# Patient Record
Sex: Male | Born: 1935 | Race: Black or African American | Hispanic: No | Smoking: Former smoker
Health system: Southern US, Community
[De-identification: ages and names within clinical notes are randomized; demographics above are authoritative.]

## PROBLEM LIST (undated history)

## (undated) DIAGNOSIS — G4730 Sleep apnea, unspecified: Secondary | ICD-10-CM

## (undated) DIAGNOSIS — N400 Enlarged prostate without lower urinary tract symptoms: Secondary | ICD-10-CM

## (undated) DIAGNOSIS — R09A2 Foreign body sensation, throat: Secondary | ICD-10-CM

## (undated) DIAGNOSIS — K5909 Other constipation: Secondary | ICD-10-CM

## (undated) DIAGNOSIS — I10 Essential (primary) hypertension: Secondary | ICD-10-CM

## (undated) DIAGNOSIS — I480 Paroxysmal atrial fibrillation: Secondary | ICD-10-CM

## (undated) DIAGNOSIS — N62 Hypertrophy of breast: Secondary | ICD-10-CM

## (undated) DIAGNOSIS — R0989 Other specified symptoms and signs involving the circulatory and respiratory systems: Secondary | ICD-10-CM

## (undated) DIAGNOSIS — N189 Chronic kidney disease, unspecified: Secondary | ICD-10-CM

## (undated) DIAGNOSIS — E669 Obesity, unspecified: Secondary | ICD-10-CM

## (undated) DIAGNOSIS — E785 Hyperlipidemia, unspecified: Secondary | ICD-10-CM

## (undated) DIAGNOSIS — I484 Atypical atrial flutter: Secondary | ICD-10-CM

## (undated) DIAGNOSIS — D649 Anemia, unspecified: Secondary | ICD-10-CM

## (undated) DIAGNOSIS — I495 Sick sinus syndrome: Secondary | ICD-10-CM

## (undated) DIAGNOSIS — K219 Gastro-esophageal reflux disease without esophagitis: Secondary | ICD-10-CM

## (undated) DIAGNOSIS — K3184 Gastroparesis: Secondary | ICD-10-CM

## (undated) DIAGNOSIS — R198 Other specified symptoms and signs involving the digestive system and abdomen: Secondary | ICD-10-CM

## (undated) HISTORY — DX: Benign prostatic hyperplasia without lower urinary tract symptoms: N40.0

## (undated) HISTORY — DX: Hyperlipidemia, unspecified: E78.5

## (undated) HISTORY — DX: Obesity, unspecified: E66.9

## (undated) HISTORY — DX: Sleep apnea, unspecified: G47.30

## (undated) HISTORY — DX: Gastroparesis: K31.84

## (undated) HISTORY — DX: Hypertrophy of breast: N62

## (undated) HISTORY — DX: Sick sinus syndrome: I49.5

## (undated) HISTORY — DX: Anemia, unspecified: D64.9

## (undated) HISTORY — PX: TUMOR REMOVAL: SHX12

## (undated) HISTORY — DX: Essential (primary) hypertension: I10

## (undated) HISTORY — PX: ROTATOR CUFF REPAIR: SHX139

## (undated) HISTORY — DX: Other constipation: K59.09

## (undated) HISTORY — DX: Atypical atrial flutter: I48.4

## (undated) HISTORY — DX: Gastro-esophageal reflux disease without esophagitis: K21.9

## (undated) HISTORY — DX: Paroxysmal atrial fibrillation: I48.0

## (undated) HISTORY — PX: CATARACT EXTRACTION: SUR2

---

## 2001-07-24 HISTORY — PX: CARDIAC CATHETERIZATION: SHX172

## 2003-07-30 ENCOUNTER — Emergency Department (HOSPITAL_COMMUNITY): Admission: EM | Admit: 2003-07-30 | Discharge: 2003-07-30 | Payer: Self-pay | Admitting: Emergency Medicine

## 2003-08-15 ENCOUNTER — Emergency Department (HOSPITAL_COMMUNITY): Admission: EM | Admit: 2003-08-15 | Discharge: 2003-08-15 | Payer: Self-pay | Admitting: Emergency Medicine

## 2004-01-20 ENCOUNTER — Emergency Department (HOSPITAL_COMMUNITY): Admission: EM | Admit: 2004-01-20 | Discharge: 2004-01-21 | Payer: Self-pay | Admitting: Emergency Medicine

## 2004-11-17 ENCOUNTER — Ambulatory Visit: Payer: Self-pay | Admitting: Internal Medicine

## 2004-11-22 ENCOUNTER — Ambulatory Visit: Payer: Self-pay | Admitting: Family Medicine

## 2004-11-29 ENCOUNTER — Ambulatory Visit: Payer: Self-pay | Admitting: Internal Medicine

## 2004-12-13 ENCOUNTER — Ambulatory Visit: Payer: Self-pay | Admitting: Internal Medicine

## 2005-01-02 ENCOUNTER — Ambulatory Visit: Payer: Self-pay | Admitting: Internal Medicine

## 2005-01-10 ENCOUNTER — Ambulatory Visit: Payer: Self-pay | Admitting: Internal Medicine

## 2005-01-16 ENCOUNTER — Ambulatory Visit: Payer: Self-pay | Admitting: Internal Medicine

## 2005-01-18 ENCOUNTER — Ambulatory Visit: Payer: Self-pay | Admitting: Internal Medicine

## 2005-01-20 ENCOUNTER — Ambulatory Visit: Payer: Self-pay | Admitting: Family Medicine

## 2005-01-23 ENCOUNTER — Ambulatory Visit: Payer: Self-pay | Admitting: Internal Medicine

## 2005-01-26 ENCOUNTER — Ambulatory Visit: Payer: Self-pay | Admitting: Internal Medicine

## 2005-01-31 ENCOUNTER — Ambulatory Visit: Payer: Self-pay

## 2005-02-02 ENCOUNTER — Ambulatory Visit: Payer: Self-pay | Admitting: Internal Medicine

## 2005-02-06 ENCOUNTER — Ambulatory Visit: Payer: Self-pay

## 2005-02-06 ENCOUNTER — Ambulatory Visit: Payer: Self-pay | Admitting: Internal Medicine

## 2005-03-02 ENCOUNTER — Ambulatory Visit: Payer: Self-pay | Admitting: Internal Medicine

## 2005-03-06 ENCOUNTER — Ambulatory Visit: Payer: Self-pay | Admitting: Internal Medicine

## 2005-03-13 ENCOUNTER — Ambulatory Visit: Payer: Self-pay | Admitting: Internal Medicine

## 2005-03-17 ENCOUNTER — Ambulatory Visit: Payer: Self-pay | Admitting: Internal Medicine

## 2005-04-03 ENCOUNTER — Ambulatory Visit: Payer: Self-pay | Admitting: Internal Medicine

## 2005-04-05 ENCOUNTER — Ambulatory Visit: Payer: Self-pay | Admitting: Internal Medicine

## 2005-04-06 ENCOUNTER — Ambulatory Visit: Payer: Self-pay | Admitting: Internal Medicine

## 2005-04-10 ENCOUNTER — Ambulatory Visit: Payer: Self-pay | Admitting: Internal Medicine

## 2005-04-10 ENCOUNTER — Ambulatory Visit: Payer: Self-pay | Admitting: Cardiology

## 2005-04-11 ENCOUNTER — Ambulatory Visit: Payer: Self-pay | Admitting: Internal Medicine

## 2005-04-13 ENCOUNTER — Ambulatory Visit: Payer: Self-pay | Admitting: Internal Medicine

## 2005-04-18 ENCOUNTER — Ambulatory Visit: Payer: Self-pay | Admitting: Gastroenterology

## 2005-04-19 ENCOUNTER — Ambulatory Visit: Payer: Self-pay

## 2005-04-21 ENCOUNTER — Ambulatory Visit (HOSPITAL_COMMUNITY): Admission: RE | Admit: 2005-04-21 | Discharge: 2005-04-21 | Payer: Self-pay | Admitting: Gastroenterology

## 2005-04-27 ENCOUNTER — Ambulatory Visit: Payer: Self-pay | Admitting: Internal Medicine

## 2005-05-23 ENCOUNTER — Ambulatory Visit: Payer: Self-pay | Admitting: Gastroenterology

## 2005-05-29 ENCOUNTER — Ambulatory Visit: Payer: Self-pay | Admitting: Internal Medicine

## 2005-06-12 ENCOUNTER — Ambulatory Visit: Payer: Self-pay | Admitting: Internal Medicine

## 2005-06-22 ENCOUNTER — Ambulatory Visit: Payer: Self-pay | Admitting: Internal Medicine

## 2005-06-28 ENCOUNTER — Ambulatory Visit: Payer: Self-pay | Admitting: *Deleted

## 2005-07-05 ENCOUNTER — Ambulatory Visit: Payer: Self-pay | Admitting: Cardiology

## 2005-07-21 ENCOUNTER — Ambulatory Visit: Payer: Self-pay | Admitting: Internal Medicine

## 2005-07-26 ENCOUNTER — Emergency Department (HOSPITAL_COMMUNITY): Admission: EM | Admit: 2005-07-26 | Discharge: 2005-07-26 | Payer: Self-pay | Admitting: Emergency Medicine

## 2005-07-28 ENCOUNTER — Ambulatory Visit: Payer: Self-pay | Admitting: Internal Medicine

## 2005-08-02 ENCOUNTER — Ambulatory Visit: Payer: Self-pay | Admitting: *Deleted

## 2005-08-30 ENCOUNTER — Ambulatory Visit: Payer: Self-pay | Admitting: Cardiology

## 2005-09-27 ENCOUNTER — Ambulatory Visit: Payer: Self-pay | Admitting: Internal Medicine

## 2005-10-09 ENCOUNTER — Ambulatory Visit: Payer: Self-pay | Admitting: Gastroenterology

## 2005-10-11 ENCOUNTER — Ambulatory Visit: Payer: Self-pay | Admitting: Cardiology

## 2005-10-19 ENCOUNTER — Ambulatory Visit: Payer: Self-pay | Admitting: Internal Medicine

## 2005-11-01 ENCOUNTER — Ambulatory Visit: Payer: Self-pay | Admitting: *Deleted

## 2005-11-02 ENCOUNTER — Ambulatory Visit: Payer: Self-pay | Admitting: Internal Medicine

## 2005-11-07 ENCOUNTER — Ambulatory Visit: Payer: Self-pay | Admitting: Internal Medicine

## 2005-11-29 ENCOUNTER — Ambulatory Visit: Payer: Self-pay | Admitting: Cardiology

## 2005-12-04 ENCOUNTER — Ambulatory Visit: Payer: Self-pay

## 2005-12-13 ENCOUNTER — Ambulatory Visit: Payer: Self-pay | Admitting: Cardiology

## 2006-01-10 ENCOUNTER — Ambulatory Visit: Payer: Self-pay | Admitting: *Deleted

## 2006-01-16 ENCOUNTER — Ambulatory Visit: Payer: Self-pay | Admitting: Internal Medicine

## 2006-02-02 ENCOUNTER — Ambulatory Visit: Payer: Self-pay | Admitting: Cardiology

## 2006-02-05 ENCOUNTER — Encounter: Admission: RE | Admit: 2006-02-05 | Discharge: 2006-03-06 | Payer: Self-pay | Admitting: Internal Medicine

## 2006-02-06 ENCOUNTER — Ambulatory Visit: Payer: Self-pay | Admitting: Internal Medicine

## 2006-02-08 ENCOUNTER — Ambulatory Visit: Payer: Self-pay | Admitting: Internal Medicine

## 2006-02-14 ENCOUNTER — Ambulatory Visit: Payer: Self-pay | Admitting: Internal Medicine

## 2006-02-16 ENCOUNTER — Ambulatory Visit: Payer: Self-pay | Admitting: Cardiology

## 2006-03-02 ENCOUNTER — Ambulatory Visit: Payer: Self-pay | Admitting: Cardiology

## 2006-03-07 ENCOUNTER — Encounter: Admission: RE | Admit: 2006-03-07 | Discharge: 2006-04-03 | Payer: Self-pay | Admitting: Internal Medicine

## 2006-03-13 ENCOUNTER — Ambulatory Visit: Payer: Self-pay | Admitting: Cardiology

## 2006-03-27 ENCOUNTER — Ambulatory Visit: Payer: Self-pay | Admitting: Cardiovascular Disease

## 2006-04-04 ENCOUNTER — Encounter: Admission: RE | Admit: 2006-04-04 | Discharge: 2006-05-22 | Payer: Self-pay | Admitting: Internal Medicine

## 2006-04-10 ENCOUNTER — Ambulatory Visit: Payer: Self-pay | Admitting: Cardiology

## 2006-04-17 ENCOUNTER — Ambulatory Visit: Payer: Self-pay | Admitting: Internal Medicine

## 2006-04-26 ENCOUNTER — Ambulatory Visit: Payer: Self-pay | Admitting: Internal Medicine

## 2006-05-01 ENCOUNTER — Ambulatory Visit: Payer: Self-pay | Admitting: Internal Medicine

## 2006-05-14 ENCOUNTER — Ambulatory Visit: Payer: Self-pay | Admitting: Internal Medicine

## 2006-05-22 ENCOUNTER — Ambulatory Visit: Payer: Self-pay | Admitting: Cardiology

## 2006-05-31 ENCOUNTER — Encounter: Payer: Self-pay | Admitting: Cardiology

## 2006-05-31 ENCOUNTER — Ambulatory Visit: Payer: Self-pay

## 2006-06-05 ENCOUNTER — Ambulatory Visit: Payer: Self-pay | Admitting: *Deleted

## 2006-07-03 ENCOUNTER — Ambulatory Visit: Payer: Self-pay | Admitting: Cardiovascular Disease

## 2006-08-03 ENCOUNTER — Ambulatory Visit: Payer: Self-pay | Admitting: Internal Medicine

## 2006-08-06 ENCOUNTER — Ambulatory Visit: Payer: Self-pay | Admitting: Cardiology

## 2006-08-12 ENCOUNTER — Inpatient Hospital Stay (HOSPITAL_COMMUNITY): Admission: EM | Admit: 2006-08-12 | Discharge: 2006-08-18 | Payer: Self-pay | Admitting: Emergency Medicine

## 2006-08-13 ENCOUNTER — Ambulatory Visit: Payer: Self-pay | Admitting: Internal Medicine

## 2006-08-16 DIAGNOSIS — K219 Gastro-esophageal reflux disease without esophagitis: Secondary | ICD-10-CM

## 2006-08-16 DIAGNOSIS — K3184 Gastroparesis: Secondary | ICD-10-CM

## 2006-08-16 DIAGNOSIS — J45909 Unspecified asthma, uncomplicated: Secondary | ICD-10-CM | POA: Insufficient documentation

## 2006-08-16 DIAGNOSIS — R945 Abnormal results of liver function studies: Secondary | ICD-10-CM | POA: Insufficient documentation

## 2006-08-20 ENCOUNTER — Ambulatory Visit: Payer: Self-pay | Admitting: Cardiology

## 2006-08-22 ENCOUNTER — Encounter: Admission: RE | Admit: 2006-08-22 | Discharge: 2006-08-22 | Payer: Self-pay | Admitting: Internal Medicine

## 2006-08-22 ENCOUNTER — Ambulatory Visit: Payer: Self-pay | Admitting: Internal Medicine

## 2006-08-29 ENCOUNTER — Ambulatory Visit: Payer: Self-pay | Admitting: Internal Medicine

## 2006-08-31 ENCOUNTER — Ambulatory Visit: Payer: Self-pay | Admitting: Internal Medicine

## 2006-09-12 ENCOUNTER — Ambulatory Visit: Payer: Self-pay | Admitting: Internal Medicine

## 2006-10-03 ENCOUNTER — Ambulatory Visit: Payer: Self-pay | Admitting: Internal Medicine

## 2006-10-22 ENCOUNTER — Ambulatory Visit: Payer: Self-pay | Admitting: Internal Medicine

## 2006-11-07 ENCOUNTER — Inpatient Hospital Stay (HOSPITAL_COMMUNITY): Admission: AD | Admit: 2006-11-07 | Discharge: 2006-11-20 | Payer: Self-pay | Admitting: Internal Medicine

## 2006-11-07 ENCOUNTER — Ambulatory Visit: Payer: Self-pay | Admitting: Internal Medicine

## 2006-11-08 ENCOUNTER — Ambulatory Visit: Payer: Self-pay | Admitting: Internal Medicine

## 2006-11-09 ENCOUNTER — Ambulatory Visit: Payer: Self-pay | Admitting: Cardiology

## 2006-11-09 ENCOUNTER — Encounter: Payer: Self-pay | Admitting: Cardiology

## 2006-11-16 ENCOUNTER — Encounter: Payer: Self-pay | Admitting: Vascular Surgery

## 2006-11-16 ENCOUNTER — Ambulatory Visit: Payer: Self-pay | Admitting: Vascular Surgery

## 2006-11-20 ENCOUNTER — Ambulatory Visit: Payer: Self-pay | Admitting: Gastroenterology

## 2006-12-11 ENCOUNTER — Encounter: Admission: RE | Admit: 2006-12-11 | Discharge: 2006-12-11 | Payer: Self-pay | Admitting: Internal Medicine

## 2007-01-14 ENCOUNTER — Ambulatory Visit: Payer: Self-pay | Admitting: Internal Medicine

## 2007-01-14 ENCOUNTER — Inpatient Hospital Stay (HOSPITAL_COMMUNITY): Admission: EM | Admit: 2007-01-14 | Discharge: 2007-01-17 | Payer: Self-pay | Admitting: Emergency Medicine

## 2007-04-03 ENCOUNTER — Encounter: Admission: RE | Admit: 2007-04-03 | Discharge: 2007-04-03 | Payer: Self-pay | Admitting: Gastroenterology

## 2007-08-22 ENCOUNTER — Encounter: Payer: Self-pay | Admitting: Gastroenterology

## 2007-08-22 ENCOUNTER — Ambulatory Visit (HOSPITAL_COMMUNITY): Admission: RE | Admit: 2007-08-22 | Discharge: 2007-08-22 | Payer: Self-pay | Admitting: Gastroenterology

## 2007-09-05 ENCOUNTER — Ambulatory Visit (HOSPITAL_COMMUNITY): Admission: RE | Admit: 2007-09-05 | Discharge: 2007-09-05 | Payer: Self-pay | Admitting: Internal Medicine

## 2007-09-17 ENCOUNTER — Ambulatory Visit (HOSPITAL_COMMUNITY): Admission: RE | Admit: 2007-09-17 | Discharge: 2007-09-17 | Payer: Self-pay | Admitting: Gastroenterology

## 2007-12-11 ENCOUNTER — Encounter: Admission: RE | Admit: 2007-12-11 | Discharge: 2007-12-11 | Payer: Self-pay | Admitting: Gastroenterology

## 2008-02-28 ENCOUNTER — Encounter: Admission: RE | Admit: 2008-02-28 | Discharge: 2008-02-28 | Payer: Self-pay | Admitting: Gastroenterology

## 2008-03-19 ENCOUNTER — Encounter: Admission: RE | Admit: 2008-03-19 | Discharge: 2008-03-19 | Payer: Self-pay | Admitting: Gastroenterology

## 2008-06-05 ENCOUNTER — Ambulatory Visit (HOSPITAL_COMMUNITY): Admission: RE | Admit: 2008-06-05 | Discharge: 2008-06-05 | Payer: Self-pay | Admitting: Gastroenterology

## 2008-12-15 ENCOUNTER — Encounter: Admission: RE | Admit: 2008-12-15 | Discharge: 2008-12-15 | Payer: Self-pay | Admitting: Geriatric Medicine

## 2009-02-16 ENCOUNTER — Ambulatory Visit: Payer: Self-pay | Admitting: Vascular Surgery

## 2009-11-26 ENCOUNTER — Ambulatory Visit: Payer: Self-pay | Admitting: Internal Medicine

## 2009-11-26 DIAGNOSIS — D649 Anemia, unspecified: Secondary | ICD-10-CM

## 2009-11-26 DIAGNOSIS — I10 Essential (primary) hypertension: Secondary | ICD-10-CM

## 2009-11-26 DIAGNOSIS — I4891 Unspecified atrial fibrillation: Secondary | ICD-10-CM | POA: Insufficient documentation

## 2009-11-26 DIAGNOSIS — R32 Unspecified urinary incontinence: Secondary | ICD-10-CM | POA: Insufficient documentation

## 2009-11-26 DIAGNOSIS — E785 Hyperlipidemia, unspecified: Secondary | ICD-10-CM | POA: Insufficient documentation

## 2009-11-26 DIAGNOSIS — E119 Type 2 diabetes mellitus without complications: Secondary | ICD-10-CM | POA: Insufficient documentation

## 2009-11-26 DIAGNOSIS — J309 Allergic rhinitis, unspecified: Secondary | ICD-10-CM | POA: Insufficient documentation

## 2009-11-27 ENCOUNTER — Telehealth: Payer: Self-pay | Admitting: Family Medicine

## 2009-11-29 ENCOUNTER — Telehealth: Payer: Self-pay | Admitting: Internal Medicine

## 2009-11-29 LAB — CONVERTED CEMR LAB
CO2: 24 meq/L (ref 19–32)
Calcium: 9 mg/dL (ref 8.4–10.5)
Glucose, Bld: 179 mg/dL — ABNORMAL HIGH (ref 70–99)
Sodium: 138 meq/L (ref 135–145)

## 2009-12-01 ENCOUNTER — Encounter: Payer: Self-pay | Admitting: Internal Medicine

## 2009-12-03 ENCOUNTER — Encounter: Payer: Self-pay | Admitting: Internal Medicine

## 2009-12-08 ENCOUNTER — Encounter: Payer: Self-pay | Admitting: Internal Medicine

## 2009-12-10 ENCOUNTER — Telehealth: Payer: Self-pay | Admitting: Internal Medicine

## 2009-12-10 ENCOUNTER — Encounter: Payer: Self-pay | Admitting: Internal Medicine

## 2009-12-10 LAB — CONVERTED CEMR LAB
INR: 1.54
Prothrombin Time: 19.5 s

## 2009-12-27 ENCOUNTER — Telehealth (INDEPENDENT_AMBULATORY_CARE_PROVIDER_SITE_OTHER): Payer: Self-pay | Admitting: *Deleted

## 2009-12-27 ENCOUNTER — Encounter (INDEPENDENT_AMBULATORY_CARE_PROVIDER_SITE_OTHER): Payer: Self-pay | Admitting: *Deleted

## 2009-12-27 LAB — CONVERTED CEMR LAB: Prothrombin Time: 27.8 s

## 2009-12-28 ENCOUNTER — Telehealth: Payer: Self-pay | Admitting: Internal Medicine

## 2009-12-31 ENCOUNTER — Encounter: Payer: Self-pay | Admitting: Internal Medicine

## 2010-01-05 ENCOUNTER — Encounter: Payer: Self-pay | Admitting: Internal Medicine

## 2010-01-11 ENCOUNTER — Ambulatory Visit: Payer: Self-pay | Admitting: Internal Medicine

## 2010-01-13 LAB — CONVERTED CEMR LAB
CO2: 29 meq/L (ref 19–32)
Calcium: 9 mg/dL (ref 8.4–10.5)
GFR calc non Af Amer: 54.8 mL/min (ref >60)
HDL: 37.3 mg/dL — ABNORMAL LOW (ref >39.00)
Sodium: 142 meq/L (ref 135–145)
Triglycerides: 247 mg/dL — ABNORMAL HIGH (ref 0.0–149.0)

## 2010-01-26 ENCOUNTER — Encounter: Payer: Self-pay | Admitting: Internal Medicine

## 2010-02-01 ENCOUNTER — Encounter: Payer: Self-pay | Admitting: Internal Medicine

## 2010-02-02 ENCOUNTER — Encounter: Payer: Self-pay | Admitting: Internal Medicine

## 2010-02-17 ENCOUNTER — Encounter: Payer: Self-pay | Admitting: Family Medicine

## 2010-02-28 ENCOUNTER — Telehealth: Payer: Self-pay | Admitting: Internal Medicine

## 2010-03-01 ENCOUNTER — Ambulatory Visit: Payer: Self-pay | Admitting: Internal Medicine

## 2010-03-04 LAB — CONVERTED CEMR LAB
ALT: 23 units/L (ref 0–53)
Hgb A1c MFr Bld: 6.8 % — ABNORMAL HIGH (ref 4.6–6.5)

## 2010-03-17 ENCOUNTER — Encounter: Payer: Self-pay | Admitting: Internal Medicine

## 2010-03-21 ENCOUNTER — Telehealth: Payer: Self-pay | Admitting: Internal Medicine

## 2010-03-22 ENCOUNTER — Encounter: Payer: Self-pay | Admitting: Internal Medicine

## 2010-03-23 ENCOUNTER — Telehealth: Payer: Self-pay | Admitting: Internal Medicine

## 2010-04-01 ENCOUNTER — Encounter: Payer: Self-pay | Admitting: Internal Medicine

## 2010-04-01 LAB — CONVERTED CEMR LAB
INR: 2.04 — ABNORMAL HIGH (ref <1.50)
Prothrombin Time: 23.2 s — ABNORMAL HIGH (ref 11.6–15.2)

## 2010-04-08 ENCOUNTER — Encounter: Payer: Self-pay | Admitting: Internal Medicine

## 2010-04-12 ENCOUNTER — Encounter: Payer: Self-pay | Admitting: Internal Medicine

## 2010-04-26 ENCOUNTER — Telehealth: Payer: Self-pay | Admitting: Internal Medicine

## 2010-05-02 ENCOUNTER — Telehealth (INDEPENDENT_AMBULATORY_CARE_PROVIDER_SITE_OTHER): Payer: Self-pay | Admitting: *Deleted

## 2010-05-04 ENCOUNTER — Encounter: Payer: Self-pay | Admitting: Internal Medicine

## 2010-05-05 ENCOUNTER — Telehealth: Payer: Self-pay | Admitting: Family Medicine

## 2010-05-20 ENCOUNTER — Encounter: Payer: Self-pay | Admitting: Internal Medicine

## 2010-05-20 ENCOUNTER — Telehealth: Payer: Self-pay | Admitting: Internal Medicine

## 2010-05-30 ENCOUNTER — Encounter: Payer: Self-pay | Admitting: Internal Medicine

## 2010-05-31 ENCOUNTER — Ambulatory Visit: Payer: Self-pay | Admitting: Internal Medicine

## 2010-05-31 DIAGNOSIS — J209 Acute bronchitis, unspecified: Secondary | ICD-10-CM | POA: Insufficient documentation

## 2010-06-02 ENCOUNTER — Encounter: Payer: Self-pay | Admitting: Internal Medicine

## 2010-06-14 ENCOUNTER — Encounter: Payer: Self-pay | Admitting: Internal Medicine

## 2010-06-15 ENCOUNTER — Encounter: Payer: Self-pay | Admitting: Internal Medicine

## 2010-06-21 ENCOUNTER — Telehealth: Payer: Self-pay | Admitting: Internal Medicine

## 2010-06-21 ENCOUNTER — Ambulatory Visit: Payer: Self-pay | Admitting: Internal Medicine

## 2010-06-21 DIAGNOSIS — H04129 Dry eye syndrome of unspecified lacrimal gland: Secondary | ICD-10-CM | POA: Insufficient documentation

## 2010-07-14 ENCOUNTER — Encounter: Payer: Self-pay | Admitting: Internal Medicine

## 2010-07-19 ENCOUNTER — Telehealth: Payer: Self-pay | Admitting: Internal Medicine

## 2010-08-09 ENCOUNTER — Telehealth: Payer: Self-pay | Admitting: Internal Medicine

## 2010-08-11 ENCOUNTER — Encounter: Payer: Self-pay | Admitting: Internal Medicine

## 2010-08-14 ENCOUNTER — Encounter: Payer: Self-pay | Admitting: *Deleted

## 2010-08-14 ENCOUNTER — Encounter: Payer: Self-pay | Admitting: Gastroenterology

## 2010-08-16 ENCOUNTER — Encounter: Payer: Self-pay | Admitting: Internal Medicine

## 2010-08-16 ENCOUNTER — Ambulatory Visit
Admission: RE | Admit: 2010-08-16 | Discharge: 2010-08-16 | Payer: Self-pay | Source: Home / Self Care | Attending: Internal Medicine | Admitting: Internal Medicine

## 2010-08-16 LAB — CONVERTED CEMR LAB: INR: 1.9

## 2010-08-17 ENCOUNTER — Encounter: Payer: Self-pay | Admitting: Internal Medicine

## 2010-08-18 LAB — CONVERTED CEMR LAB: Prothrombin Time: 18.5 s — ABNORMAL HIGH (ref 11.6–15.2)

## 2010-08-23 NOTE — Medication Information (Signed)
Summary: Request to Replace Doxazosin with Tamsulosin/Pharmacy Consultant  Request to Replace Doxazosin with Tamsulosin/Pharmacy Consultants   Imported By: Lanelle Bal 01/11/2010 09:05:57  _____________________________________________________________________  External Attachment:    Type:   Image     Comment:   External Document

## 2010-08-23 NOTE — Progress Notes (Signed)
Summary: call a nurse  Phone Note Other Incoming   Summary of Call: Call-A-Nurse Triage Call Report Triage Record Num: [id number] Operator: Geanie Berlin Patient Name: Tyler [name] Call Date & Time: 11/27/2009 5:23:59PM Patient Phone: [phone] PCP: Florentina Jenny (Physician Home Patient Gender: Male PCP Fax : [phone] Patient DOB: Jul 13, 1934 Practice Name: [facility] Reason for Call: 77 yo. Elease Hashimoto, Med tech with [facility] calling re blood sugar [phone], 59 at 1700 & 421 at 1710, Ambulance arrived before triage initiated & currently with pt. Blood sugar 367 per paramedic 1725 and 301 now at 1740. BP 142/68. Last insulin was ac lunch. Began Keflex 11/27/09. Facility Med tech unable to take phone orders. Administrator advised willl send to hospital. Protocol(s) Used: Diabetes: Out of Control Recommended Outcome per Protocol: See Provider within 4 hours Override Outcome if Used in Protocol: Activate EMS 911 RN Reason for Override Outcome: Per Caller Request. Reason for Outcome: New or increasing symptoms as defined by provider or action plan AND not taking medications/following therapy, or change in medication or therapy in last 72 hours Care Advice:  ~ SYMPTOM / CONDITION MANAGEMENT 11/27/2009 5:49:36PM Page 1 of 1 [email or web address]  Follow-up for Phone Call        please see fyi call a nurse note; spoke with [facility] med tech this am. .Kandice Hams  Nov 29, 2009 9:30 AM  Follow-up by: Kandice Hams,  Nov 29, 2009 9:30 AM  Additional Follow-up for Phone Call Additional follow up Details #1::        noted has labile DM, no change for now   Additional Follow-up by: [facility] E. Destry Bezdek MD,  Nov 29, 2009 1:06 PM

## 2010-08-23 NOTE — Progress Notes (Signed)
Summary: INR 2.3, no change, 4 weeks  Phone Note Outgoing Call Call back at [phone]   Summary of Call: INR was checked 6 -3 -11 ----> 2.3 Call the patient's ALF: continue with the same Coumadin, recheck in 4 weeks   Follow-up for Phone Call        left message to call  office..........Marland KitchenFelecia Deloach CMA  December 27, 2009 3:11 PM   order faxed, judy called back to confirmed order received.Marland KitchenMarland KitchenMarland KitchenFelecia Deloach CMA  December 28, 2009 10:22 AM      Laboratory Results   Blood Tests     PT: 27.8 s   (Normal Range: 10.6-13.4)  INR: 2.34   (Normal Range: 0.88-1.12   Therap INR: 2.0-3.5)

## 2010-08-23 NOTE — Miscellaneous (Signed)
Summary: Nostril Tender/[facility] [place]  [facility]ril Tender/[facility] [place]   Imported By: Lanelle Bal 03/24/2010 09:55:31  _____________________________________________________________________  External Attachment:    Type:   Image     Comment:   External Document

## 2010-08-23 NOTE — Miscellaneous (Signed)
Summary: Med Orders/[facility]  Med Orders/[facility]   Imported By: Lanelle Bal 02/25/2010 08:57:48  _____________________________________________________________________  External Attachment:    Type:   Image     Comment:   External Document

## 2010-08-23 NOTE — Miscellaneous (Signed)
Summary: OT Care Plan/[facility]  OT Care Plan/[facility]    Imported By: Lanelle Bal 01/11/2010 09:03:52  _____________________________________________________________________  External Attachment:    Type:   Image     Comment:   External Document

## 2010-08-23 NOTE — Miscellaneous (Signed)
Summary: OT Discharge/[facility]  OT Discharge/[facility] [place]   Imported By: Lanelle Bal 02/09/2010 10:29:36  _____________________________________________________________________  External Attachment:    Type:   Image     Comment:   External Document

## 2010-08-23 NOTE — Medication Information (Signed)
Summary: Zyrtec Change/[facility]  Zyrtec Change/[facility]   Imported By: Lanelle Bal 01/07/2010 10:08:38  _____________________________________________________________________  External Attachment:    Type:   Image     Comment:   External Document

## 2010-08-23 NOTE — Progress Notes (Signed)
Summary: PT results ([name] pt--[name] out of office)   Phone Note Outgoing Call   Summary of Call: please find out exact Coumadin dose Tyler E. Paz MD  May 05, 2010 12:38 PM   Follow-up for Phone Call        Pt is taking 7mg  daily-- pts PT was 1.97 (lab report in computer)  Follow-up by: Army Fossa CMA,  May 05, 2010 1:17 PM  Additional Follow-up for Phone Call Additional follow up Details #1::        Pt did not miss any doses or change diet?  if not ---take extra 1/2 tab today----then resume and recheck 2 weeks. Additional Follow-up by: Loreen Freud DO,  May 05, 2010 2:26 PM    Additional Follow-up for Phone Call Additional follow up Details #2::    I spoke with [name] his [facility] nurse- she is aware will make sure pt gets 1/2 tab today. Has not missed any pills or changed diet. Will fax orders to [facility] company. Army Fossa CMA  May 05, 2010 2:34 PM   Additional Follow-up for Phone Call Additional follow up Details #3:: Details for Additional Follow-up Action Taken: Pt has 4mg  tabs and 3mg  tabs, he combines them to make 7mg . Which tab should he split?  Please advise. Army Fossa CMA  May 05, 2010 3:57 PM  Just take extra 3 mg tab today.   yrlowne  05/05/2010 4pm  aware, and order to sent to [facility]. Army Fossa CMA  May 05, 2010 4:01 PM   New/Updated Medications: * PT ORDERS Take extra 1/2 tab today of Coumadin then resume normal dosage. Recheck in 2 weeks. * PT ORDERS Take extra 3mg  tab today, resume normal dosing tomorrow-- recheck in 2 weeks. Prescriptions: PT ORDERS Take extra 3mg  tab today, resume normal dosing tomorrow-- recheck in 2 weeks.  #1 x 0   Entered by:   Army Fossa CMA   Authorized by:   Loreen Freud DO   Signed by:   Army Fossa CMA on 05/05/2010   Method used:   Print then Give to Patient   RxID:   [id number] PT ORDERS Take extra 1/2 tab today of Coumadin then resume normal dosage. Recheck in 2 weeks.  #1 x 0   Entered  by:   Army Fossa CMA   Authorized by:   Loreen Freud DO   Signed by:   Army Fossa CMA on 05/05/2010   Method used:   Print then Give to Patient   RxID:   [id number]

## 2010-08-23 NOTE — Assessment & Plan Note (Signed)
Summary: 2 MONTH FOLLOWUP//KN   Vital Signs:  Patient profile:   77 year old male Weight:      260.13 pounds Pulse rate:   129 / minute Pulse rhythm:   regular BP sitting:   138 / 76  (left arm) Cuff size:   large  Vitals Entered By: Army Fossa CMA (March 01, 2010 1:25 PM) CC: Pt here for f/u visit, had breakfast at 8 am. no lunch   History of Present Illness:  here with his daughter Feeling well No problems to report  Current Medications (verified): 1)  Lantus 100 Unit/ml Soln (Insulin Glargine) .... 75 Units 2)  Novolog 100 Unit/ml Soln (Insulin Aspart) .... Ss 110-150 - 5u 151-200 - 8u 201-250 12u 251-300 16u 301 -350 18u 351-400 22u >400 Call Md 3)  Metformin Hcl 750 Mg Xr24h-Tab (Metformin Hcl) .Marland Kitchen.. 1 By Mouth Qam 4)  Pepto-Bismol 262 Mg/45ml Susp (Bismuth Subsalicylate) .... As Directed, Prn 5)  Glutose 15 40 % Gel (Dextrose (Diabetic Use)) .... Give 1 Tube If Cbg <60 6)  Torsemide 100 Mg Tabs (Torsemide) .... 1/2 By Mouth Once Daily 7)  Carvedilol 6.25 Mg Tabs (Carvedilol) .... Two Times A Day 8)  Spironolactone 25 Mg Tabs (Spironolactone) .... Two Times A Day 9)  Zocor 20 Mg Tabs (Simvastatin) .Marland Kitchen.. 1 By Mouth At Bedtime. 10)  Sertraline Hcl 25 Mg Tabs (Sertraline Hcl) .Marland Kitchen.. 1 By Mouth Once Daily 11)  Donepezil Hcl 10 Mg Tabs (Donepezil Hcl) .Marland Kitchen.. 1 By Mouth At Bedtime 12)  Vitamin D (Ergocalciferol) 50000 Unit Caps (Ergocalciferol) .Marland Kitchen.. 1 By Mouth Every Friday 13)  Ferrous Sulfate 325 (65 Fe) Mg Tabs (Ferrous Sulfate) .Marland Kitchen.. 1 By Mouth Qam 14)  Pantoprazole Sodium 40 Mg Tbec (Pantoprazole Sodium) .... One Twice A Day On A  Empty Stomach 15)  Multi-Vitamin .... Once Daily 16)  Advair Diskus 100-50 Mcg/dose Aepb (Fluticasone-Salmeterol) .... Two Times A Day 17)  Doxazosin Mesylate 4 Mg Tabs (Doxazosin Mesylate) .Marland Kitchen.. 1 By Mouth At Bedtime 18)  Finasteride 5 Mg Tabs (Finasteride) .Marland Kitchen.. 1 By Mouth Qd 19)  Ventolin Hfa 108 (90 Base) Mcg/act Aers (Albuterol Sulfate) .... 2  Puffs Every 4 Hours As Needed For Sob or Wheezing 20)  Simethicone 80 Mg Chew (Simethicone) .... Qid 21)  Astepro 0.15 % Soln (Azelastine Hcl) .... Two Sprays On Each Side of The Nose Twice A Day 22)  Cetirizine Hcl 10 Mg Tabs (Cetirizine Hcl) .Marland Kitchen.. 1 By Mouth Qhs 23)  Patanol 0.1 % Soln (Olopatadine Hcl) .Marland Kitchen.. 1 Gtt As Needed For Itching 24)  Senna-Plus 8.6-50 Mg Tabs (Sennosides-Docusate Sodium) .... 2 By Mouth At Bedtime 25)  Coumadin 7mg  .... Once Daily 26)  Miralax  Powd (Polyethylene Glycol 3350) .Marland Kitchen.. 17gms By Mouth Once Daily As Needed Constipation 27)  Coricidin Hbp Cough/cold 4-30 Mg Tabs (Chlorpheniramine-Dm) .Marland Kitchen.. 1 By Mouth Every 12 Hours As Needed Cough & Congestion 28)  Acetaminophen 160 Mg/30ml Susp (Acetaminophen) .Marland Kitchen.. 10ml Every 4 Hours As Needed Pain  Allergies (verified): No Known Drug Allergies  Past History:  Past Medical History: Reviewed history from 01/11/2010 and no changes required. Diabetes mellitus, type II (1980) Hyperlipidemia (1980) Hypertension (1980) Asthma Allergic rhinitis GERD Benign prostatic hypertrophy s/p ?thermo therapy, has bladdero utlet obst. and instability ( incontinent) Anemia-NOS chronic constipation Chronic increased alkaline phosphate ------------------------------------------------------------------- Cardiac history is extensive. see also past surgical history sick sinus syndrome  Paroxysmal atrial fibrillation status post direct cardioversion normal cardiac catheterization in 2003 with normal  left ventricular  function (done after an abnormal Cardiolite)  history of severe  bradycardia on high dose AV-nodal blockers.   chronic bifascicular  block -------------------------------------------------------------------- Morbid obesity. sleep apnea with noncompliance with CPAP h/o  hypoxia in the past, likely multifactorial Gastroparesis diagnosed in 2006 through gastric scan. gynecomastia, normal mammograms 11/2008    Past Surgical  History: Reviewed history from 11/26/2009 and no changes required. Cataract extraction Rotator cuff repair (R) negative colonoscopy 07/2003 normal cardiac catheterization in 2003 with normal  left ventricular function (done after an abnormal Cardiolite) negative Cardiolite 05/2004 normal ABIs 11-2005 low risk Cardiolite 05/2006  Review of Systems        ambulatory blood sugars -in the morning 107, 96, 198 -before lunch 200, 151, 286 Ambulatory BPs around 120-60 Patient denies chest pain, shortness of breath No recent falls Appetite within normal daughter reports no evidence of anxiety or depression, he remains mentally sharp   Physical Exam  General:  alert, well-developed, and overweight-appearing.   Lungs:  normal respiratory effort, no intercostal retractions, and no accessory muscle use.  slightly decreased breath sounds Heart:  iregular rate and rhythm, no murmur Extremities:  no edema Psych:  Oriented X3, good eye contact, not anxious appearing, and not depressed appearing.     Impression & Recommendations:  Problem # 1:  * END-OF-LIFE discussed with the patient end-of-life issues He does not have a formal living will He states that if needed  (and if there is a chance of recovery) he would take CPR and other treatments as needed. He does not desire to be " keep alive artificially" Advised to get a formal statement about his wishes  Problem # 2:  HYPERTENSION (ICD-401.9) at goal  His updated medication list for this problem includes:    Torsemide 100 Mg Tabs (Torsemide) .Marland Kitchen... 1/2 by mouth once daily    Carvedilol 6.25 Mg Tabs (Carvedilol) .Marland Kitchen..Marland Kitchen Two times a day    Spironolactone 25 Mg Tabs (Spironolactone) .Marland Kitchen..Marland Kitchen Two times a day    Doxazosin Mesylate 4 Mg Tabs (Doxazosin mesylate) .Marland Kitchen... 1 by mouth at bedtime  BP today: 138/76 Prior BP: 120/76 (01/11/2010)  Labs Reviewed: K+: 4.4 (01/11/2010) Creat: : 1.6 (01/11/2010)   Chol: 195 (01/11/2010)   HDL: 37.30  (01/11/2010)   TG: 247.0 (01/11/2010)  Problem # 3:  DM (ICD-250.00) ambulatory CBGs  slightly high in the afternoon but the last A1c was normal. No change His updated medication list for this problem includes:    Lantus 100 Unit/ml Soln (Insulin glargine) .Marland KitchenMarland KitchenMarland KitchenMarland Kitchen 75 units    Novolog 100 Unit/ml Soln (Insulin aspart) ..... Ss 110-150 - 5u 151-200 - 8u 201-250 12u 251-300 16u 301 -350 18u 351-400 22u >400 call md    Metformin Hcl 750 Mg Xr24h-tab (Metformin hcl) .Marland Kitchen... 1 by mouth qam    Glutose 15 40 % Gel (Dextrose (diabetic use)) .Marland Kitchen... Give 1 tube if cbg <60  Labs Reviewed: Creat: 1.6 (01/11/2010)    Reviewed HgBA1c results: 6.8 (11/26/2009)  Orders: TLB-A1C / Hgb A1C (Glycohemoglobin) (83036-A1C) Specimen Handling (16109)  Problem # 4:  HYPERLIPIDEMIA (ICD-272.4) base on  last FLP, simvastatin was increased from 10 to 20 tolerates well His updated medication list for this problem includes:    Zocor 20 Mg Tabs (Simvastatin) .Marland Kitchen... 1 by mouth at bedtime.  Labs Reviewed: SGOT: 19 (11/26/2009)   SGPT: 17 (11/26/2009)   HDL:37.30 (01/11/2010)  Chol:195 (01/11/2010)  Trig:247.0 (01/11/2010)  Orders: Venipuncture (60454) TLB-ALT (SGPT) (84460-ALT) TLB-AST (SGOT) (84450-SGOT) Specimen Handling (09811)  Complete Medication  List: 1)  Lantus 100 Unit/ml Soln (Insulin glargine) .... 75 units 2)  Novolog 100 Unit/ml Soln (Insulin aspart) .... Ss 110-150 - 5u 151-200 - 8u 201-250 12u 251-300 16u 301 -350 18u 351-400 22u >400 call md 3)  Metformin Hcl 750 Mg Xr24h-tab (Metformin hcl) .Marland Kitchen.. 1 by mouth qam 4)  Pepto-bismol 262 Mg/1ml Susp (Bismuth subsalicylate) .... As directed, prn 5)  Glutose 15 40 % Gel (Dextrose (diabetic use)) .... Give 1 tube if cbg <60 6)  Torsemide 100 Mg Tabs (Torsemide) .... 1/2 by mouth once daily 7)  Carvedilol 6.25 Mg Tabs (Carvedilol) .... Two times a day 8)  Spironolactone 25 Mg Tabs (Spironolactone) .... Two times a day 9)  Zocor 20 Mg Tabs (Simvastatin) .Marland Kitchen..  1 by mouth at bedtime. 10)  Sertraline Hcl 25 Mg Tabs (Sertraline hcl) .Marland Kitchen.. 1 by mouth once daily 11)  Donepezil Hcl 10 Mg Tabs (Donepezil hcl) .Marland Kitchen.. 1 by mouth at bedtime 12)  Vitamin D (ergocalciferol) 50000 Unit Caps (Ergocalciferol) .Marland Kitchen.. 1 by mouth every friday 13)  Ferrous Sulfate 325 (65 Fe) Mg Tabs (Ferrous sulfate) .Marland Kitchen.. 1 by mouth qam 14)  Pantoprazole Sodium 40 Mg Tbec (Pantoprazole sodium) .... One twice a day on a  empty stomach 15)  Multi-vitamin  .... Once daily 16)  Advair Diskus 100-50 Mcg/dose Aepb (Fluticasone-salmeterol) .... Two times a day 17)  Doxazosin Mesylate 4 Mg Tabs (Doxazosin mesylate) .Marland Kitchen.. 1 by mouth at bedtime 18)  Finasteride 5 Mg Tabs (Finasteride) .Marland Kitchen.. 1 by mouth qd 19)  Ventolin Hfa 108 (90 Base) Mcg/act Aers (Albuterol sulfate) .... 2 puffs every 4 hours as needed for sob or wheezing 20)  Simethicone 80 Mg Chew (Simethicone) .... Qid 21)  Astepro 0.15 % Soln (Azelastine hcl) .... Two sprays on each side of the nose twice a day 22)  Cetirizine Hcl 10 Mg Tabs (Cetirizine hcl) .Marland Kitchen.. 1 by mouth qhs 23)  Patanol 0.1 % Soln (Olopatadine hcl) .Marland Kitchen.. 1 gtt as needed for itching 24)  Senna-plus 8.6-50 Mg Tabs (Sennosides-docusate sodium) .... 2 by mouth at bedtime 25)  Coumadin 7mg   .... Once daily 26)  Miralax Powd (Polyethylene glycol 3350) .Marland Kitchen.. 17gms by mouth once daily as needed constipation 27)  Coricidin Hbp Cough/cold 4-30 Mg Tabs (Chlorpheniramine-dm) .Marland Kitchen.. 1 by mouth every 12 hours as needed cough & congestion 28)  Acetaminophen 160 Mg/37ml Susp (Acetaminophen) .Marland Kitchen.. 10ml every 4 hours as needed pain  Patient Instructions: 1)  continue with the same medicines as before 2)  Next Coumadin check approximately 9-8- 11 3)  come back in 3 months, fasting

## 2010-08-23 NOTE — Miscellaneous (Signed)
Summary: Tyler [name] Communication form/[facility] [place]  Tyler [name] Communication form/[facility] [place]   Imported By: Sherian Rein 03/31/2010 14:39:51  _____________________________________________________________________  External Attachment:    Type:   Image     Comment:   External Document

## 2010-08-23 NOTE — Miscellaneous (Signed)
Summary: OT Orders  OT Orders   Imported By: Lanelle Bal 12/06/2009 13:03:28  _____________________________________________________________________  External Attachment:    Type:   Image     Comment:   External Document

## 2010-08-23 NOTE — Miscellaneous (Signed)
Summary: Glucose Log & Medication Record/[facility] [place]  Glucose Log & Medication Record/[facility] [place]   Imported By: Lanelle Bal 12/03/2009 10:45:50  _____________________________________________________________________  External Attachment:    Type:   Image     Comment:   External Document

## 2010-08-23 NOTE — Progress Notes (Signed)
Summary: Concerns for todays appt  Phone Note Call from Patient   Caller: Daughter Summary of Call: Patient daughter left message on triage that she cannot accompany the patient to his appt today and has the following concerns: 1. They patient needs a better eye drop for his excessively dry eye (regular drops are not helping) 2. He has a chest cold that is lingering, ?Additional ABX needed 3. He has terrible acid reflux, currently on Protonix. Possibly need alternate prescription. 4. He will not be fasting for labs, they will have to be re-scheduled Number left on vm was [phone] , I do not know what the last digit is at this time. Initial call taken by: Lucious Groves CMA,  June 21, 2010 8:27 AM  Follow-up for Phone Call        see OV note  Follow-up by: [facility] E. Paz MD,  June 21, 2010 2:24 PM

## 2010-08-23 NOTE — Progress Notes (Signed)
Summary: BS elevated  Phone Note From Other Clinic Call back at [phone]   [facility] [place] Summary of Call: pt BS 478. need stat orde. pt currently taking LANTUS 100 UNIT/ML SOLN 75 unit and NOVOLOG 100 UNIT/ML SOLN SS 110-150 - 5u 151-200 - 8u 201-250 12u 251-300 16u 301 -350 18u 351-400 22u >400 call MD..................Marland KitchenFelecia Deloach CMA  December 28, 2009 3:40 PM   Follow-up for Phone Call        give 22u of novolog (if not done already) Negley E. Avamarie Crossley MD  December 28, 2009 4:24 PM   pt already  given 22u. .................Marland KitchenFelecia Deloach CMA  December 28, 2009 4:31 PM

## 2010-08-23 NOTE — Assessment & Plan Note (Signed)
Summary: fu coming out assisted living/kdc   Vital Signs:  Patient profile:   77 year old male Weight:      261.4 pounds O2 Sat:      95 % on Room air Pulse rate:   106 / minute BP sitting:   104 / 56  Vitals Entered By: Shary Decamp (Nov 26, 2009 2:49 PM)  O2 Flow:  Room air CC: to re-est Is Patient Diabetic? Yes Comments Patient has been in assisted living the past 3 years.  Patient is unhappy with the care of the MD.  Patient is still @ [place] but request [facility] for PCP  -- c/o of dry mouth  -- diff swallowing  -- allergy sxs Shary Decamp  Nov 26, 2009 2:51 PM    History of Present Illness: last seen more than 3 years ago here to get reestablished Patient is still @ [facility] but requests [place]  to be his PCP has not seen a speacialist  in long time, years  diabetes: sugars are low in the morning, sometimes in the 50s and as the day goes by they go as  high as 250 but they are quite variable  Hypertension: Well controlled per patient  urinary incontinence: Still a problem , using pullups  history of hypoxia, not currently on oxygen  GERD: On omeprazole 40 mg daily, symptoms is still a problem for the patient     Current Medications (verified): 1)  Novolog 100 Unit/ml Soln (Insulin Aspart) .... Ss 110-150 - 5u 151-200 - 8u 201-250 12u 251-300 16u 301 -350 18u 351-400 22u >400 Call Md 2)  Pepto-Bismol 262 Mg/53ml Susp (Bismuth Subsalicylate) .... As Directed, Prn 3)  Glutose 15 40 % Gel (Dextrose (Diabetic Use)) .... Give 1 Tube If Cbg <60 4)  Vitamin D (Ergocalciferol) 50000 Unit Caps (Ergocalciferol) .Marland Kitchen.. 1 By Mouth Every Friday 5)  Ferrous Sulfate 325 (65 Fe) Mg Tabs (Ferrous Sulfate) .Marland Kitchen.. 1 By Mouth Qam 6)  Fluticasone Propionate 50 Mcg/act Susp (Fluticasone Propionate) .... 2 Sprays Each Nostril Qam 7)  Metformin Hcl 750 Mg Xr24h-Tab (Metformin Hcl) .Marland Kitchen.. 1 By Mouth Qam 8)  Omeprazole 40 Mg Cpdr (Omeprazole) .Marland Kitchen.. 1 By Mouth Once Daily 9)  Sertraline Hcl 25 Mg  Tabs (Sertraline Hcl) .Marland Kitchen.. 1 By Mouth Once Daily 10)  Multi-Vitamin .... Once Daily 11)  Torsemide 100 Mg Tabs (Torsemide) .... 1/2 By Mouth Once Daily 12)  Advair Diskus 100-50 Mcg/dose Aepb (Fluticasone-Salmeterol) .... Two Times A Day 13)  Carvedilol 6.25 Mg Tabs (Carvedilol) .... Two Times A Day 14)  Spironolactone 25 Mg Tabs (Spironolactone) .... Two Times A Day 15)  Simethicone 80 Mg Chew (Simethicone) .... Qid 16)  Simvastatin 10 Mg Tabs (Simvastatin) .Marland Kitchen.. 1 By Mouth Qhs 17)  Cetirizine Hcl 10 Mg Tabs (Cetirizine Hcl) .Marland Kitchen.. 1 By Mouth Qhs 18)  Donepezil Hcl 10 Mg Tabs (Donepezil Hcl) .Marland Kitchen.. 1 By Mouth At Bedtime 19)  Doxazosin Mesylate 4 Mg Tabs (Doxazosin Mesylate) .Marland Kitchen.. 1 By Mouth At Bedtime 20)  Finasteride 5 Mg Tabs (Finasteride) .Marland Kitchen.. 1 By Mouth Qd 21)  Senna-Plus 8.6-50 Mg Tabs (Sennosides-Docusate Sodium) .... 2 By Mouth At Bedtime 22)  Coumadin 7mg  .... Once Daily 23)  Miralax  Powd (Polyethylene Glycol 3350) .Marland Kitchen.. 17gms By Mouth Once Daily As Needed Constipation 24)  Coricidin Hbp Cough/cold 4-30 Mg Tabs (Chlorpheniramine-Dm) .Marland Kitchen.. 1 By Mouth Every 12 Hours As Needed Cough & Congestion 25)  Patanol 0.1 % Soln (Olopatadine Hcl) .Marland Kitchen.. 1 Gtt As Needed  For Itching 26)  Acetaminophen 160 Mg/44ml Susp (Acetaminophen) .Marland Kitchen.. 10ml Every 4 Hours As Needed Pain 27)  Ventolin Hfa 108 (90 Base) Mcg/act Aers (Albuterol Sulfate) .... 2 Puffs Every 4 Hours As Needed For Sob or Wheezing 28)  Lantus 100 Unit/ml Soln (Insulin Glargine) .... 32 Units in The Am, 60 Units At Bedtime  Allergies (verified): No Known Drug Allergies  Past History:  Past Medical History: Diabetes mellitus, type II (1980) Hyperlipidemia (1980) Hypertension (1980) Asthma Allergic rhinitis GERD Benign prostatic hypertrophy s/p ?thermo therapy, has bladdero utlet obst. and instability ( incontinent) Anemia-NOS chronic constipation Chronic increased alkaline  phosphate ------------------------------------------------------------------- Cardiac history is extensive. see also past surgical history sick sinus syndrome  Paroxysmal atrial fibrillation status post direct cardioversion normal cardiac catheterization in 2003 with normal  left ventricular function (done after an abnormal Cardiolite)  history of severe  bradycardia on high dose AV-nodal blockers.   chronic bifascicular  block -------------------------------------------------------------------- Morbid obesity. sleep apnea with noncompliance with CPAP h/o  hypoxia in the past, likely multifactorial Gastroparesis diagnosed in 2006 through gastric scan. gynecomastia, normal mammograms 11/2008    Past Surgical History: Cataract extraction Rotator cuff repair (R) negative colonoscopy 07/2003 normal cardiac catheterization in 2003 with normal  left ventricular function (done after an abnormal Cardiolite) negative Cardiolite 05/2004 normal ABIs 11-2005 low risk Cardiolite 05/2006  Family History: colon ca--no prostate ca-- uncle   Social History: has lived in a facility x aprox 4 years Moved to [place] 08-2008 widower children -- 2 daughters tobacco-- remotely ETOH-- not in a while Does not have a POA  Does not have a living will [place], daughter, [phone]  Review of Systems ENT:  (+) sinus congestion . CV:  occasionally sharp upper CP, last seconds . Resp:  SOB stable, does breath a lot through the nose . GI:  Denies bloody stools, nausea, and vomiting; (+) heartburn .  Physical Exam  General:  alert, well-developed, and overweight-appearing.   Nose:  slightly congested Lungs:  normal respiratory effort, no intercostal retractions, and no accessory muscle use.  slightly decreased breath sounds Heart:  iregular rate and rhythm, no murmur Abdomen:  soft, no masses, no guarding, and no rigidity.  mild diffuse tenderness Extremities:  no edema Psych:  Oriented X3, not anxious  appearing, and not depressed appearing.     Impression & Recommendations:  Problem # 1:  HYPERTENSION (ICD-401.9) slight low, no change His updated medication list for this problem includes:    Torsemide 100 Mg Tabs (Torsemide) .Marland Kitchen... 1/2 by mouth once daily    Carvedilol 6.25 Mg Tabs (Carvedilol) .Marland Kitchen..Marland Kitchen Two times a day    Spironolactone 25 Mg Tabs (Spironolactone) .Marland Kitchen..Marland Kitchen Two times a day    Doxazosin Mesylate 4 Mg Tabs (Doxazosin mesylate) .Marland Kitchen... 1 by mouth at bedtime  BP today: 104/56  Orders: Venipuncture (81191)  Problem # 2:  HYPERLIPIDEMIA (ICD-272.4)  check LFTs His updated medication list for this problem includes:    Simvastatin 10 Mg Tabs (Simvastatin) .Marland Kitchen... 1 by mouth qhs  Problem # 3:  DM (ICD-250.00) complaining of morning hypoglycemia, current Lantus dosing is 32 in the morning and 60 at night Will consolidate lantus to 80u every night. sliding scale unchanged labs His updated medication list for this problem includes:    Lantus 100 Unit/ml Soln (Insulin glargine) .Marland KitchenMarland KitchenMarland KitchenMarland Kitchen 80 units at bedtime    Novolog 100 Unit/ml Soln (Insulin aspart) ..... Ss 110-150 - 5u 151-200 - 8u 201-250 12u 251-300 16u 301 -350 18u 351-400 22u >400  call md    Metformin Hcl 750 Mg Xr24h-tab (Metformin hcl) .Marland Kitchen... 1 by mouth qam    Glutose 15 40 % Gel (Dextrose (diabetic use)) .Marland Kitchen... Give 1 tube if cbg <60  Orders: Fingerstick (62130)  Problem # 4:  ATRIAL FIBRILLATION, PAROXYSMAL (ICD-427.31) on Coumadin, INR today okay, no change, recheck in two weeks His updated medication list for this problem includes:    Carvedilol 6.25 Mg Tabs (Carvedilol) .Marland Kitchen..Marland Kitchen Two times a day  Orders: Fingerstick (86578)  Problem # 5:  URINARY INCONTINENCE (ICD-788.30) he had a procedure for BPH remotely, incontinent since one of the last evaluations by urology was in 2007 and was dx with outlet obstruction and bladder instability  Problem # 6:  GERD (ICD-530.81) not well controlled despite omeprazole 40 mg  daily Will switch to pantoprazole 40 b.i.d. His updated medication list for this problem includes:    Pantoprazole Sodium 40 Mg Tbec (Pantoprazole sodium) ..... One twice a day on a  empty stomach  Problem # 7:  ALLERGIC RHINITIS (ICD-477.9) not well controlled with fluticonazole, change to astepro His updated medication list for this problem includes:    Astepro 0.15 % Soln (Azelastine hcl) .Marland Kitchen..Marland Kitchen Two sprays on each side of the nose twice a day    Cetirizine Hcl 10 Mg Tabs (Cetirizine hcl) .Marland Kitchen... 1 by mouth qhs  Problem # 8:  Also has a boil at nose x few days exam showed slightly  rednes at R nostril, mild enduration, tender no facialswelling rec Keflex , see Rx, call if no better or if worse   Complete Medication List: 1)  Lantus 100 Unit/ml Soln (Insulin glargine) .... 80 units at bedtime 2)  Novolog 100 Unit/ml Soln (Insulin aspart) .... Ss 110-150 - 5u 151-200 - 8u 201-250 12u 251-300 16u 301 -350 18u 351-400 22u >400 call md 3)  Metformin Hcl 750 Mg Xr24h-tab (Metformin hcl) .Marland Kitchen.. 1 by mouth qam 4)  Pepto-bismol 262 Mg/48ml Susp (Bismuth subsalicylate) .... As directed, prn 5)  Glutose 15 40 % Gel (Dextrose (diabetic use)) .... Give 1 tube if cbg <60 6)  Torsemide 100 Mg Tabs (Torsemide) .... 1/2 by mouth once daily 7)  Carvedilol 6.25 Mg Tabs (Carvedilol) .... Two times a day 8)  Spironolactone 25 Mg Tabs (Spironolactone) .... Two times a day 9)  Simvastatin 10 Mg Tabs (Simvastatin) .Marland Kitchen.. 1 by mouth qhs 10)  Sertraline Hcl 25 Mg Tabs (Sertraline hcl) .Marland Kitchen.. 1 by mouth once daily 11)  Donepezil Hcl 10 Mg Tabs (Donepezil hcl) .Marland Kitchen.. 1 by mouth at bedtime 12)  Vitamin D (ergocalciferol) 50000 Unit Caps (Ergocalciferol) .Marland Kitchen.. 1 by mouth every friday 13)  Ferrous Sulfate 325 (65 Fe) Mg Tabs (Ferrous sulfate) .Marland Kitchen.. 1 by mouth qam 14)  Pantoprazole Sodium 40 Mg Tbec (Pantoprazole sodium) .... One twice a day on a  empty stomach 15)  Multi-vitamin  .... Once daily 16)  Advair Diskus 100-50  Mcg/dose Aepb (Fluticasone-salmeterol) .... Two times a day 17)  Doxazosin Mesylate 4 Mg Tabs (Doxazosin mesylate) .Marland Kitchen.. 1 by mouth at bedtime 18)  Finasteride 5 Mg Tabs (Finasteride) .Marland Kitchen.. 1 by mouth qd 19)  Ventolin Hfa 108 (90 Base) Mcg/act Aers (Albuterol sulfate) .... 2 puffs every 4 hours as needed for sob or wheezing 20)  Simethicone 80 Mg Chew (Simethicone) .... Qid 21)  Astepro 0.15 % Soln (Azelastine hcl) .... Two sprays on each side of the nose twice a day 22)  Cetirizine Hcl 10 Mg Tabs (Cetirizine hcl) .Marland Kitchen.. 1 by mouth  qhs 23)  Patanol 0.1 % Soln (Olopatadine hcl) .Marland Kitchen.. 1 gtt as needed for itching 24)  Senna-plus 8.6-50 Mg Tabs (Sennosides-docusate sodium) .... 2 by mouth at bedtime 25)  Coumadin 7mg   .... Once daily 26)  Miralax Powd (Polyethylene glycol 3350) .Marland Kitchen.. 17gms by mouth once daily as needed constipation 27)  Coricidin Hbp Cough/cold 4-30 Mg Tabs (Chlorpheniramine-dm) .Marland Kitchen.. 1 by mouth every 12 hours as needed cough & congestion 28)  Acetaminophen 160 Mg/74ml Susp (Acetaminophen) .Marland Kitchen.. 10ml every 4 hours as needed pain 29)  Keflex 500 Mg Caps (Cephalexin) .Marland Kitchen.. 1 by mouth every 6 hours x 5 days  Other Orders: Capillary Blood Glucose/CBG (16109) Protime (60454UJ) Hgb (81191)  Patient Instructions: 1)  lantus  once a day only, 80 units at bedtime 2)  discontinue omeprazole, pantoprazole 40 mg twice a day 3)  discontinue fluticonazole, use Astepro 4)  continue with the same dose of Coumadin, ask them to check a INR in two weeks and fax it to me 5)  Please schedule a follow-up appointment in 1 month ( fasting) 6)  also keflex x 5 days  Prescriptions: KEFLEX 500 MG CAPS (CEPHALEXIN) 1 by mouth every 6 hours x 5 days  #20 x 0   Entered and Authorized by:   Nolon Rod. Buckley Bradly MD   Signed by:   Nolon Rod. Adisa Litt MD on 11/26/2009   Method used:   Print then Give to Patient   RxID:   [id number] ASTEPRO 0.15 % SOLN (AZELASTINE HCL) two sprays on each side of the nose twice a day  #3 x 3    Entered and Authorized by:   Elita Quick E. Kenzey Birkland MD   Signed by:   Nolon Rod. Dashawn Golda MD on 11/26/2009   Method used:   Print then Give to Patient   RxID:   [id number] PANTOPRAZOLE SODIUM 40 MG TBEC (PANTOPRAZOLE SODIUM) one twice a day on a  empty stomach  #180 x 3   Entered and Authorized by:   Nolon Rod. Luan Maberry MD   Signed by:   Nolon Rod. Adlai Nieblas MD on 11/26/2009   Method used:   Print then Give to Patient   RxID:   [id number]   Laboratory Results   Blood Tests     Glucose (random): 239 mg/dL   (Normal Range: 25-956)  INR: 2.1   (Normal Range: 0.88-1.12   Therap INR: 2.0-3.5)  CBC   HGB:  13.2 g/dL   (Normal Range: 38.7-56.4 in Males, 12.0-15.0 in Females) Comments: Coumadin was increased from 6mg  to 7mg  1 week ago Shary Decamp  Nov 26, 2009 2:50 PM      Appended Document: fu coming out assisted living/kdc time spent >> 45 minutes More than 50% counseling him in reference to each one of his chronic medical problems and acute boil . also a lot of time was spent reviewing his complex medical history and medication list

## 2010-08-23 NOTE — Progress Notes (Signed)
Summary: Appointment Concerns  ---- Converted from flag ---- ---- 04/01/2010 2:14 PM, Jose E. Paz MD wrote: due for INR ------------------------------  please schedule. Phone Note From Other Clinic   Caller: [name] Summary of Call: Please have Dr.Paz provide a standing order for patient to have PT checked the order should include check PT/INR as needed x 6 months with Diagnosis. Fax Order to [phone], Phone [phone] [name] Cell)   Shonna Chock CMA  May 04, 2010 1:38 PM     Additional Follow-up for Phone Call Additional follow up Details #2::    [facility] TO CALL AND SCHED INR APPOINTMENT.Marland KitchenMarland KitchenJerolyn Shin  May 04, 2010 12:08 PM  Additional Follow-up for Phone Call Additional follow up Details #3:: Details for Additional Follow-up Action Taken: [facility] called and indicated: Please have Dr.Paz provide a standing order for patient to have PT checked the order should include check PT/INR as needed x 6 months with Diagnosis. Fax Order to [phone], Phone [phone] [name] Cell)   Shonna Chock CMA  May 04, 2010 1:38 PM   New/Updated Medications: * PT/INR CHECK please check PT/INR as needed x 6 months. DX: V58.61/ 427.31 Prescriptions: PT/INR CHECK please check PT/INR as needed x 6 months. DX: V58.61/ 427.31  #12mo x 0   Entered by:   Shonna Chock CMA   Authorized by:   Nolon Rod. Paz MD   Signed by:   Shonna Chock CMA on 05/04/2010   Method used:   Print then Give to Patient   RxID:   [id number]

## 2010-08-23 NOTE — Progress Notes (Signed)
Summary: Todays concerns/referral  Phone Note Call from Patient Call back at [phone]   Caller: Daughter--Alma Summary of Call: Patient daughter notified of her fathers instructions and has the following concerns: 1.) She states that the patient is already using artificial tears and it has not worked, he needs something else 2.) She also notes that he was already taking Flonase and it did not work which was the reason for [employer], etc 3.) She states that Protonix is not helping her fathers reflux 4.) She states that Z pack will not help her father, it is like "Drinking water"  Please advise. Initial call taken by: Lucious Groves CMA,  June 21, 2010 3:57 PM  Follow-up for Phone Call        --by stopping the medication for runny nose his eyes will get better. He will have to accept some degree of running nose. The patient was in agreement, I recommend to follow up my plan. --arrange aaGI referral --I still think Z-Pak is a good idea --if no better, patient  could come back in 2 weeks with his daughter Follow-up by: Mava Suares E. Loxley Cibrian MD,  June 21, 2010 4:47 PM  Additional Follow-up for Phone Call Additional follow up Details #1::        Patient notified of the above and states that the patient will need a tues or thurs appt with GI. but she will call back if that referral if needed. Additional Follow-up by: Lucious Groves CMA,  June 21, 2010 4:57 PM

## 2010-08-23 NOTE — Miscellaneous (Signed)
Summary: PT INR Order/[facility]  PT INR Order/[facility]   Imported By: Lanelle Bal 03/24/2010 09:54:27  _____________________________________________________________________  External Attachment:    Type:   Image     Comment:   External Document

## 2010-08-23 NOTE — Assessment & Plan Note (Signed)
Summary: rto 3 months/cbs   Vital Signs:  Patient profile:   77 year old male Weight:      261.38 pounds Temp:     97.8 degrees F oral Pulse rate:   62 / minute Pulse rhythm:   regular BP sitting:   128 / 86  (left arm) Cuff size:   large  Vitals Entered By: Army Fossa CMA (June 21, 2010 1:51 PM) CC: follow up visit- not fasting  Comments Still having head congestion discuss acid reflux eyes huring feels dizzy Using Mucinex and robitussion   History of Present Illness: ROV the patient's daughter call her with the following concerns: --They patient needs a better eye drop for his excessively dry eye (regular drops are not helping) the patient indeed reports severely dry eyes. Takes  several medications that may account for his dry  eyes, many of these meds are intended to treat his chronic rhinitis but despite those meds  he still had some degree of runny  nose  --He has a chest cold that is lingering, ?Additional ABX needed patient admits to still coughing white sputum and having some chest congestion.  --He has terrible acid reflux, currently on Protonix. Possibly need alternate prescription. patient reports he takes Protonix twice a day 15 minutes before meals. Despite that, he reports "burning at the upper abdomen" and  actual pyrosis.        Current Medications (verified): 1)  Lantus 100 Unit/ml Soln (Insulin Glargine) .... 75 Units 2)  Novolog 100 Unit/ml Soln (Insulin Aspart) .... Ss 110-150 - 5u 151-200 - 8u 201-250 12u 251-300 16u 301 -350 18u 351-400 22u >400 Call Md 3)  Metformin Hcl 750 Mg Xr24h-Tab (Metformin Hcl) .Marland Kitchen.. 1 By Mouth Qam 4)  Pepto-Bismol 262 Mg/68ml Susp (Bismuth Subsalicylate) .... As Directed, Prn 5)  Glutose 15 40 % Gel (Dextrose (Diabetic Use)) .... Give 1 Tube If Cbg <60 6)  Torsemide 100 Mg Tabs (Torsemide) .... 1/2 By Mouth Once Daily 7)  Carvedilol 6.25 Mg Tabs (Carvedilol) .... Two Times A Day 8)  Spironolactone 25 Mg Tabs  (Spironolactone) .... Two Times A Day 9)  Zocor 20 Mg Tabs (Simvastatin) .Marland Kitchen.. 1 By Mouth At Bedtime. 10)  Sertraline Hcl 25 Mg Tabs (Sertraline Hcl) .Marland Kitchen.. 1 By Mouth Once Daily 11)  Donepezil Hcl 10 Mg Tabs (Donepezil Hcl) .Marland Kitchen.. 1 By Mouth At Bedtime 12)  Vitamin D (Ergocalciferol) 50000 Unit Caps (Ergocalciferol) .Marland Kitchen.. 1 By Mouth Every Friday 13)  Ferrous Sulfate 325 (65 Fe) Mg Tabs (Ferrous Sulfate) .Marland Kitchen.. 1 By Mouth Qam 14)  Pantoprazole Sodium 40 Mg Tbec (Pantoprazole Sodium) .... One Twice A Day On A  Empty Stomach 15)  Multi-Vitamin .... Once Daily 16)  Advair Diskus 100-50 Mcg/dose Aepb (Fluticasone-Salmeterol) .... Two Times A Day 17)  Doxazosin Mesylate 4 Mg Tabs (Doxazosin Mesylate) .Marland Kitchen.. 1 By Mouth At Bedtime 18)  Finasteride 5 Mg Tabs (Finasteride) .Marland Kitchen.. 1 By Mouth Qd 19)  Ventolin Hfa 108 (90 Base) Mcg/act Aers (Albuterol Sulfate) .... 2 Puffs Every 4 Hours As Needed For Sob or Wheezing 20)  Simethicone 80 Mg Chew (Simethicone) .... Qid 21)  Astepro 0.15 % Soln (Azelastine Hcl) .... Two Sprays On Each Side of The Nose Twice A Day 22)  Cetirizine Hcl 10 Mg Tabs (Cetirizine Hcl) .Marland Kitchen.. 1 By Mouth Qhs 23)  Patanol 0.1 % Soln (Olopatadine Hcl) .Marland Kitchen.. 1 Gtt As Needed For Itching 24)  Senna-Plus 8.6-50 Mg Tabs (Sennosides-Docusate Sodium) .... 2 By Mouth At Bedtime 25)  Coumadin 7mg  .... Once Daily 26)  Miralax  Powd (Polyethylene Glycol 3350) .Marland Kitchen.. 17gms By Mouth Once Daily As Needed Constipation 27)  Acetaminophen 160 Mg/60ml Susp (Acetaminophen) .Marland Kitchen.. 10ml Every 4 Hours As Needed Pain 28)  Pt/inr Check .... Please Check Pt/inr As Needed X 6 Months. Dx: V58.61/ 427.31 29)  Pt Orders .... Take Extra 3mg  Tab Today, Resume Normal Dosing Tomorrow-- Recheck in 2 Weeks. 30)  Systane  Allergies (verified): No Known Drug Allergies  Past History:  Past Medical History: Reviewed history from 01/11/2010 and no changes required. Diabetes mellitus, type II (1980) Hyperlipidemia (1980) Hypertension  (1980) Asthma Allergic rhinitis GERD Benign prostatic hypertrophy s/p ?thermo therapy, has bladdero utlet obst. and instability ( incontinent) Anemia-NOS chronic constipation Chronic increased alkaline phosphate ------------------------------------------------------------------- Cardiac history is extensive. see also past surgical history sick sinus syndrome  Paroxysmal atrial fibrillation status post direct cardioversion normal cardiac catheterization in 2003 with normal  left ventricular function (done after an abnormal Cardiolite)  history of severe  bradycardia on high dose AV-nodal blockers.   chronic bifascicular  block -------------------------------------------------------------------- Morbid obesity. sleep apnea with noncompliance with CPAP h/o  hypoxia in the past, likely multifactorial Gastroparesis diagnosed in 2006 through gastric scan. gynecomastia, normal mammograms 11/2008    Past Surgical History: Reviewed history from 11/26/2009 and no changes required. Cataract extraction Rotator cuff repair (R) negative colonoscopy 07/2003 normal cardiac catheterization in 2003 with normal  left ventricular function (done after an abnormal Cardiolite) negative Cardiolite 05/2004 normal ABIs 11-2005 low risk Cardiolite 05/2006  Social History: Reviewed history from 11/26/2009 and no changes required. has lived in a facility x aprox 4 years Moved to [place] 08-2008 widower children -- 2 daughters tobacco-- remotely ETOH-- not in a while Does not have a POA  Does not have a living will [place], daughter, [phone]  Review of Systems General:  Denies fever. CV:  Denies chest pain or discomfort and swelling of feet. Resp:  denies wheezing per se " just chest congestion". GI:  Denies diarrhea, nausea, and vomiting; no change in color of stool occ  dyphagia  .  Physical Exam  General:  alert and well-developed.  no apparent distress Head:   asymmetric, slightly tender in  both maxillary sinuses Ears:  external ocular movements intact Pupils are very small and hyperreactive to light but symmetric Nose:  slightly congested Lungs:  normal respiratory effort, no intercostal retractions, and no accessory muscle use.  slightly decreased breath sounds  . No increased work of breathing Heart:  iregular rate and rhythm, no murmur Extremities:  no edema   Impression & Recommendations:  Problem # 1:  ALLERGIC RHINITIS (ICD-477.9) the patient complained of severe dry eyes Many of the medications for allergic rhinitis cause  dry eyes, and even with all these medicines he continued to be symptomatic from rhinitis stand point  plan: Discontinue    Astepro ,    Cetirizine, patanol start Flonase patient told that RN may get worse but we need to avoid s/e  The following medications were removed from the medication list:    Astepro 0.15 % Soln (Azelastine hcl) .Marland Kitchen..Marland Kitchen Two sprays on each side of the nose twice a day    Cetirizine Hcl 10 Mg Tabs (Cetirizine hcl) .Marland Kitchen... 1 by mouth qhs His updated medication list for this problem includes:    Flonase 50 Mcg/act Susp (Fluticasone propionate) .Marland Kitchen... 2 sprays on each side of the nose daily  Problem # 2:  DRY EYE SYNDROME (ICD-375.15) see #1 Will  start artificial tears as needed if no better, would need a ophthalmology referral  Problem # 3:  HYPERTENSION (ICD-401.9) at goal  His updated medication list for this problem includes:    Torsemide 100 Mg Tabs (Torsemide) .Marland Kitchen... 1/2 by mouth once daily    Carvedilol 6.25 Mg Tabs (Carvedilol) .Marland Kitchen..Marland Kitchen Two times a day    Spironolactone 25 Mg Tabs (Spironolactone) .Marland Kitchen..Marland Kitchen Two times a day    Doxazosin Mesylate 4 Mg Tabs (Doxazosin mesylate) .Marland Kitchen... 1 by mouth at bedtime  BP today: 128/86 Prior BP: 142/90 (05/31/2010)  Labs Reviewed: K+: 4.4 (01/11/2010) Creat: : 1.6 (01/11/2010)   Chol: 195 (01/11/2010)   HDL: 37.30 (01/11/2010)   TG: 247.0 (01/11/2010)  Problem # 4:  DM  (ICD-250.00) labs  His updated medication list for this problem includes:    Lantus 100 Unit/ml Soln (Insulin glargine) .Marland KitchenMarland KitchenMarland KitchenMarland Kitchen 75 units    Novolog 100 Unit/ml Soln (Insulin aspart) ..... Ss 110-150 - 5u 151-200 - 8u 201-250 12u 251-300 16u 301 -350 18u 351-400 22u >400 call md    Metformin Hcl 750 Mg Xr24h-tab (Metformin hcl) .Marland Kitchen... 1 by mouth qam    Glutose 15 40 % Gel (Dextrose (diabetic use)) .Marland Kitchen... Give 1 tube if cbg <60  Labs Reviewed: Creat: 1.6 (01/11/2010)    Reviewed HgBA1c results: 6.8 (03/01/2010)  6.8 (11/26/2009)  Problem # 5:  GERD (ICD-530.81) still symptomatic despite taking PPIs We'll recommend to take pantoprazole 30 minutes before his meals He has a long history of GI problems, if he is not better, we'll need GI referral His updated medication list for this problem includes:    Pantoprazole Sodium 40 Mg Tbec (Pantoprazole sodium) ..... One twice a day on a  empty stomach  Problem # 6:  BRONCHITIS- ACUTE (ICD-466.0) continue with lingering symptoms  see instructions  His updated medication list for this problem includes:    Advair Diskus 100-50 Mcg/dose Aepb (Fluticasone-salmeterol) .Marland Kitchen..Marland Kitchen Two times a day    Ventolin Hfa 108 (90 Base) Mcg/act Aers (Albuterol sulfate) .Marland Kitchen... 2 puffs every 4 hours as needed for sob or wheezing    Zithromax Z-pak 250 Mg Tabs (Azithromycin) .Marland Kitchen... As directed for 5 days1  Problem # 7:  HYPERLIPIDEMIA (ICD-272.4) labs His updated medication list for this problem includes:    Zocor 20 Mg Tabs (Simvastatin) .Marland Kitchen... 1 by mouth at bedtime.  Labs Reviewed: SGOT: 25 (03/01/2010)   SGPT: 23 (03/01/2010)   HDL:37.30 (01/11/2010)  Chol:195 (01/11/2010)  Trig:247.0 (01/11/2010)  Problem # 8:   addendum we carefully discussed this plan with the patient We'll also call the daughter and discussed this plan in detail. We'll send her a copy of our instructions  Problem # 9:  time spent > 35 min  Complete Medication List: 1)  Lantus 100 Unit/ml Soln  (Insulin glargine) .... 75 units 2)  Novolog 100 Unit/ml Soln (Insulin aspart) .... Ss 110-150 - 5u 151-200 - 8u 201-250 12u 251-300 16u 301 -350 18u 351-400 22u >400 call md 3)  Metformin Hcl 750 Mg Xr24h-tab (Metformin hcl) .Marland Kitchen.. 1 by mouth qam 4)  Pepto-bismol 262 Mg/65ml Susp (Bismuth subsalicylate) .... As directed, prn 5)  Glutose 15 40 % Gel (Dextrose (diabetic use)) .... Give 1 tube if cbg <60 6)  Torsemide 100 Mg Tabs (Torsemide) .... 1/2 by mouth once daily 7)  Carvedilol 6.25 Mg Tabs (Carvedilol) .... Two times a day 8)  Spironolactone 25 Mg Tabs (Spironolactone) .... Two times a day 9)  Zocor 20 Mg Tabs (Simvastatin) .Marland KitchenMarland KitchenMarland Kitchen  1 by mouth at bedtime. 10)  Sertraline Hcl 25 Mg Tabs (Sertraline hcl) .Marland Kitchen.. 1 by mouth once daily 11)  Donepezil Hcl 10 Mg Tabs (Donepezil hcl) .Marland Kitchen.. 1 by mouth at bedtime 12)  Vitamin D (ergocalciferol) 50000 Unit Caps (Ergocalciferol) .Marland Kitchen.. 1 by mouth every friday 13)  Ferrous Sulfate 325 (65 Fe) Mg Tabs (Ferrous sulfate) .Marland Kitchen.. 1 by mouth qam 14)  Pantoprazole Sodium 40 Mg Tbec (Pantoprazole sodium) .... One twice a day on a  empty stomach 15)  Multi-vitamin  .... Once daily 16)  Advair Diskus 100-50 Mcg/dose Aepb (Fluticasone-salmeterol) .... Two times a day 17)  Doxazosin Mesylate 4 Mg Tabs (Doxazosin mesylate) .Marland Kitchen.. 1 by mouth at bedtime 18)  Finasteride 5 Mg Tabs (Finasteride) .Marland Kitchen.. 1 by mouth qd 19)  Ventolin Hfa 108 (90 Base) Mcg/act Aers (Albuterol sulfate) .... 2 puffs every 4 hours as needed for sob or wheezing 20)  Simethicone 80 Mg Chew (Simethicone) .... Qid 21)  Senna-plus 8.6-50 Mg Tabs (Sennosides-docusate sodium) .... 2 by mouth at bedtime 22)  Coumadin 7mg   .... Once daily 23)  Miralax Powd (Polyethylene glycol 3350) .Marland Kitchen.. 17gms by mouth once daily as needed constipation 24)  Acetaminophen 160 Mg/69ml Susp (Acetaminophen) .Marland Kitchen.. 10ml every 4 hours as needed pain 25)  Pt/inr Check  .... Please check pt/inr as needed x 6 months. dx: v58.61/ 427.31 26)  Pt  Orders  .... Take extra 3mg  tab today, resume normal dosing tomorrow-- recheck in 2 weeks. 27)  Systane  28)  Flonase 50 Mcg/act Susp (Fluticasone propionate) .... 2 sprays on each side of the nose daily 29)  Artificial Tears Soln (Artificial tear solution) .... Apply  to both eyes as frequent as every 2 hours p.r.n. dry eye 30)  Zithromax Z-pak 250 Mg Tabs (Azithromycin) .... As directed for 5 days1  Patient Instructions: 1)  STOP  Astepro , Cetirizine, patanol 2)  start Flonase nasal spray 3)  start artificial tears 4)  If your eyes are not better in 10 days let me know 5)  pantoprazole needs to be taking 30 MINUTES BEFORE YOUR MEALS 6)  If the acid reflux is not better ,  let us know. We'll send you  to the GI doctor again 7)  for bronchitis,  take a Z-Pak for 5 days and Mucinex DM one tablet twice a day for one week. Call if not better. 8)  Come back fasting for blood work: 9)  FLP, AST, ALT---- dx  high cholesterol 10)  Hemoglobin A1c ----- dx diabetes 11)  CBC----- dx  anemia 12)  Please schedule a follow-up appointment in 3 months .  Prescriptions: ZITHROMAX Z-PAK 250 MG TABS (AZITHROMYCIN) as directed for 5 days1  #1 x 0   Entered and Authorized by:   Nolon Rod. Paz MD   Signed by:   Nolon Rod. Paz MD on 06/21/2010   Method used:   Print then Give to Patient   RxID:   [id number] ARTIFICIAL TEARS  SOLN (ARTIFICIAL TEAR SOLUTION) apply  to both eyes as frequent as every 2 hours p.r.n. dry eye  #1 x 6   Entered and Authorized by:   Nolon Rod. Paz MD   Signed by:   Nolon Rod. Paz MD on 06/21/2010   Method used:   Print then Give to Patient   RxID:   [id number] FLONASE 50 MCG/ACT SUSP (FLUTICASONE PROPIONATE) 2 sprays on each side of the nose daily  #1 x 6   Entered and Authorized by:  Jose E. Paz MD   Signed by:   Nolon Rod. Paz MD on 06/21/2010   Method used:   Print then Give to Patient   RxID:   [id number]    Orders Added: 1)  Est. Patient Level IV [56213]

## 2010-08-23 NOTE — Miscellaneous (Signed)
Summary: OT Care Plan/[facility]  OT Care Plan/[facility]   Imported By: Lanelle Bal 12/09/2009 12:46:18  _____________________________________________________________________  External Attachment:    Type:   Image     Comment:   External Document

## 2010-08-23 NOTE — Progress Notes (Signed)
Summary: Refil  Phone Note Refill Request   Refills Requested: Medication #1:  SERTRALINE HCL 25 MG TABS 1 by mouth once daily From [facility]- would like it faxed to them [phone]. How many refills can pt have on this med? Army Fossa CMA  March 23, 2010 3:08 PM    Follow-up for Phone Call        #90, 3 refills Follow-up by: [facility] E. Paz MD,  March 23, 2010 3:59 PM    Prescriptions: SERTRALINE HCL 25 MG TABS (SERTRALINE HCL) 1 by mouth once daily  #90 x 3   Entered by:   Army Fossa CMA   Authorized by:   Nolon Rod. Paz MD   Signed by:   Army Fossa CMA on 03/23/2010   Method used:   Printed then faxed to ...         RxID:   [id number]

## 2010-08-23 NOTE — Miscellaneous (Signed)
Summary: Robitussin Order/[facility]  Robitussin Order/[facility]   Imported By: Lanelle Bal 06/09/2010 11:37:06  _____________________________________________________________________  External Attachment:    Type:   Image     Comment:   External Document

## 2010-08-23 NOTE — Miscellaneous (Signed)
Summary: BS Order/[facility]  BS Order/[facility]   Imported By: Lanelle Bal 04/15/2010 14:12:28  _____________________________________________________________________  External Attachment:    Type:   Image     Comment:   External Document

## 2010-08-23 NOTE — Medication Information (Signed)
Summary: Therapeutic Interchange Request/[facility]  Therapeutic Interchange Request/[facility]   Imported By: Lanelle Bal 06/22/2010 14:16:47  _____________________________________________________________________  External Attachment:    Type:   Image     Comment:   External Document

## 2010-08-23 NOTE — Miscellaneous (Signed)
Summary: INR 2.05, same Coumadin, recheck in 4 weeks   Medications Added * PT/INR Recheck in 4 weeks.       Clinical Lists Changes  Medications: Added new medication of * PT/INR Recheck in 4 weeks. - Signed Rx of PT/INR Recheck in 4 weeks.;  #1 x 0;  Signed;  Entered by: Army Fossa CMA;  Authorized by: Nolon Rod [place] Severs MD;  Method used: Print then Give to Patient Observations: Added new observation of INR: 2.05  (02/01/2010 13:32) Added new observation of INR: 2.05  (01/26/2010 13:32)  INR 2.05, same Coumadin, recheck in 4 weeks Tyler [name] E. Handy Mcloud MD  February 01, 2010 3:33 PM    [name] is aware, willl fax over order for them. Army Fossa CMA  February 01, 2010 3:36 PM   Prescriptions: PT/INR Recheck in 4 weeks.  #1 x 0   Entered by:   Army Fossa CMA   Authorized by:   Nolon Rod. Treyten Monestime MD   Signed by:   Army Fossa CMA on 02/01/2010   Method used:   Print then Give to Patient   RxID:   [id number]

## 2010-08-23 NOTE — Miscellaneous (Signed)
Summary: Care Plan & D/C Standing Orders/[facility] & D/C Standing Orders/[facility]    Imported By: Lanelle Bal 06/13/2010 12:58:13  _____________________________________________________________________  External Attachment:    Type:   Image     Comment:   External Document

## 2010-08-23 NOTE — Progress Notes (Signed)
Summary: PT/INR Concerns  Phone Note From Other Clinic   Caller: Anthoney Harada Summary of Call: Pam was calling to indicated they need a fax indicating if patient to change his coumadin or remain on same dose (no change). This should be faxed to [phone], if any questions call: [phone]   Shonna Chock CMA  May 20, 2010 11:18 AM   Follow-up for Phone Call        see  labs Follow-up by: [facility] E. Paz MD,  May 20, 2010 12:59 PM  Additional Follow-up for Phone Call Additional follow up Details #1::        faxed to pam. Additional Follow-up by: Army Fossa CMA,  May 20, 2010 1:16 PM

## 2010-08-23 NOTE — Progress Notes (Signed)
Summary: PT/INR--2.49  Phone Note From Other Clinic   Summary of Call: Labs from [facility]-  PT- 29.3 INR- 2.49  Follow-up for Phone Call        pt states that he takes 7mg  daily. Army Fossa CMA  February 28, 2010 2:40 PM   Additional Follow-up for Phone Call Additional follow up Details #1::        advise patient, no change. Recheck in 4 weeks Additional Follow-up by: Jose E. Paz MD,  February 28, 2010 2:44 PM    Additional Follow-up for Phone Call Additional follow up Details #2::    I spoke with pt he is aware. Army Fossa CMA  February 28, 2010 2:45 PM

## 2010-08-23 NOTE — Progress Notes (Signed)
Summary: INR  Phone Note Other Incoming Call back at [phone], fax [phone]   Caller: Bea Laura of [place], JUDY Details for Reason:  - INR results  - Blood sugar readings Summary of Call: FASTING: 57, 191, 122, 113, 91, 110, 90, 124, 78, 210, 120, 292, 77, 90, 149, 128, 105, 170 LUNCH: 234, 134, 222, 150, 155, 185, 176, 254, 245, 158, 253, 155, 243, 266, 202, 281, 201, 127 BEFORE DINNER: 207, 295, 297, 233, 287, 442, 147, 355, 216, 213, 119, 187, 151, 229, 212, 311, 156, 232 BEFORE BEDTIME: 245, 251, 179, 197, 268, 285, 429, 176, 277, 255, 217, 91, 180, 139, 229, 174, 349, 134, 181 Patient on ss of insulin .........[name]  Dec 10, 2009 1:08 PM    Follow-up for Phone Call        decrease Lantus from 80-75 units in the morning  Discussed with [name], order faxed Tyler [name]  Dec 10, 2009 2:59 PM      New/Updated Medications: LANTUS 100 UNIT/ML SOLN (INSULIN GLARGINE) 75 units  Laboratory Results   Blood Tests     PT: 19.5 s   (Normal Range: 10.6-13.4)  INR: 1.54   (Normal Range: 0.88-1.12   Therap INR: 2.0-3.5) Comments: CURRENT DOSE: Coumadin 7mg  daily Tyler [name]  Dec 10, 2009 12:44 PM no change, recheck in 10 days Jose E. Paz MD  Dec 10, 2009 1:54 PM

## 2010-08-23 NOTE — Progress Notes (Signed)
  Phone Note Other Incoming   Summary of Call: [facility] calling for help dosing pt's insulin. His BSs have been running in the 400+ range all day except for one sub 100 number once. These nos are all generated from the same machine. The NH had called earlier in the day with a high 400 BS and was told by the nurse following protocol that the pt needed to be seen at the ER. The pt refused and they are now calling back requesting further help in dosing. They have given him his bedtime dose of Novolog at 730PM when BS was 479 and it is now 421 at 9PM. Looking him up on the computer by name as his date of birth was given incorrectly per info in EMR, pt's history says nothing about diabetes, insulin or coumadin, all of which  he is taking per the info via Call a Nurse. Pt (by reading note in chart) has been unhappy with his doctor's care and saw Dr Drue Novel yesterday apparently for the first time. He  seems to have left out significant bits of his history. Due to the inability to act with confidence that I have all of the information needed, I again told the nurse that the pt needed to be seen at the ER to actually be evaluated rather than my merely reacting to a high BS  that probably has physiologic reasons for being high that I cannot evaluate over the phone and through two parties. After researching the chart further, the office note from yesterday has medication information that haas not been entered yet into the EMR. I therefore called the nurse back and gave a best estimate of giving the pt 25units of Novolog now (He had been given 80 of Lantus per chart, not 80 of Novolog as I was told) at 730PM and continue otherwise orders and medications as previously prescribed. The daughter told the nurse that there have been issues of accuracy of fingerstick nos. and that her father has felt fine throughout this entire episode. Thus, told to give 25 units now and recheck in 2 hrs. Initial call taken by: Shaune Leeks MD,  Nov 27, 2009 9:28 PM

## 2010-08-23 NOTE — Miscellaneous (Signed)
Summary: Physician Orders/[facility]  Physician Orders/[facility]   Imported By: Lanelle Bal 02/09/2010 10:28:34  _____________________________________________________________________  External Attachment:    Type:   Image     Comment:   External Document

## 2010-08-23 NOTE — Miscellaneous (Signed)
Summary: Coumadin Order/[facility]  Coumadin Order/[facility]   Imported By: Lanelle Bal 04/20/2010 08:33:40  _____________________________________________________________________  External Attachment:    Type:   Image     Comment:   External Document

## 2010-08-23 NOTE — Progress Notes (Signed)
Summary: fyi 2nd call a nurse  Phone Note From Other Clinic   Summary of Call: Triage Call Report Triage Record Num: [id number] Operator: Dayton Martes Patient Name: Hiroki Wint Call Date & Time: 11/27/2009 8:52:19PM Patient Phone: [phone] PCP: Nolon Rod. Paz Patient Gender: Male PCP Fax : Patient DOB: [birth year] Practice Name: [facility] Reason for Call: Larina Earthly with [facility] [place] sts that she called earlier and was adv to send pt to ER but he refused. Pt has a sliding scale but it sts for BS > 400 call MD. BS 433 @ 2000. Pt takes Novalog insulin for sliding scale and Lantus 80units @ hs. Pt had lantus @ 1930. BS was 447 @ 1630 and no insulin was given and BS dropped to 59; EMS was called. 1645 BS 301. 1715 BS 421. Pt started taking Keflex 11/26/09 for "infection in his nose." Denies 911 sx's. 2103-Notified Dr. Hetty Ely of the above. Dr. Hetty Ely pulled pt's chart and there are only 3 meds listed and insulin is not one of them and it is not on the chart that pt is diabetic. Dr. Hetty Ely sts that pt must go to ER for treatment as there is not enough information for him to treat him over the phone. RN Valora Piccolo of the above. Elease Hashimoto asked that RN speak with pt and advise him. RN spoke with pt and adv ER with rationale. RN called pt's daughter/Alma per pt's request. Daughter sts, "My father is fine. If the Med Tech would just leave him alone he would be fine and his bs would go down in a couple of hours. I am going to talk to them " Daughter sts that she is going to call her father and she will decide if he is going to the ER. 2133-Dr. Hetty Ely called back and sts that her reviewed pt's chart further and the Hgb A1C is 6.8 meaning that BS is avg 150. T.O. DC ER order. Novalog insulin 25units SQ x 1 now. Recheck BS in 2 hrs. RN called Elease Hashimoto of the above T.O. and faxed T.O. to [phone]. RN called daughter back and advised also. Protocol(s)  Used: Diabetes: Out of Control Recommended Outcome per Protocol: Call Provider Immediately Reason for Outcome: Blood sugar 250 mg/dl o  Follow-up for Phone Call        Spoke with Shaune Pascal med tech, pt refused to go to ED on saturday. --pt is fine now bs =90 before breakfast, 254 before lunch, 147 before dinner, and 176 bedtime last night. BS=164 this am, given 8 units of insulin. Med tech is faxing pt BS meds .Kandice Hams  Nov 29, 2009 9:27 AM

## 2010-08-23 NOTE — Assessment & Plan Note (Signed)
Summary: PAPER?//PH   Vital Signs:  Patient profile:   77 year old male Weight:      261.38 pounds O2 Sat:      96 % on Room air Temp:     98.0 degrees F oral Pulse rate:   88 / minute Pulse rhythm:   regular BP sitting:   142 / 90  (left arm) Cuff size:   large  Vitals Entered By: Army Fossa CMA (May 31, 2010 1:55 PM)  O2 Flow:  Room air CC: Pt here c/o chest and head congestion Comments x 2 weeks not taking any OTC meds   History of Present Illness: Acute visit cough x 2 weeks, clear sputum   Current Medications (verified): 1)  Lantus 100 Unit/ml Soln (Insulin Glargine) .... 75 Units 2)  Novolog 100 Unit/ml Soln (Insulin Aspart) .... Ss 110-150 - 5u 151-200 - 8u 201-250 12u 251-300 16u 301 -350 18u 351-400 22u >400 Call Md 3)  Metformin Hcl 750 Mg Xr24h-Tab (Metformin Hcl) .Marland Kitchen.. 1 By Mouth Qam 4)  Pepto-Bismol 262 Mg/22ml Susp (Bismuth Subsalicylate) .... As Directed, Prn 5)  Glutose 15 40 % Gel (Dextrose (Diabetic Use)) .... Give 1 Tube If Cbg <60 6)  Torsemide 100 Mg Tabs (Torsemide) .... 1/2 By Mouth Once Daily 7)  Carvedilol 6.25 Mg Tabs (Carvedilol) .... Two Times A Day 8)  Spironolactone 25 Mg Tabs (Spironolactone) .... Two Times A Day 9)  Zocor 20 Mg Tabs (Simvastatin) .Marland Kitchen.. 1 By Mouth At Bedtime. 10)  Sertraline Hcl 25 Mg Tabs (Sertraline Hcl) .Marland Kitchen.. 1 By Mouth Once Daily 11)  Donepezil Hcl 10 Mg Tabs (Donepezil Hcl) .Marland Kitchen.. 1 By Mouth At Bedtime 12)  Vitamin D (Ergocalciferol) 50000 Unit Caps (Ergocalciferol) .Marland Kitchen.. 1 By Mouth Every Friday 13)  Ferrous Sulfate 325 (65 Fe) Mg Tabs (Ferrous Sulfate) .Marland Kitchen.. 1 By Mouth Qam 14)  Pantoprazole Sodium 40 Mg Tbec (Pantoprazole Sodium) .... One Twice A Day On A  Empty Stomach 15)  Multi-Vitamin .... Once Daily 16)  Advair Diskus 100-50 Mcg/dose Aepb (Fluticasone-Salmeterol) .... Two Times A Day 17)  Doxazosin Mesylate 4 Mg Tabs (Doxazosin Mesylate) .Marland Kitchen.. 1 By Mouth At Bedtime 18)  Finasteride 5 Mg Tabs (Finasteride) .Marland Kitchen.. 1  By Mouth Qd 19)  Ventolin Hfa 108 (90 Base) Mcg/act Aers (Albuterol Sulfate) .... 2 Puffs Every 4 Hours As Needed For Sob or Wheezing 20)  Simethicone 80 Mg Chew (Simethicone) .... Qid 21)  Astepro 0.15 % Soln (Azelastine Hcl) .... Two Sprays On Each Side of The Nose Twice A Day 22)  Cetirizine Hcl 10 Mg Tabs (Cetirizine Hcl) .Marland Kitchen.. 1 By Mouth Qhs 23)  Patanol 0.1 % Soln (Olopatadine Hcl) .Marland Kitchen.. 1 Gtt As Needed For Itching 24)  Senna-Plus 8.6-50 Mg Tabs (Sennosides-Docusate Sodium) .... 2 By Mouth At Bedtime 25)  Coumadin 7mg  .... Once Daily 26)  Miralax  Powd (Polyethylene Glycol 3350) .Marland Kitchen.. 17gms By Mouth Once Daily As Needed Constipation 27)  Acetaminophen 160 Mg/42ml Susp (Acetaminophen) .Marland Kitchen.. 10ml Every 4 Hours As Needed Pain 28)  Pt/inr Check .... Please Check Pt/inr As Needed X 6 Months. Dx: V58.61/ 427.31 29)  Pt Orders .... Take Extra 3mg  Tab Today, Resume Normal Dosing Tomorrow-- Recheck in 2 Weeks. 30)  Systane  Allergies (verified): No Known Drug Allergies  Past History:  Past Medical History: Reviewed history from 01/11/2010 and no changes required. Diabetes mellitus, type II (1980) Hyperlipidemia (1980) Hypertension (1980) Asthma Allergic rhinitis GERD Benign prostatic hypertrophy s/p ?thermo therapy, has bladdero utlet  obst. and instability ( incontinent) Anemia-NOS chronic constipation Chronic increased alkaline phosphate ------------------------------------------------------------------- Cardiac history is extensive. see also past surgical history sick sinus syndrome  Paroxysmal atrial fibrillation status post direct cardioversion normal cardiac catheterization in 2003 with normal  left ventricular function (done after an abnormal Cardiolite)  history of severe  bradycardia on high dose AV-nodal blockers.   chronic bifascicular  block -------------------------------------------------------------------- Morbid obesity. sleep apnea with noncompliance with CPAP h/o   hypoxia in the past, likely multifactorial Gastroparesis diagnosed in 2006 through gastric scan. gynecomastia, normal mammograms 11/2008    Past Surgical History: Reviewed history from 11/26/2009 and no changes required. Cataract extraction Rotator cuff repair (R) negative colonoscopy 07/2003 normal cardiac catheterization in 2003 with normal  left ventricular function (done after an abnormal Cardiolite) negative Cardiolite 05/2004 normal ABIs 11-2005 low risk Cardiolite 05/2006  Social History: Reviewed history from 11/26/2009 and no changes required. has lived in a facility x aprox 4 years Moved to [place] 08-2008 widower children -- 2 daughters tobacco-- remotely ETOH-- not in a while Does not have a POA  Does not have a living will [place], daughter, [phone]  Review of Systems General:  Denies chills and fever. ENT:  (+) sinus pressure, nasal d/c (clear). Resp:  Denies chest pain with inspiration and shortness of breath; some wheezing . GI:  Denies diarrhea, nausea, and vomiting.  Physical Exam  General:  alert and well-developed.   Head:  tender B maxilary sinus area face symetric  Ears:  TM slightly  bulge, no red or d/c  Mouth:  no red or d/c  Lungs:  normal respiratory effort, no intercostal retractions, and no accessory muscle use.  slightly decreased breath sounds and end exp. wheezing. no increased WOB  Heart:  iregular rate and rhythm, no murmur   Impression & Recommendations:  Problem # 1:  BRONCHITIS- ACUTE (ICD-466.0) see instructions   His updated medication list for this problem includes:    Advair Diskus 100-50 Mcg/dose Aepb (Fluticasone-salmeterol) .Marland Kitchen..Marland Kitchen Two times a day    Ventolin Hfa 108 (90 Base) Mcg/act Aers (Albuterol sulfate) .Marland Kitchen... 2 puffs every 4 hours as needed for sob or wheezing    Cefuroxime Axetil 500 Mg Tabs (Cefuroxime axetil) .Marland Kitchen... 1 by mouth two times a day x 1 week  Orders: T-2 View CXR (71020TC)  Complete Medication List: 1)   Lantus 100 Unit/ml Soln (Insulin glargine) .... 75 units 2)  Novolog 100 Unit/ml Soln (Insulin aspart) .... Ss 110-150 - 5u 151-200 - 8u 201-250 12u 251-300 16u 301 -350 18u 351-400 22u >400 call md 3)  Metformin Hcl 750 Mg Xr24h-tab (Metformin hcl) .Marland Kitchen.. 1 by mouth qam 4)  Pepto-bismol 262 Mg/58ml Susp (Bismuth subsalicylate) .... As directed, prn 5)  Glutose 15 40 % Gel (Dextrose (diabetic use)) .... Give 1 tube if cbg <60 6)  Torsemide 100 Mg Tabs (Torsemide) .... 1/2 by mouth once daily 7)  Carvedilol 6.25 Mg Tabs (Carvedilol) .... Two times a day 8)  Spironolactone 25 Mg Tabs (Spironolactone) .... Two times a day 9)  Zocor 20 Mg Tabs (Simvastatin) .Marland Kitchen.. 1 by mouth at bedtime. 10)  Sertraline Hcl 25 Mg Tabs (Sertraline hcl) .Marland Kitchen.. 1 by mouth once daily 11)  Donepezil Hcl 10 Mg Tabs (Donepezil hcl) .Marland Kitchen.. 1 by mouth at bedtime 12)  Vitamin D (ergocalciferol) 50000 Unit Caps (Ergocalciferol) .Marland Kitchen.. 1 by mouth every friday 13)  Ferrous Sulfate 325 (65 Fe) Mg Tabs (Ferrous sulfate) .Marland Kitchen.. 1 by mouth qam 14)  Pantoprazole Sodium 40  Mg Tbec (Pantoprazole sodium) .... One twice a day on a  empty stomach 15)  Multi-vitamin  .... Once daily 16)  Advair Diskus 100-50 Mcg/dose Aepb (Fluticasone-salmeterol) .... Two times a day 17)  Doxazosin Mesylate 4 Mg Tabs (Doxazosin mesylate) .Marland Kitchen.. 1 by mouth at bedtime 18)  Finasteride 5 Mg Tabs (Finasteride) .Marland Kitchen.. 1 by mouth qd 19)  Ventolin Hfa 108 (90 Base) Mcg/act Aers (Albuterol sulfate) .... 2 puffs every 4 hours as needed for sob or wheezing 20)  Simethicone 80 Mg Chew (Simethicone) .... Qid 21)  Astepro 0.15 % Soln (Azelastine hcl) .... Two sprays on each side of the nose twice a day 22)  Cetirizine Hcl 10 Mg Tabs (Cetirizine hcl) .Marland Kitchen.. 1 by mouth qhs 23)  Patanol 0.1 % Soln (Olopatadine hcl) .Marland Kitchen.. 1 gtt as needed for itching 24)  Senna-plus 8.6-50 Mg Tabs (Sennosides-docusate sodium) .... 2 by mouth at bedtime 25)  Coumadin 7mg   .... Once daily 26)  Miralax Powd  (Polyethylene glycol 3350) .Marland Kitchen.. 17gms by mouth once daily as needed constipation 27)  Acetaminophen 160 Mg/13ml Susp (Acetaminophen) .Marland Kitchen.. 10ml every 4 hours as needed pain 28)  Pt/inr Check  .... Please check pt/inr as needed x 6 months. dx: v58.61/ 427.31 29)  Pt Orders  .... Take extra 3mg  tab today, resume normal dosing tomorrow-- recheck in 2 weeks. 30)  Systane  31)  Cefuroxime Axetil 500 Mg Tabs (Cefuroxime axetil) .Marland Kitchen.. 1 by mouth two times a day x 1 week  Patient Instructions: 1)  GET A CXR DONE PLEASE 2)  drink plenty of fluids 3)  mucinex DM 1 tablet twice a day x 1 week, then as needed for cough 4)  antibiotics --- cefuroxime x 1 week (I'm prescribing enough for 10 days in case you need more antibiotics) 5)  use albuterol as needed every 6 hours if wheezing or chest congestion 6)  call if no better in few days 7)  ER if symptoms severe, high fever, shortness of breath, chest pain Prescriptions: CEFUROXIME AXETIL 500 MG TABS (CEFUROXIME AXETIL) 1 by mouth two times a day x 1 week  #20 x 0   Entered and Authorized by:   Nolon Rod. Paz MD   Signed by:   Nolon Rod. Paz MD on 05/31/2010   Method used:   Print then Give to Patient   RxID:   [id number]    Orders Added: 1)  T-2 View CXR [71020TC] 2)  Est. Patient Level III [14782]  Appended Document: PAPER?//PH advise patient Chest x-ray showed no pneumonia. Plan is the same  Appended Document: PAPER?//PH  left message for pt to call back. Army Fossa CMA  June 02, 2010 8:28 AM  Patient is aware.Harold Barban  June 03, 2010 9:15 AM

## 2010-08-23 NOTE — Progress Notes (Signed)
Summary: elevated BS  Phone Note Other Incoming Call back at [phone]   Caller: [facility] Summary of Call: called patient blood sugar today- 424 he was given 22 units of novolog and wanted to know if any further instructions today  Initial call taken by: [name],  March 21, 2010 4:38 PM  Follow-up for Phone Call        I spoke with [facility] and informed her they did not need to cahnge anything tonight. They need to call in 24 hrs with [facility]'s. Army Fossa CMA  March 21, 2010 5:14 PM

## 2010-08-23 NOTE — Medication Information (Signed)
Summary: Therapeutic Interchange Request/[facility]  Therapeutic Interchange Request/[facility]   Imported By: Lanelle Bal 12/15/2009 08:11:49  _____________________________________________________________________  External Attachment:    Type:   Image     Comment:   External Document

## 2010-08-23 NOTE — Progress Notes (Signed)
Summary: Labs requested  Phone Note From Other Clinic   Caller: [name], RN Summary of Call: [name] works at patient facility and lm on triage requesting our most recent labs on the patient. She needs cmp, lipid, and A1c faxed to [phone]. She did not leave # for return call.  **Faxed labs per request. Initial call taken by: Lucious Groves CMA,  April 26, 2010 2:03 PM

## 2010-08-23 NOTE — Assessment & Plan Note (Signed)
Summary: fasting 1 month roa//lch   Vital Signs:  Patient profile:   77 year old male Weight:      261 pounds Temp:     98.0 degrees F oral Pulse rate:   86 / minute BP sitting:   120 / 76  (left arm)  Vitals Entered By: Jeremy Johann CMA (January 11, 2010 8:22 AM) CC: f/u Comments --fasting    History of Present Illness: routine visit, here with his daughter  he brought a number of records from his assisted living facility   Diabetes-- [facility] records reviewed, results are variable before breakfast range 67-150 Before lunch range 150-200 Before dinner 150-200 At bedtime 160s to mid 200s   Hypertension-- reports good ambulatory BPs   Allergic rhinitis--continue with moderate to severe nasal congestion  GERD--complaining of a discomfort from the throat to the epigastric area, burning at times, he does have heartburn which is occ. severe        Allergies (verified): No Known Drug Allergies  Past History:  Past Medical History: Diabetes mellitus, type II (1980) Hyperlipidemia (1980) Hypertension (1980) Asthma Allergic rhinitis GERD Benign prostatic hypertrophy s/p ?thermo therapy, has bladdero utlet obst. and instability ( incontinent) Anemia-NOS chronic constipation Chronic increased alkaline phosphate ------------------------------------------------------------------- Cardiac history is extensive. see also past surgical history sick sinus syndrome  Paroxysmal atrial fibrillation status post direct cardioversion normal cardiac catheterization in 2003 with normal  left ventricular function (done after an abnormal Cardiolite)  history of severe  bradycardia on high dose AV-nodal blockers.   chronic bifascicular  block -------------------------------------------------------------------- Morbid obesity. sleep apnea with noncompliance with CPAP h/o  hypoxia in the past, likely multifactorial Gastroparesis diagnosed in 2006 through gastric scan. gynecomastia,  normal mammograms 11/2008    Past Surgical History: Reviewed history from 11/26/2009 and no changes required. Cataract extraction Rotator cuff repair (R) negative colonoscopy 07/2003 normal cardiac catheterization in 2003 with normal  left ventricular function (done after an abnormal Cardiolite) negative Cardiolite 05/2004 normal ABIs 11-2005 low risk Cardiolite 05/2006  Social History: Reviewed history from 11/26/2009 and no changes required. has lived in a facility x aprox 4 years Moved to [place] 08-2008 widower children -- 2 daughters tobacco-- remotely ETOH-- not in a while Does not have a POA  Does not have a living will [place], daughter, [phone]  Review of Systems CV:  Denies swelling of feet; rarely has left upper chest discomfort for weeks. GI:  no nausea, vomiting No constipation No blood in the stools. Psych:  symptoms well controlled on current medications.  Physical Exam  General:  alert and well-developed.   Ears:  R ear normal and L ear normal.   Nose:  slightly congested Lungs:  normal respiratory effort, no intercostal retractions, and no accessory muscle use.  slightly decreased breath sounds Heart:  iregular rate and rhythm, no murmur Abdomen:  soft, non-tender, and no distention.   Extremities:  no edema Psych:  Oriented X3, not anxious appearing, and not depressed appearing.     Impression & Recommendations:  Problem # 1:  HYPERTENSION (ICD-401.9) seems to be well controlled, check BMP, creatinine was slightly elevated recently His updated medication list for this problem includes:    Torsemide 100 Mg Tabs (Torsemide) .Marland Kitchen... 1/2 by mouth once daily    Carvedilol 6.25 Mg Tabs (Carvedilol) .Marland Kitchen..Marland Kitchen Two times a day    Spironolactone 25 Mg Tabs (Spironolactone) .Marland Kitchen..Marland Kitchen Two times a day    Doxazosin Mesylate 4 Mg Tabs (Doxazosin mesylate) .Marland Kitchen... 1 by mouth at bedtime  Orders: Venipuncture (04540) TLB-BMP (Basic Metabolic Panel-BMET) (80048-METABOL) TLB-TSH  (Thyroid Stimulating Hormone) (84443-TSH)  Problem # 2:  HYPERLIPIDEMIA (ICD-272.4) due for labs Good compliance and tolerance with medications His updated medication list for this problem includes:    Simvastatin 10 Mg Tabs (Simvastatin) .Marland Kitchen... 1 by mouth qhs    Labs Reviewed: SGOT: 19 (11/26/2009)   SGPT: 17 (11/26/2009)  Orders: TLB-Lipid Panel (80061-LIPID)  Problem # 3:  DM (ICD-250.00) no change at this point His updated medication list for this problem includes:    Lantus 100 Unit/ml Soln (Insulin glargine) .Marland KitchenMarland KitchenMarland KitchenMarland Kitchen 75 units    Novolog 100 Unit/ml Soln (Insulin aspart) ..... Ss 110-150 - 5u 151-200 - 8u 201-250 12u 251-300 16u 301 -350 18u 351-400 22u >400 call md    Metformin Hcl 750 Mg Xr24h-tab (Metformin hcl) .Marland Kitchen... 1 by mouth qam    Glutose 15 40 % Gel (Dextrose (diabetic use)) .Marland Kitchen... Give 1 tube if cbg <60  Labs Reviewed: Creat: 1.60 (11/26/2009)    Reviewed HgBA1c results: 6.8 (11/26/2009)  Problem # 4:  GERD (ICD-530.81) continue with symptoms, on a max dose of PPIs Strategies to prevent reflux discussed Offered a GI referral, states at some point was offered a "stretching" but liked to hold off at present no odynophagia His updated medication list for this problem includes:    Pantoprazole Sodium 40 Mg Tbec (Pantoprazole sodium) ..... One twice a day on a  empty stomach    Problem # 5:  ATRIAL FIBRILLATION, PAROXYSMAL (ICD-427.31) on Coumadin, see instructions  His updated medication list for this problem includes:    Carvedilol 6.25 Mg Tabs (Carvedilol) .Marland Kitchen..Marland Kitchen Two times a day  Complete Medication List: 1)  Lantus 100 Unit/ml Soln (Insulin glargine) .... 75 units 2)  Novolog 100 Unit/ml Soln (Insulin aspart) .... Ss 110-150 - 5u 151-200 - 8u 201-250 12u 251-300 16u 301 -350 18u 351-400 22u >400 call md 3)  Metformin Hcl 750 Mg Xr24h-tab (Metformin hcl) .Marland Kitchen.. 1 by mouth qam 4)  Pepto-bismol 262 Mg/87ml Susp (Bismuth subsalicylate) .... As directed, prn 5)   Glutose 15 40 % Gel (Dextrose (diabetic use)) .... Give 1 tube if cbg <60 6)  Torsemide 100 Mg Tabs (Torsemide) .... 1/2 by mouth once daily 7)  Carvedilol 6.25 Mg Tabs (Carvedilol) .... Two times a day 8)  Spironolactone 25 Mg Tabs (Spironolactone) .... Two times a day 9)  Simvastatin 10 Mg Tabs (Simvastatin) .Marland Kitchen.. 1 by mouth qhs 10)  Sertraline Hcl 25 Mg Tabs (Sertraline hcl) .Marland Kitchen.. 1 by mouth once daily 11)  Donepezil Hcl 10 Mg Tabs (Donepezil hcl) .Marland Kitchen.. 1 by mouth at bedtime 12)  Vitamin D (ergocalciferol) 50000 Unit Caps (Ergocalciferol) .Marland Kitchen.. 1 by mouth every friday 13)  Ferrous Sulfate 325 (65 Fe) Mg Tabs (Ferrous sulfate) .Marland Kitchen.. 1 by mouth qam 14)  Pantoprazole Sodium 40 Mg Tbec (Pantoprazole sodium) .... One twice a day on a  empty stomach 15)  Multi-vitamin  .... Once daily 16)  Advair Diskus 100-50 Mcg/dose Aepb (Fluticasone-salmeterol) .... Two times a day 17)  Doxazosin Mesylate 4 Mg Tabs (Doxazosin mesylate) .Marland Kitchen.. 1 by mouth at bedtime 18)  Finasteride 5 Mg Tabs (Finasteride) .Marland Kitchen.. 1 by mouth qd 19)  Ventolin Hfa 108 (90 Base) Mcg/act Aers (Albuterol sulfate) .... 2 puffs every 4 hours as needed for sob or wheezing 20)  Simethicone 80 Mg Chew (Simethicone) .... Qid 21)  Astepro 0.15 % Soln (Azelastine hcl) .... Two sprays on each side of the nose twice a day 22)  Cetirizine Hcl 10 Mg Tabs (Cetirizine hcl) .Marland Kitchen.. 1 by mouth qhs 23)  Patanol 0.1 % Soln (Olopatadine hcl) .Marland Kitchen.. 1 gtt as needed for itching 24)  Senna-plus 8.6-50 Mg Tabs (Sennosides-docusate sodium) .... 2 by mouth at bedtime 25)  Coumadin 7mg   .... Once daily 26)  Miralax Powd (Polyethylene glycol 3350) .Marland Kitchen.. 17gms by mouth once daily as needed constipation 27)  Coricidin Hbp Cough/cold 4-30 Mg Tabs (Chlorpheniramine-dm) .Marland Kitchen.. 1 by mouth every 12 hours as needed cough & congestion 28)  Acetaminophen 160 Mg/85ml Susp (Acetaminophen) .Marland Kitchen.. 10ml every 4 hours as needed pain  Patient Instructions: 1)  your next INR/Coumadin check  should be 01-26-10. As the staff to fax me  the results 2)  continue same meds as it is 3)  please read the heartburn precautions 4)  Please schedule a follow-up appointment in 3 months .

## 2010-08-25 NOTE — Assessment & Plan Note (Signed)
Summary: INR CHECK//PH   Nurse Visit   Allergies: No Known Drug Allergies Laboratory Results   Blood Tests   Date/Time Received: Tyler [name] [facility]  August 16, 2010 11:22 AM  Date/Time Reported: Tyler [name] CMA  August 16, 2010 11:22 AM    INR: 1.9   (Normal Range: 0.88-1.12   Therap INR: 2.0-3.5) Comments: Patient has 3mg  and 4mg  tabs. He takes 7mg  by mouth once daily. Tyler [name] CMA  August 16, 2010 11:22 AM   Per MD, patient was advised to continue the same and re-check in 3 weeks. This was also documented on paperwork for facility.Tyler [name] CMA  August 16, 2010 11:27 AM     Orders Added: 1)  Est. Patient Level I [99211] 2)  Protime [16109UE]

## 2010-08-25 NOTE — Progress Notes (Signed)
Summary: INR 2.2---- next INR 08-14-10  ---- Converted from flag ---- ---- 06/18/2010 10:17 AM, Jose E. Paz MD wrote: inr due ------------------------------  I spoke w/ pt regarding his INR. Pt states that he went to a lab last thursday. Unsure of name he was dropped off and picked up. Pt is going to try to get results faxed to [place]. Army Fossa CMA  July 19, 2010 9:08 AM  I spoke w/ a nurse at [facility] and she is going to call [employer] to see what lab he was taken to but he did have it checked. Army Fossa CMA  July 19, 2010 9:10 AM  INR from 07-14-10 was 2.2 Advised caregivers: --I'm concern b/c I'm not getting reports in a timely manner --same dose --recheck 08-14-10 Jose E. Paz MD  July 19, 2010 11:12 AM   left message for nurse at [facility] to call back. Army Fossa CMA  July 19, 2010 11:29 AM  Patient daughter and nurse at [facility] notified. She is aware that results must get to [place] in a timely manner or we may have to let patient do PT checks [place]. At the time of call neither patient daughter nor nurse could tell me exactly where the patient was having it done. There is not a lab in the asisted living facility, so it is most likely being done at a [place] facility. Lucious Groves CMA  July 19, 2010 3:43 PM

## 2010-08-25 NOTE — Progress Notes (Signed)
Summary: PT order--[facility]  Phone Note From Other Clinic   Caller: FAITH WITH [facility]  [phone] Summary of Call: Faith with [facility] is calling to get Coumidin orders for this patient--please call her at [phone] Initial call taken by: Jerolyn Shin,  August 09, 2010 4:30 PM  Follow-up for Phone Call        Patient takes one 4mg  tab and one 3mg  tabs everyday. He is due for check again on 08/11/10. Verbal orders given.  Follow-up by: Lucious Groves CMA,  August 09, 2010 4:40 PM

## 2010-09-21 ENCOUNTER — Telehealth (INDEPENDENT_AMBULATORY_CARE_PROVIDER_SITE_OTHER): Payer: Self-pay | Admitting: *Deleted

## 2010-09-21 ENCOUNTER — Ambulatory Visit (INDEPENDENT_AMBULATORY_CARE_PROVIDER_SITE_OTHER): Payer: No Typology Code available for payment source

## 2010-09-21 ENCOUNTER — Encounter: Payer: Self-pay | Admitting: Internal Medicine

## 2010-09-21 DIAGNOSIS — Z5181 Encounter for therapeutic drug level monitoring: Secondary | ICD-10-CM

## 2010-09-21 DIAGNOSIS — I4891 Unspecified atrial fibrillation: Secondary | ICD-10-CM

## 2010-09-21 DIAGNOSIS — Z7901 Long term (current) use of anticoagulants: Secondary | ICD-10-CM

## 2010-09-23 ENCOUNTER — Emergency Department (HOSPITAL_COMMUNITY)
Admission: EM | Admit: 2010-09-23 | Discharge: 2010-09-24 | Disposition: A | Discharge status: Home / Self Care | Payer: Medicare Other | Attending: Emergency Medicine | Admitting: Emergency Medicine

## 2010-09-23 ENCOUNTER — Telehealth: Payer: Self-pay | Admitting: Internal Medicine

## 2010-09-23 DIAGNOSIS — K3184 Gastroparesis: Secondary | ICD-10-CM | POA: Insufficient documentation

## 2010-09-23 DIAGNOSIS — E119 Type 2 diabetes mellitus without complications: Secondary | ICD-10-CM | POA: Insufficient documentation

## 2010-09-23 DIAGNOSIS — E7800 Pure hypercholesterolemia, unspecified: Secondary | ICD-10-CM | POA: Insufficient documentation

## 2010-09-23 DIAGNOSIS — I10 Essential (primary) hypertension: Secondary | ICD-10-CM | POA: Insufficient documentation

## 2010-09-23 DIAGNOSIS — J45909 Unspecified asthma, uncomplicated: Secondary | ICD-10-CM | POA: Insufficient documentation

## 2010-09-23 DIAGNOSIS — I509 Heart failure, unspecified: Secondary | ICD-10-CM | POA: Insufficient documentation

## 2010-09-23 DIAGNOSIS — N400 Enlarged prostate without lower urinary tract symptoms: Secondary | ICD-10-CM | POA: Insufficient documentation

## 2010-09-23 DIAGNOSIS — I4891 Unspecified atrial fibrillation: Secondary | ICD-10-CM | POA: Insufficient documentation

## 2010-09-23 LAB — GLUCOSE, CAPILLARY
Glucose-Capillary: 413 mg/dL — ABNORMAL HIGH (ref 70–99)
Glucose-Capillary: 459 mg/dL — ABNORMAL HIGH (ref 70–99)
Glucose-Capillary: 254 mg/dL — ABNORMAL HIGH (ref 70–99)

## 2010-09-23 LAB — POCT I-STAT, CHEM 8
BUN: 26 mg/dL — ABNORMAL HIGH (ref 6–23)
Calcium, Ion: 1.07 mmol/L — ABNORMAL LOW (ref 1.12–1.32)
HCT: 34 % — ABNORMAL LOW (ref 39.0–52.0)
TCO2: 26 mmol/L (ref 0–100)

## 2010-09-26 ENCOUNTER — Telehealth (INDEPENDENT_AMBULATORY_CARE_PROVIDER_SITE_OTHER): Payer: Self-pay | Admitting: *Deleted

## 2010-09-29 NOTE — Miscellaneous (Signed)
Summary: Coumadin Orders/[facility]  Coumadin Orders/[facility] [place] [facility]   Imported By: Maryln Gottron 09/23/2010 12:37:15  _____________________________________________________________________  External Attachment:    Type:   Image     Comment:   External Document

## 2010-09-29 NOTE — Progress Notes (Signed)
Summary: PT appt (lmom 2/29)   Phone Note From Other Clinic   Caller: [facility]  Summary of Call: Message was left on VM that per family request they want to come here verses another clinic to have his PT done. Need to know if anyone has checked it since 08/16/10 and what the other clinic they are referrring to. Should be fine as long as another doctor is not keeping up w/ pts Coumadin. I left message for his nurse to call back.  Army Fossa CMA  September 21, 2010 1:21 PM   Follow-up for Phone Call        inr checked here today Jose E. Paz MD  September 21, 2010 2:45 PM

## 2010-09-29 NOTE — Miscellaneous (Signed)
Summary: Coumadin Orders/[facility]  Coumadin Orders/[facility]   Imported By: Maryln Gottron 09/23/2010 12:31:50  _____________________________________________________________________  External Attachment:    Type:   Image     Comment:   External Document

## 2010-09-30 ENCOUNTER — Encounter (INDEPENDENT_AMBULATORY_CARE_PROVIDER_SITE_OTHER): Payer: Self-pay | Admitting: *Deleted

## 2010-09-30 ENCOUNTER — Encounter: Payer: Self-pay | Admitting: Internal Medicine

## 2010-10-04 NOTE — Medication Information (Signed)
Summary: pt check///sph  Anticoagulant Therapy  Managed by: Willow Ora, MD Indication 1: Atrial Fibrillation paroxsymal INR POC 2.4  Vital Signs: Weight: 260 lbs.  Blood Pressure:  120 / 60   Dietary changes: no    Health status changes: no    Bleeding/hemorrhagic complications: no    Recent/future hospitalizations: no    Any changes in medication regimen? no    Recent/future dental: no  Any missed doses?: no       Is patient compliant with meds? yes      Comments: current dose- 7mg  daily  patient advised no change recheck in 3 weeks.....Marland KitchenMarland KitchenDoristine Devoid CMA  September 21, 2010 2:53 PM  [place] E. Ariella Voit MD  September 26, 2010 8:39 AM   Allergies: No Known Drug Allergies  Anticoagulation Management History:      Positive risk factors for bleeding include an age of 75 years or older and presence of serious comorbidities.  The bleeding index is 'intermediate risk'.  Positive CHADS2 values include History of HTN, Age > 59 years old, and History of Diabetes.  His last INR was 1.52.  INR POC: 2.4.    Anticoagulation Management Assessment/Plan:      Results were reviewed/authorized by Willow Ora, MD.

## 2010-10-04 NOTE — Progress Notes (Signed)
Summary: BS high-FYI  Phone Note From Other Clinic   Caller: [facility] Summary of Call: [facility] called and states that pts BS is 430. On average taking 8 untis of novolog a day. Per Dr.Hopper he needs 22 units before dinner tonight. Starting tomorrow he needs 04-28-09 units of novolog. Advised he needs an appt- they will tell pts daughter faxed over order. Army Fossa CMA  September 23, 2010 4:57 PM     New/Updated Medications: NOVOLOG 100 UNIT/ML SOLN (INSULIN ASPART) 10 u before breakfast- 6 before lunch- 10 before dinner Prescriptions: NOVOLOG 100 UNIT/ML SOLN (INSULIN ASPART) 10 u before breakfast- 6 before lunch- 10 before dinner  #0 x 0   Entered by:   Army Fossa CMA   Authorized by:   Nolon Rod. Paz MD   Signed by:   Army Fossa CMA on 09/23/2010   Method used:   Print then Give to Patient   RxID:   [id number]

## 2010-10-04 NOTE — Letter (Signed)
Summary: Generic Letter   at [place]  [address] [place], [place] [address]   Phone: [phone]  Fax: [phone]    09/30/2010  [facility] [address] [place], [place]  [address]  [place]  To Whom This May Concern:  Dr. Drue Novel has reviewed blood sugar readings and these are his recommendations.   AM CBGs varies from 67 to 206 before lunch  ~ 200 before dinner mostly 200-300  bedtime 300 to 140 plan: continue lantus  stop s/s , instead provide novolog as follows 6 u before breakfast, 10 u before lunch, 10 before dinner . at bedtime---If CBG 200 to 300 give  5 units, if sugar 300 to 400 give 8 u, if >400  give 12u and call MD. call w/ CBGs in 1 week Jose E. Paz MD  September 30, 2010 4:50 PM         Sincerely,   [name]  Appended Document: Generic Letter faxed to [facility]

## 2010-10-04 NOTE — Progress Notes (Signed)
Summary: call-a-nurse-lmom 3/9  Phone Note Outgoing Call   Details for Reason: [facility] Triage Call Report Triage Record Num: [id number] Operator: Karenann Cai Patient Name: Tyler Tyler [name] Call Date & Time: 09/24/2010 12:26:37PM Patient Phone: [phone] PCP: Tyler Tyler [name] Patient Gender: Male PCP Fax : Patient DOB: 11-09-1933 Practice Name: [facility] Reason for Call: [name] Nurse is calling from the facility. Blood sugar=231 at 11:00. New sliding scale order: Novolog 6 units before lunch was for yesterday on 3/22. Facility Nurse is following original sliding scale orders. RN advises Facility Nurse to call for new orders on Monday. Protocol(s) Used: Medication Question Calls, No Triage (Adults) Recommended Outcome per Protocol: Provide Information or Advice Only Reason for Outcome: Caller has medication question only and triager answers question Care Advice: Summary of Call: [facility] Triage Call Report Triage Record Num: [id number] Operator: Karenann Cai Patient Name: Tyler Tyler [name] Call Date & Time: 09/24/2010 8:32:23AM Patient Phone: [phone] PCP: Tyler Tyler [name] Patient Gender: Male PCP Fax : Patient DOB: 08-22-1933 Practice Name: [facility] Reason for Call: [name], Facility RN is calling for order clarification that was received on 09/23/2010: Novolog 22 units now due to elevated blood sugar then sliding scale 10 units before breakfast, 6 units before lunch and 10 units before dinner. Order was faxed to facility and left off the Novolog 22 units on 09/23/2010. Patient was sent to [facility] ER the evening of 09/23/2010. Med Tech at the facility was unable to administer the Novolog 22 units due to this order not being included on the fax. Med Tech attempted to contact office RN unsuccessfully and patient's blood sugar remained elevated. Family decision to have patient evaluated at the ER. Patient was sent back to the facility after  evaluation. No orders received from [facility] ER visit. RN advises to use last sliding scale order received from Dr. Drue Novel on 09/23/2010. RN advises to continue following original orders from PCP. Protocol(s) Used: Office Note Recommended Outcome per Protocol: Information Noted and Sent to Office  Follow-up for Phone Call        please check on patient: current CBGs? current Insulin dose? Tyler Tyler [name]  September 26, 2010 8:40 AM  Called assisted living facility and left msg w/ staff to have med tech return call .......Marland KitchenDoristine Devoid CMA  September 26, 2010 8:46 AM   called assisted living was able to get in touch with med tech and waiting for information to be faxed about BS and current dose of medication ....Marland KitchenMarland KitchenDoristine Devoid CMA  September 28, 2010 8:42 AM   called assisted living again was transfered to voicemail and left detailed msg next attemp to call if no call back today will speak w/ office manager...Marland KitchenMarland KitchenDoristine Devoid CMA  September 30, 2010 2:25 PM   called patient daughter [name] to see if she could obtain information left message on machine .Marland KitchenDoristine Devoid CMA  September 30, 2010 3:18 PM   spoke w/ patient daughter made her aware of attempts to reach [name] says that she will f/u up and have information to me by monday.....Marland KitchenMarland KitchenDoristine Devoid CMA  September 30, 2010 4:07 PM   Additional Follow-up for Phone Call Additional follow up Details #1::        AM CBGs varies from 67 to 206 before lunch  ~ 200 before dinner mostly 200-300  bedtime 300 to 140 plan: continue lantus  stop s/s , instead provide novolog as follows 6 u before  breakfast, 10 u before lunch, 10 before dinner . at bedtime---If CBG 200 to 300 give  5 units, if sugar 300 to 400 give 8 u, if >400  give 12u and call Tyler [name]. call w/ CBGs in 1 week Tyler Tyler [name]  September 30, 2010 4:50 PM   information faxed to [facility].Marland KitchenMarland KitchenMarland KitchenDoristine Devoid CMA  September 30, 2010 4:57 PM     New/Updated Medications: NOVOLOG 100 UNIT/ML SOLN (INSULIN ASPART) 6 u before  breakfast, 10 u before lunch, 10 before dinner . at bedtime---If CBG 200 to 300 give  5 units, if sugar 300 to 400 give 8 u, if >400  give 12u and call Tyler [name]

## 2010-10-07 ENCOUNTER — Telehealth (INDEPENDENT_AMBULATORY_CARE_PROVIDER_SITE_OTHER): Payer: Self-pay | Admitting: *Deleted

## 2010-10-07 ENCOUNTER — Other Ambulatory Visit: Payer: Self-pay | Admitting: Internal Medicine

## 2010-10-07 ENCOUNTER — Telehealth: Payer: Self-pay | Admitting: Internal Medicine

## 2010-10-07 ENCOUNTER — Encounter: Payer: Self-pay | Admitting: Internal Medicine

## 2010-10-07 ENCOUNTER — Emergency Department (HOSPITAL_COMMUNITY)
Admission: EM | Admit: 2010-10-07 | Discharge: 2010-10-07 | Disposition: A | Discharge status: Home / Self Care | Payer: Medicare Other | Attending: Emergency Medicine | Admitting: Emergency Medicine

## 2010-10-07 ENCOUNTER — Ambulatory Visit (INDEPENDENT_AMBULATORY_CARE_PROVIDER_SITE_OTHER)
Admission: RE | Admit: 2010-10-07 | Discharge: 2010-10-07 | Disposition: A | Discharge status: Home / Self Care | Payer: Medicare Other | Source: Ambulatory Visit | Attending: Internal Medicine | Admitting: Internal Medicine

## 2010-10-07 ENCOUNTER — Ambulatory Visit (INDEPENDENT_AMBULATORY_CARE_PROVIDER_SITE_OTHER): Payer: Medicare Other | Admitting: Internal Medicine

## 2010-10-07 ENCOUNTER — Emergency Department (HOSPITAL_COMMUNITY): Payer: Medicare Other

## 2010-10-07 DIAGNOSIS — E119 Type 2 diabetes mellitus without complications: Secondary | ICD-10-CM

## 2010-10-07 DIAGNOSIS — I509 Heart failure, unspecified: Secondary | ICD-10-CM | POA: Insufficient documentation

## 2010-10-07 DIAGNOSIS — I10 Essential (primary) hypertension: Secondary | ICD-10-CM

## 2010-10-07 DIAGNOSIS — J45909 Unspecified asthma, uncomplicated: Secondary | ICD-10-CM | POA: Insufficient documentation

## 2010-10-07 DIAGNOSIS — K219 Gastro-esophageal reflux disease without esophagitis: Secondary | ICD-10-CM | POA: Insufficient documentation

## 2010-10-07 DIAGNOSIS — E860 Dehydration: Secondary | ICD-10-CM | POA: Insufficient documentation

## 2010-10-07 DIAGNOSIS — Z5181 Encounter for therapeutic drug level monitoring: Secondary | ICD-10-CM

## 2010-10-07 DIAGNOSIS — N400 Enlarged prostate without lower urinary tract symptoms: Secondary | ICD-10-CM | POA: Insufficient documentation

## 2010-10-07 DIAGNOSIS — K3184 Gastroparesis: Secondary | ICD-10-CM | POA: Insufficient documentation

## 2010-10-07 DIAGNOSIS — I4891 Unspecified atrial fibrillation: Secondary | ICD-10-CM | POA: Insufficient documentation

## 2010-10-07 DIAGNOSIS — R5381 Other malaise: Secondary | ICD-10-CM | POA: Insufficient documentation

## 2010-10-07 DIAGNOSIS — Z7901 Long term (current) use of anticoagulants: Secondary | ICD-10-CM

## 2010-10-07 DIAGNOSIS — Z794 Long term (current) use of insulin: Secondary | ICD-10-CM | POA: Insufficient documentation

## 2010-10-07 DIAGNOSIS — E7800 Pure hypercholesterolemia, unspecified: Secondary | ICD-10-CM | POA: Insufficient documentation

## 2010-10-07 LAB — URINALYSIS, ROUTINE W REFLEX MICROSCOPIC
Bilirubin Urine: NEGATIVE
Glucose, UA: 500 mg/dL — AB
Ketones, ur: 15 mg/dL — AB
Specific Gravity, Urine: 1.018 (ref 1.005–1.030)
pH: 5 (ref 5.0–8.0)

## 2010-10-07 LAB — CBC WITH DIFFERENTIAL/PLATELET
Basophils Absolute: 0 10*3/uL (ref 0.0–0.1)
Eosinophils Absolute: 0.2 10*3/uL (ref 0.0–0.7)
HCT: 35.6 % — ABNORMAL LOW (ref 39.0–52.0)
Lymphs Abs: 0.9 10*3/uL (ref 0.7–4.0)
MCHC: 34.5 g/dL (ref 30.0–36.0)
Monocytes Absolute: 0.3 10*3/uL (ref 0.1–1.0)
Monocytes Relative: 4 % (ref 3.0–12.0)
Platelets: 179 10*3/uL (ref 150.0–400.0)
RDW: 12.6 % (ref 11.5–14.6)

## 2010-10-07 LAB — BASIC METABOLIC PANEL
BUN: 33 mg/dL — ABNORMAL HIGH (ref 6–23)
Chloride: 95 mEq/L — ABNORMAL LOW (ref 96–112)
Creatinine, Ser: 2 mg/dL — ABNORMAL HIGH (ref 0.4–1.5)

## 2010-10-07 LAB — GLUCOSE, CAPILLARY

## 2010-10-07 LAB — POCT I-STAT, CHEM 8
BUN: 55 mg/dL — ABNORMAL HIGH (ref 6–23)
Calcium, Ion: 1.11 mmol/L — ABNORMAL LOW (ref 1.12–1.32)
Chloride: 97 mEq/L (ref 96–112)
Glucose, Bld: 375 mg/dL — ABNORMAL HIGH (ref 70–99)

## 2010-10-07 LAB — HEMOGLOBIN A1C: Hgb A1c MFr Bld: 7.6 % — ABNORMAL HIGH (ref 4.6–6.5)

## 2010-10-07 LAB — CONVERTED CEMR LAB: Blood Glucose, Fingerstick: 509

## 2010-10-10 ENCOUNTER — Telehealth (INDEPENDENT_AMBULATORY_CARE_PROVIDER_SITE_OTHER): Payer: Self-pay | Admitting: *Deleted

## 2010-10-11 NOTE — Letter (Signed)
Summary: Blood Glucose Tracking   Blood Glucose Tracking   Imported By: Kassie Mends 10/07/2010 08:04:59  _____________________________________________________________________  External Attachment:    Type:   Image     Comment:   External Document

## 2010-10-11 NOTE — Assessment & Plan Note (Signed)
Summary: elevated blood sugars/cdj   Vital Signs:  Patient profile:   77 year old male Weight:      260 pounds Pulse rate:   71 / minute Pulse rhythm:   regular BP sitting:   130 / 86  (left arm) Cuff size:   large  Vitals Entered By: Army Fossa CMA (October 07, 2010 9:59 AM) CC: BS running high fasting  CBG Result 509   History of Present Illness:  here with his daughter, his blood sugars has been very high lately. Yesterday his blood sugar in the morning was 582   today 509   my latest recommendation on insulin on March 9 was as follows: continue lantus  stop s/s , instead provide novolog as follows 6 u before breakfast 10 u before lunch 10 before dinner  at bedtime---If CBG 200 to 300 give  5 units, if sugar 300 to 400 give 8 u, if >400  give 12u and call MD.   according to the "blood glucose traking"  notes,   NovoLog has not been  provided as prescribed  however MAR document good medication compliance   patient's daughter reports that he was doing much better with a previous sliding scale.  Review of systems No fevers has chronic runny nose and mild cough, but that is unchanged  appetite is decreased compared to a few weeks ago. He has more GERD No dysuria, gross hematuria or difficulty urinating  no skin infections that he can report   Current Medications (verified): 1)  Lantus 100 Unit/ml Soln (Insulin Glargine) .... 75 Units 2)  Novolog 100 Unit/ml Soln (Insulin Aspart) .... 6 U Before Breakfast, 10 U Before Lunch, 10 Before Dinner . At Bedtime---If Cbg 200 To 300 Give  5 Units, If Sugar 300 To 400 Give 8 U, If >400  Give 12u and Call Md 3)  Metformin Hcl 750 Mg Xr24h-Tab (Metformin Hcl) .Marland Kitchen.. 1 By Mouth Qam 4)  Pepto-Bismol 262 Mg/76ml Susp (Bismuth Subsalicylate) .... As Directed, Prn 5)  Glutose 15 40 % Gel (Dextrose (Diabetic Use)) .... Give 1 Tube If Cbg <60 6)  Torsemide 100 Mg Tabs (Torsemide) .... 1/2 By Mouth Once Daily 7)  Carvedilol 6.25 Mg Tabs  (Carvedilol) .... Two Times A Day 8)  Spironolactone 25 Mg Tabs (Spironolactone) .... Two Times A Day 9)  Zocor 20 Mg Tabs (Simvastatin) .Marland Kitchen.. 1 By Mouth At Bedtime. 10)  Sertraline Hcl 25 Mg Tabs (Sertraline Hcl) .Marland Kitchen.. 1 By Mouth Once Daily 11)  Donepezil Hcl 10 Mg Tabs (Donepezil Hcl) .Marland Kitchen.. 1 By Mouth At Bedtime 12)  Vitamin D (Ergocalciferol) 50000 Unit Caps (Ergocalciferol) .Marland Kitchen.. 1 By Mouth Every Friday 13)  Ferrous Sulfate 325 (65 Fe) Mg Tabs (Ferrous Sulfate) .Marland Kitchen.. 1 By Mouth Qam 14)  Pantoprazole Sodium 40 Mg Tbec (Pantoprazole Sodium) .... One Twice A Day On A  Empty Stomach 15)  Multi-Vitamin .... Once Daily 16)  Advair Diskus 100-50 Mcg/dose Aepb (Fluticasone-Salmeterol) .... Two Times A Day 17)  Doxazosin Mesylate 4 Mg Tabs (Doxazosin Mesylate) .Marland Kitchen.. 1 By Mouth At Bedtime 18)  Finasteride 5 Mg Tabs (Finasteride) .Marland Kitchen.. 1 By Mouth Qd 19)  Ventolin Hfa 108 (90 Base) Mcg/act Aers (Albuterol Sulfate) .... 2 Puffs Every 4 Hours As Needed For Sob or Wheezing 20)  Simethicone 80 Mg Chew (Simethicone) .... Qid 21)  Senna-Plus 8.6-50 Mg Tabs (Sennosides-Docusate Sodium) .... 2 By Mouth At Bedtime 22)  Coumadin 7mg  .... Once Daily 23)  Miralax  Powd (Polyethylene Glycol 3350) .Marland KitchenMarland KitchenMarland Kitchen  17gms By Mouth Once Daily As Needed Constipation 24)  Acetaminophen 160 Mg/8ml Susp (Acetaminophen) .Marland Kitchen.. 10ml Every 4 Hours As Needed Pain 25)  Pt/inr Check .... Please Check Pt/inr As Needed X 6 Months. Dx: V58.61/ 427.31 26)  Pt Orders .... Take Extra 3mg  Tab Today, Resume Normal Dosing Tomorrow-- Recheck in 2 Weeks. 27)  Systane 28)  Flonase 50 Mcg/act Susp (Fluticasone Propionate) .... 2 Sprays On Each Side of The Nose Daily 29)  Artificial Tears  Soln (Artificial Tear Solution) .... Apply  To Both Eyes As Frequent As Every 2 Hours P.r.n. Dry Eye 30)  Novolog 100 Unit/ml Soln (Insulin Aspart) .Marland Kitchen.. 10 U Before Breakfast- 6 Before Lunch- 10 Before Dinner  Allergies (verified): No Known Drug Allergies  Past  History:  Past Medical History: Reviewed history from 01/11/2010 and no changes required. Diabetes mellitus, type II (1980) Hyperlipidemia (1980) Hypertension (1980) Asthma Allergic rhinitis GERD Benign prostatic hypertrophy s/p ?thermo therapy, has bladdero utlet obst. and instability ( incontinent) Anemia-NOS chronic constipation Chronic increased alkaline phosphate ------------------------------------------------------------------- Cardiac history is extensive. see also past surgical history sick sinus syndrome  Paroxysmal atrial fibrillation status post direct cardioversion normal cardiac catheterization in 2003 with normal  left ventricular function (done after an abnormal Cardiolite)  history of severe  bradycardia on high dose AV-nodal blockers.   chronic bifascicular  block -------------------------------------------------------------------- Morbid obesity. sleep apnea with noncompliance with CPAP h/o  hypoxia in the past, likely multifactorial Gastroparesis diagnosed in 2006 through gastric scan. gynecomastia, normal mammograms 11/2008    Past Surgical History: Reviewed history from 11/26/2009 and no changes required. Cataract extraction Rotator cuff repair (R) negative colonoscopy 07/2003 normal cardiac catheterization in 2003 with normal  left ventricular function (done after an abnormal Cardiolite) negative Cardiolite 05/2004 normal ABIs 11-2005 low risk Cardiolite 05/2006  Family History: Reviewed history from 11/26/2009 and no changes required. colon ca--no prostate ca-- uncle   Social History: Reviewed history from 11/26/2009 and no changes required. has lived in a facility x aprox 4 years Moved to [place] 08-2008 widower children -- 2 daughters tobacco-- remotely ETOH-- not in a while Does not have a POA  Does not have a living will Grant, daughter, [phone]  Physical Exam  General:  alert and well-developed.  no apparent distress Lungs:  normal  respiratory effort, no intercostal retractions, and no accessory muscle use.  slightly decreased breath sounds  . No increased work of breathing Heart:  iregular rate and rhythm, no murmur Extremities:   no edema  lower extremities are inspected, no lesions.  feet without lesions or evidence of cellulitis   Impression & Recommendations:  Problem # 1:  DM (ICD-250.00)  poorly controlled diabetes  for the last 2 weeks.  he went to the ER 2 weeks ago, after that he is CBGs were okay x few days  For the last 3 days, CBGs are in the 500 range in the morning.  patient seems asymptomatic otherwise. Plan:  8 units of  regular insulin now Labs  increase Lantus to 85 Go back to her previous sliding scale-- d/w daughter, she doesn't like dad to go to the ER unless CBG >500 x 4 times  Chest x-ray, urinalysis and CBC to rule out infection  if no better during the weekend, need to go to the ER  if unable to control in the near future, will refer to endocrinology   His updated medication list for this problem includes:    Lantus 100 Unit/ml Soln (Insulin glargine) .Marland KitchenMarland KitchenMarland KitchenMarland Kitchen  85 units    Novolog 100 Unit/ml Soln (Insulin aspart) ..... See instructions    Metformin Hcl 750 Mg Xr24h-tab (Metformin hcl) .Marland Kitchen... 1 by mouth qam    Glutose 15 40 % Gel (Dextrose (diabetic use)) .Marland Kitchen... Give 1 tube if cbg <60    Novolog 100 Unit/ml Soln (Insulin aspart) .Marland KitchenMarland KitchenMarland KitchenMarland Kitchen 10 u before breakfast- 6 before lunch- 10 before dinner  Orders: Venipuncture ([address]) TLB-A1C / Hgb A1C (Glycohemoglobin) (83036-A1C) TLB-CBC Platelet - w/Differential (85025-CBCD) T-2 View CXR (71020TC) Specimen Handling (60454)  Problem # 2:  F2F > 40 min  do to extensive review of papers from his ALF,  a discussion about the plan with the patient's daughter  Complete Medication List: 1)  Lantus 100 Unit/ml Soln (Insulin glargine) .... 85 units 2)  Novolog 100 Unit/ml Soln (Insulin aspart) .... See instructions 3)  Metformin Hcl 750 Mg Xr24h-tab  (Metformin hcl) .Marland Kitchen.. 1 by mouth qam 4)  Pepto-bismol 262 Mg/3ml Susp (Bismuth subsalicylate) .... As directed, prn 5)  Glutose 15 40 % Gel (Dextrose (diabetic use)) .... Give 1 tube if cbg <60 6)  Torsemide 100 Mg Tabs (Torsemide) .... 1/2 by mouth once daily 7)  Carvedilol 6.25 Mg Tabs (Carvedilol) .... Two times a day 8)  Spironolactone 25 Mg Tabs (Spironolactone) .... Two times a day 9)  Zocor 20 Mg Tabs (Simvastatin) .Marland Kitchen.. 1 by mouth at bedtime. 10)  Sertraline Hcl 25 Mg Tabs (Sertraline hcl) .Marland Kitchen.. 1 by mouth once daily 11)  Donepezil Hcl 10 Mg Tabs (Donepezil hcl) .Marland Kitchen.. 1 by mouth at bedtime 12)  Vitamin D (ergocalciferol) 50000 Unit Caps (Ergocalciferol) .Marland Kitchen.. 1 by mouth every friday 13)  Ferrous Sulfate 325 (65 Fe) Mg Tabs (Ferrous sulfate) .Marland Kitchen.. 1 by mouth qam 14)  Pantoprazole Sodium 40 Mg Tbec (Pantoprazole sodium) .... One twice a day on a  empty stomach 15)  Multi-vitamin  .... Once daily 16)  Advair Diskus 100-50 Mcg/dose Aepb (Fluticasone-salmeterol) .... Two times a day 17)  Doxazosin Mesylate 4 Mg Tabs (Doxazosin mesylate) .Marland Kitchen.. 1 by mouth at bedtime 18)  Finasteride 5 Mg Tabs (Finasteride) .Marland Kitchen.. 1 by mouth qd 19)  Ventolin Hfa 108 (90 Base) Mcg/act Aers (Albuterol sulfate) .... 2 puffs every 4 hours as needed for sob or wheezing 20)  Simethicone 80 Mg Chew (Simethicone) .... Qid 21)  Senna-plus 8.6-50 Mg Tabs (Sennosides-docusate sodium) .... 2 by mouth at bedtime 22)  Coumadin 7mg   .... Once daily 23)  Miralax Powd (Polyethylene glycol 3350) .Marland Kitchen.. 17gms by mouth once daily as needed constipation 24)  Acetaminophen 160 Mg/89ml Susp (Acetaminophen) .Marland Kitchen.. 10ml every 4 hours as needed pain 25)  Pt/inr Check  .... Please check pt/inr as needed x 6 months. dx: v58.61/ 427.31 26)  Pt Orders  .... Take extra 3mg  tab today, resume normal dosing tomorrow-- recheck in 2 weeks. 27)  Systane  28)  Flonase 50 Mcg/act Susp (Fluticasone propionate) .... 2 sprays on each side of the nose daily 29)   Artificial Tears Soln (Artificial tear solution) .... Apply  to both eyes as frequent as every 2 hours p.r.n. dry eye 30)  Novolog 100 Unit/ml Soln (Insulin aspart) .Marland Kitchen.. 10 u before breakfast- 6 before lunch- 10 before dinner  Other Orders: Capillary Blood Glucose/CBG (09811) Protime (91478GN) TLB-BMP (Basic Metabolic Panel-BMET) (80048-METABOL)  Patient Instructions: 1)  Lantus 85 units daily 2)   sliding scale, 4 times a day  3)   110 -150 --------5 units 4)  151-200----------8 units  5)  201-250 -----------12  units  6)  251-300------------16  units 7)  301-350------------18 units 8)  351 -400 ---------------22 units  9)  More than 400 call M.D. 10)  if the CBGs are more than 500  4 times  in a row, call daughter and  go to  the ER 11)   please get a UA and urine culture  12)   call with blood sugar readings in 3 days (Monday )   Orders Added: 1)  Capillary Blood Glucose/CBG [82948] 2)  Protime [[address]UE] 3)  Venipuncture [45409] 4)  TLB-BMP (Basic Metabolic Panel-BMET) [80048-METABOL] 5)  TLB-A1C / Hgb A1C (Glycohemoglobin) [83036-A1C] 6)  TLB-CBC Platelet - w/Differential [85025-CBCD] 7)  T-2 View CXR [71020TC] 8)  Specimen Handling [99000] 9)  Est. Patient Level IV [81191]    Laboratory Results   Blood Tests     CBG Random:: 509mg /dL  INR: 2.6   (Normal Range: 0.88-1.12   Therap INR: 2.0-3.5) Comments: 7 mg daily

## 2010-10-11 NOTE — Progress Notes (Signed)
Summary: call-a-nurse  Phone Note Outgoing Call   Details for Reason: [facility] Triage Call Report Triage Record Num: [id number] Operator: Edythe Lynn Patient Name: Tyler [name] Call Date & Time: 10/06/2010 7:54:11AM Patient Phone: [phone] PCP: Nolon Rod. Paz Patient Gender: Male PCP Fax : Patient DOB: 15-Dec-1933 Practice Name: [facility] Reason for Call: RN Carolan Clines from [facility] says pt's blood sugar is 484. Asymptomatic. Advised 12 units humalog now per standing order.. RN to recheck blood sugar in 1 and 4hrs. To call back if needed. RN to advise office. Protocol(s) Used: Diabetes: Control Problems Recommended Outcome per Protocol: See Provider within 24 hours Reason for Outcome: All other situations Care Advice:  ~ 03/ Call-A-Nurse Triage Call Report Triage Record Num: [id number] Operator: Edythe Lynn Patient Name: [name] Call Date & Time: 10/07/2010 7:37:17AM Patient Phone: [phone] PCP: Nolon Rod. Paz Patient Gender: Male PCP Fax : Patient DOB: Mar 08, 1934 Practice Name: [facility] Reason for Call: Camila Med Tech from [facility] says pt has blood sugar of over 600. Asymptomatic. Pt and daughter declines ED . Med Tech has given 6 units novalog. Advised 6 more units and am diabetic oral agents/ long acting insulin. To call office at 0800 for further orders. Protocol(s) Used: Diabetes: Control Problems Recommended Outcome per Protocol: Call Provider Immediately Reason for Outcome: Blood sugar over 400 mg/dl when tested before breakfast Care Advice: Summary of Call: spoke w/ patient daughter and would like father seen appt scheduled for today ...Marland KitchenMarland KitchenDoristine Devoid CMA  October 07, 2010 8:46 AM

## 2010-10-19 ENCOUNTER — Telehealth: Payer: Self-pay | Admitting: Internal Medicine

## 2010-10-19 NOTE — Telephone Encounter (Signed)
The patient was seen on 3-16, diabetes treatment was changed, please check him again, asked his nurse how his blood sugars are [facility]

## 2010-10-19 NOTE — Telephone Encounter (Signed)
Spoke w/ Durward Mallard, she is going to fax over pts recent CBGs.

## 2010-10-19 NOTE — Telephone Encounter (Signed)
Left message for nurse to call back.  

## 2010-10-19 NOTE — Telephone Encounter (Signed)
Did not receive fax on CBGs, placed a call to [facility] to refax over results.

## 2010-10-20 MED ORDER — INSULIN GLARGINE 100 UNIT/ML ~~LOC~~ SOLN: 78.0000 [IU] | Freq: Every day | SUBCUTANEOUS | Status: DC

## 2010-10-20 NOTE — Telephone Encounter (Signed)
CBGs review it In the last few days he had low blood sugars in the morning. Blood sugar before lunch 242, 1 IgA, 192. Blood sugar before dinner 212, 164, 218 Blood sugar before bedtime 188, 165, 228 Plan: ---Decrease Lantus from 85-78  ---Current sliding  Scale 4 times a day  3)   110 -150 --------5 units 4)  151-200----------8 units  5)  201-250 -----------12 units  6)  251-300------------16  units 7)  301-350------------18 units 8)  351 -400 ---------------22 units  9)  More than 400 call M.D. 10)  if the CBGs are more than 500  4 times  in a row, call daughter and  go to  the ER  ---NEW sliding scale 3)   110 -150 --------8units 4)  151-200----------11units  5)  201-250 -----------15 units  6)  251-300------------19  units 7)  301-350------------22 units 8)  351 -400 ---------------25 units  9)  More than 400 call M.D. 10)  if the CBGs are more than 500  4 times  in a row, call daughter and  go to  the ER  Notify caregivers all for both They need to call us in 10 days with CBGs

## 2010-10-20 NOTE — Progress Notes (Signed)
Summary: cant understand orders   Phone Note Call from Patient   Caller: Patient Summary of Call: [facility] called to state she cannot understand the instructions written on the back of his orders----I called her number [phone] and got a recording---I left the message to fax the info to [place] that she cannot understand to Danielle's fax = [phone] Initial call taken by: Jerolyn Shin,  October 07, 2010 4:20 PM  Follow-up for Phone Call        I spoke w/ Dr.Coretta Leisey- per dr Drue Novel he hand wrote his Coumadin orders on the back- which was to stay the same. I left a message as well for them to call back. Army Fossa CMA  October 07, 2010 4:21 PM   Additional Follow-up for Phone Call Additional follow up Details #1::        Left message for Nursing home to call back. Army Fossa CMA  October 10, 2010 8:42 AM     Additional Follow-up for Phone Call Additional follow up Details #2::    Spoke w/ Durward Mallard they are aware of orders. They know he is supposed to do the sliding scale 4x a day. Is he still supposed to do the 10u before breakfast 6 u before lunch and 10 u before dinner? Please advise. They will fax over BS Readings. Army Fossa CMA  October 10, 2010 9:47 AM   no, just sliding scale. The daughter reported that he was doing better that way Leonore Frankson E. Giovannina Mun MD  October 10, 2010 12:48 PM   Additional Follow-up for Phone Call Additional follow up Details #3:: Details for Additional Follow-up Action Taken: Spoke w/ Durward Mallard and she is aware. Will fax the phone not to her. Army Fossa CMA  October 10, 2010 1:23 PM

## 2010-10-20 NOTE — Telephone Encounter (Signed)
[facility] aware we are decreasing the Lantus. Will fax over order.

## 2010-10-20 NOTE — Progress Notes (Signed)
  Phone Note Outgoing Call   Details for Reason: [facility] Triage Call Report Triage Record Num: [id number] Operator: Elita Boone Patient Name: Tyler [name] Call Date & Time: 10/07/2010 5:34:20PM Patient Phone: [phone] PCP: Nolon Rod. Paz Patient Gender: Male PCP Fax : Patient DOB: 08/22/1932 Practice Name: [facility] Reason for Call: Stasia Cavalier, RN/ [facility] pt was seen in MD office due to elevated BS ( 551 @ 1705) gave 22 units of Novalog, pt is asymptomatic. BS is 546. Facility received orders today from MD office if BS is greater than 500 4 times in a row that pt was to seen in ED. Stasia Cavalier, RN instructed to follow New MD orders. of 03/16. Protocol(s) Used: Diabetes: Control Problems Recommended Outcome per Protocol: See Provider within 72 Hours Reason for Outcome: Requesting information Care Advice:  ~ 03/  Follow-up for Phone Call        information in process of being clarified see previous phone note.....Marland KitchenMarland KitchenDoristine Devoid CMA  October 10, 2010 8:43 AM

## 2010-11-09 ENCOUNTER — Telehealth: Payer: Self-pay | Admitting: Internal Medicine

## 2010-11-09 ENCOUNTER — Other Ambulatory Visit: Payer: Self-pay | Admitting: Internal Medicine

## 2010-11-09 ENCOUNTER — Telehealth: Payer: Self-pay | Admitting: *Deleted

## 2010-11-09 LAB — PROTIME-INR
INR: 2.03 — ABNORMAL HIGH (ref <1.50)
Prothrombin Time: 23.1 seconds — ABNORMAL HIGH (ref 11.6–15.2)

## 2010-11-09 NOTE — Telephone Encounter (Signed)
error 

## 2010-11-09 NOTE — Telephone Encounter (Signed)
Please call the patient's daughter. Father is due for INR, we have been unable to get the results.   [facility]

## 2010-11-10 NOTE — Telephone Encounter (Signed)
Advise care givers @ [facility]:  Same Coumadin, recheck in 4 weeks. Also tell them they need to proactively check the INR, fax it, call the office and get my opinion. I don't think I can continue calling every time he is due. Pass this information to the daughter as well Willow Ora

## 2010-11-10 NOTE — Telephone Encounter (Signed)
I spoke w/ [facility] yesterday when they called and informed them that pt was due and order was faxed for pt to have done yesterday and I gave them fax number to fax results. Results on the ledge for you to review.

## 2010-11-11 ENCOUNTER — Encounter: Payer: Self-pay | Admitting: *Deleted

## 2010-11-11 NOTE — Telephone Encounter (Signed)
Done,

## 2010-11-22 ENCOUNTER — Telehealth: Payer: Self-pay | Admitting: Internal Medicine

## 2010-11-22 NOTE — Telephone Encounter (Signed)
Y[facility] at [facility] needs to get a signed and completed FL-2 for this patient---she says she has sent 6 of these forms to us--I verified that she had the right fax number---Per [name], If she doesn't get this form, the patient will have to be discharged from the facility

## 2010-11-22 NOTE — Telephone Encounter (Signed)
Spoke w/ [name] informed that formed was faxed on 11/08/10 and informed that I refaxed for 2 times to make sure they receive it.

## 2010-11-28 ENCOUNTER — Telehealth: Payer: Self-pay | Admitting: *Deleted

## 2010-11-28 NOTE — Telephone Encounter (Signed)
[facility] Triage Call Report Triage Record Num: [id number] Operator: Ethlyn Gallery Patient Name: Tyler [name] Call Date & Time: 11/26/2010 10:25:40PM Patient Phone: [phone] PCP: Nolon Rod. Paz Patient Gender: Male PCP Fax : Patient DOB: Jan 20, 1934 Practice Name: [facility] Reason for Call: Boneta Lucks, Med Tech, called and stated pt is reporting painacross his chest below his nipple line with SOB. Afebrile. She states he said it is gastric reflux. All Emergent Sxs Not R/O Per Chest Pains Protocol. Jenny/Med Tech advised pt should be sent to [facility]. She states 02 is not available to put on pt. S/T Migdalia Dk, RN Resource Nurse, she said pt should be sent if having chest pains. Advised Jenny/Med Tech to send pt. Protocol(s) Used: Chest Pain Recommended Outcome per Protocol: Activate EMS 911 Reason for Outcome: New onset or worsening shortness of breath or difficulty breathing

## 2010-11-29 NOTE — Telephone Encounter (Signed)
Spoke w/ [facility] and says pt was seen and release and doing better.

## 2010-12-06 NOTE — H&P (Signed)
NAMEABISHAI, Tyler [name]             ACCOUNT NO.:  [id number]   MEDICAL RECORD NO.:  [id number]          PATIENT TYPE:  INP   LOCATION:  0102                         FACILITY:  [facility]   PHYSICIAN:  Hollice Espy, M.D.DATE OF BIRTH:  09-05-1933   DATE OF ADMISSION:  01/13/2007  DATE OF DISCHARGE:                              HISTORY & PHYSICAL   PRIMARY CARE PHYSICIAN:  Willow Ora, M.D.   CHIEF COMPLAINT:  Abdominal distention.   HISTORY OF PRESENT ILLNESS:  The patient is a 77 year old African  American male with past medical history of recurrent acute on chronic  diastolic congestive heart failure, atrial fibrillation, and diabetes  mellitus type 2 who was in the hospital for heart failure complicated  with ileus from April 16 to April 29.  He was then discharged to  [facility] for skilled nursing rehabilitation.  He has  been there ever since and doing relatively well, but then today  Charlynn Court noted that the patient was having a worse distended abdomen,  complaining of abdominal pain, and appeared to be hypotensive.  He was  sent over from [facility].  In the emergency room, his  blood pressure was noted to stable without any incident and when he was  brought in to the emergency room, x-rays were done and he was noted to  have a significantly distended colonic ileus.  With these findings, it  was felt that he needed to obviously commence treatment. An NG tube was  placed.  Currently the patient is somnolent after receiving medication  for pain.  He complains of continued abdominal pain discomfort.  He,  otherwise, is not able to tell me much more on his review of systems  other than he says he is only hurting in his belly.   PAST MEDICAL HISTORY:  1. History of atrial fibrillation.  2. Acute on chronic diastolic congestive heart failure.  3. Diabetes mellitus.   MEDICATIONS:  The patient is on:  1. Lantus 65 units subcu in the morning.  2.  NovoLog sliding scale.  3. Coumadin 7.5 mg p.o. daily except for Tuesday and Thursday when he      takes 10 mg.  4. Advair 100/50 mg one puff b.i.d.  5. Reglan 10 mg p.o. four times a day.  6. UroXatral 10 mg p.o. daily.  7. Spironolactone 25 mg p.o. daily.  8. Lipitor 80 mg p.o. daily.  9. Lasix 40 mg p.o. daily.  10.Glucophage 500 mg p.o. b.i.d.  11.MiraLax 17 g daily.  12.Coreg 6.25 mg p.o. b.i.d.   ALLERGIES:  The patient has no known drug allergies.   SOCIAL HISTORY:  No tobacco, alcohol or drug use.  Currently he is  residing at [facility] skilled nursing.   FAMILY HISTORY:  Noncontributory.   PHYSICAL EXAMINATION:  VITAL SIGNS:  On admission, temperature 97.8,  heart rate 90, blood pressure 121/66, respirations 20, oxygen saturation  100% on 2 L.  GENERAL:  The patient is drowsy but able to awaken and answer some  questions.  HEENT:  Normocephalic, atraumatic.  Mucous membranes are dry.  He has no  carotid bruits.  HEART:  Very soft.  Regular rhythm.  S1 and S2.  LUNGS:  Decreased breath sounds secondary to body habitus.  ABDOMEN:  Firm.  Significantly distended.  Question tenderness.  No  bowel sounds.  EXTREMITIES:  No clubbing or cyanosis.  Trace pitting edema.   LABORATORY DATA:  BNP 48.1.  White count 10.1, hemoglobin 11.1,  hematocrit 33.  MCV 88.  Platelet count 207,000.  82% shift.  Sodium  138, potassium 4, chloride 106, bicarb 24, BUN 44, creatinine 2.86,  glucose 57.  LFTs unremarkable.  PT INR pending.   ASSESSMENT/PLAN:  1. Colonic ileus.  Will place a rectal tube on the patient, make him      NPO, IV Protonix and D5 normal saline.  In review of the      literature, it has been found that chewing sugarless chewing gum      one hour three times a day without swallowing when awake, decreases      ileus time for about 24 hours.  Will attempt to do so while the      patient is awake and alert.  Also will continue on IV Reglan.  2. Diabetes mellitus.   Currently NPO.  Put on sliding scale.  3. History of diastolic congestive heart failure.  Will hydrate      gently.  4. Acute renal failure secondary to dehydration.      Hollice Espy, M.D.  Electronically Signed     SKK/MEDQ  D:  01/14/2007  T:  01/14/2007  Job:  [id number]   cc:   Willow Ora, MD  [phone] [address] [place], [place] [address]

## 2010-12-06 NOTE — Discharge Summary (Signed)
NAMEMYRAN, ARCIA             ACCOUNT NO.:  [id number]   MEDICAL RECORD NO.:  [id number]          PATIENT TYPE:  INP   LOCATION:  1536                         FACILITY:  [facility]   PHYSICIAN:  Rosalyn Gess. Norins, MD  DATE OF BIRTH:  01-Oct-1933   DATE OF ADMISSION:  01/13/2007  DATE OF DISCHARGE:  01/17/2007                               DISCHARGE SUMMARY   ADMISSION DIAGNOSES:  1. Colonic ileus.  2. Diabetes.  3. History of diastolic congestive heart failure.  4. Acute renal insufficiency.   DISCHARGE DIAGNOSES:  1. Colonic ileus resolved secondary probably to gastroparesis.  2. Diabetes, stable.  3. Diastolic congestive heart failure, stable.  4. Renal insufficiency with return to baseline creatinine of 1.3.   CONSULTANTS:  None.   PROCEDURES:  1. Acute abdominal series at admission with marked distention of the      colon compatible with colonic ileus.  Negative for free air.  2. Follow up KUB which shows significant improvement in colonic ileus.   HISTORY OF PRESENT ILLNESS:  The patient is a 77 year old African-  American gentleman with a past medical history of recurrent on chronic  diastolic CHF, atrial fibrillation and diabetes mellitus.  He was  recently hospitalized for heart failure November 07, 2006 to November 20, 2006  and discharged to [facility] for skilled nursing rehabilitation.  On the  day of admission he was noted to have abdominal distention, abdominal  pain and discomfort and was mildly hypotensive. In the emergency  department his blood pressure was stable.  X-ray was performed revealing  significantly distended colonic ileus.  The patient had an NG tube  placed, a rectal tube placed and was admitted for care.  He was started  on Reglan.   MEDICATIONS AT ADMISSION:  1. Lantus 65 units subcu daily.  2. NovoLog sliding scale.  3. Coumadin 7.5 mg Monday, Wednesday and Friday, Saturday, Sunday, 10      mg Tuesday, Thursday.  4. Advair 100/50 one puff h.s.  5. Reglan 10 mg q.i.d.  6. Uroxatral 10 mg daily.  7. Spironolactone 25 mg daily.  8. Lipitor 80 mg daily.  9. Lasix 40 mg daily.  10.Glucophage 500 mg b.i.d.  11.MiraLAX 17 grams daily.  12.Coreg 6.25 mg b.i.d.   Please see the admission note for physical exam at admission.   HOSPITAL COURSE:  1. GI.  The patient with an ileus.  He was put to NG suction as noted.      His symptoms abated.  No change in medications except to make his      Reglan IV.  With the patient's ileus improving he was switched to      first a clear liquid diet and then a full liquid diet which he      tolerated well.  The patient had not had a bowel movement on the      day of discharge, but has been passing gas.  With the patient being      currently stable with no NG tube or rectal tube with no significant      abdominal pain or distention  and good bowel sounds he was thought      to be stable and ready for discharge.  2. Diabetes.  The patient's blood sugars were initially elevated. Did      restart his Lantus and he did have two episodes of hypoglycemia 57      and 59 most likely due to poor p.o. intake while being on full      Lantus dosing.  The plan is for the patient to continue on his      prior regimen.  3. Cardiovascular.  The patient has been stable.   DISCHARGE EXAMINATION:  Temperature is 98.3, blood pressure 171/91,  heart rate 96, respirations 20, O2 saturation is 95% on room air.  I's  and O's were matched at 6.5 liters in, 6.4 liters out.  Final CBG  recorded was 135.  GENERAL APPEARANCE:  This is an obese African-  American gentleman lying in bed in no acute distress.   HEENT:  Normocephalic, atraumatic and unremarkable.   CHEST:  The patient is moving air well.  No increased work of breathing  is noted.  No wheezes or rales are noted.   CARDIOVASCULAR:  2+ radial pulses.  Precordium is quiet.   ABDOMEN:  Massively obese.  Positive bowel sounds in all four quadrants.  No guarding,  no rebound, no tenderness was noted.  No further  examination conducted.   FINAL LABORATORY DATA:  CBC from admission with a hemoglobin of 11.1  grams, white count was normal, platelet count was normal.  Final  chemistries from January 15, 2007 with sodium 142, potassium 4.6, chloride  105, CO2 of 30, BUN of 21, creatinine 1.3, glucose was 210.  Final INR  was 2.1.  Final GI labs were done at admission with normal LFTs.  The  patient had a hemoglobin A1C performed which came back as 7.9%.  Beta  natriuretic peptide at admission was 48.1.   DISCHARGE MEDICATIONS:  The patient will resume all of his home  medications as outlined above.   DISPOSITION:  The patient will return to [facility] to continue his  rehabilitation.   CONDITION AT THE TIME OF DISCHARGE:  Stable and improved.      Rosalyn Gess Norins, MD  Electronically Signed     MEN/MEDQ  D:  01/17/2007  T:  01/17/2007  Job:  [id number]

## 2010-12-06 NOTE — Op Note (Signed)
Tyler [name], Tyler [name]             ACCOUNT NO.:  [id number]   MEDICAL RECORD NO.:  [id number]          PATIENT TYPE:  AMB   LOCATION:  ENDO                         FACILITY:  [facility]   PHYSICIAN:  Petra Kuba, M.D.    DATE OF BIRTH:  28-Nov-1933   DATE OF PROCEDURE:  09/17/2007  DATE OF DISCHARGE:                               OPERATIVE REPORT   PROCEDURE:  EGD.   INDICATIONS FOR PROCEDURE:  Dysphagia, feeding problems, nondiagnostic  barium swallow and modified barium swallow.  Consent was signed after  risks, benefits, methods, and options were thoroughly discussed with  both the patient and his daughter in the past.   MEDICATIONS GIVEN:  Fentanyl 50 mcg, Versed 5 mg.   DESCRIPTION OF PROCEDURE:  The video endoscope was inserted via direct  vision.  The posterior pharynx and upper esophageal sphincter were all  normal.  The scope passed through a normal esophagus.  Maybe he had a  tiny hiatal hernia.  No stricture or ring was seen.  The scope was  slowly withdrawn back to the upper esophageal sphincter again confirming  the normal appearance.  The scope was inserted into the stomach and a  moderate amount of residual food was still seen.  We carefully advanced  to the antrum where it was half filled with food and advanced through a  normal pylorus and into a normal duodenal bulb, right around the C-loop  to a normal second portion of the duodenum.  The scope was withdrawn  back to the stomach.  Some of the food was washed and seemed to break up  nicely.  Some of the water was suctioned.  We did not retroflex for fear  of making him vomit, but no other stomach abnormality was seen.  Air and  water were suctioned.  The scope was slowly withdrawn again.  A good  look at the esophagus and posterior pharynx were normal.  We elected not  to dilate him since nothing was seen and his Coumadin was not held.  I  will have to find out whether that was an oversight by our office or the  [facility] rounding group or the [facility] staff, regardless  we elected not to dilate him.   The patient tolerated the procedure well.  There was no obvious  immediate complications.   ENDOSCOPIC ASSESSMENT:  1. Normal posterior pharynx, upper esophageal sphincter.  2. Tiny hiatal hernia, normal esophagus otherwise without stricture,      ring, or obstruction.  3. Increased food in the stomach that could be broken up by washing.  4. Normal antrum, pylorus, bulb, and second portion of the duodenum.  5. Not dilated due to Coumadin not being held.  Did not retroflex in      the stomach due to increased food in the stomach.   PLAN:  We will try erythromycin.  I thought he had stopped his Reglan  secondary to tremors, but it was still on his list of medicines that  came to his procedure.  We will go ahead and stop that and try  Erythromycin since he  is not allergic to it, 333 30 minutes a.c.  We  will see him back p.r.n. or in six weeks to recheck symptoms and decide  any  other workup and plans, possibly an ENT consult since all of his  problems seem to be chewing and in his mouth and unable to swallow due  to everything staying in his mouth and not so much an esophageal issue.  I am happy to discuss with you.  Call me p.r.n.           ______________________________  Petra Kuba, M.D.     MEM/MEDQ  D:  09/17/2007  T:  09/17/2007  Job:  [id number]   cc:   [facility]  [facility]

## 2010-12-08 ENCOUNTER — Telehealth: Payer: Self-pay | Admitting: Internal Medicine

## 2010-12-08 NOTE — Telephone Encounter (Signed)
Please call care givers and the patient's daughter---->  INR okay, same Coumadin, recheck in 4 weeks

## 2010-12-08 NOTE — Telephone Encounter (Signed)
I spoke w/ caregivers they are aware, faxed orders to them.  Left detailed message for daughter.

## 2010-12-09 NOTE — Discharge Summary (Signed)
NAMEKAEDEN, MESTER             ACCOUNT NO.:  [id number]   MEDICAL RECORD NO.:  [id number]          PATIENT TYPE:  INP   LOCATION:  4742                         FACILITY:  [facility]   PHYSICIAN:  Valerie A. Felicity Coyer, MDDATE OF BIRTH:  04-Oct-1933   DATE OF ADMISSION:  08/12/2006  DATE OF DISCHARGE:  08/17/2006                               DISCHARGE SUMMARY   DISCHARGE DIAGNOSES:  1. Acute chronic obstructive pulmonary disease exacerbation.  2. Diabetes type 2 exacerbated by steroids.  3. Chronic diastolic congestive heart failure.   HISTORY OF PRESENT ILLNESS:  Mr. Caraher is a 77 year old African-  American male who was admitted with chief complaint of shortness of  breath.  He presented with a several-day history of increasing shortness  of breath and had been seen in Urgent Care and told that he had early  pneumonia.  He reportedly continued to wheeze despite routine nebulizer  treatment and outpatient therapy.  The patient was admitted for acute  exacerbation.   PAST MEDICAL HISTORY:  1. Diabetes type 2.  2. Hypertension.  3. Morbid obesity.  4. Sleep apnea, noncompliant with CPAP.  5. History of hyperlipidemia.  6. Sick sinus syndrome with paroxysmal atrial fibrillation.  7. History of COPD versus asthma, quit smoking in 1973.   COURSE OF HOSPITALIZATION:  1. Acute chronic obstructive pulmonary disease exacerbation.  The      patient was admitted and was placed on empiric antibiotics.  He was      also given nebulizer treatments and treated with IV Solu-Medrol.      He required several days treatment with IV Solu-Medrol prior to      transfer to p.o. prednisone.  His lungs are currently clear.  He      has received 5 days of IV antibiotics and will be placed on p.o.      Ceftin at time of discharge  2. Diabetes type 2 exacerbated by steroids.  The patient's Lantus was      started at a dose less than his home dosing initially on admission.      This was titrated up.   His sugars are currently running under 200.      He is instructed to resume his Lantus as prior to admission upon      discharge.  His hemoglobin A1c was noted to be 7.2, and he will      need continued outpatient monitoring.  3. Morbid obesity.  The patient was seen by nutrition during this      visit who discussed diabetes diet principles.   MEDICATIONS AT TIME OF DISCHARGE:  1. Glucophage 500 mg p.o. b.i.d.  2. Reglan 5 mg q.a.c. and h.s.  3. Lopressor 50 mg p.o. b.i.d.  4. MiraLax 17 g p.o. daily.  5. Protonix 40 mg p.o. daily.  6. Benicar 40 mg once daily.  7. Singulair 10 mg p.o. daily.  8. Avodart 0.5 mg p.o. daily.  9. UroXatral 10 mg p.o. daily.  10.Lasix 80 mg p.o. daily in the morning and 40 mg p.o. daily in the      evening.  11.Aldactone  25 mg once daily.  12.Lipitor 80 mg p.o. daily.  13.Xopenex MDI 2 puffs q.6 h p.r.n. wheezing.  14.Prednisone 40 mg p.o. on January 26 and January 27 and then 30 mg      p.o. January 28 and January 29, and then 20 mg p.o. on January 30      and January 31, then 10 mg beyond February 1 and February 2, then      stop.  15.Lantus insulin 65 units injection twice daily as before.  16.Ceftin 500 mg p.o. b.i.d. x5 additional days.  17.Advair 100/50 1 puff twice daily.  18.Coumadin 5 mg p.o. daily until seen in Coumadin Clinic.  19.Mucinex 600 mg p.o. b.i.d. p.r.n.   DISPOSITION:  The patient will be discharged to home.  We will request  home health PT and OT for assistance at home.   FOLLOWUP:  1. The patient is to follow up in the Coumadin Clinic on Monday,      January 28 at 8 a.m.  His Coumadin dose is lowered during this      admission per pharmacy protocol.  He will be sent home on 5 mg p.o.      daily with close outpatient follow-up.  His PT/INR on the day of      discharge is 2.4.  2. The patient is to follow up with Dr. Willow Ora on Wednesday, January      30, at 1 p.m.   The patient is instructed to call Dr. Drue Novel should he  develop blood sugars  over 300 or less than 80, shortness of breath, or fever over 101.      Sandford Craze, NP      Raenette Rover. Felicity Coyer, MD  Electronically Signed    MO/MEDQ  D:  08/17/2006  T:  08/17/2006  Job:  [id number]   cc:   Willow Ora, MD

## 2010-12-09 NOTE — Consult Note (Signed)
Tyler [name], Tyler [name]             ACCOUNT NO.:  [id number]   MEDICAL RECORD NO.:  [id number]          PATIENT TYPE:  INP   LOCATION:  4736                         FACILITY:  [facility]   PHYSICIAN:  Learta Codding, MD,FACC DATE OF BIRTH:  August 24, 1933   DATE OF CONSULTATION:  11/07/2006  DATE OF DISCHARGE:                                 CONSULTATION   STAT CARDIOLOGY CONSULTATION   REASON FOR CONSULTATION:  Evaluation of dyspnea and increased edema in a  77 year old male with known diastolic dysfunction.   HISTORY OF PRESENT ILLNESS:  The patient is an 77 year old, African-  American male with a history of paroxysmal atrial fibrillation followed  by Dr. Gala Romney.  The patient has normal LV systolic function, also had  a catheterization in 2003, which showed no significant coronary artery  disease.  The patient has now been admitted by Dr. Drue Novel after he  presented with increased dyspnea and increased abdominal girth.  The  patient also reported increased edema.  He also felt that he had  increased facial swelling.  His weight is up quite a bit, although this  is not clearly documented.  He denies any substernal chest pain upon  exertion.  On EKG in Dr. Leta Jungling office, the patient appears to be in an  ectopic atrial rhythm.  I could not discern clear P-waves.  Heart rate,  however, was controlled at 92 beats per minute.  The patient reportedly  is somewhat noncompliant, and he has not instituted any weight-losing  measures.  In addition, the patient is also not particularly physically  active.  Of note is that the patient denies, however, any orthopnea or  PND but does report increased dyspnea on exertion.   ALLERGIES:  No known drug allergies.   MEDICATIONS:  As documented from the recent note from Dr. Gala Romney:  1. Metformin 1000 mg p.o. b.i.d.  2. Avodart 0.5 daily.  3. Metoprolol 50 b.i.d.  4. Singulair 10 mg p.o. daily.  5. Lipitor 80 mg p.o. daily.  6. Warfarin as directed.  7.  Lasix 80 mg in the morning, 40 in the evening.  8. UroXatral.  9. Benicar 40 a day.  10.Lantus insulin.  11.MiraLax.  12.Reglan 5 mg t.i.d.  13.Protonix 40 mg p.o. daily.   SOCIAL HISTORY:  Reviewed.  Patient does not smoke or drink.   FAMILY HISTORY:  Noncontributory.   REVIEW OF SYSTEMS:  As per HPI.  No nausea or vomiting, no fever or  chills, facial swelling, positive for edema, no orthopnea or PND, no  palpitations or syncope.  The patient has difficulty walking.  He states  that he has weakness in the left leg.  The remainder of the review of  systems is noncontributory.   PHYSICAL EXAMINATION:  VITAL SIGNS:  Blood pressure 140/85, heart rate  is 95 beats per minute, temperature is afebrile, saturation is 97% on  room air.  GENERAL:  An obese, African-American male in no apparent distress.  HEENT:  Pupils:  Eyes are clear, conjunctivae are clear.  NECK:  Supple, normal carotid upstroke, no carotid bruits, JVD is  approximately 8  to 9 cm.  LUNGS:  A few crackles at the bases.  HEART:  Regular rate and rhythm with a normal S1 and S2.  ABDOMEN:  A distended abdomen, which is rather tense, but there is no  rebound or guarding.  EXTREMITIES:  No cyanosis or clubbing.  There is 3+ peripheral pitting  edema.   LABORATORY WORK:  Currently pending.   EKG is reviewed as above.   A chest x-ray is pending.   PROBLEM LIST:  1. Diastolic heart failure.  2. Hypertensive heart disease.  3. Volume overload.  4. Atypical chest pain with normal coronary arteries by      catheterization in 2003.  5. Sick sinus syndrome with a history of paroxysmal atrial      fibrillation.      a.     Status post cardioversion.      b.     Normal left ventricular function with no obvious valvular       disease.      c.     Catheterization, 2003, normal coronary arteries and normal       left ventricular function.      d.     History of bradycardia and high grade atrioventricular nodal        block.      e.     Bifascicular block, chronic.  6. Morbid obesity.  7. Obesity hyperventilation syndrome.  8. Obstructive sleep apnea and noncompliance with continuous positive      airway pressure.  9. Diabetes mellitus.  10.History of tobacco use with chronic obstructive pulmonary disease      and quit in 1973.   PLAN:  1. The patient appears to be volume overloaded.  He can be treated      with intravenous Lasix.  I have recommended 40 mg of Lasix IV      t.i.d.; BMET will need to be followed.  2. Repeat echocardiogram can be ordered as an outpatient.  The patient      does appear to have diastolic dysfunction and predominantly right      heart failure symptoms.  3. Further followup to rule out pulmonary hypertension appears      indicated.  4. The patient is not hypoxic on room air currently and does not      require oxygen at rest.  The patient needs to      adhere to compliance with his CPAP mask.  5. At this point, there are no further recommendations from our      perspective, and the patient can be diuresed, following this he can      be discharged, and followup can be provided by Dr. Gala Romney in the      office.      Learta Codding, MD,FACC  Electronically Signed     GED/MEDQ  D:  11/07/2006  T:  11/07/2006  Job:  [id number]

## 2010-12-09 NOTE — Discharge Summary (Signed)
NAMEBLANDON, OFFERDAHL             ACCOUNT NO.:  [id number]   MEDICAL RECORD NO.:  [id number]          PATIENT TYPE:  INP   LOCATION:  4736                         FACILITY:  [facility]   PHYSICIAN:  Valerie A. Felicity Coyer, MDDATE OF BIRTH:  02-Jan-1934   DATE OF ADMISSION:  11/07/2006  DATE OF DISCHARGE:  11/20/2006                               DISCHARGE SUMMARY   DISCHARGE DIAGNOSES:  1. Acute on chronic ileus.  2. Diastolic congestive heart failure.  3. Diabetes type 2.  4. Chronic atrial fibrillation.   HISTORY OF PRESENT ILLNESS:  Mr. Tyler [name] is a 77 year old male who was  admitted on November 07, 2006, with chief complaint of swelling.  He was  brought to the office after a 2-3 week history of increasing abdominal  size as well as increasing swelling of his hands and feet.  He was also  noted to have difficulty breathing and dyspnea on exertion which was  worse than baseline.  The patient was admitted for further evaluation  and treatment.   PAST MEDICAL HISTORY:  1. Diabetes.  2. Hypertension.  3. Dyslipidemia.  4. Asthma.  5. Chronic constipation.  6. GERD.  7. History of BPH status post laser procedure which rendered him      incontinent.  8. Chronic increased alkaline phosphatase.  9. History of sick sinus syndrome with paroxysmal atrial fibrillation      status post direct cardioversion.  10.Status post normal cardiac cath 2003 with normal LV function.  11.History of severe bradycardia on high dose AV nodal blockers.  12.Chronic fascicular block.  13.Morbid obesity.  14.Sleep apnea with noncompliance with CPAP.  15.Gastroparesis diagnosed 2006 through gastric scan.   COURSE OF HOSPITALIZATION PROBLEM:  1. Acute on chronic diastolic heart failure.  The patient was admitted      and was diuresed with IV Lasix.  A Cardiology consult was obtained      and the patient was seen by Dr. Nicholes Mango.  The patient is      currently euvolemic and stable on current oral dosing  of Lasix,      will continue same.  2. Chronic constipation with ileus.  The patient was seen in      consultation by Dr. Claudette Head of [facility].  He was noted on      KUB to have an ileus.  His Reglan was increased from 5 to 10 mg      p.o. q.i.d. and was also changed to IV during this admission.  He      required multiple enemas as well as the addition of magnesium      citrate and MiraLax during this admission.  He is currently moving      his bowels.  He is tolerating a regular diet without difficulty.  3. Diabetes type 2.  The patient reports having been on Lantus 65      units twice daily at home.  While hospitalized, his blood sugars      have remained stable and his current inpatient dosing is 90 units      subcu q.h.s.  He is  also maintained on sliding scale coverage.      This will be continued at time of discharge to the skilled nursing      facility.  4. Debilitation.  The patient was seen by Physical Therapy.  They      recommended home health vs. skilled placement.  Initially the      patient and family were refusing placement.  The daughter works at      [employer] and is currently requesting      transfer to [place] at the time of discharge.   The patient is scheduled for a followup visit with [facility] on May  12th at 1:45.  Can consider possible referral to [facility] to Dr.  Dorita Fray, the motility specialist, per GI recommendations.   PHYSICAL EXAM:  BP 119/66, heart rate 90, respiratory rate 20, temp  98.6, O2 sat 100% on room air.  GENERAL:  The patient is an awake, alert, elderly African-American  gentleman with morbid obesity who is noted to have somewhat flat affect  but is in no acute distress.  CARDIOVASCULAR:  S1, S2, regular rate and rhythm.  LUNGS:  Clear to auscultation bilaterally with no wheezes, rales or  rhonchi.  ABDOMEN:  Obese.  He is noted to have mild distention which is  nontender.  He has positive bowel  sounds.  EXTREMITIES:  Trace lower extremity edema.  NEURO:  Patient answers questions appropriately.  He is moving all  extremities.  He is able to ambulate with a rolling walker.   MEDICATIONS AT TIME OF DISCHARGE:  1. Protonix 40 mg p.o. b.i.d.  2. Lipitor 80 mg p.o. daily.  3. Spironolactone 25 mg p.o. daily.  4. Coumadin to be held November 20, 2006, for INR of 4.3.  Previous      Coumadin dosing was Coumadin 5 mg p.o. daily except for 10 mg on      Fridays.  5. Advair 100/50 one puff twice daily.  6. UroXatral 10 mg p.o. daily.  7. Diltiazem CD 180 mg p.o. daily.  8. Lisinopril 5 mg p.o. daily.  9. Reglan 10 mg p.o. q.a.c. and h.s.  10.Coreg 6.25 mg p.o. b.i.d.  11.Glucophage 500 mg p.o. b.i.d.  12.Zyrtec 10 mg p.o. daily as needed.  13.Lasix 80 mg p.o. q.a.m., 40 mg p.o. q.p.m.  14.Lantus insulin 9 units subcu daily.  15.MiraLax 17 g p.o. b.i.d.  16.NovoLog sliding scale coverage q.a.c. and h.s. per protocol.  17.Magnesium citrate one bottle every other day as needed.   PERTINENT LABORATORIES AT TIME OF DISCHARGE:  Hemoglobin 10.6,  hematocrit 31.4, INR 4.3, BUN 21, creatinine 1.39.   DISPOSITION:  Plan for the patient to transfer to skilled nursing  facility at family's request.   FOLLOWUP:  The patient is to follow up with Dr. Christella Hartigan on May 12th at  1:45 p.m.      Sandford Craze, NP      Raenette Rover. Felicity Coyer, MD  Electronically Signed    MO/MEDQ  D:  11/20/2006  T:  11/20/2006  Job:  [id number]   cc:   Willow Ora, MD  Rachael Fee, MD

## 2010-12-09 NOTE — H&P (Signed)
NAMEYOSGAR, DEMIRJIAN NO.:  [id number]   MEDICAL RECORD NO.:  [id number]          PATIENT TYPE:  INP   LOCATION:  4736                         FACILITY:  [facility]   PHYSICIAN:  Willow Ora, MD           DATE OF BIRTH:  22-Dec-1933   DATE OF ADMISSION:  11/07/2006  DATE OF DISCHARGE:  08/18/2006                              HISTORY & PHYSICAL   CHIEF COMPLAINT:  Swelling.   HISTORY OF PRESENT ILLNESS:  Mr. Tyler [name] is a 77 year old gentleman who  was brought to the office by one of the caregivers at the assisted  living facility where he resides.  For the last two or three weeks, they  have noticed an increase in the abdominal size as well as swelling on  his hands and feet.  His difficulty breathing and dyspnea on exertion is  worse than baseline.  The family also noticed some facial swelling.  He  also complained of decreased energy.  He has chronic back pain, and this  has been slightly worse lately.  At the office evaluation, we noticed  him to be volume overloaded and admitted to the hospital for further  care.   PAST MEDICAL HISTORY:  1. Diabetes.  2. Hypertension.  3. High cholesterol.  4. Asthma.  5. Chronic constipation.  6. GERD.  7. History of BPH status post laser procedure which rendered him      incontinent.  8. Chronic increased alkaline phosphate.  9. Cardiac history is extensive.  His primary cardiologist is Dr.      Gala Romney.  His cardiac history includes sick sinus syndrome with      history of paroxysmal atrial fibrillation status post direct      cardioversion, normal cardiac catheterization in 2003 with normal      left ventricular function.  He also has a history of severe      bradycardia on high dose AV-nodal blockers.  Chronic bifascicular      block.  10.Morbid obesity.  11.Sleep apnea with noncompliance with CPAP.  12.Gastroparesis diagnosed in 2006 through gastric scan.   FAMILY HISTORY:  Noncontributory.   SOCIAL HISTORY:  He  is a former smoker and does not drink.   REVIEW OF SYSTEMS:  Denies any fever.  Appetite is fair.  No orthopnea  or chest pain, no nausea, diarrhea or abdominal pain.  Bowel movements  are regular without any blood.  He states that he is taking all of his  medications as prescribed and denies the recent use of any NSAIDS.  No  new medications have been added to his regimen recently.   MEDICATIONS:  1. Metformin 500 one p.o. b.i.d.  2. Reglan 5 q.a.c. and h.s.  3. Lopressor 50 one p.o. b.i.d.  4. MiraLax 17 grams daily.  5. Protonix 40 one p.o. daily.  6. Benicar 40 one p.o. daily.  7. Singulair 10 one p.o. daily.  8. Avodart 0.5 one p.o. daily.  9. Uroxatral 10 one p.o. daily.  10.Lasix 80 mg in the morning and 40 mg in the afternoon by mouth.  11.Aldactone  25 one p.o. daily.  12.Lipitor 80 one p.o. daily.  13.Lantus 65 daily.  14.Advair 100/50 twice a day.  15.Coumadin; current dose is 5-mg pills, 1 pill a day except Friday      when he takes 2.  16.Mucinex.  17.Xopenex as needed.   ALLERGIES:  No known drug allergies.   PHYSICAL EXAMINATION:  On today's exam, we noticed that his weight has  been going up steadily.  Back 6 weeks ago, he weighed 284 pounds; today  is 293.  He is afebrile, pulse 96, respirations 22, blood pressure  140/82.  He looks slightly weaker and more short of breath than usual.  NECK:  No mass.  LUNGS:  Severely decreased breath sounds at bases.  Mild wheezing and  respiratory distress with ambulation.  CARDIOVASCULAR:  Seems to be regular rate and rhythm today.  He does  have JVD of around 8 cm when he laid down 45 degrees.  ABDOMEN:  The abdomen is markedly increased in size compared to previous  office visits.  He is nontender.  He has good bowel sounds.  It is hard  to say if he has shifting dullness.  He does have some pitting edema at  the abdominal wall.  Good bowel sounds.  NEUROLOGICAL EXAM:  He is basically at his baseline.   LABORATORY  AND X-RAYS:  Hemoglobin at the office was 10.7.  EKG compared  to the previous one showed no acute changes.   ASSESSMENT AND PLAN:  1. The patient is admitted to the hospital with volume overload by      history and physical.  This is evidenced by his increased weight,      increased difficulty breathing, decreased breath sounds that I      suspect it is from pleural effusions.  His JVD, however, is not      that impressive.  At this point, he will be admitted to telemetry.      Will cycle cardiac enzymes and call cardiology for assistance in      his management.  2. Diabetes.  Will continue with his Glucophage.  He usually take 65      units of Lantus at home but that this time will put him on 40 units      at night plus sliding scale.  3. Hypertension.  Blood pressure today is acceptable.  It has been      acceptable during the outpatient evaluations  4. High cholesterol.  Continue with Lipitor and check LFTs.  5. Continue with his Coumadin therapy.      Willow Ora, MD  Electronically Signed    JP/MEDQ  D:  11/07/2006  T:  11/08/2006  Job:  [id number]

## 2010-12-09 NOTE — Assessment & Plan Note (Signed)
[facility]                            CARDIOLOGY OFFICE NOTE   HILMAN, KISSLING                      MRN:          [id number]  DATE:08/03/2006                            DOB:          May 27, 1934    PRIMARY CARE PHYSICIAN:  Dr. Willow Ora.   PATIENT IDENTIFICATION:  Mr. Tyler [name] is a 77 year old male with a  history of paroxysmal atrial fibrillation and sick sinus syndrome, as  well as chest pain with normal coronaries by catheterization in 2003.  History also notable for morbid obesity, diabetes, and sleep apnea,  which he is not compliant with his CPAP.   Mr. Tyler [name] returns today for a routine followup.  About 3 weeks ago,  he had a few episodes of pinching chest pain which was transient, not  associated with exertion and any other symptoms.  He also has noted some  abdominal pain around his navel which comes and goes, describes this as  a burning pain.  He has not had any bright red blood per rectum, no  melena.  Has had some mild lower extremity edema, which resolves on his  Lasix.  No syncope or presyncope.   CURRENT MEDICATIONS:  1. Metformin 1000 b.i.d.  2. Avodart 0.5 a day.  3. Metoprolol 50 b.i.d.  4. Singulair 10 a day.  5. Lipitor 80 a day.  6. Warfarin.  7. Lasix 80 in the morning, 40 at night.  8. UroXatral.  9. Benicar 40 a day.  10.Lantus insulin.  11.MiraLax.  12.Reglan 5 t.i.d.  13.Protonix 40 a day.   PHYSICAL EXAMINATION:  He is a morbidly obese male, ambulates around the  clinic slowly, but no respiratory difficulty.  Blood pressure is 118/64,  heart rate is 87, weight is 287, which is up 20 pounds since last year.  HEENT:  Sclerae anicteric.  EOMI.  There are no xanthelasmas.  Mucous  membranes are moist.  NECK:  Supple.  Unable to assess his JVP accurately, but appears flat.  Carotids are 2+ bilaterally without any bruits.  There is no  lymphadenopathy or thyromegaly.  CARDIAC:  Regular rate and rhythm with  an S4, no murmur.  LUNGS:  Clear.  ABDOMEN:  Morbid central obesity.  Soft, nontender.  I am unable to  appreciate any hepatosplenomegaly, no bruits or masses appreciated.  EXTREMITIES:  Warm with no cyanosis, clubbing, or edema.  There are  chronic venostasis changes.  NEURO:  Alert and oriented x3.  Cranial nerves II-XII grossly intact.  Moves all 4 extremities without difficulty.  Affect is appropriate.   EKG shows normal sinus rhythm with a bifascicular block, including a  right bundle branch block and a left anterior fascicular block, rate of  67.   ASSESSMENT/PLAN:  1. Chest pain.  This is very atypical.  He has had a normal      catheterization and a recent test.  I do not think we need to      pursue it further at this point.  2. Hypertension, well-controlled.  3. Hyperlipidemia, followed by Dr. Drue Novel.  4. Morbid obesity.  He continues to  gain weight, and I have once again      reminded him of the absolute need to try and lose weight through      diet and being more active.  5. Atrial fibrillation with sick sinus syndrome and bifascicular      block.  He is currently doing well.  He is maintained on Coumadin.      I suspect at some point he may need a pacemaker, but certainly no      indications currently.  We will follow him on a yearly basis.  I      told him to call me if he needs anything sooner.     Bevelyn Buckles. Bensimhon, MD  Electronically Signed    DRB/MedQ  DD: 08/03/2006  DT: 08/04/2006  Job #: [id number]

## 2010-12-09 NOTE — Assessment & Plan Note (Signed)
[facility]                              CARDIOLOGY OFFICE NOTE   Tyler [name], Tyler [name]                      MRN:          [id number]  DATE:05/14/2006                            DOB:          02-02-1934    PRIMARY CARE PHYSICIAN:  Dr. Willow Ora.   PATIENT IDENTIFICATION:  Tyler [name] is a 77 year old man, who presents  today with his daughter for routine followup.   PROBLEM LIST:  1. Sick sinus syndrome with history of paroxysmal atrial fibrillation,      status post direct current cardioversion.      a.     Echocardiogram July 2006, normal left ventricular function.  No       obvious valvular disease.      b.     Cardiac catheterization 2003, normal left ventricular function.       No coronary artery disease.      c.     History of severe bradycardia on high-dose A-V nodal blockers.      d.     A 48-hour Holter July 2006 predominately sinus rhythm with very       brief runs of supraventricular tachycardia, minimal heart rate 49,       average 63.      e.     Bifascicular block.  2. Hypertension  3. Hyperlipidemia.  4. Morbid obesity.  5. Obesity hypoventilation syndrome.  6. Obstructive sleep apnea and non-compliant with continuous positive      airway pressure.  7. Diabetes.  8. History of tobacco use with chronic obstructive pulmonary disease and      quit in 1973.   MEDICATIONS:  1. Metformin 1000 a day.  2. Avodart 0.5.  3. Metoprolol 50 b.i.d.  4. Singulair 10 a day.  5. Lipitor 80 a day.  6. Warfarin as directed.  7. Visicol.  8. Lasix 80 a day.  9. Uroxatral 10 a day.  10.Benicar 20 a day.  11.Lantus as directed.  12.MiraLax.  13.Reglan.  14.Protonix 40 a day.   No known drug allergies.   INTERVAL HISTORY:  Tyler [name] comes today for routine followup here with  his daughter.  Tells me that since we last saw him about 6 months ago, he  has had 2 episodes of chest tingling.  This was nonexertional and happened  while  he was watching TV.  No associated symptoms.  He has been going to  physical therapy about twice a week using the bike and doing some exercises  without any chest pain.  Unfortunately, he has been watching 6 hours of TV a  day and not watching his diet and has gained almost 25 pounds since we last  saw him.  He has not had trouble with congestive heart failure.   PHYSICAL EXAMINATION:  GENERAL:  Obese male in no acute distress.  Somewhat  lethargic with a flattened affect.  Respirations are unlabored.  VITAL SIGNS:  Blood pressure 120/62.  Heart rate 68.  Weight 292.  HEENT:  Sclera anicteric.  EOMI.  There is no xanthelasma.  Mucous membranes  are moist.  NECK:  Supple.  No obvious JVD.  Carotids are 2+ bilaterally without bruits.  There is no lymphadenopathy or thyromegaly.  CARDIAC:  Regular rate and rhythm.  No murmurs, rubs or gallops.  LUNGS:  Clear.  ABDOMEN:  Obese.  Nontender.  I am unable to palpate for hepatosplenomegaly.  There are no bruits or masses.  EXTREMITIES:  Warm with no cyanosis or clubbing.  There is chronic venous  stasis changes, just trace edema.  NEURO:  Alert and oriented x3.  Cranial nerves II-XII grossly intact.  Moves  all 4 extremities without difficulty.  Affect is flattened.  There are no  rashes.  No arthropathies.   EKG normal sinus rhythm with rate of 68 with bifascicular block with right  bundle branch and left anterior fascicular block.  No acute ST-T wave  changes.   ASSESSMENT AND PLAN:  1. Chest pain.  I think this is quite atypical however, given his risk      factors, I do think it is reasonable to pursue a nuclear study.  Will      also check echocardiogram to make sure his pulmonary pressures are not      elevated given his sleep apnea and obesity.  2. Morbid obesity.  I spent a long time talking with him and his daughter      about the need to get more exercise and start a walking program as he      is quite deconditioned and I fear he  will become debilitated.  They      have agreed to look into cardiac rehab.  We also talked extensively      about diet.  3. Hypertension, well-controlled.  4. Hyperlipidemia.  This is followed by Dr. Drue Novel.  Given his diabetes,      would recommend titrating his statin to get his LDL below 70.  5. Obstructive sleep apnea.  I reminded him of the need to be compliant      with continuous positive airway pressure.   DISPOSITION:  Will see him back in the clinic in 3-4 months for routine  followup.  I also ordered BNP and thyroid panel today.     Bevelyn Buckles. Bensimhon, MD    DRB/MedQ  DD: 05/14/2006  DT: 05/15/2006  Job #: [id number]   cc:   Willow Ora, MD

## 2010-12-09 NOTE — Assessment & Plan Note (Signed)
[facility]                           GASTROENTEROLOGY OFFICE NOTE   MIGUELANGEL, KORN                      MRN:          [id number]  DATE:02/06/2006                            DOB:          02/26/1934    PRIMARY CARE PHYSICIAN:  Willow Ora, M.D.   GASTROINTESTINAL PROBLEM LIST:  Chronic constipation likely functional,  status post colonoscopy, January 2005, at [facility] that was normal, has  responded very well to MiraLax.   NEW GASTROINTESTINAL PROBLEM LIST:  1.  Gastroparesis, likely diabetic related documented by solid phase gastric      emptying scan, September 2006, with 43% of trace remaining at 120      minutes, normal is less than 30%.  2.  Nausea is mild while taking Reglan 5 mg t.i.d.  3.  No signs of severe disease such as constant nausea, vomiting, or weight      loss.   INTERVAL HISTORY:  I last saw Mr. Sprigg 3 months ago.  Since then he has  continued taking MiraLax 2-3 scoops a day and with this regimen his bowels  are moving very regularly.  He has 1-2, sometimes 3 soft easy to move bowel  movements daily.  His nausea is not a problem while taking Reglan 2-3 times  a day.  He recently saw his primary care physician for a routine checkup and  was found to have anemia with a hemoglobin of 10.9.  He believes that is why  he was sent to see me today.   He has had no signs of overt GI bleeding such as melena or bright red blood  per rectum, although his INR was 4.8 at the time of his low hemoglobin.  Creatinine was 1.5.   CURRENT MEDICINES:  Metformin, Avodart, metoprolol, Singulair, Lipitor,  Warfarin, Visicol, Lasix, UroXatral, Benicar, Lantus, MiraLax, Reglan,  Protonix, CPAP machine.   PHYSICAL EXAMINATION:  VITAL SIGNS:  Weight is 267 pounds, 8 pounds down  from his last visit.  Blood pressure 124/60, pulse 80.  CONSTITUTIONAL:  Morbidly obese.  LUNGS:  Clear to auscultation bilaterally.  HEART:  Regular rate and  rhythm.  ABDOMEN:  Soft, nontender, nondistended.  Normal bowel sounds.   ASSESSMENT:  A 77 year old man with:  1.  Morbid obesity.  2.  Diabetes.  3.  Gastroparesis.  4.  Chronic constipation.   PLAN:  1.  He will continue taking MiraLax on a daily basis as well as Reglan on a      2-3 times daily basis for the above symptoms.  2.  It is not clear why he has mild anemia.  It is likely multifactorial.      He has had a recent colonoscopy and that was normal.  I will arrange for      him to have iron studies checked today and if he is iron deficient, we      will proceed with upper GI endoscopy at his soonest convenience.  3.  I see no reason for any further blood tests or imaging studies prior to      then.  Rachael Fee, MD   DPJ/MedQ  DD:  02/06/2006  DT:  02/06/2006  Job #:  [id number]   cc:   Willow Ora, MD

## 2010-12-15 ENCOUNTER — Encounter: Payer: Self-pay | Admitting: Internal Medicine

## 2010-12-22 ENCOUNTER — Telehealth: Payer: Self-pay | Admitting: Internal Medicine

## 2010-12-22 NOTE — Telephone Encounter (Signed)
Received a fax stating that pt was having reflux and a serious sinus problem, per Dr.Paz he needs an OV or UC today if needed today. Tried to to call [name], was unable to speak with someone. Foy at the front desk asked me to fax over the recommendations and he would be sure that the Med Tech got them.

## 2010-12-22 NOTE — Telephone Encounter (Signed)
Received a fax, patient having GERD symptoms and a "serious sinus problem". Recommend office visit, or go to urgent care today if needed.

## 2010-12-23 ENCOUNTER — Ambulatory Visit (INDEPENDENT_AMBULATORY_CARE_PROVIDER_SITE_OTHER): Payer: Medicare Other | Admitting: Family Medicine

## 2010-12-23 DIAGNOSIS — J209 Acute bronchitis, unspecified: Secondary | ICD-10-CM

## 2010-12-23 DIAGNOSIS — Z7901 Long term (current) use of anticoagulants: Secondary | ICD-10-CM

## 2010-12-23 DIAGNOSIS — K219 Gastro-esophageal reflux disease without esophagitis: Secondary | ICD-10-CM

## 2010-12-23 MED ORDER — CLARITHROMYCIN ER 500 MG PO TB24: 1000.0000 mg | ORAL_TABLET | Freq: Every day | ORAL | Status: AC

## 2010-12-23 NOTE — Patient Instructions (Signed)
This appears to be a sinus infection Take the Biaxin as directed- take w/ food to avoid upset stomach Make sure you tell the Coumadin people that you are on antibiotics- this may change your level Use Mucinex to thin your congestion Drink plenty of fluids Robitussin or Delsym as needed for cough Call with any questions or concerns Hang in there!

## 2010-12-23 NOTE — Progress Notes (Signed)
  Subjective:    Patient ID: Tyler [name], male    DOB: 08/22/1932, 77 y.o.   MRN: [id number]  HPI URI- pt complaining of chest and nasal congestion, sore throat, bilateral ear fullness, runny nose, no fever.  + cough- productive of clear sputum.  Uncertain of sick contacts but lives in assisted care.  sxs started 2-3 weeks ago.  GERD- pt reports he has been complaining about this 'since i got here but no one is listening'.  Pt very frustrated and wants to know what can be done.  Per med list is on PPI but pt isn't sure what he's taking and no one accompanied him to visit today.  Reports intermittent difficulty w/ swallowing.   Review of Systems For ROS see HPI     Objective:   Physical Exam  Constitutional: He appears well-developed and well-nourished. No distress.  HENT:  Head: Normocephalic and atraumatic.  Right Ear: Tympanic membrane normal.  Left Ear: Tympanic membrane normal.  Nose: No mucosal edema or rhinorrhea. Right sinus exhibits no maxillary sinus tenderness and no frontal sinus tenderness. Left sinus exhibits no maxillary sinus tenderness and no frontal sinus tenderness.  Mouth/Throat: Mucous membranes are normal. No oropharyngeal exudate, posterior oropharyngeal edema or posterior oropharyngeal erythema.  Eyes: Conjunctivae and EOM are normal. Pupils are equal, round, and reactive to light.  Neck: Normal range of motion. Neck supple.  Cardiovascular: Normal rate, regular rhythm and normal heart sounds.   Pulmonary/Chest: Effort normal and breath sounds normal. No respiratory distress. He has no wheezes.       + hacking cough  Lymphadenopathy:    He has no cervical adenopathy.  Skin: Skin is warm and dry.          Assessment & Plan:

## 2010-12-30 ENCOUNTER — Telehealth: Payer: Self-pay | Admitting: Internal Medicine

## 2010-12-30 NOTE — Telephone Encounter (Signed)
Give 10 u now, then continue with SS and CBG checks as usual

## 2010-12-30 NOTE — Telephone Encounter (Signed)
Informed provided over phone and faxed to [facility].

## 2010-12-30 NOTE — Telephone Encounter (Signed)
Please advise 

## 2011-01-03 NOTE — Assessment & Plan Note (Signed)
Asked pt to clarify w/ assisted living staff whether he was truly taking his PPI.  If he isn't, he needs to start.  If he is, the next step would be GI referral.  Will defer that to PCP.

## 2011-01-03 NOTE — Assessment & Plan Note (Signed)
Given duration of cough and likely sick contacts in assisted living facility will start abx.  Reviewed supportive care and red flags that should prompt return. Pt expressed understanding and is in agreement w/ plan.

## 2011-01-05 ENCOUNTER — Telehealth: Payer: Self-pay | Admitting: Internal Medicine

## 2011-01-05 DIAGNOSIS — K219 Gastro-esophageal reflux disease without esophagitis: Secondary | ICD-10-CM

## 2011-01-05 NOTE — Telephone Encounter (Signed)
Tyler [name] from [employer] patient not feeling any better - patient was seen [id number] - still coughing, throat raspy - acid reflus not better wants a different med -  [facility]

## 2011-01-05 NOTE — Telephone Encounter (Signed)
Dr. Drue Novel pls advise was seen on 6/1 and comments stated may need GI referral.

## 2011-01-06 ENCOUNTER — Telehealth: Payer: Self-pay | Admitting: Internal Medicine

## 2011-01-06 NOTE — Telephone Encounter (Signed)
Arrange a GI referal

## 2011-01-06 NOTE — Telephone Encounter (Signed)
Called facility and faxed order to [facility].

## 2011-01-06 NOTE — Telephone Encounter (Signed)
Addended by: Leanne Lovely on: 01/06/2011 08:31 AM   Modules accepted: Orders

## 2011-01-06 NOTE — Telephone Encounter (Signed)
INR therapeutic, and no change, 4 weeks. Let the facility know

## 2011-01-10 ENCOUNTER — Ambulatory Visit (INDEPENDENT_AMBULATORY_CARE_PROVIDER_SITE_OTHER): Payer: Medicare Other | Admitting: Internal Medicine

## 2011-01-10 ENCOUNTER — Encounter: Payer: Self-pay | Admitting: Internal Medicine

## 2011-01-10 VITALS — BP 132/80 | HR 83 | Temp 97.9°F | Wt 256.8 lb

## 2011-01-10 DIAGNOSIS — K219 Gastro-esophageal reflux disease without esophagitis: Secondary | ICD-10-CM

## 2011-01-10 DIAGNOSIS — J309 Allergic rhinitis, unspecified: Secondary | ICD-10-CM

## 2011-01-10 MED ORDER — FEXOFENADINE HCL 60 MG PO TABS: 60.0000 mg | ORAL_TABLET | Freq: Every day | ORAL | Status: DC

## 2011-01-10 MED ORDER — MOMETASONE FUROATE 50 MCG/ACT NA SUSP: 2.0000 | Freq: Every day | NASAL | Status: DC

## 2011-01-10 MED ORDER — FAMOTIDINE 40 MG PO TABS: 40.0000 mg | ORAL_TABLET | Freq: Every day | ORAL | Status: DC

## 2011-01-10 NOTE — Progress Notes (Signed)
  Subjective:    Patient ID: Tyler [name], male    DOB: 18-Jan-1934, 77 y.o.   MRN: [id number]  HPI Here with her daughter Continue with stuffy head, runny nose, throat congestion and cough. He had antibiotics, mild help? Also continue with GERD symptoms, described as "some heartburn"  and acid in the stomach.  Past Medical History  Diagnosis Date  . Diabetes mellitus 1980  . Hyperlipidemia 1980  . Hypertension 1980  . Asthma   . Allergic rhinitis   . GERD (gastroesophageal reflux disease)   . BPH (benign prostatic hypertrophy)     s/p thermo therapy, has bladdero utlet obst. and instablitiy, incontient  . Anemia   . Chronic constipation   . Sick sinus syndrome   . Paroxysmal atrial fibrillation     status post direct cardioversion- normal cardiac cath 2003, with normal left ventricular function (done after an abnormal cardiolite)  . Bradycardia     severe, on high doese AV-nodal blockers  . Bifascicular block     chronic  . Obesity   . Sleep apnea     noncomplicance w/ CPAP  . Hypoxia     in the past, likely multifactorial  . Gastroparesis     dx in 2006 through gastric scan  . Gynecomastia     nomral mammograms 11/2008   Past Surgical History  Procedure Date  . Cataract extraction   . Rotator cuff repair     right  . Cardiac catheterization 2003    normal, left ventricular function (done after an abnormal cardiolite)     Review of Systems Denies fevers No shortness of breath No blood in the stools Occasional nausea with GERD symptoms Some runny nose with clear discharge.     Objective:   Physical Exam  Constitutional: He appears well-developed and well-nourished.  HENT:  Head: Normocephalic and atraumatic.       TMs  are slightly bulged but no red , no  discharge. Face is symmetric and slightly tender to palpation at  both maxillary sinuses. Nose congested. Throat without redness.  Cardiovascular: Normal rate, regular rhythm and normal heart sounds.     No murmur heard. Pulmonary/Chest:       Few rhonchi, no wheezing, no crackles, no respiratory distress at rest  Abdominal: Soft. He exhibits no distension. There is no tenderness. There is no rebound and no guarding.           Assessment & Plan:  Today I spent approximately 38 minutes with the patient and his daughter,  he has chronic GI and ENT symptoms, chart was reviewed. They like something to be done that make him  feel well. I told the daughter I'm not  sure if I can make him feel 100% better,unfortunately she thought i was sarcastic, that was not my intention at all.

## 2011-01-10 NOTE — Patient Instructions (Addendum)
Add pepcid and allegra Switch to nasonex ENT referal Keep the GI appointment

## 2011-01-10 NOTE — Assessment & Plan Note (Addendum)
Long history of sinus congestion, allergic rhinitis type symptoms, recently treated with antibiotics. Today the TMs are slightly bulge and he is a slightly tender in the maxillary sinuses, I'm not sure if he still has a infection or we are dealing mostly with allergies and chronic sinusitis. He was taken to previously the Zyrtec and astepro, d/t lack of effective and s/e (dry eyes) he was switched to Flonase; again symptoms are not well controlled. Plan: Switch to Nasonex Allergies referral CT of the sinuses to rule out infection Reluctantly agreed to prescribe Allegra, risk of sedation- dry eyes discussed with the patient and the daughter

## 2011-01-10 NOTE — Assessment & Plan Note (Addendum)
Long h/o GI issues, per records from ALF he is on protonix BID Plan: Add pepcid (does not interact w/ warfarin) Keep GI referral

## 2011-01-13 ENCOUNTER — Ambulatory Visit (INDEPENDENT_AMBULATORY_CARE_PROVIDER_SITE_OTHER)
Admission: RE | Admit: 2011-01-13 | Discharge: 2011-01-13 | Disposition: A | Discharge status: Home / Self Care | Payer: Medicare Other | Source: Ambulatory Visit | Attending: Internal Medicine | Admitting: Internal Medicine

## 2011-01-13 DIAGNOSIS — J309 Allergic rhinitis, unspecified: Secondary | ICD-10-CM

## 2011-01-16 ENCOUNTER — Telehealth: Payer: Self-pay | Admitting: *Deleted

## 2011-01-16 DIAGNOSIS — J329 Chronic sinusitis, unspecified: Secondary | ICD-10-CM

## 2011-01-16 MED ORDER — MOXIFLOXACIN HCL 400 MG PO TABS: 400.0000 mg | ORAL_TABLET | Freq: Every day | ORAL | Status: AC

## 2011-01-16 NOTE — Telephone Encounter (Signed)
Message copied by Leanne Lovely on Mon Jan 16, 2011  2:39 PM ------      Message from: Willow Ora E      Created: Mon Jan 16, 2011  1:04 PM       Advise patient's daughter:      CT showed infection in the sinus, start Avelox 400 mg daily for 14 days.      It will interfere with Coumadin, we need to check an INR in one week.      Please call the instructions for the antibiotic and INR to his assisted living facility.      Okay to refer  to allergist as planned

## 2011-01-16 NOTE — Telephone Encounter (Signed)
Message copied by Leanne Lovely on Mon Jan 16, 2011  2:40 PM ------      Message from: Willow Ora E      Created: Mon Jan 16, 2011  1:04 PM       Advise patient's daughter:      CT showed infection in the sinus, start Avelox 400 mg daily for 14 days.      It will interfere with Coumadin, we need to check an INR in one week.      Please call the instructions for the antibiotic and INR to his assisted living facility.      Okay to refer  to allergist as planned

## 2011-01-16 NOTE — Telephone Encounter (Signed)
I spoke w/ pts daughter she is aware. Spoke w/ Bea Laura will fax over orders.

## 2011-01-19 LAB — PROTIME-INR

## 2011-01-26 ENCOUNTER — Telehealth: Payer: Self-pay | Admitting: Internal Medicine

## 2011-01-26 NOTE — Telephone Encounter (Signed)
Recheck INR 02-18-11

## 2011-01-26 NOTE — Telephone Encounter (Signed)
New order faxed to [facility].

## 2011-02-02 ENCOUNTER — Telehealth: Payer: Self-pay | Admitting: *Deleted

## 2011-02-02 NOTE — Telephone Encounter (Signed)
[facility] Triage Call Report Triage Record Num: [id number] Operator: Albertine Grates Patient Name: Yorel Redder Call Date & Time: 02/01/2011 5:20:27PM Patient Phone: [phone] PCP: Nolon Rod. Paz Patient Gender: Male PCP Fax : Patient DOB: 04/25/1934 Practice Name: [facility] Reason for Call: Boneta Lucks, , calling regarding . PCP is Emm[place], [place]. Callback number is [phone]. The [facility]/Jenny, Med Tech calling and states has order for blood sugar and is if >400 call MD. Blood sugar is 493. Has order to give Novolog 22 units if blood sugar 351-400. but has not given. Also gets Lantus 78 units at bed time. Advised give med as ordered and recheck blood sugar in 1 hour and again in 4 hours. Protocol(s) Used: Diabetes: Control Problems Recommended Outcome per Protocol: See Provider within 4 hours Reason for Outcome: New or increasing symptoms or glucose out of control as defined by provider or action plan AND taking medications/following therapy as prescribed Care Advice:

## 2011-02-03 NOTE — Telephone Encounter (Signed)
Spoke w/ Crystal at [facility] she states yesterday am his BS was 81, before lunch 208, before dinner-245, before bedtime-222, this am before breakfast it was 104.

## 2011-02-03 NOTE — Telephone Encounter (Signed)
Thank you :)

## 2011-02-03 NOTE — Telephone Encounter (Signed)
Please check on pt, how are sugars this morning?

## 2011-02-15 ENCOUNTER — Encounter: Payer: Self-pay | Admitting: Podiatrist

## 2011-02-20 ENCOUNTER — Encounter: Payer: Self-pay | Admitting: Gastroenterology

## 2011-02-20 ENCOUNTER — Ambulatory Visit (INDEPENDENT_AMBULATORY_CARE_PROVIDER_SITE_OTHER): Payer: Medicare Other | Admitting: Gastroenterology

## 2011-02-20 DIAGNOSIS — K219 Gastro-esophageal reflux disease without esophagitis: Secondary | ICD-10-CM

## 2011-02-20 DIAGNOSIS — R1310 Dysphagia, unspecified: Secondary | ICD-10-CM

## 2011-02-20 NOTE — Progress Notes (Signed)
Review of pertinent gastrointestinal problems: 1. Chronic constipation likely functional, status post colonoscopy, January 2005, at [facility] that was normal, has responded very well to MiraLax.  2. Gastroparesis, likely diabetic related documented by solid phase gastric emptying scan, September 2006, with 43% of trace remaining at 120  minutes, normal is less than 30%. Nausea is mild while taking Reglan 5 mg t.i.d. No signs of severe disease such as constant nausea, vomiting, or weight  Loss.   HPI: This is a  very pleasant 77 year old man who is here with his daughter today. He has numerous significant medical problems.  He says he's had same gi  problems for 5-6 years.  He has gagging with even water, sticky feeling in throat.  Bad "case of GERD."  + pyrosis, acid taste in the mouth.  He has nausea but no vomiting.  HE eats 3 square meals a day and snack in evening.  Overall stable weight.    He is on protonix, takes it before breakast.  Takes h2 blocker once daily with meal.   He is on coumadin for afib.    Constipation is not as bad as previoulsy.  Takes senekot daily.    Review of systems: Pertinent positive and negative review of systems were noted in the above HPI section.  All other review of systems was otherwise negative.   Past Medical History  Diagnosis Date  . Diabetes mellitus 1980  . Hyperlipidemia 1980  . Hypertension 1980  . Asthma   . Allergic rhinitis   . GERD (gastroesophageal reflux disease)   . BPH (benign prostatic hypertrophy)     s/p thermo therapy, has bladdero utlet obst. and instablitiy, incontient  . Anemia   . Chronic constipation   . Sick sinus syndrome   . Paroxysmal atrial fibrillation     status post direct cardioversion- normal cardiac cath 2003, with normal left ventricular function (done after an abnormal cardiolite)  . Bradycardia     severe, on high doese AV-nodal blockers  . Bifascicular block     chronic  . Obesity   . Sleep apnea     noncomplicance w/ CPAP  . Hypoxia     in the past, likely multifactorial  . Gastroparesis     dx in 2006 through gastric scan  . Gynecomastia     nomral mammograms 11/2008    Past Surgical History  Procedure Date  . Cataract extraction     bilateral  . Rotator cuff repair     left  . Cardiac catheterization 2003    normal, left ventricular function (done after an abnormal cardiolite)  . Tumor removal     off arms     reports that he quit smoking about 39 years ago. His smoking use included Cigarettes. He has quit using smokeless tobacco. His smokeless tobacco use included Chew. He reports that he does not drink alcohol or use illicit drugs.  family history includes Heart disease in his paternal uncle and Prostate cancer in his maternal uncle.  There is no history of Colon cancer.    Current Medications, Allergies were all reviewed with the patient via [facility] electronic medical record system.    Physical Exam: BP 132/60  Pulse 68  Ht 5\' 10"  (1.778 m)  Wt 258 lb (117.028 kg)  BMI 37.02 kg/m2 Constitutional: Lethargic appearing, chronically ill, obese Psychiatric: alert and oriented x3 Eyes: extraocular movements intact Mouth: oral pharynx moist, no lesions Neck: supple no lymphadenopathy Cardiovascular: heart regular rate and rhythm  Lungs: clear to auscultation bilaterally Abdomen: soft, nontender, nondistended, no obvious ascites, no peritoneal signs, normal bowel sounds Extremities: no lower extremity edema bilaterally Skin: no lesions on visible extremities    Assessment and plan: 77 y.o. male with dyspepsia, GERD, dysphagia  He is on Coumadin which would have to be stopped prior to an EGD if I knew we are going to proceed with dilation. I want him to have a barium esophagram first that'll help no whether we will need to hold his Coumadin or not. We will schedule EGD following review of those results. For now he'll continue once daily H2 blocker, once  daily proton pump inhibitor.

## 2011-02-20 NOTE — Patient Instructions (Addendum)
Barium esophagram for GERD, dysphagia.  [name] 02/22/11 arrive at 845 am Nothing to eat or drink after midnight. EGD and possible dilation (depeding on barium esophagram), would need to have you hold coumadin for 5 days. A copy of this information will be made available to Dr. Drue Novel.

## 2011-02-21 LAB — PROTIME-INR

## 2011-02-22 ENCOUNTER — Ambulatory Visit (HOSPITAL_COMMUNITY)
Admission: RE | Admit: 2011-02-22 | Discharge: 2011-02-22 | Disposition: A | Discharge status: Home / Self Care | Payer: Medicare Other | Source: Ambulatory Visit | Attending: Gastroenterology | Admitting: Gastroenterology

## 2011-02-22 DIAGNOSIS — R109 Unspecified abdominal pain: Secondary | ICD-10-CM | POA: Insufficient documentation

## 2011-02-22 DIAGNOSIS — R110 Nausea: Secondary | ICD-10-CM | POA: Insufficient documentation

## 2011-02-22 DIAGNOSIS — K219 Gastro-esophageal reflux disease without esophagitis: Secondary | ICD-10-CM | POA: Insufficient documentation

## 2011-02-22 DIAGNOSIS — R1310 Dysphagia, unspecified: Secondary | ICD-10-CM | POA: Insufficient documentation

## 2011-02-23 ENCOUNTER — Telehealth: Payer: Self-pay | Admitting: *Deleted

## 2011-02-23 NOTE — Telephone Encounter (Signed)
PER DR. Zelphia Cairo OFFICE, PATIENT WAS A NOSHOW FOR HIS ALLERGY APPOINTMENT, ALTHOUGH I SPOKE WITH HIS NURSE & TRANSPORTATION PLANNER AT [facility] AND INFORMED THEM OF THE APPOINTMENT.  I HAVE CALLED [facility] AND WAS TRANSFERRED TO A MANAGER & HAD TO LEAVE A VM FOR HER TO CALL ME ABOUT THIS.

## 2011-02-23 NOTE — Telephone Encounter (Signed)
Received fax from [facility]- INR was 2.16, done on 02/21/11.  Advised-- Keep on same dose of coumadin recheck in 1 month- 03/24/11. (faxed over to [facility])

## 2011-02-24 NOTE — Telephone Encounter (Signed)
Thank you :)

## 2011-03-01 ENCOUNTER — Telehealth: Payer: Self-pay | Admitting: Internal Medicine

## 2011-03-01 NOTE — Telephone Encounter (Signed)
error 

## 2011-03-06 ENCOUNTER — Encounter: Payer: Self-pay | Admitting: Internal Medicine

## 2011-03-21 ENCOUNTER — Telehealth: Payer: Self-pay | Admitting: *Deleted

## 2011-03-21 NOTE — Telephone Encounter (Signed)
They are trying to move father to another facility by Thursday and will need paperwork completed by Wed.

## 2011-03-22 LAB — PROTIME-INR

## 2011-03-22 NOTE — Telephone Encounter (Signed)
Done

## 2011-03-22 NOTE — Telephone Encounter (Signed)
Please complete the FL-2 with the help of the daughter. Print and send a medication list. Next INR 03-24-11 Find his last PPD and document.

## 2011-03-26 ENCOUNTER — Telehealth: Payer: Self-pay | Admitting: Internal Medicine

## 2011-03-26 NOTE — Telephone Encounter (Signed)
INR 03-22-11 was 1.5 He has been on same coumadin dose x months : 7.5 mg qd Call to the new facility he is at Confirm his coumadin dose: 7.5mg  a day If that is what he is doing, then no change , recheck in 2 weeks

## 2011-03-29 ENCOUNTER — Other Ambulatory Visit: Payer: Medicare Other | Admitting: Gastroenterology

## 2011-03-29 NOTE — Telephone Encounter (Signed)
Left message on voicemail for patient to return call when available to discuss Dr.Paz's questions

## 2011-03-30 ENCOUNTER — Other Ambulatory Visit: Payer: Self-pay | Admitting: *Deleted

## 2011-03-30 MED ORDER — ACCU-CHEK SAFE-T PRO LANCETS MISC: Status: AC

## 2011-03-30 MED ORDER — GLUCOSE BLOOD VI STRP: ORAL_STRIP | Status: AC

## 2011-03-30 MED ORDER — "INSULIN SYRINGE 29G X 1/2"" 1 ML MISC": Status: DC

## 2011-04-04 NOTE — Telephone Encounter (Signed)
We need to call them again

## 2011-04-04 NOTE — Telephone Encounter (Signed)
According to Diane at [facility], pt is taking 7 mg qd (one 3mg  and one 4mg ). Based off this info, do you recommend a change in coumadin

## 2011-04-05 MED ORDER — WARFARIN SODIUM 7.5 MG PO TABS: 7.5000 mg | ORAL_TABLET | Freq: Every day | ORAL | Status: DC

## 2011-04-05 NOTE — Telephone Encounter (Signed)
Change to Coumadin 5 mg: 1.5 tablet daily, recheck INR in 2 weeks

## 2011-04-05 NOTE — Telephone Encounter (Signed)
Pam at [facility] is aware and Rx sent to pharmacy

## 2011-04-14 ENCOUNTER — Telehealth: Payer: Self-pay | Admitting: *Deleted

## 2011-04-14 NOTE — Telephone Encounter (Signed)
[facility] Triage Call Report Triage Record Num: [id number] Operator: Meribeth Mattes Patient Name: Tyler [name] Call Date & Time: 04/14/2011 7:14:32AM Patient Phone: [phone] PCP: Nolon Rod. Paz Patient Gender: Male PCP Fax : Patient DOB: 02/16/1934 Practice Name: [facility] Reason for Call: Danette/Med Tech, calling from [facility] [place], regarding blood sugar. PCP is Sh[place], [place]. Callback number is [phone]. Blood sugar 522 at 0650, VSS, alert and oriented, had cake at Senior Prom/party 04/13/11 at nursing home, normally has good blood sugars in the am, has SO at nursing home to give 22 units if blood sugar 350-400, if over 401, to call MD. May go ahead and give 22 units and recheck 2 hours after breakfast and if above 300, call office. TO Dr. Christella Noa.Reece,RN Protocol(s) Used: Diabetes: Control Problems Recommended Outcome per Protocol: Call Provider Immediately Reason for Outcome: Blood sugar over 400 mg/dl when tested before breakfast Physician Instructions / Orders: Care Advice: ~ 09

## 2011-04-14 NOTE — Telephone Encounter (Signed)
Spoke with someone at [employer]

## 2011-04-14 NOTE — Telephone Encounter (Signed)
Med tech call to report Pt 2 hour BS reading. Pt BS at 9:02 am was 384 then 1 hour after eating a fruit cup in light syrup BS 393. Med tech notes that Pt refused to eat anything due to BS being so elevated. Please advise

## 2011-04-14 NOTE — Telephone Encounter (Signed)
Thank you for the information, I agree. do not call for NovoLog 10 units Continue with Lantus as before

## 2011-04-14 NOTE — Telephone Encounter (Signed)
Give 10 u of novolog now. Increase Lantus from 78 to 85. Fax all CBG readings Monday. Watch for infections, that may cause his CBGs to go high

## 2011-04-14 NOTE — Telephone Encounter (Signed)
Pt BS @ 12 pm 275 and Pt was given 16 units, so do you want to still give Pt novolog now.  And Pt did not received lantus last night because BS 69 so would you like to still increase lantus from 78 to 85.  Please advise

## 2011-04-19 ENCOUNTER — Telehealth: Payer: Self-pay | Admitting: Internal Medicine

## 2011-04-19 NOTE — Telephone Encounter (Signed)
We receive INR results and a fax asking for several clarifications:  1. INR is 3.8, currently Coumadin is 5 mg 1.5 tablets daily (52.5) New: coumadin 5mg  1.5 tab daily except Monday -Wednesday-Friday take 1 tablet (45 mg /week) Next INR 2 weeks  2. taking both Flonase and Nasonex. Stop the Flonase 3. we need to clarify duplicate artificial tears orders:  we'll recommend one drop OU  every 2 hours as needed for dry eyes 4. he is also using Systane: To my knowledge that is an artificial tear, we can discontinue it and continue with generic artificial tears, see #3

## 2011-04-20 ENCOUNTER — Telehealth: Payer: Self-pay | Admitting: Gastroenterology

## 2011-04-20 NOTE — Telephone Encounter (Signed)
Information faxed to [facility].

## 2011-04-24 ENCOUNTER — Encounter: Payer: Self-pay | Admitting: Internal Medicine

## 2011-04-24 ENCOUNTER — Other Ambulatory Visit: Payer: Self-pay | Admitting: Internal Medicine

## 2011-04-24 MED ORDER — SIMETHICONE 80 MG PO CHEW: 80.0000 mg | CHEWABLE_TABLET | Freq: Four times a day (QID) | ORAL | Status: DC

## 2011-04-24 MED ORDER — INSULIN ASPART 100 UNIT/ML ~~LOC~~ SOLN: SUBCUTANEOUS | Status: DC

## 2011-04-24 NOTE — Telephone Encounter (Signed)
Rx[s] Done. 

## 2011-04-25 ENCOUNTER — Telehealth: Payer: Self-pay | Admitting: *Deleted

## 2011-04-25 NOTE — Telephone Encounter (Signed)
Caller states that patient was formerly on sliding scale with Insulin [Novolog], but EMR states SIG: 04-28-09. Caller requesting clarification as to which dosage is correct at this time.?

## 2011-04-26 ENCOUNTER — Other Ambulatory Visit: Payer: Self-pay | Admitting: Internal Medicine

## 2011-04-26 ENCOUNTER — Telehealth: Payer: Self-pay | Admitting: *Deleted

## 2011-04-26 MED ORDER — FLUTICASONE-SALMETEROL 100-50 MCG/DOSE IN AEPB: 1.0000 | INHALATION_SPRAY | Freq: Two times a day (BID) | RESPIRATORY_TRACT | Status: DC

## 2011-04-26 NOTE — Telephone Encounter (Signed)
Caller inquiring as to patient's Coumadin dosage.  Caller states that after 04/18/11 INR of 2.03, Dr. Cyndia Bent ordered a continuation of same dosage w/order to re-check INR in [2] weeks; pharmacy is wanting to verify this is correct before dispensing to patient/[facility]. Please advise.

## 2011-04-26 NOTE — Telephone Encounter (Signed)
I spoke with Tyler [name] and the pt was rescheduled for EGD and previsit for tomorrow 04/27/11.  Olegario Messier in previsit was notified of the add on

## 2011-04-26 NOTE — Telephone Encounter (Signed)
Rx Done . 

## 2011-04-27 ENCOUNTER — Other Ambulatory Visit: Payer: Self-pay | Admitting: Internal Medicine

## 2011-04-27 ENCOUNTER — Ambulatory Visit (AMBULATORY_SURGERY_CENTER): Payer: Medicare Other | Admitting: *Deleted

## 2011-04-27 VITALS — Ht 70.5 in | Wt 260.0 lb

## 2011-04-27 DIAGNOSIS — R1310 Dysphagia, unspecified: Secondary | ICD-10-CM

## 2011-04-27 NOTE — Telephone Encounter (Signed)
LMOM at Emergency Contact- daughter Layla Barter for clarification on father's medical care attending physician.

## 2011-04-27 NOTE — Telephone Encounter (Signed)
Please call the patient's daughter:  We need to clarify who is in charge is either Dr. Cyndia Bent or me. Only one doctor needs to be in charge, and if Dr. Cyndia Bent is able to round on and see  the patient , I don't mind if he takes over the case

## 2011-05-01 NOTE — Telephone Encounter (Signed)
LMOM for [2nd/other] duaghter, Roxy Horseman, of patient w/the same Urgent message to please contact our office so that we can move forward with patient's healthcare. Called [facility] and informed Lurena Joiner of the situation. She states they are going w/[facility] orders at this time. [sliding scale on Insulin].

## 2011-05-02 ENCOUNTER — Other Ambulatory Visit: Payer: Medicare Other | Admitting: Gastroenterology

## 2011-05-02 ENCOUNTER — Telehealth: Payer: Self-pay | Admitting: Gastroenterology

## 2011-05-02 NOTE — Telephone Encounter (Signed)
Alice @ [facility] needs new orders for pt.'s Endo procedure on 05-09-11 faxed to [phone]

## 2011-05-03 NOTE — Telephone Encounter (Signed)
It was his nursing facilities fault for not getting him to his procedure like they were asked to, would not charge him any fees.  Alexia Freestone, can you send orders to [facility], making sure to tell them he needs to be here on time (would tell them to bring him 1.5 hours prior to scheduled case just to be certain this time)

## 2011-05-04 NOTE — Telephone Encounter (Signed)
New Instructions faxed to [facility] at [facility]

## 2011-05-09 ENCOUNTER — Telehealth: Payer: Self-pay

## 2011-05-09 ENCOUNTER — Encounter: Payer: Self-pay | Admitting: Gastroenterology

## 2011-05-09 ENCOUNTER — Ambulatory Visit (AMBULATORY_SURGERY_CENTER): Payer: Medicare Other | Admitting: Gastroenterology

## 2011-05-09 DIAGNOSIS — K2970 Gastritis, unspecified, without bleeding: Secondary | ICD-10-CM

## 2011-05-09 DIAGNOSIS — K2940 Chronic atrophic gastritis without bleeding: Secondary | ICD-10-CM

## 2011-05-09 DIAGNOSIS — R1310 Dysphagia, unspecified: Secondary | ICD-10-CM

## 2011-05-09 LAB — GLUCOSE, CAPILLARY: Glucose-Capillary: 232 mg/dL — ABNORMAL HIGH (ref 70–99)

## 2011-05-09 MED ORDER — SODIUM CHLORIDE 0.9 % IV SOLN: 500.0000 mL | INTRAVENOUS | Status: DC

## 2011-05-09 NOTE — Patient Instructions (Signed)
See the picture page for your findings from your exam today.  Follow the green and blue discharge instruction sheets the rest of the day.  Resume your prior medications today. Please call if any questions or concerns.  

## 2011-05-09 NOTE — Telephone Encounter (Signed)
Spoke w/Rebecca at [facility], pharmacy for patient's meds to get contact information for assisted living facility where patient is located. Patient is currently at [facility] and his daughter has made the decision with Ginger [PCC] at facility to have Dr Cyndia Bent be placed in charge of her father's health care.

## 2011-05-09 NOTE — Telephone Encounter (Signed)
Message copied by Donata Duff on Tue May 09, 2011  2:42 PM ------      Message from: Rachael Fee      Created: Tue May 09, 2011  2:05 PM       He needs MBSS with speech therapy for dysphagia that does not appear to be related to his esophagus on B esophagram or EGD today.             Thanks

## 2011-05-09 NOTE — Progress Notes (Signed)
14:17 phone call to Assisted living facility and the receptionist gave me [phone] to call Raiford Noble for transportation to be picked up.  I called Raiford Noble and told him Mr. Delpilar would be ready for discharge at 14:40 pm.  He will call me back when he is at our front entrance. MAW  The pt's daughter asked me to speak with her sister on the phone about the pt's findings today.  I did so. Maw  Pt's daughter said she would give the discharge instructions to Raiford Noble, the driver for the assisted living facility.  The pt hd no complaints on discharge. maw

## 2011-05-10 ENCOUNTER — Telehealth: Payer: Self-pay

## 2011-05-10 LAB — BASIC METABOLIC PANEL
CO2: 30
Calcium: 8.8
GFR calc Af Amer: >60
GFR calc non Af Amer: 54 — ABNORMAL LOW
Glucose, Bld: 210 — ABNORMAL HIGH
Potassium: 4.6
Sodium: 142

## 2011-05-10 LAB — COMPREHENSIVE METABOLIC PANEL
ALT: 18
AST: 18
Calcium: 9
Creatinine, Ser: 2.6 — ABNORMAL HIGH
GFR calc Af Amer: 30 — ABNORMAL LOW
Sodium: 138
Total Protein: 6.8

## 2011-05-10 LAB — DIFFERENTIAL
Eosinophils Absolute: 0.3
Eosinophils Relative: 3
Lymphocytes Relative: 9 — ABNORMAL LOW
Lymphs Abs: 0.9
Monocytes Relative: 6

## 2011-05-10 LAB — PROTIME-INR: INR: 2.1 — ABNORMAL HIGH

## 2011-05-10 LAB — CBC
MCHC: 33.2
Platelets: 207
RDW: 12.9

## 2011-05-10 NOTE — Telephone Encounter (Signed)
Thank you, from this point on Dr. Cyndia Bent will be his new PCP

## 2011-05-10 NOTE — Telephone Encounter (Signed)
Never received any return calls from [facility].  Patient is now living at [facility].  I have rescheduled his Allergy consult with Dr. Stevphen Rochester of Allergy & Asthma, made Fulton Mole of [facility] aware, her phone is [phone].

## 2011-05-10 NOTE — Telephone Encounter (Signed)
Patient not at home 

## 2011-05-11 ENCOUNTER — Other Ambulatory Visit (HOSPITAL_COMMUNITY): Payer: Self-pay | Admitting: Gastroenterology

## 2011-05-11 NOTE — Telephone Encounter (Signed)
Pt and nurse at his retirement facility is aware,  Also I have spoken with his daughter and she is also aware of the appt

## 2011-05-12 ENCOUNTER — Telehealth: Payer: Self-pay | Admitting: Gastroenterology

## 2011-05-12 NOTE — Telephone Encounter (Signed)
appt changed to 05/18/11 [facility] aware

## 2011-05-17 ENCOUNTER — Other Ambulatory Visit (HOSPITAL_COMMUNITY): Payer: Medicare Other

## 2011-05-17 ENCOUNTER — Telehealth: Payer: Self-pay | Admitting: Gastroenterology

## 2011-05-17 ENCOUNTER — Ambulatory Visit (HOSPITAL_COMMUNITY): Payer: Medicare Other

## 2011-05-17 NOTE — Telephone Encounter (Signed)
All of the daughters questions were answered she wanted to know why he needed the additional MBSS I did advise her it was because no problems were found on the EGD to explain the reason for dysphagia.  She also wanted to get the path results they are not yet available I will call her when ready

## 2011-05-18 ENCOUNTER — Ambulatory Visit (HOSPITAL_COMMUNITY)
Admission: RE | Admit: 2011-05-18 | Discharge: 2011-05-18 | Disposition: A | Discharge status: Home / Self Care | Payer: Medicare Other | Source: Ambulatory Visit | Attending: Gastroenterology | Admitting: Gastroenterology

## 2011-05-18 DIAGNOSIS — R1310 Dysphagia, unspecified: Secondary | ICD-10-CM | POA: Insufficient documentation

## 2011-05-18 DIAGNOSIS — K219 Gastro-esophageal reflux disease without esophagitis: Secondary | ICD-10-CM | POA: Insufficient documentation

## 2011-05-24 ENCOUNTER — Encounter: Payer: Self-pay | Admitting: Gastroenterology

## 2011-09-18 ENCOUNTER — Ambulatory Visit (INDEPENDENT_AMBULATORY_CARE_PROVIDER_SITE_OTHER): Payer: Medicare Other | Admitting: Gastroenterology

## 2011-09-18 ENCOUNTER — Encounter: Payer: Self-pay | Admitting: Gastroenterology

## 2011-09-18 VITALS — BP 120/60 | HR 76 | Ht 70.5 in | Wt 264.8 lb

## 2011-09-18 DIAGNOSIS — R0989 Other specified symptoms and signs involving the circulatory and respiratory systems: Secondary | ICD-10-CM

## 2011-09-18 DIAGNOSIS — F458 Other somatoform disorders: Secondary | ICD-10-CM

## 2011-09-18 DIAGNOSIS — R110 Nausea: Secondary | ICD-10-CM

## 2011-09-18 MED ORDER — METOCLOPRAMIDE HCL 10 MG PO TABS: 5.0000 mg | ORAL_TABLET | Freq: Every day | ORAL | Status: DC

## 2011-09-18 NOTE — Progress Notes (Signed)
Review of pertinent gastrointestinal problems:  1. Chronic constipation likely functional, status post colonoscopy, January 2005, at [facility] that was normal, has responded very well to MiraLax.  2. Gastroparesis, likely diabetic related documented by solid phase gastric emptying scan, September 2006, with 43% of trace remaining at 120 minutes, normal is less than 30%. Nausea is mild while taking Reglan 5 mg t.i.d. No signs of severe disease such as constant nausea, vomiting, or weight Loss. 3. Dysphagia: 10/12: MBS report: "functional orpharyngreal swallow without aspiration of any consistency...recommended he continute regular diet with thin liquids".   August 2012 barium esophagram was normal. EGD October 2012 showed moderate esophagitis which was H. pylori negative on biopsy : He was recommended to chew his food well and eat slowly   HPI: This is a  pleasant 78 year old man whom I last saw 4 or 5 months ago.  "no changes in my stomach, my swallowing, but I am not constipated anymore."   It feels like he always has something stuck on throat or tonsils that cause him to gag or cough.  He feels this independent of swallowing, feels globus constantly in upper throat.   Overall stable weight.  No vomiting, but + nausea.    He wakes up with nausea in the morning that lasts for about 2 hours. He is not on the Reglan that I had previously noted him to be taken that actually helped his chronic nausea    Past Medical History  Diagnosis Date  . Diabetes mellitus 1980  . Hyperlipidemia 1980  . Hypertension 1980  . Asthma   . Allergic rhinitis   . GERD (gastroesophageal reflux disease)   . BPH (benign prostatic hypertrophy)     s/p thermo therapy, has bladdero utlet obst. and instablitiy, incontient  . Anemia   . Chronic constipation   . Sick sinus syndrome   . Paroxysmal atrial fibrillation     status post direct cardioversion- normal cardiac cath 2003, with normal left ventricular function (done  after an abnormal cardiolite)  . Bradycardia     severe, on high doese AV-nodal blockers  . Bifascicular block     chronic  . Obesity   . Sleep apnea     noncomplicance w/ CPAP  . Hypoxia     in the past, likely multifactorial  . Gastroparesis     dx in 2006 through gastric scan  . Gynecomastia     nomral mammograms 11/2008    Past Surgical History  Procedure Date  . Cataract extraction     bilateral  . Rotator cuff repair     left  . Cardiac catheterization 2003    normal, left ventricular function (done after an abnormal cardiolite)  . Tumor removal     off arms    Current Outpatient Prescriptions  Medication Sig Dispense Refill  . acetaminophen (TYLENOL) 160 MG/5ML elixir Take 10 mg/kg by mouth every 4 (four) hours as needed.        Marland Kitchen ADVAIR DISKUS 100-50 MCG/DOSE AEPB PROVIDE AND INHALE 1 PUFFBY MOUTH TWICEDAILY.RINSE MOUTH AND    SPIT AFTER EACH USE.  60 each  3  . albuterol (VENTOLIN HFA) 108 (90 BASE) MCG/ACT inhaler Inhale 2 puffs into the lungs every 4 (four) hours as needed.        . bismuth subsalicylate (PEPTO-BISMOL) 262 MG/15ML suspension Take 15 mLs by mouth as needed.        . carvedilol (COREG) 6.25 MG tablet Take 6.25 mg by mouth 2 (two)  times daily with a meal.        . cefdinir (OMNICEF) 300 MG capsule Take 300 mg by mouth 2 (two) times daily.      . cycloSPORINE (RESTASIS) 0.05 % ophthalmic emulsion 1 drop 2 (two) times daily.        Marland Kitchen dextrose (GLUTOSE) 40 % GEL Take 1 Tube by mouth once. If cbg <60       . donepezil (ARICEPT) 10 MG tablet Take 10 mg by mouth at bedtime.        Marland Kitchen doxazosin (CARDURA) 4 MG tablet Take 4 mg by mouth at bedtime.        . ergocalciferol (VITAMIN D2) 50000 UNITS capsule 1 by mouth every Friday.       . famotidine (PEPCID) 40 MG tablet Take 1 tablet (40 mg total) by mouth at bedtime.  90 tablet  1  . ferrous sulfate 325 (65 FE) MG tablet Take 325 mg by mouth daily with breakfast.        . fexofenadine (ALLEGRA) 60 MG tablet  Take 1 tablet (60 mg total) by mouth daily.  30 tablet  12  . finasteride (PROSCAR) 5 MG tablet Take 5 mg by mouth daily.        Marland Kitchen glucose blood test strip Use as instructed  100 each  2  . guaiFENesin (ROBITUSSIN) 100 MG/5ML liquid Take 200 mg by mouth 3 (three) times daily as needed.        . hydroxypropyl methylcellulose (ISOPTO TEARS) 2.5 % ophthalmic solution Apply to both eyes as frequent as every 2 hours prn for dry eye       . insulin glargine (LANTUS) 100 UNIT/ML injection Inject 78 Units into the skin at bedtime.  1 mL  0  . INSULIN SYRINGE 1CC/29G (SAFETY-LOK INSULIN SYR 1CC/29G) 29G X 1/2" 1 ML MISC USE AS DIRECTED.  100 each  2  . Lancets (ACCU-CHEK SAFE-T PRO) lancets Use as instructed  100 each  2  . metFORMIN (GLUCOPHAGE-XR) 750 MG 24 hr tablet Take 750 mg by mouth daily with breakfast.        . mometasone (NASONEX) 50 MCG/ACT nasal spray Place 2 sprays into the nose daily.  17 g  12  . multivitamin (THERAGRAN) per tablet Take 1 tablet by mouth daily.        Marland Kitchen MYLANTA GAS 80 MG chewable tablet CHEW 1 TABLET BY MOUTH 4 TIMES DAILY.  120 each  1  . NOVOLOG 100 UNIT/ML injection USE AS DIRECTED PER      SLIDING SCALE: 110-150=5UNITS, 151-200=8 UNITS,  201-250=12 UNITS,  10 mL  3  . pantoprazole (PROTONIX) 40 MG tablet Take 40 mg by mouth daily.        Bertram Gala Glycol-Propyl Glycol (SYSTANE OP) Apply to eye.        . polyethylene glycol powder (MIRALAX) powder Take 17 g by mouth daily as needed.        . sennosides-docusate sodium (SENOKOT-S) 8.6-50 MG tablet Take 2 tablets by mouth at bedtime.        . sertraline (ZOLOFT) 25 MG tablet Take 25 mg by mouth daily.        . simvastatin (ZOCOR) 20 MG tablet Take 20 mg by mouth at bedtime.        Marland Kitchen spironolactone (ALDACTONE) 25 MG tablet Take 25 mg by mouth 2 (two) times daily.        Marland Kitchen torsemide (DEMADEX) 100 MG tablet Take 50 mg  by mouth daily.        Marland Kitchen warfarin (COUMADIN) 7.5 MG tablet Take 1.5 tabs once a day        Allergies as  of 09/18/2011  . (No Known Allergies)    Family History  Problem Relation Age of Onset  . Colon cancer Neg Hx   . Prostate cancer Maternal Uncle   . Heart disease Paternal Uncle     x 3, paternal aunt    History   Social History  . Marital Status: Widowed    Spouse Name: N/A    Number of Children: 2  . Years of Education: N/A   Occupational History  . retired    Social History Main Topics  . Smoking status: Former Smoker    Types: Cigarettes    Quit date: 07/25/1971  . Smokeless tobacco: Former Neurosurgeon    Types: Chew  . Alcohol Use: No  . Drug Use: No  . Sexually Active: Not on file   Other Topics Concern  . Not on file   Social History Narrative   Has lived in facility x aprox 4 years[place] to [place] 2[place]2010Does not have a POADoes not have a living will      Physical Exam: BP 120/60  Pulse 76  Ht 5' 10.5" (1.791 m)  Wt 264 lb 12.8 oz (120.112 kg)  BMI 37.46 kg/m2 Constitutional: Obese, chronically ill-appearing, sits in a wheelchair Psychiatric: alert and oriented x3 Abdomen: soft, nontender, nondistended, no obvious ascites, no peritoneal signs, normal bowel sounds     Assessment and plan: 78 y.o. male with globus sensation, gastroparesis  He has had multiple esophageal studies and I cannot explain his globus sensation. We will refer him to ear nose and throat physician for further evaluation. In the meantime I've instructed him to eat slowly, chew his food well, take small bites. He has previously documented gastroparesis and Reglan actually helped his symptoms in the past. I will restart him on 5 mg each bedtime on a scheduled basis to help his predominantly morning gastroparesis symptoms.

## 2011-09-18 NOTE — Patient Instructions (Addendum)
You should chew your food very well, eat slowly and take small bites. We will set up referral to ENT doctor for your persistent globus.  [facility] ENT Dr Pollyann Kennedy appointment on 09/21/11 at 2 pm please arrive at 1:45 pm.  The phone number for their office is [phone]. New prescription called in, reglan 5mg  pill, one pill at bedtime every night to help your diabetic gastroparesis, AM nausea. A copy of this information will be made available to Dr. Cyndia Bent.

## 2012-05-29 ENCOUNTER — Other Ambulatory Visit: Payer: Self-pay

## 2012-05-29 ENCOUNTER — Encounter (HOSPITAL_COMMUNITY): Payer: Self-pay | Admitting: Emergency Medicine

## 2012-05-29 ENCOUNTER — Emergency Department (HOSPITAL_COMMUNITY): Payer: Medicare Other

## 2012-05-29 ENCOUNTER — Inpatient Hospital Stay (HOSPITAL_COMMUNITY)
Admission: EM | Admit: 2012-05-29 | Discharge: 2012-06-10 | DRG: 243 | Disposition: A | Discharge status: Skilled Nursing Facility | Payer: Medicare Other | Attending: Family Medicine | Admitting: Family Medicine

## 2012-05-29 DIAGNOSIS — Z9119 Patient's noncompliance with other medical treatment and regimen: Secondary | ICD-10-CM

## 2012-05-29 DIAGNOSIS — R945 Abnormal results of liver function studies: Secondary | ICD-10-CM

## 2012-05-29 DIAGNOSIS — Z91199 Patient's noncompliance with other medical treatment and regimen due to unspecified reason: Secondary | ICD-10-CM

## 2012-05-29 DIAGNOSIS — E669 Obesity, unspecified: Secondary | ICD-10-CM | POA: Diagnosis present

## 2012-05-29 DIAGNOSIS — E1149 Type 2 diabetes mellitus with other diabetic neurological complication: Secondary | ICD-10-CM | POA: Diagnosis present

## 2012-05-29 DIAGNOSIS — E875 Hyperkalemia: Secondary | ICD-10-CM | POA: Diagnosis present

## 2012-05-29 DIAGNOSIS — I4891 Unspecified atrial fibrillation: Secondary | ICD-10-CM | POA: Diagnosis present

## 2012-05-29 DIAGNOSIS — Z79899 Other long term (current) drug therapy: Secondary | ICD-10-CM

## 2012-05-29 DIAGNOSIS — N183 Chronic kidney disease, stage 3 unspecified: Secondary | ICD-10-CM | POA: Diagnosis present

## 2012-05-29 DIAGNOSIS — K560 Paralytic ileus: Secondary | ICD-10-CM | POA: Diagnosis not present

## 2012-05-29 DIAGNOSIS — K5989 Other specified functional intestinal disorders: Secondary | ICD-10-CM | POA: Diagnosis present

## 2012-05-29 DIAGNOSIS — K3184 Gastroparesis: Secondary | ICD-10-CM | POA: Diagnosis present

## 2012-05-29 DIAGNOSIS — E119 Type 2 diabetes mellitus without complications: Secondary | ICD-10-CM | POA: Diagnosis present

## 2012-05-29 DIAGNOSIS — B961 Klebsiella pneumoniae [K. pneumoniae] as the cause of diseases classified elsewhere: Secondary | ICD-10-CM | POA: Diagnosis present

## 2012-05-29 DIAGNOSIS — N179 Acute kidney failure, unspecified: Secondary | ICD-10-CM | POA: Diagnosis present

## 2012-05-29 DIAGNOSIS — E662 Morbid (severe) obesity with alveolar hypoventilation: Secondary | ICD-10-CM | POA: Diagnosis present

## 2012-05-29 DIAGNOSIS — J209 Acute bronchitis, unspecified: Secondary | ICD-10-CM

## 2012-05-29 DIAGNOSIS — N400 Enlarged prostate without lower urinary tract symptoms: Secondary | ICD-10-CM | POA: Diagnosis present

## 2012-05-29 DIAGNOSIS — E785 Hyperlipidemia, unspecified: Secondary | ICD-10-CM | POA: Diagnosis present

## 2012-05-29 DIAGNOSIS — N390 Urinary tract infection, site not specified: Secondary | ICD-10-CM | POA: Diagnosis present

## 2012-05-29 DIAGNOSIS — K5909 Other constipation: Secondary | ICD-10-CM | POA: Diagnosis present

## 2012-05-29 DIAGNOSIS — I452 Bifascicular block: Secondary | ICD-10-CM | POA: Insufficient documentation

## 2012-05-29 DIAGNOSIS — I5032 Chronic diastolic (congestive) heart failure: Secondary | ICD-10-CM | POA: Diagnosis present

## 2012-05-29 DIAGNOSIS — Z7901 Long term (current) use of anticoagulants: Secondary | ICD-10-CM

## 2012-05-29 DIAGNOSIS — I10 Essential (primary) hypertension: Secondary | ICD-10-CM | POA: Diagnosis present

## 2012-05-29 DIAGNOSIS — R32 Unspecified urinary incontinence: Secondary | ICD-10-CM

## 2012-05-29 DIAGNOSIS — I498 Other specified cardiac arrhythmias: Secondary | ICD-10-CM | POA: Diagnosis present

## 2012-05-29 DIAGNOSIS — Z87891 Personal history of nicotine dependence: Secondary | ICD-10-CM

## 2012-05-29 DIAGNOSIS — R001 Bradycardia, unspecified: Secondary | ICD-10-CM | POA: Diagnosis present

## 2012-05-29 DIAGNOSIS — H04129 Dry eye syndrome of unspecified lacrimal gland: Secondary | ICD-10-CM

## 2012-05-29 DIAGNOSIS — Z950 Presence of cardiac pacemaker: Secondary | ICD-10-CM

## 2012-05-29 DIAGNOSIS — R933 Abnormal findings on diagnostic imaging of other parts of digestive tract: Secondary | ICD-10-CM

## 2012-05-29 DIAGNOSIS — J309 Allergic rhinitis, unspecified: Secondary | ICD-10-CM

## 2012-05-29 DIAGNOSIS — J45909 Unspecified asthma, uncomplicated: Secondary | ICD-10-CM

## 2012-05-29 DIAGNOSIS — Z794 Long term (current) use of insulin: Secondary | ICD-10-CM

## 2012-05-29 DIAGNOSIS — K219 Gastro-esophageal reflux disease without esophagitis: Secondary | ICD-10-CM | POA: Diagnosis present

## 2012-05-29 DIAGNOSIS — G4733 Obstructive sleep apnea (adult) (pediatric): Secondary | ICD-10-CM | POA: Diagnosis present

## 2012-05-29 DIAGNOSIS — I495 Sick sinus syndrome: Principal | ICD-10-CM | POA: Diagnosis present

## 2012-05-29 DIAGNOSIS — D649 Anemia, unspecified: Secondary | ICD-10-CM | POA: Diagnosis present

## 2012-05-29 DIAGNOSIS — D72829 Elevated white blood cell count, unspecified: Secondary | ICD-10-CM | POA: Diagnosis not present

## 2012-05-29 DIAGNOSIS — K5981 Ogilvie syndrome: Secondary | ICD-10-CM | POA: Diagnosis present

## 2012-05-29 DIAGNOSIS — N289 Disorder of kidney and ureter, unspecified: Secondary | ICD-10-CM

## 2012-05-29 DIAGNOSIS — I129 Hypertensive chronic kidney disease with stage 1 through stage 4 chronic kidney disease, or unspecified chronic kidney disease: Secondary | ICD-10-CM | POA: Diagnosis present

## 2012-05-29 DIAGNOSIS — E860 Dehydration: Secondary | ICD-10-CM | POA: Diagnosis present

## 2012-05-29 HISTORY — DX: Chronic kidney disease, unspecified: N18.9

## 2012-05-29 HISTORY — DX: Other specified symptoms and signs involving the circulatory and respiratory systems: R09.89

## 2012-05-29 HISTORY — DX: Foreign body sensation, throat: R09.A2

## 2012-05-29 HISTORY — DX: Other specified symptoms and signs involving the digestive system and abdomen: R19.8

## 2012-05-29 LAB — POCT I-STAT, CHEM 8
BUN: 19 mg/dL (ref 6–23)
Calcium, Ion: 1.13 mmol/L (ref 1.13–1.30)
Chloride: 107 mEq/L (ref 96–112)
Creatinine, Ser: 1.6 mg/dL — ABNORMAL HIGH (ref 0.50–1.35)

## 2012-05-29 LAB — BASIC METABOLIC PANEL
BUN: 20 mg/dL (ref 6–23)
CO2: 22 mEq/L (ref 19–32)
Calcium: 8.6 mg/dL (ref 8.4–10.5)
Creatinine, Ser: 1.83 mg/dL — ABNORMAL HIGH (ref 0.50–1.35)
GFR calc non Af Amer: 34 mL/min — ABNORMAL LOW (ref >90)
Glucose, Bld: 125 mg/dL — ABNORMAL HIGH (ref 70–99)

## 2012-05-29 LAB — COMPREHENSIVE METABOLIC PANEL
ALT: 44 U/L (ref 0–53)
Alkaline Phosphatase: 90 U/L (ref 39–117)
BUN: 18 mg/dL (ref 6–23)
CO2: 22 mEq/L (ref 19–32)
Chloride: 103 mEq/L (ref 96–112)
GFR calc Af Amer: 45 mL/min — ABNORMAL LOW (ref >90)
GFR calc non Af Amer: 39 mL/min — ABNORMAL LOW (ref >90)
Glucose, Bld: 311 mg/dL — ABNORMAL HIGH (ref 70–99)
Potassium: 5.8 mEq/L — ABNORMAL HIGH (ref 3.5–5.1)
Sodium: 135 mEq/L (ref 135–145)
Total Bilirubin: 0.2 mg/dL — ABNORMAL LOW (ref 0.3–1.2)
Total Protein: 6.7 g/dL (ref 6.0–8.3)

## 2012-05-29 LAB — URINALYSIS, ROUTINE W REFLEX MICROSCOPIC
Glucose, UA: 100 mg/dL — AB
Ketones, ur: NEGATIVE mg/dL
Protein, ur: 100 mg/dL — AB
pH: 5 (ref 5.0–8.0)

## 2012-05-29 LAB — URINE MICROSCOPIC-ADD ON

## 2012-05-29 LAB — MRSA PCR SCREENING: MRSA by PCR: NEGATIVE

## 2012-05-29 LAB — TROPONIN I
Troponin I: <0.3 ng/mL (ref <0.30)
Troponin I: <0.3 ng/mL (ref <0.30)

## 2012-05-29 LAB — CBC WITH DIFFERENTIAL/PLATELET
Basophils Relative: 0 % (ref 0–1)
HCT: 34 % — ABNORMAL LOW (ref 39.0–52.0)
Hemoglobin: 11.2 g/dL — ABNORMAL LOW (ref 13.0–17.0)
Lymphocytes Relative: 13 % (ref 12–46)
Lymphs Abs: 1.1 10*3/uL (ref 0.7–4.0)
MCHC: 32.9 g/dL (ref 30.0–36.0)
Monocytes Absolute: 0.4 10*3/uL (ref 0.1–1.0)
Monocytes Relative: 5 % (ref 3–12)
Neutro Abs: 6.2 10*3/uL (ref 1.7–7.7)
Neutrophils Relative %: 78 % — ABNORMAL HIGH (ref 43–77)
RBC: 3.84 MIL/uL — ABNORMAL LOW (ref 4.22–5.81)

## 2012-05-29 LAB — GLUCOSE, CAPILLARY: Glucose-Capillary: 223 mg/dL — ABNORMAL HIGH (ref 70–99)

## 2012-05-29 LAB — CK TOTAL AND CKMB (NOT AT ARMC): Total CK: 65 U/L (ref 7–232)

## 2012-05-29 MED ORDER — POLYETHYLENE GLYCOL 3350 17 GM/SCOOP PO POWD
17.0000 g | Freq: Every day | ORAL | Status: DC | PRN
  Filled 2012-05-29: qty 255

## 2012-05-29 MED ORDER — BEPOTASTINE BESILATE 1.5 % OP SOLN: 1.0000 [drp] | Freq: Two times a day (BID) | OPHTHALMIC | Status: DC

## 2012-05-29 MED ORDER — MULTIVITAMINS PO TABS: 1.0000 | ORAL_TABLET | Freq: Every day | ORAL | Status: DC

## 2012-05-29 MED ORDER — ADULT MULTIVITAMIN W/MINERALS CH
1.0000 | ORAL_TABLET | Freq: Every day | ORAL | Status: DC
  Administered 2012-05-30 – 2012-06-03 (×5): 1 via ORAL
  Filled 2012-05-29 (×6): qty 1

## 2012-05-29 MED ORDER — PANTOPRAZOLE SODIUM 40 MG PO TBEC
40.0000 mg | DELAYED_RELEASE_TABLET | Freq: Two times a day (BID) | ORAL | Status: DC
  Administered 2012-05-29 – 2012-05-30 (×3): 40 mg via ORAL
  Filled 2012-05-29 (×4): qty 1

## 2012-05-29 MED ORDER — FERROUS SULFATE 325 (65 FE) MG PO TABS
325.0000 mg | ORAL_TABLET | Freq: Every day | ORAL | Status: DC
  Administered 2012-05-30 – 2012-06-03 (×5): 325 mg via ORAL
  Filled 2012-05-29 (×6): qty 1

## 2012-05-29 MED ORDER — BACITRACIN-NEOMYCIN-POLYMYXIN OINTMENT TUBE
1.0000 "application " | TOPICAL_OINTMENT | Freq: Every day | CUTANEOUS | Status: DC
  Administered 2012-05-30 – 2012-06-10 (×12): 1 via TOPICAL
  Filled 2012-05-29 (×2): qty 15

## 2012-05-29 MED ORDER — CROMOLYN SODIUM 4 % OP SOLN
1.0000 [drp] | Freq: Two times a day (BID) | OPHTHALMIC | Status: DC
  Administered 2012-05-29 – 2012-06-10 (×24): 1 [drp] via OPHTHALMIC
  Filled 2012-05-29 (×3): qty 10

## 2012-05-29 MED ORDER — SENNA-DOCUSATE SODIUM 8.6-50 MG PO TABS
2.0000 | ORAL_TABLET | Freq: Every evening | ORAL | Status: DC | PRN
  Filled 2012-05-29: qty 2

## 2012-05-29 MED ORDER — SERTRALINE HCL 25 MG PO TABS
25.0000 mg | ORAL_TABLET | Freq: Every day | ORAL | Status: DC
  Administered 2012-05-30 – 2012-06-10 (×12): 25 mg via ORAL
  Filled 2012-05-29 (×12): qty 1

## 2012-05-29 MED ORDER — ALBUTEROL SULFATE (5 MG/ML) 0.5% IN NEBU
2.5000 mg | INHALATION_SOLUTION | RESPIRATORY_TRACT | Status: DC | PRN
  Administered 2012-05-30: 2.5 mg via RESPIRATORY_TRACT
  Filled 2012-05-29: qty 0.5

## 2012-05-29 MED ORDER — DEXTROSE 5 % IV SOLN
1.0000 g | INTRAVENOUS | Status: DC
  Administered 2012-05-30 – 2012-06-03 (×5): 1 g via INTRAVENOUS
  Filled 2012-05-29 (×7): qty 10

## 2012-05-29 MED ORDER — SODIUM CHLORIDE 0.9 % IV SOLN
INTRAVENOUS | Status: DC
  Administered 2012-05-29: 23:00:00 via INTRAVENOUS

## 2012-05-29 MED ORDER — SIMETHICONE 80 MG PO CHEW
80.0000 mg | CHEWABLE_TABLET | Freq: Four times a day (QID) | ORAL | Status: DC | PRN
  Filled 2012-05-29: qty 1

## 2012-05-29 MED ORDER — INSULIN GLARGINE 100 UNIT/ML ~~LOC~~ SOLN
39.0000 [IU] | Freq: Every day | SUBCUTANEOUS | Status: DC
  Administered 2012-05-29 – 2012-06-01 (×4): 39 [IU] via SUBCUTANEOUS

## 2012-05-29 MED ORDER — ALUM & MAG HYDROXIDE-SIMETH 200-200-20 MG/5ML PO SUSP: 30.0000 mL | Freq: Four times a day (QID) | ORAL | Status: DC | PRN

## 2012-05-29 MED ORDER — SODIUM POLYSTYRENE SULFONATE 15 GM/60ML PO SUSP
30.0000 g | Freq: Once | ORAL | Status: AC
  Administered 2012-05-29: 30 g via ORAL
  Filled 2012-05-29: qty 120

## 2012-05-29 MED ORDER — DEXTROSE 5 % IV SOLN
1.0000 g | Freq: Once | INTRAVENOUS | Status: AC
  Administered 2012-05-29: 1 g via INTRAVENOUS
  Filled 2012-05-29: qty 10

## 2012-05-29 MED ORDER — ASPIRIN EC 81 MG PO TBEC
81.0000 mg | DELAYED_RELEASE_TABLET | Freq: Every day | ORAL | Status: DC
  Administered 2012-05-30 – 2012-06-09 (×11): 81 mg via ORAL
  Filled 2012-05-29 (×13): qty 1

## 2012-05-29 MED ORDER — SIMVASTATIN 20 MG PO TABS
20.0000 mg | ORAL_TABLET | Freq: Every day | ORAL | Status: DC
  Administered 2012-05-29 – 2012-06-03 (×6): 20 mg via ORAL
  Filled 2012-05-29 (×9): qty 1

## 2012-05-29 MED ORDER — INSULIN ASPART 100 UNIT/ML ~~LOC~~ SOLN
6.0000 [IU] | Freq: Once | SUBCUTANEOUS | Status: AC
  Administered 2012-05-29: 6 [IU] via INTRAVENOUS
  Filled 2012-05-29: qty 1

## 2012-05-29 MED ORDER — NITROGLYCERIN IN D5W 200-5 MCG/ML-% IV SOLN: 2.0000 ug/min | Freq: Once | INTRAVENOUS | Status: DC

## 2012-05-29 MED ORDER — TROLAMINE SALICYLATE 10 % EX CREA
1.0000 "application " | TOPICAL_CREAM | Freq: Three times a day (TID) | CUTANEOUS | Status: DC
  Administered 2012-05-30 – 2012-06-06 (×16): 1 via TOPICAL
  Filled 2012-05-29 (×7): qty 85

## 2012-05-29 MED ORDER — ONDANSETRON HCL 4 MG/2ML IJ SOLN
4.0000 mg | Freq: Four times a day (QID) | INTRAMUSCULAR | Status: DC | PRN
  Administered 2012-06-05: 4 mg via INTRAVENOUS
  Filled 2012-05-29 (×2): qty 2

## 2012-05-29 MED ORDER — SODIUM CHLORIDE 0.9 % IJ SOLN
3.0000 mL | Freq: Two times a day (BID) | INTRAMUSCULAR | Status: DC
  Administered 2012-05-30 – 2012-06-05 (×13): 3 mL via INTRAVENOUS

## 2012-05-29 MED ORDER — SODIUM CHLORIDE 0.9 % IV SOLN
INTRAVENOUS | Status: DC
  Administered 2012-05-29: 13:00:00 via INTRAVENOUS

## 2012-05-29 MED ORDER — ONDANSETRON HCL 4 MG PO TABS: 4.0000 mg | ORAL_TABLET | Freq: Four times a day (QID) | ORAL | Status: DC | PRN

## 2012-05-29 MED ORDER — INSULIN ASPART 100 UNIT/ML ~~LOC~~ SOLN
0.0000 [IU] | Freq: Three times a day (TID) | SUBCUTANEOUS | Status: DC
  Administered 2012-05-30 (×2): 2 [IU] via SUBCUTANEOUS
  Administered 2012-05-31: 7 [IU] via SUBCUTANEOUS
  Administered 2012-05-31: 3 [IU] via SUBCUTANEOUS
  Administered 2012-05-31: 2 [IU] via SUBCUTANEOUS
  Administered 2012-06-01: 3 [IU] via SUBCUTANEOUS
  Administered 2012-06-01 (×2): 5 [IU] via SUBCUTANEOUS

## 2012-05-29 MED ORDER — IPRATROPIUM BROMIDE 0.06 % NA SOLN
2.0000 | Freq: Three times a day (TID) | NASAL | Status: DC
  Administered 2012-05-29 – 2012-06-10 (×31): 2 via NASAL
  Filled 2012-05-29 (×3): qty 15

## 2012-05-29 MED ORDER — ATROPINE SULFATE 0.1 MG/ML IJ SOLN
0.5000 mg | INTRAMUSCULAR | Status: DC | PRN
  Filled 2012-05-29: qty 5

## 2012-05-29 MED ORDER — MOMETASONE FURO-FORMOTEROL FUM 100-5 MCG/ACT IN AERO
2.0000 | INHALATION_SPRAY | Freq: Two times a day (BID) | RESPIRATORY_TRACT | Status: DC
  Administered 2012-05-29 – 2012-06-03 (×11): 2 via RESPIRATORY_TRACT
  Filled 2012-05-29: qty 8.8

## 2012-05-29 MED ORDER — ACETAMINOPHEN 500 MG PO TABS: 1000.0000 mg | ORAL_TABLET | Freq: Four times a day (QID) | ORAL | Status: DC | PRN

## 2012-05-29 MED ORDER — SALINE SPRAY 0.65 % NA SOLN
1.0000 | Freq: Four times a day (QID) | NASAL | Status: DC
  Administered 2012-05-29 – 2012-06-10 (×43): 1 via NASAL
  Filled 2012-05-29 (×3): qty 44

## 2012-05-29 NOTE — H&P (Signed)
PATIENT DETAILS Name: Tyler [name] Age: 78 y.o. Sex: male Date of Birth: Jun 07, 1934 Admit Date: 05/29/2012 RUE:[id number] C, MD   CHIEF COMPLAINT:  Abdominal pain, chest pain, bloating and headache  HPI: Patient is a 78 year old black male resident of a skilled nursing facility, history of chronic diastolic dysfunction history of paroxysmal atrial fibrillation on chronic Coumadin therapy, history of sick sinus syndrome, hypertension, diabetes who was transferred from the skilled nursing facility for evaluation of the above-noted complaints. Upon evaluation by EMS in the field, he was found to be bradycardic. ECG revealed a junctional rhythm. Patient denies any ongoing dizziness, but he claims that he has been having intermittent dizziness, exertional dyspnea, headache, abdominal bloating intermittently for the past few months. This morning he claims that he had some left sided chest pain, that he cannot describe very well. There is no radiation of the pain. It is not clear whether this chest pain was associated with shortness of breath or nausea. He claims that he can only ambulate around 10-15 feet before he gets short of breath, over the past few days he does claim that he gets lightheaded when he walks. He denies any fever, he denies any cough. He denies any vomiting. Denies any diarrhea. In the ED he was found to have a junctional rhythm, cardiology has already evaluated the patient. He also was found to have hyperkalemia. Because of his significant medical comorbidities I was asked to admit this patient for further evaluation  ALLERGIES:  No Known Allergies  PAST MEDICAL HISTORY: Past Medical History  Diagnosis Date  . Diabetes mellitus 1980  . Hyperlipidemia 1980  . Hypertension 1980  . Asthma   . Allergic rhinitis   . GERD (gastroesophageal reflux disease)   . BPH (benign prostatic hypertrophy)     s/p thermo therapy, has bladdero utlet obst. and instablitiy, incontient  .  Anemia   . Chronic constipation   . Sick sinus syndrome   . Paroxysmal atrial fibrillation     status post direct cardioversion- normal cardiac cath 2003, with normal left ventricular function (done after an abnormal cardiolite)  . Bradycardia     severe, on high doese AV-nodal blockers  . Bifascicular block     chronic  . Obesity   . Sleep apnea     noncomplicance w/ CPAP  . Hypoxia     in the past, likely multifactorial  . Gastroparesis     dx in 2006 through gastric scan  . Gynecomastia     nomral mammograms 11/2008    PAST SURGICAL HISTORY: Past Surgical History  Procedure Date  . Cataract extraction     bilateral  . Rotator cuff repair     left  . Cardiac catheterization 2003    normal, left ventricular function (done after an abnormal cardiolite)  . Tumor removal     off arms    MEDICATIONS AT HOME: Prior to Admission medications   Medication Sig Start Date End Date Taking? Authorizing Provider  acetaminophen (TYLENOL) 500 MG tablet Take 1,000 mg by mouth every 6 (six) hours as needed. For severe headache   Yes Historical Provider, MD  ADVAIR DISKUS 100-50 MCG/DOSE AEPB PROVIDE AND INHALE 1 PUFFBY MOUTH TWICEDAILY.RINSE MOUTH AND    SPIT AFTER EACH USE. 04/27/11  Yes Wanda Plump, MD  albuterol (VENTOLIN HFA) 108 (90 BASE) MCG/ACT inhaler Inhale 2 puffs into the lungs every 4 (four) hours as needed. For shortness of breath   Yes Historical Provider, MD  Bepotastine  Besilate (BEPREVE) 1.5 % SOLN Place 1 drop into both eyes 2 (two) times daily.   Yes Historical Provider, MD  carvedilol (COREG) 6.25 MG tablet Take 6.25 mg by mouth 2 (two) times daily with a meal.     Yes Historical Provider, MD  cycloSPORINE (RESTASIS) 0.05 % ophthalmic emulsion Place 1 drop into both eyes 2 (two) times daily.    Yes Historical Provider, MD  dextrose (GLUTOSE) 40 % GEL Take 1 Tube by mouth once as needed. If cbg <60   Yes Historical Provider, MD  donepezil (ARICEPT) 10 MG tablet Take 10 mg  by mouth at bedtime.     Yes Historical Provider, MD  doxazosin (CARDURA) 4 MG tablet Take 4 mg by mouth at bedtime.     Yes Historical Provider, MD  ergocalciferol (VITAMIN D2) 50000 UNITS capsule 1 by mouth every Friday.    Yes Historical Provider, MD  famotidine (PEPCID) 40 MG tablet Take 40 mg by mouth at bedtime.   Yes Historical Provider, MD  ferrous sulfate 325 (65 FE) MG tablet Take 325 mg by mouth daily with breakfast.     Yes Historical Provider, MD  fexofenadine (ALLEGRA) 180 MG tablet Take 180 mg by mouth daily as needed. For seasonal allergies   Yes Historical Provider, MD  hydroxypropyl methylcellulose (ISOPTO TEARS) 2.5 % ophthalmic solution Apply to both eyes as frequent as every 2 hours prn for dry eye    Yes Historical Provider, MD  insulin aspart (NOVOLOG) 100 UNIT/ML injection Inject 5-24 Units into the skin 2 (two) times daily with breakfast and lunch. Sliding scale:  110-150= 5 units; 151-22= 8 units; 201-250= 12 units; 251-300= 16 units; 301-350= 18 units; 351-400= 22 units; if >567 administer 24 units   Yes Historical Provider, MD  insulin glargine (LANTUS) 100 UNIT/ML injection Inject 39 Units into the skin at bedtime. 05/30/12 06/05/12 Yes Historical Provider, MD  INSULIN SYRINGE 1CC/29G (SAFETY-LOK INSULIN SYR 1CC/29G) 29G X 1/2" 1 ML MISC USE AS DIRECTED. 03/30/11  Yes Wanda Plump, MD  ipratropium (ATROVENT) 0.06 % nasal spray Place 2 sprays into the nose 3 (three) times daily.   Yes Historical Provider, MD  metFORMIN (GLUCOPHAGE-XR) 750 MG 24 hr tablet Take 750 mg by mouth daily with breakfast.     Yes Historical Provider, MD  metoCLOPramide (REGLAN) 10 MG tablet Take 0.5 tablets (5 mg total) by mouth at bedtime. 09/18/11 09/17/12 Yes Rachael Fee, MD  multivitamin [facility]) per tablet Take 1 tablet by mouth daily.     Yes Historical Provider, MD  neomycin-bacitracin-polymyxin (NEOSPORIN) OINT Apply 1 application topically daily. To left big toe   Yes Historical Provider, MD   pantoprazole (PROTONIX) 40 MG tablet Take 40 mg by mouth 2 (two) times daily.    Yes Historical Provider, MD  polyethylene glycol powder (MIRALAX) powder Take 17 g by mouth daily as needed. For constipation   Yes Historical Provider, MD  sennosides-docusate sodium (SENOKOT-S) 8.6-50 MG tablet Take 2 tablets by mouth at bedtime as needed. For constipation   Yes Historical Provider, MD  sertraline (ZOLOFT) 25 MG tablet Take 25 mg by mouth daily.     Yes Historical Provider, MD  simethicone (MYLICON) 80 MG chewable tablet Chew 80 mg by mouth every 6 (six) hours as needed. For gas   Yes Historical Provider, MD  simvastatin (ZOCOR) 20 MG tablet Take 20 mg by mouth at bedtime.     Yes Historical Provider, MD  sodium chloride (OCEAN) 0.65 %  SOLN nasal spray Place 1 spray into the nose 4 (four) times daily.   Yes Historical Provider, MD  spironolactone (ALDACTONE) 25 MG tablet Take 25 mg by mouth 2 (two) times daily.     Yes Historical Provider, MD  torsemide (DEMADEX) 100 MG tablet Take 50 mg by mouth daily.     Yes Historical Provider, MD  trolamine salicylate (ASPERCREME) 10 % cream Apply 1 application topically 3 (three) times daily. To left shoulder   Yes Historical Provider, MD  warfarin (COUMADIN) 5 MG tablet Take 7.5-10 mg by mouth daily. Take 2 tablets (=10mg ) on Monday, Wednesday, and Friday.  Take 1.5 tablet (=7.5mg ) on Tuesday, Thursday, Saturday, and sunday   Yes Historical Provider, MD    FAMILY HISTORY: Family History  Problem Relation Age of Onset  . Colon cancer Neg Hx   . Prostate cancer Maternal Uncle   . Heart disease Paternal Uncle     x 3, paternal aunt    SOCIAL HISTORY:  reports that he quit smoking about 40 years ago. His smoking use included Cigarettes. He has quit using smokeless tobacco. His smokeless tobacco use included Chew. He reports that he does not drink alcohol or use illicit drugs.  REVIEW OF SYSTEMS:  Constitutional:   No  weight loss, night sweats,  Fevers,  chills, fatigue.  HEENT:    No headaches, Difficulty swallowing,Tooth/dental problems,Sore throat,  No sneezing, itching, ear ache, nasal congestion, post nasal drip,   Cardio-vascular:  Orthopnea, PND, swelling in lower extremities, anasarca,  GI:  No heartburn, abdominal pain, diarrhea, change in bowel habits, loss of appetite  Resp: No shortness of breath at rest.  No excess mucus, no productive cough, No non-productive cough,  No coughing up of blood.No change in color of mucus.No wheezing.No chest wall deformity  Skin:  no rash or lesions.  GU:  no dysuria, change in color of urine, no urgency or frequency.  No flank pain.  Musculoskeletal: No joint pain or swelling.  No decreased range of motion.  No back pain.  Psych: No change in mood or affect. No depression or anxiety.  No memory loss.   PHYSICAL EXAM: Blood pressure 122/52, pulse 35, temperature 98.3 F (36.8 C), temperature source Oral, resp. rate 15, SpO2 100.00%.  General appearance :Awake, alert, not in any distress. Speech Clear. Not toxic Looking HEENT: Atraumatic and Normocephalic, pupils equally reactive to light and accomodation Neck: supple, no JVD. No cervical lymphadenopathy.  Chest:Good air entry bilaterally, no added sounds  CVS: S1 S2 regular, no murmurs.  Abdomen: Bowel sounds present, Non tender and not distended with no gaurding, rigidity or rebound. Extremities: B/L Lower Ext shows no edema, both legs are warm to touch, with  dorsalis pedis pulses palpable. Neurology: Awake alert, and oriented X 3, CN II-XII intact, Non focal Skin:No Rash Wounds:N/A  LABS ON ADMISSION:   Basename 05/29/12 1254 05/29/12 1240  NA 137 135  K 5.8* 5.8*  CL 107 103  CO2 -- 22  GLUCOSE 302* 311*  BUN 19 18  CREATININE 1.60* 1.64*  CALCIUM -- 8.5  MG -- 2.2  PHOS -- --    Basename 05/29/12 1240  AST 45*  ALT 44  ALKPHOS 90  BILITOT 0.2*  PROT 6.7  ALBUMIN 3.1*   No results found for this  basename: LIPASE:2,AMYLASE:2 in the last 72 hours  Basename 05/29/12 1254 05/29/12 1240  WBC -- 8.0  NEUTROABS -- 6.2  HGB 11.6* 11.2*  HCT 34.0* 34.0*  MCV --  88.5  PLT -- 170    Basename 05/29/12 1240  CKTOTAL --  CKMB --  CKMBINDEX --  TROPONINI <0.30   No results found for this basename: DDIMER:2 in the last 72 hours No components found with this basename: POCBNP:3   RADIOLOGIC STUDIES ON ADMISSION: Dg Chest Port 1 View  05/29/2012  *RADIOLOGY REPORT*  Clinical Data: Bradycardia.  Diabetes.  Nausea.  PORTABLE CHEST - 1 VIEW  Comparison: 10/07/2010.  Findings: Low volume chest.  Apical lordotic projection. Defibrillator pads project over the chest.  Monitoring leads are also present on the chest.  Allowing for the low volumes of inspiration, the heart lungs appear within normal limits.  IMPRESSION: Low volume chest.   Original Report Authenticated By: Andreas Newport, M.D.    Dg Abd Portable 2v  05/29/2012  *RADIOLOGY REPORT*  Clinical Data: Chest pain.  Abdominal pain.  PORTABLE ABDOMEN - 2 VIEW  Comparison: 03/19/2008 CT.  Findings: Nonobstructive bowel gas pattern is present.  There is gaseous distention of colon in the left mid abdomen, likely within redundant sigmoid.  No gross plain film evidence of free air.  Prominent stool is present in the right colon.  Monitoring leads project over the lower chest along with a defibrillator pad.  Gas is present in the anatomic pelvis however the rectum is not visualized on these two views.  IMPRESSION: Gaseous distention of the colon.  Overall bowel gas pattern is nonobstructive.   Original Report Authenticated By: Andreas Newport, M.D.     ASSESSMENT AND PLAN: Present on Admission:  . Bradycardia with Junctional rhythm -Not sure whether this rhythm is associated with this amount of hyperkalemia -Currently his heart rate is in the mid 30s, he seems to be tolerating this pretty well and is hemodynamically stable and seems  asymptomatic. -Was admitted to the step down unit, hold his beta blockers, Aricept, attempt to correct his hyperkalemia. Monitor very closely. -If his heart rate were to decrease significantly further or he were to become symptomatic, he will require urgent cardiology re-evaluation. I have already spoken with PCCM, they will watch him from the box as well. -[facility] cardiology has already consulted on the patient  . Hyperkalemia  -Patient did receive IV insulin and Kayexalate in the emergency room, we will repeat chemistry later this evening.  -Etiology is perhaps related to acute renal failure   . Chest pain -Very vague and somewhat atypical -We'll cycle cardiac enzymes and place on aspirin  . Acute renal failure -Likely prerenal-patient on numerous diuretics. -Will hydrate cautiously and recheck electrolytes in the morning  . UTI -Continue with Rocephin, get urine culture  . DM -Continue with Lantus, place on SSI   . HYPERLIPIDEMIA -Continue with statin   . HYPERTENSION -BP currently well controlled, given bradycardia will hold beta blockers. Hold Cardura as well for now.   . ATRIAL FIBRILLATION, PAROXYSMAL -On chronic Coumadin therapy  -INR 2.19, would not start Coumadin for now, as may require pacemaker if his bradycardia persists   . GERD -Continue with PPI  Further plan will depend as patient's clinical course evolves and further radiologic and laboratory data become available. Patient will be monitored closely.  DVT Prophylaxis: -Not needed for now as INR is therapeutic. If Coumadin not resumed in the next few days and INR falls below 2, will need DVT prophylaxis initiated.  Code Status: -Full code  Total time spent for admission equals 45 minutes.  [facility] Pager [phone]  If 7PM-7AM, please contact night-coverage [email or web address]  Password [email or web address] 05/29/2012, 4:48 PM

## 2012-05-29 NOTE — ED Provider Notes (Signed)
History     CSN: [id number]  Arrival date & time 05/29/12  1220   First MD Initiated Contact with Patient 05/29/12 1231      Chief Complaint  Patient presents with  . Chest Pain    (Consider location/radiation/quality/duration/timing/severity/associated sxs/prior treatment) The history is provided by the patient.   patient presents with multiple complaints. He's had some chest pain. He's had some weakness and dizziness. Had some abdominal pain possibly distention. No nausea or vomiting. He has had some shortness of breath. No fevers. No cough. He was found to be bradycardic for EMS.  Past Medical History  Diagnosis Date  . Diabetes mellitus 1980  . Hyperlipidemia 1980  . Hypertension 1980  . Asthma   . Allergic rhinitis   . GERD (gastroesophageal reflux disease)   . BPH (benign prostatic hypertrophy)     s/p thermo therapy, has bladdero utlet obst. and instablitiy, incontient  . Anemia   . Chronic constipation   . Sick sinus syndrome   . Paroxysmal atrial fibrillation     status post direct cardioversion- normal cardiac cath 2003, with normal left ventricular function (done after an abnormal cardiolite)  . Bradycardia     severe, on high doese AV-nodal blockers  . Bifascicular block     chronic  . Obesity   . Sleep apnea     noncomplicance w/ CPAP  . Hypoxia     in the past, likely multifactorial  . Gastroparesis     dx in 2006 through gastric scan  . Gynecomastia     nomral mammograms 11/2008    Past Surgical History  Procedure Date  . Cataract extraction     bilateral  . Rotator cuff repair     left  . Cardiac catheterization 2003    normal, left ventricular function (done after an abnormal cardiolite)  . Tumor removal     off arms    Family History  Problem Relation Age of Onset  . Colon cancer Neg Hx   . Prostate cancer Maternal Uncle   . Heart disease Paternal Uncle     x 3, paternal aunt    History  Substance Use Topics  . Smoking status:  Former Smoker    Types: Cigarettes    Quit date: 07/25/1971  . Smokeless tobacco: Former Neurosurgeon    Types: Chew  . Alcohol Use: No      Review of Systems  Constitutional: Positive for fatigue. Negative for chills.  Eyes: Negative for pain.  Respiratory: Positive for chest tightness and shortness of breath.   Cardiovascular: Positive for chest pain and leg swelling.  Gastrointestinal: Positive for abdominal pain. Negative for nausea and vomiting.  Musculoskeletal: Negative for back pain.  Skin: Negative for rash.  Neurological: Negative for light-headedness.  Hematological: Negative for adenopathy.    Allergies  Review of patient's allergies indicates no known allergies.  Home Medications   Current Outpatient Rx  Name  Route  Sig  Dispense  Refill  . ACETAMINOPHEN 500 MG PO TABS   Oral   Take 1,000 mg by mouth every 6 (six) hours as needed. For severe headache         . ADVAIR DISKUS 100-50 MCG/DOSE IN AEPB      PROVIDE AND INHALE 1 PUFFBY MOUTH TWICEDAILY.RINSE MOUTH AND    SPIT AFTER EACH USE.   60 each   3     DUPLICATE, FILLED ON 04/26/11   . ALBUTEROL SULFATE HFA 108 (90 BASE) MCG/ACT IN AERS  Inhalation   Inhale 2 puffs into the lungs every 4 (four) hours as needed. For shortness of breath         . BEPOTASTINE BESILATE 1.5 % OP SOLN   Both Eyes   Place 1 drop into both eyes 2 (two) times daily.         Marland Kitchen CARVEDILOL 6.25 MG PO TABS   Oral   Take 6.25 mg by mouth 2 (two) times daily with a meal.           . CYCLOSPORINE 0.05 % OP EMUL   Both Eyes   Place 1 drop into both eyes 2 (two) times daily.          Marland Kitchen GLUCOSE 40 % PO GEL   Oral   Take 1 Tube by mouth once as needed. If cbg <60         . DONEPEZIL HCL 10 MG PO TABS   Oral   Take 10 mg by mouth at bedtime.           Marland Kitchen DOXAZOSIN MESYLATE 4 MG PO TABS   Oral   Take 4 mg by mouth at bedtime.           . ERGOCALCIFEROL 50000 UNITS PO CAPS      1 by mouth every Friday.            Marland Kitchen FAMOTIDINE 40 MG PO TABS   Oral   Take 40 mg by mouth at bedtime.         Marland Kitchen FERROUS SULFATE 325 (65 FE) MG PO TABS   Oral   Take 325 mg by mouth daily with breakfast.           . FEXOFENADINE HCL 180 MG PO TABS   Oral   Take 180 mg by mouth daily as needed. For seasonal allergies         . HYPROMELLOSE 2.5 % OP SOLN      Apply to both eyes as frequent as every 2 hours prn for dry eye          . INSULIN ASPART 100 UNIT/ML [place] SOLN   Subcutaneous   Inject 5-24 Units into the skin 2 (two) times daily with breakfast and lunch. Sliding scale:  110-150= 5 units; 151-22= 8 units; 201-250= 12 units; 251-300= 16 units; 301-350= 18 units; 351-400= 22 units; if >567 administer 24 units         . INSULIN GLARGINE 100 UNIT/ML  SOLN   Subcutaneous   Inject 39 Units into the skin at bedtime.         . INSULIN SYRINGE 29G X 1/2" 1 ML MISC      USE AS DIRECTED.   100 each   2   . IPRATROPIUM BROMIDE 0.06 % NA SOLN   Nasal   Place 2 sprays into the nose 3 (three) times daily.         Marland Kitchen METFORMIN HCL ER 750 MG PO TB24   Oral   Take 750 mg by mouth daily with breakfast.           . METOCLOPRAMIDE HCL 10 MG PO TABS   Oral   Take 0.5 tablets (5 mg total) by mouth at bedtime.   30 tablet   11   . MULTIVITAMINS PO TABS   Oral   Take 1 tablet by mouth daily.           Marland Kitchen BACITRACIN-NEOMYCIN-POLYMYXIN OINTMENT TUBE   Topical  Apply 1 application topically daily. To left big toe         . PANTOPRAZOLE SODIUM 40 MG PO TBEC   Oral   Take 40 mg by mouth 2 (two) times daily.          Marland Kitchen POLYETHYLENE GLYCOL 3350 PO POWD   Oral   Take 17 g by mouth daily as needed. For constipation         . SENNA-DOCUSATE SODIUM 8.6-50 MG PO TABS   Oral   Take 2 tablets by mouth at bedtime as needed. For constipation         . SERTRALINE HCL 25 MG PO TABS   Oral   Take 25 mg by mouth daily.           Marland Kitchen SIMETHICONE 80 MG PO CHEW   Oral   Chew 80 mg by mouth every 6  (six) hours as needed. For gas         . SIMVASTATIN 20 MG PO TABS   Oral   Take 20 mg by mouth at bedtime.           Marland Kitchen SALINE 0.65 % NA SOLN   Nasal   Place 1 spray into the nose 4 (four) times daily.         Marland Kitchen SPIRONOLACTONE 25 MG PO TABS   Oral   Take 25 mg by mouth 2 (two) times daily.           . TORSEMIDE 100 MG PO TABS   Oral   Take 50 mg by mouth daily.           . TROLAMINE SALICYLATE 10 % EX CREA   Topical   Apply 1 application topically 3 (three) times daily. To left shoulder         . WARFARIN SODIUM 5 MG PO TABS   Oral   Take 7.5-10 mg by mouth daily. Take 2 tablets (=10mg ) on Monday, Wednesday, and Friday.  Take 1.5 tablet (=7.5mg ) on Tuesday, Thursday, Saturday, and sunday           BP 121/52  Pulse 35  Temp 98.3 F (36.8 C) (Oral)  Resp 23  SpO2 100%  Physical Exam  Nursing note and vitals reviewed. Constitutional: He is oriented to person, place, and time. He appears well-developed and well-nourished.  HENT:  Head: Normocephalic and atraumatic.  Eyes: EOM are normal. Pupils are equal, round, and reactive to light.  Neck: Normal range of motion. Neck supple.  Cardiovascular: Normal heart sounds.   No murmur heard.      Bradycardia.  Pulmonary/Chest: Effort normal and breath sounds normal.  Abdominal: Soft. Bowel sounds are normal. He exhibits distension. He exhibits no mass. There is tenderness. There is no rebound and no guarding.       Minimal tenderness. Mild distension. No rebound or guarding.   Musculoskeletal: Normal range of motion. He exhibits no edema.  Neurological: He is alert and oriented to person, place, and time. No cranial nerve deficit.  Skin: Skin is warm and dry.  Psychiatric: He has a normal mood and affect.    ED Course  Procedures (including critical care time)  Labs Reviewed  CBC WITH DIFFERENTIAL - Abnormal; Notable for the following:    RBC 3.84 (*)     Hemoglobin 11.2 (*)     HCT 34.0 (*)      Neutrophils Relative 78 (*)     All other components within normal limits  COMPREHENSIVE  METABOLIC PANEL - Abnormal; Notable for the following:    Potassium 5.8 (*)     Glucose, Bld 311 (*)     Creatinine, Ser 1.64 (*)     Albumin 3.1 (*)     AST 45 (*)     Total Bilirubin 0.2 (*)     GFR calc non Af Amer 39 (*)     GFR calc Af Amer 45 (*)     All other components within normal limits  PROTIME-INR - Abnormal; Notable for the following:    Prothrombin Time 23.4 (*)     INR 2.19 (*)     All other components within normal limits  POCT I-STAT, CHEM 8 - Abnormal; Notable for the following:    Potassium 5.8 (*)     Creatinine, Ser 1.60 (*)     Glucose, Bld 302 (*)     Hemoglobin 11.6 (*)     HCT 34.0 (*)     All other components within normal limits  MAGNESIUM  TROPONIN I  URINALYSIS, ROUTINE W REFLEX MICROSCOPIC   Dg Chest Port 1 View  05/29/2012  *RADIOLOGY REPORT*  Clinical Data: Bradycardia.  Diabetes.  Nausea.  PORTABLE CHEST - 1 VIEW  Comparison: 10/07/2010.  Findings: Low volume chest.  Apical lordotic projection. Defibrillator pads project over the chest.  Monitoring leads are also present on the chest.  Allowing for the low volumes of inspiration, the heart lungs appear within normal limits.  IMPRESSION: Low volume chest.   Original Report Authenticated By: Andreas Newport, M.D.    Dg Abd Portable 2v  05/29/2012  *RADIOLOGY REPORT*  Clinical Data: Chest pain.  Abdominal pain.  PORTABLE ABDOMEN - 2 VIEW  Comparison: 03/19/2008 CT.  Findings: Nonobstructive bowel gas pattern is present.  There is gaseous distention of colon in the left mid abdomen, likely within redundant sigmoid.  No gross plain film evidence of free air.  Prominent stool is present in the right colon.  Monitoring leads project over the lower chest along with a defibrillator pad.  Gas is present in the anatomic pelvis however the rectum is not visualized on these two views.  IMPRESSION: Gaseous distention of the  colon.  Overall bowel gas pattern is nonobstructive.   Original Report Authenticated By: Andreas Newport, M.D.      1. Bradycardia   2. Junctional rhythm     Date: 05/29/2012  Rate: 36  Rhythm: atrial fibrillation  QRS Axis: left  Intervals: afib  ST/T Wave abnormalities: nonspecific T wave changes  Conduction Disutrbances:right bundle branch block  Narrative Interpretation: new bradycardia. Mildly peaked t waves in V3.  Old EKG Reviewed: changes noted  CRITICAL CARE Performed by: Billee Cashing.   Total critical care time: 30  Critical care time was exclusive of separately billable procedures and treating other patients.  Critical care was necessary to treat or prevent imminent or life-threatening deterioration.  Critical care was time spent personally by me on the following activities: development of treatment plan with patient and/or surrogate as well as nursing, discussions with consultants, evaluation of patient's response to treatment, examination of patient, obtaining history from patient or surrogate, ordering and performing treatments and interventions, ordering and review of laboratory studies, ordering and review of radiographic studies, pulse oximetry and re-evaluation of patient's condition.   MDM  Patient presents with bradycardia. His history of A. fib bradycardia and sick sinus syndrome. Heart rate is in the 30s placement and his blood pressure. He is on beta blocker. He also  has a mild hypokalemia with potassium of 5.8. Creatinine is 1.6 which is near his baseline. Glucose is also mildly elevated at 300. His been given IV insulin to help shift her potassium. His been seen by cardiology which recommended admission and monitoring. He may end up getting a pacemaker. He'll be admitted to medicine. Also found to have a urinary tract infection. Started on Rocephin and cultures.       Juliet Rude. Rubin Payor, MD 05/29/12 [phone]

## 2012-05-29 NOTE — ED Notes (Signed)
Family at bedside. 

## 2012-05-29 NOTE — ED Notes (Signed)
Carb Mod Diet tray ordered spoke with Adela Lank

## 2012-05-29 NOTE — ED Notes (Signed)
Snacks provided to pt as he awaits the meal tray that has been ordered.

## 2012-05-29 NOTE — ED Notes (Signed)
MD to bedside on pt's arrival, pt placed on Zoll pads during triage

## 2012-05-29 NOTE — Consult Note (Signed)
Consult Note  Patient ID: Tyler [name] MRN: [id number], SOB: 04/07/1934 78 y.o. Date of Encounter: 05/29/2012, 1:49 PM  Primary Physician: [name], MD Primary Cardiologist: Dr. Gala Romney in 2008  Chief Complaint: headache, abd pain, chest pain  HPI: 78 y.o. male w/ PMHx significant for Normal coronaries by cath 2003, Chronic Diastolic CHF (EF 78-29% 2008), PAF (s/p DCCV, on coumadin), Sick Sinus Syndrome, bifasicular AV block, HTN, HLD, DMII, GERD, and OSA/OHS (noncompliant w/ CPAP) who was transferred from his nursing facility to [facility] on 05/29/2012 for chest pain and bradycardia.  Reportedly had a cath in 2003 with normal coronary arteries. Has a history of diastolic CHF with last echo 2008 showing EF 55-60%, no WMAs, mild R/LAE, mild RV dilation. Was last seen by Dr. Gala Romney in 2008 with no cardiology follow up since that time. He has lived in a SNF for ~5 years. He was sent to the ED today for complaints of headache, chest pain, abdominal pain, and elevated blood sugars. During transport EMS noted his heart rate was in the 30-40s. He reports having a pinching sensation in his left chest that started a few days ago, is intermittent, without radiation, and associated with sob, nausea, and diaphoresis. Not pleuritic or worse with movement. He is not very active, but is able to get around with a walker. No orthopnea. Mild LE edema. Reports lightheadedness with ambulation in the last couple of days. No syncope or falls. Has diffuse abdominal pain that has been intermittent over the last year. No melena/hematochezia.  EKG shows Junctional rhythm 36bpm, RBBB, LAFB, inferior TWI. Telemetry shows junctional rhythm 30-50s, no pauses or arrhythmias. CXR is without acute cardiopulmonary abnormalities. Abd Xray shows gaseous distention of the colon without obstruction. Labs are significant for K+ 5.8, Crt 1.6, glucose 302, hgb 11.2, INR 2.19. He received 6units IV  insulin.   Past Medical History  Diagnosis Date  . Diabetes mellitus 1980  . Hyperlipidemia 1980  . Hypertension 1980  . Asthma   . Allergic rhinitis   . GERD (gastroesophageal reflux disease)   . BPH (benign prostatic hypertrophy)     s/p thermo therapy, has bladdero utlet obst. and instablitiy, incontient  . Anemia   . Chronic constipation   . Sick sinus syndrome   . Paroxysmal atrial fibrillation     status post direct cardioversion- normal cardiac cath 2003, with normal left ventricular function (done after an abnormal cardiolite)  . Bradycardia     severe, on high doese AV-nodal blockers  . Bifascicular block     chronic  . Obesity   . Sleep apnea     noncomplicance w/ CPAP  . Hypoxia     in the past, likely multifactorial  . Gastroparesis     dx in 2006 through gastric scan  . Gynecomastia     nomral mammograms 11/2008    Echo 2008 SUMMARY - Overall left ventricular systolic function was normal. Left ventricular ejection fraction was estimated , range being 55 % to 60 %. There was no diagnostic evidence of left ventricular regional wall motion abnormalities. Left ventricular wall thickness was mildly increased.  - The left atrium was mildly dilated. - The right ventricle was mildly dilated. Right ventricular systolic function was mildly reduced. - The right atrium was mildly dilated.   Surgical History:  Past Surgical History  Procedure Date  . Cataract extraction     bilateral  . Rotator cuff repair     left  . Cardiac  catheterization 2003    normal, left ventricular function (done after an abnormal cardiolite)  . Tumor removal     off arms     Home Meds: Medication Sig  acetaminophen (TYLENOL) 160 MG/5ML elixir Take 10 mg/kg by mouth every 4 (four) hours as needed.    ADVAIR DISKUS 100-50 MCG/DOSE AEPB PROVIDE AND INHALE 1 PUFFBY MOUTH TWICEDAILY.RINSE MOUTH AND    SPIT AFTER EACH USE.  albuterol (VENTOLIN HFA) 108 (90 BASE) MCG/ACT inhaler Inhale 2  puffs into the lungs every 4 (four) hours as needed.    bismuth subsalicylate (PEPTO-BISMOL) 262 MG/15ML suspension Take 15 mLs by mouth as needed.    carvedilol (COREG) 6.25 MG tablet Take 6.25 mg by mouth 2 (two) times daily with a meal.    cefdinir (OMNICEF) 300 MG capsule Take 300 mg by mouth 2 (two) times daily.  cycloSPORINE (RESTASIS) 0.05 % ophthalmic emulsion 1 drop 2 (two) times daily.    dextrose (GLUTOSE) 40 % GEL Take 1 Tube by mouth once. If cbg <60   donepezil (ARICEPT) 10 MG tablet Take 10 mg by mouth at bedtime.    doxazosin (CARDURA) 4 MG tablet Take 4 mg by mouth at bedtime.    ergocalciferol (VITAMIN D2) 50000 UNITS capsule 1 by mouth every Friday.   ferrous sulfate 325 (65 FE) MG tablet Take 325 mg by mouth daily with breakfast.    fexofenadine (ALLEGRA) 60 MG tablet Take 1 tablet (60 mg total) by mouth daily.  finasteride (PROSCAR) 5 MG tablet Take 5 mg by mouth daily.    guaiFENesin (ROBITUSSIN) 100 MG/5ML liquid Take 200 mg by mouth 3 (three) times daily as needed.    hydroxypropyl methylcellulose (ISOPTO TEARS) 2.5 % ophthalmic solution Apply to both eyes as frequent as every 2 hours prn for dry eye   INSULIN SYRINGE 1CC/29G (SAFETY-LOK INSULIN SYR 1CC/29G) 29G X 1/2" 1 ML MISC USE AS DIRECTED.  metFORMIN (GLUCOPHAGE-XR) 750 MG 24 hr tablet Take 750 mg by mouth daily with breakfast.    metoCLOPramide (REGLAN) 10 MG tablet Take 0.5 tablets (5 mg total) by mouth at bedtime.  mometasone (NASONEX) 50 MCG/ACT nasal spray Place 2 sprays into the nose daily.  multivitamin (THERAGRAN) per tablet Take 1 tablet by mouth daily.    MYLANTA GAS 80 MG chewable tablet CHEW 1 TABLET BY MOUTH 4 TIMES DAILY.  pantoprazole (PROTONIX) 40 MG tablet Take 40 mg by mouth daily.    Polyethyl Glycol-Propyl Glycol (SYSTANE OP) Apply to eye.    polyethylene glycol powder (MIRALAX) powder Take 17 g by mouth daily as needed.    sennosides-docusate sodium (SENOKOT-S) 8.6-50 MG tablet Take 2 tablets  by mouth at bedtime.    sertraline (ZOLOFT) 25 MG tablet Take 25 mg by mouth daily.    simvastatin (ZOCOR) 20 MG tablet Take 20 mg by mouth at bedtime.    spironolactone (ALDACTONE) 25 MG tablet Take 25 mg by mouth 2 (two) times daily.    torsemide (DEMADEX) 100 MG tablet Take 50 mg by mouth daily.    warfarin (COUMADIN) 7.5 MG tablet Take 1.5 tabs once a day    Allergies: No Known Allergies  History   Social History  . Marital Status: Widowed    Spouse Name: N/A    Number of Children: 2  . Years of Education: N/A   Occupational History  . retired    Social History Main Topics  . Smoking status: Former Smoker    Types: Cigarettes  Quit date: 07/25/1971  . Smokeless tobacco: Former Neurosurgeon    Types: Chew  . Alcohol Use: No  . Drug Use: No  . Sexually Active: Not on file   Other Topics Concern  . Not on file   Social History Narrative   Has lived in facility x aprox 4 years[place] 2[place]2010Does not have a POADoes not have a living will     Family History  Problem Relation Age of Onset  . Colon cancer Neg Hx   . Prostate cancer Maternal Uncle   . Heart disease Paternal Uncle     x 3, paternal aunt    Review of Systems: General: negative for chills, fever, night sweats or weight changes.  Cardiovascular: negative for chest pain, shortness of breath, dyspnea on exertion, edema, orthopnea, palpitations, paroxysmal nocturnal dyspnea  Dermatological: negative for rash Respiratory: negative for cough or wheezing Urologic: negative for hematuria Abdominal: negative for nausea, vomiting, diarrhea, bright red blood per rectum, melena, or hematemesis Neurologic: negative for visual changes, syncope, or dizziness All other systems reviewed and are otherwise negative except as noted above.  Labs:   05/29/2012 12:54  TCO2 21  Sodium 137  Potassium 5.8 (H)  Chloride 107  BUN 19  Creatinine 1.60 (H)  Glucose 302 (H)  Calcium Ionized 1.13    05/29/2012 12:40  WBC  8.0  RBC 3.84 (L)  Hemoglobin 11.2 (L)  HCT 34.0 (L)  MCV 88.5  MCHC 32.9  RDW 12.7  Platelets 170     05/29/2012 12:40  Prothrombin Time 23.4 (H)  INR 2.19 (H)     05/29/2012 12:40  Troponin I <0.30    Radiology/Studies:   05/29/2012  -  PORTABLE CHEST - 1 VIEW  Findings: Low volume chest.  Apical lordotic projection. Defibrillator pads project over the chest.  Monitoring leads are also present on the chest.  Allowing for the low volumes of inspiration, the heart lungs appear within normal limits.  IMPRESSION: Low volume chest.    05/29/2012 - PORTABLE ABDOMEN - 2 VIEW    Findings: Nonobstructive bowel gas pattern is present.  There is gaseous distention of colon in the left mid abdomen, likely within redundant sigmoid.  No gross plain film evidence of free air.  Prominent stool is present in the right colon.  Monitoring leads project over the lower chest along with a defibrillator pad.  Gas is present in the anatomic pelvis however the rectum is not visualized on these two views.  IMPRESSION: Gaseous distention of the colon.  Overall bowel gas pattern is nonobstructive.        EKG: 05/29/12 @ 1230 - Junctional rhythm 36bpm, RBBB, LAFB, inferior TWI  Physical Exam: Blood pressure 142/98, pulse 40, temperature 98.2 F (36.8 C), temperature source Oral, resp. rate 18, SpO2 100.00%. General: Obese chronically ill appearing elderly male in no acute distress. Head: Normocephalic, atraumatic, sclera non-icteric, nares are without discharge Neck: Supple. Negative for carotid bruits. JVP difficult to assess due to body habitus. Lungs: Clear bilaterally to auscultation without wheezes, rales, or rhonchi. Breathing is unlabored. Heart: bradycardic with S1 S2. No murmurs, rubs, or gallops appreciated. Abdomen: Obese, Soft, diffusely tender, distended with normoactive bowel sounds. No rebound/guarding. No obvious abdominal masses. Msk:  Strength and tone appear normal for age. Extremities:  Trace bilat ankle edema. No clubbing or cyanosis. Distal pedal pulses are intact and equal bilaterally. Neuro: Alert and oriented X 3. Moves all extremities spontaneously. Psych:  Responds to questions appropriately with a  normal affect.    ASSESSMENT AND PLAN:   78 y.o. male w/ PMHx significant for Normal coronaries by cath 2003, Chronic Diastolic CHF (EF 16-10% 2008), PAF (s/p DCCV, on coumadin), Sick Sinus Syndrome, bifasicular AV block, HTN, HLD, DMII, GERD, and OSA/OHS (noncompliant w/ CPAP) who was transferred from his nursing facility to [facility] on 05/29/2012 for chest pain and bradycardia.  1. Chest pain with normal coronaries by cath 2003 2. Symptomatic bradycardia with h/o sick sinus syndrome 3. PAF on chronic coumadin 4. Hyperkalemia 5. Diabetes Mellitus, Type 2 Uncontrolled with Hyperglycemia 6. Chronic Kidney Disease 7. Paroxysmal AFL 8. Morbid obesity 9. Bifascicular block 10. OSA/OHS   Signed, HOPE, JESSICA PA-C 05/29/2012, 1:49 PM   Patient seen and examined with [facility], PA-C. We discussed all aspects of the encounter. I agree with the assessment and plan as stated above.   Patient with significant bradycardia/junctional rhythm in the setting of hyperkalemia, low-dose beta-blockade and bifasicular block.  Will ask [facility] to admit him due to multiple medical issues. Will hold b-blocker and fix hyperkalemia. If bradycardia persists will need PPM. Will discuss with EP.  Agree with Zoll pads. Place in SDU.   Truman Hayward 2:32 PM

## 2012-05-29 NOTE — ED Notes (Addendum)
To ED from assisted living, staff reported CBG 48 this AM, after eating was in the 400s, CBG 398 with EMS, also c/o left sided CP with HR in 30s, CP has resolved on arrival, pt remains SOB and bradycardic, 20g right hand, no pain, c/o weakness, nausea, EMS gave 324 ASA on arrival

## 2012-05-30 ENCOUNTER — Inpatient Hospital Stay (HOSPITAL_COMMUNITY): Payer: Medicare Other

## 2012-05-30 DIAGNOSIS — I4891 Unspecified atrial fibrillation: Secondary | ICD-10-CM

## 2012-05-30 DIAGNOSIS — K5909 Other constipation: Secondary | ICD-10-CM | POA: Diagnosis present

## 2012-05-30 DIAGNOSIS — E860 Dehydration: Secondary | ICD-10-CM | POA: Diagnosis present

## 2012-05-30 DIAGNOSIS — Z7901 Long term (current) use of anticoagulants: Secondary | ICD-10-CM

## 2012-05-30 DIAGNOSIS — J209 Acute bronchitis, unspecified: Secondary | ICD-10-CM

## 2012-05-30 DIAGNOSIS — I517 Cardiomegaly: Secondary | ICD-10-CM

## 2012-05-30 DIAGNOSIS — G4733 Obstructive sleep apnea (adult) (pediatric): Secondary | ICD-10-CM | POA: Diagnosis present

## 2012-05-30 DIAGNOSIS — N289 Disorder of kidney and ureter, unspecified: Secondary | ICD-10-CM

## 2012-05-30 DIAGNOSIS — E119 Type 2 diabetes mellitus without complications: Secondary | ICD-10-CM | POA: Diagnosis present

## 2012-05-30 DIAGNOSIS — E669 Obesity, unspecified: Secondary | ICD-10-CM | POA: Diagnosis present

## 2012-05-30 DIAGNOSIS — D649 Anemia, unspecified: Secondary | ICD-10-CM

## 2012-05-30 LAB — CK TOTAL AND CKMB (NOT AT ARMC)
CK, MB: 1.8 ng/mL (ref 0.3–4.0)
Relative Index: INVALID (ref 0.0–2.5)

## 2012-05-30 LAB — COMPREHENSIVE METABOLIC PANEL
ALT: 34 U/L (ref 0–53)
Alkaline Phosphatase: 76 U/L (ref 39–117)
BUN: 23 mg/dL (ref 6–23)
CO2: 23 mEq/L (ref 19–32)
GFR calc Af Amer: 37 mL/min — ABNORMAL LOW (ref >90)
GFR calc non Af Amer: 32 mL/min — ABNORMAL LOW (ref >90)
Glucose, Bld: 232 mg/dL — ABNORMAL HIGH (ref 70–99)
Potassium: 5.2 mEq/L — ABNORMAL HIGH (ref 3.5–5.1)
Sodium: 135 mEq/L (ref 135–145)

## 2012-05-30 LAB — BASIC METABOLIC PANEL
CO2: 24 mEq/L (ref 19–32)
Chloride: 102 mEq/L (ref 96–112)
GFR calc non Af Amer: 42 mL/min — ABNORMAL LOW (ref >90)
Glucose, Bld: 194 mg/dL — ABNORMAL HIGH (ref 70–99)
Potassium: 4.5 mEq/L (ref 3.5–5.1)
Sodium: 136 mEq/L (ref 135–145)

## 2012-05-30 LAB — CBC
HCT: 29.4 % — ABNORMAL LOW (ref 39.0–52.0)
Hemoglobin: 9.9 g/dL — ABNORMAL LOW (ref 13.0–17.0)
MCH: 29.8 pg (ref 26.0–34.0)
RBC: 3.32 MIL/uL — ABNORMAL LOW (ref 4.22–5.81)

## 2012-05-30 LAB — PROTIME-INR: Prothrombin Time: 24.1 seconds — ABNORMAL HIGH (ref 11.6–15.2)

## 2012-05-30 LAB — GLUCOSE, CAPILLARY
Glucose-Capillary: 163 mg/dL — ABNORMAL HIGH (ref 70–99)
Glucose-Capillary: 169 mg/dL — ABNORMAL HIGH (ref 70–99)

## 2012-05-30 MED ORDER — ACETAMINOPHEN 325 MG PO TABS
650.0000 mg | ORAL_TABLET | Freq: Four times a day (QID) | ORAL | Status: DC | PRN
  Administered 2012-05-30: 650 mg via ORAL
  Filled 2012-05-30: qty 2

## 2012-05-30 MED ORDER — PERFLUTREN LIPID MICROSPHERE
1.0000 mL | INTRAVENOUS | Status: AC | PRN
  Administered 2012-05-30 (×2): 2 mL via INTRAVENOUS
  Filled 2012-05-30: qty 10

## 2012-05-30 MED ORDER — HYDRALAZINE HCL 20 MG/ML IJ SOLN
10.0000 mg | Freq: Four times a day (QID) | INTRAMUSCULAR | Status: DC
  Administered 2012-05-30 – 2012-06-02 (×14): 10 mg via INTRAVENOUS
  Administered 2012-06-03: 11:00:00 via INTRAVENOUS
  Administered 2012-06-03: 10 mg via INTRAVENOUS
  Filled 2012-05-30: qty 0.5
  Filled 2012-05-30 (×2): qty 1
  Filled 2012-05-30 (×2): qty 0.5
  Filled 2012-05-30: qty 1
  Filled 2012-05-30 (×13): qty 0.5

## 2012-05-30 MED ORDER — PERFLUTREN LIPID MICROSPHERE
INTRAVENOUS | Status: AC
  Filled 2012-05-30: qty 10

## 2012-05-30 MED ORDER — POLYETHYLENE GLYCOL 3350 17 G PO PACK
17.0000 g | PACK | Freq: Every day | ORAL | Status: DC
  Administered 2012-05-30 – 2012-06-02 (×4): 17 g via ORAL
  Filled 2012-05-30 (×5): qty 1

## 2012-05-30 NOTE — Progress Notes (Signed)
Echocardiogram 2D Echocardiogram with Definity has been performed.  Tyler [name] 05/30/2012, 4:12 PM

## 2012-05-30 NOTE — Progress Notes (Signed)
Subjective: No CP or SOB. Objective: Filed Vitals:   05/30/12 0600 05/30/12 0700 05/30/12 0811 05/30/12 0835  BP: 137/46 131/57    Pulse: 78  78   Temp:   98.2 F (36.8 C)   TempSrc:   Oral   Resp: 25     Height:      Weight:      SpO2: 100% 100%  100%   Weight change:   Intake/Output Summary (Last 24 hours) at 05/30/12 1034 Last data filed at 05/30/12 1032  Gross per 24 hour  Intake    633 ml  Output    400 ml  Net    233 ml    General: Alert, awake, oriented x3, in no acute distress Neck:  JVP is difficult to assess Heart: Regular rate and rhythm, without murmurs, rubs, gallops.  Lungs: Clear to auscultation.  No rales or wheezes. Exemities:  No edema.   Neuro: Grossly intact, nonfocal.  Tele:  ? SR and some AFIB  Difficult to see pwaves.  HR 70s. Lab Results: Results for orders placed during the hospital encounter of 05/29/12 (from the past 24 hour(s))  CBC WITH DIFFERENTIAL     Status: Abnormal   Collection Time   05/29/12 12:40 PM      Component Value Range   WBC 8.0  4.0 - 10.5 K/uL   RBC 3.84 (*) 4.22 - 5.81 MIL/uL   Hemoglobin 11.2 (*) 13.0 - 17.0 g/dL   HCT 16.1 (*) 09.6 - 04.5 %   MCV 88.5  78.0 - 100.0 fL   MCH 29.2  26.0 - 34.0 pg   MCHC 32.9  30.0 - 36.0 g/dL   RDW 40.9  81.1 - 91.4 %   Platelets 170  150 - 400 K/uL   Neutrophils Relative 78 (*) 43 - 77 %   Neutro Abs 6.2  1.7 - 7.7 K/uL   Lymphocytes Relative 13  12 - 46 %   Lymphs Abs 1.1  0.7 - 4.0 K/uL   Monocytes Relative 5  3 - 12 %   Monocytes Absolute 0.4  0.1 - 1.0 K/uL   Eosinophils Relative 3  0 - 5 %   Eosinophils Absolute 0.3  0.0 - 0.7 K/uL   Basophils Relative 0  0 - 1 %   Basophils Absolute 0.0  0.0 - 0.1 K/uL  MAGNESIUM     Status: Normal   Collection Time   05/29/12 12:40 PM      Component Value Range   Magnesium 2.2  1.5 - 2.5 mg/dL  COMPREHENSIVE METABOLIC PANEL     Status: Abnormal   Collection Time   05/29/12 12:40 PM      Component Value Range   Sodium 135  135 - 145  mEq/L   Potassium 5.8 (*) 3.5 - 5.1 mEq/L   Chloride 103  96 - 112 mEq/L   CO2 22  19 - 32 mEq/L   Glucose, Bld 311 (*) 70 - 99 mg/dL   BUN 18  6 - 23 mg/dL   Creatinine, Ser 7.82 (*) 0.50 - 1.35 mg/dL   Calcium 8.5  8.4 - 95.6 mg/dL   Total Protein 6.7  6.0 - 8.3 g/dL   Albumin 3.1 (*) 3.5 - 5.2 g/dL   AST 45 (*) 0 - 37 U/L   ALT 44  0 - 53 U/L   Alkaline Phosphatase 90  39 - 117 U/L   Total Bilirubin 0.2 (*) 0.3 - 1.2 mg/dL   GFR  calc non Af Amer 39 (*) >90 mL/min   GFR calc Af Amer 45 (*) >90 mL/min  TROPONIN I     Status: Normal   Collection Time   05/29/12 12:40 PM      Component Value Range   Troponin I <0.30  <0.30 ng/mL  PROTIME-INR     Status: Abnormal   Collection Time   05/29/12 12:40 PM      Component Value Range   Prothrombin Time 23.4 (*) 11.6 - 15.2 seconds   INR 2.19 (*) 0.00 - 1.49  POCT I-STAT, CHEM 8     Status: Abnormal   Collection Time   05/29/12 12:54 PM      Component Value Range   Sodium 137  135 - 145 mEq/L   Potassium 5.8 (*) 3.5 - 5.1 mEq/L   Chloride 107  96 - 112 mEq/L   BUN 19  6 - 23 mg/dL   Creatinine, Ser 4.09 (*) 0.50 - 1.35 mg/dL   Glucose, Bld 811 (*) 70 - 99 mg/dL   Calcium, Ion 9.14  7.82 - 1.30 mmol/L   TCO2 21  0 - 100 mmol/L   Hemoglobin 11.6 (*) 13.0 - 17.0 g/dL   HCT 95.6 (*) 21.3 - 08.6 %  URINALYSIS, ROUTINE W REFLEX MICROSCOPIC     Status: Abnormal   Collection Time   05/29/12  2:38 PM      Component Value Range   Color, Urine YELLOW  YELLOW   APPearance TURBID (*) CLEAR   Specific Gravity, Urine 1.017  1.005 - 1.030   pH 5.0  5.0 - 8.0   Glucose, UA 100 (*) NEGATIVE mg/dL   Hgb urine dipstick MODERATE (*) NEGATIVE   Bilirubin Urine NEGATIVE  NEGATIVE   Ketones, ur NEGATIVE  NEGATIVE mg/dL   Protein, ur 578 (*) NEGATIVE mg/dL   Urobilinogen, UA 1.0  0.0 - 1.0 mg/dL   Nitrite NEGATIVE  NEGATIVE   Leukocytes, UA LARGE (*) NEGATIVE  URINE MICROSCOPIC-ADD ON     Status: Abnormal   Collection Time   05/29/12  2:38 PM       Component Value Range   Squamous Epithelial / LPF FEW (*) RARE   WBC, UA TOO NUMEROUS TO COUNT  <3 WBC/hpf   RBC / HPF 0-2  <3 RBC/hpf   Bacteria, UA MANY (*) RARE  GLUCOSE, CAPILLARY     Status: Abnormal   Collection Time   05/29/12  3:20 PM      Component Value Range   Glucose-Capillary 156 (*) 70 - 99 mg/dL   Comment 1 Documented in Chart     Comment 2 Notify RN    GLUCOSE, CAPILLARY     Status: Abnormal   Collection Time   05/29/12  5:33 PM      Component Value Range   Glucose-Capillary 127 (*) 70 - 99 mg/dL  MRSA PCR SCREENING     Status: Normal   Collection Time   05/29/12  5:38 PM      Component Value Range   MRSA by PCR NEGATIVE  NEGATIVE  BASIC METABOLIC PANEL     Status: Abnormal   Collection Time   05/29/12  6:17 PM      Component Value Range   Sodium 136  135 - 145 mEq/L   Potassium 5.1  3.5 - 5.1 mEq/L   Chloride 105  96 - 112 mEq/L   CO2 22  19 - 32 mEq/L   Glucose, Bld 125 (*) 70 -  99 mg/dL   BUN 20  6 - 23 mg/dL   Creatinine, Ser 4.09 (*) 0.50 - 1.35 mg/dL   Calcium 8.6  8.4 - 81.1 mg/dL   GFR calc non Af Amer 34 (*) >90 mL/min   GFR calc Af Amer 40 (*) >90 mL/min  TROPONIN I     Status: Normal   Collection Time   05/29/12  6:18 PM      Component Value Range   Troponin I <0.30  <0.30 ng/mL  CK TOTAL AND CKMB     Status: Normal   Collection Time   05/29/12  6:18 PM      Component Value Range   Total CK 65  7 - 232 U/L   CK, MB 2.0  0.3 - 4.0 ng/mL   Relative Index RELATIVE INDEX IS INVALID  0.0 - 2.5  GLUCOSE, CAPILLARY     Status: Abnormal   Collection Time   05/29/12  9:28 PM      Component Value Range   Glucose-Capillary 223 (*) 70 - 99 mg/dL  TROPONIN I     Status: Normal   Collection Time   05/30/12 12:08 AM      Component Value Range   Troponin I <0.30  <0.30 ng/mL  CK TOTAL AND CKMB     Status: Normal   Collection Time   05/30/12 12:08 AM      Component Value Range   Total CK 57  7 - 232 U/L   CK, MB 1.8  0.3 - 4.0 ng/mL   Relative Index  RELATIVE INDEX IS INVALID  0.0 - 2.5  CBC     Status: Abnormal   Collection Time   05/30/12  1:25 AM      Component Value Range   WBC 7.9  4.0 - 10.5 K/uL   RBC 3.32 (*) 4.22 - 5.81 MIL/uL   Hemoglobin 9.9 (*) 13.0 - 17.0 g/dL   HCT 91.4 (*) 78.2 - 95.6 %   MCV 88.6  78.0 - 100.0 fL   MCH 29.8  26.0 - 34.0 pg   MCHC 33.7  30.0 - 36.0 g/dL   RDW 21.3  08.6 - 57.8 %   Platelets 154  150 - 400 K/uL  COMPREHENSIVE METABOLIC PANEL     Status: Abnormal   Collection Time   05/30/12  1:25 AM      Component Value Range   Sodium 135  135 - 145 mEq/L   Potassium 5.2 (*) 3.5 - 5.1 mEq/L   Chloride 104  96 - 112 mEq/L   CO2 23  19 - 32 mEq/L   Glucose, Bld 232 (*) 70 - 99 mg/dL   BUN 23  6 - 23 mg/dL   Creatinine, Ser 4.69 (*) 0.50 - 1.35 mg/dL   Calcium 7.8 (*) 8.4 - 10.5 mg/dL   Total Protein 5.8 (*) 6.0 - 8.3 g/dL   Albumin 2.7 (*) 3.5 - 5.2 g/dL   AST 22  0 - 37 U/L   ALT 34  0 - 53 U/L   Alkaline Phosphatase 76  39 - 117 U/L   Total Bilirubin 0.2 (*) 0.3 - 1.2 mg/dL   GFR calc non Af Amer 32 (*) >90 mL/min   GFR calc Af Amer 37 (*) >90 mL/min  PROTIME-INR     Status: Abnormal   Collection Time   05/30/12  1:25 AM      Component Value Range   Prothrombin Time 24.1 (*) 11.6 - 15.2  seconds   INR 2.28 (*) 0.00 - 1.49  GLUCOSE, CAPILLARY     Status: Abnormal   Collection Time   05/30/12  8:12 AM      Component Value Range   Glucose-Capillary 120 (*) 70 - 99 mg/dL    Studies/Results: @RISRSLT24 @  Medications: Reviwed   Patient Active Hospital Problem List: HYPERLIPIDEMIA (11/26/2009)   HYPERTENSION (11/26/2009)   Assessment: BP is good   ATRIAL FIBRILLATION, PAROXYSMAL (11/26/2009)   Assessment:  Tele is difficult to see if in/out afib.  WIll get EKGs  Rates have been less than 100  Bradycardia (05/29/2012)   Assessment: HR is labile  Improved but then while talking to patient went to junctional.  I have reivewed with EP  They will see patient re potential PPM  Renal  insufficiency (05/29/2012  hyperkalemia (05/29/2012)      LOS: 1 day   Dietrich Pates 05/30/2012, 10:34 AM

## 2012-05-30 NOTE — Progress Notes (Signed)
Patient had complaint of headache. Notified provider on call  - new orders received. Will continue to monitor patient.

## 2012-05-30 NOTE — Progress Notes (Signed)
Notified physician of increase in SBP 170s - 180s. New orders received. Will continue to monitor patient.

## 2012-05-30 NOTE — Progress Notes (Signed)
[facility] Progress Note [place] TEAM 1 - Stepdown/ICU TEAM   Tyler [name] ZOX:[id number] DOB: [birth year] DOA: 05/29/2012 PCP: [name], MD  Brief narrative: Patient is a 78 year old black male resident of an assisted living facility, history of chronic diastolic dysfunction history of paroxysmal atrial fibrillation on chronic Coumadin therapy, history of sick sinus syndrome, hypertension, diabetes who was transferred from the skilled nursing facility for evaluation of the above-noted complaints. Upon evaluation by EMS in the field, he was found to be bradycardic. ECG revealed a junctional rhythm. Patient denied any ongoing dizziness, but he claimed that he has been having intermittent dizziness, exertional dyspnea, headache, abdominal bloating intermittently for the past few months. On the morning of admission he claimed that he had some left sided chest pain, that he cannot describe very well. There was no radiation of the pain. It was not clear whether this chest pain was associated with shortness of breath or nausea. He claimed that he could only ambulate around 10-15 feet before he gets short of breath, over the past few days he did claim that he gets lightheaded when he walks. He denied any fever, he denies any cough. He denies any vomiting. Denied any diarrhea. In the ED he was found to have a junctional rhythm, cardiology has already evaluated the patient. He also was found to have hyperkalemia. He was admitted to the step down unit for further monitoring and treatment.   Assessment/Plan: Principal Problem:  Symptomatic Bradycardia: Junctional rhythm-known  ATRIAL FIBRILLATION, PAROXYSMAL *Appreciate cardiology assistance *Continue to hold offending medications-was on carvedilol prior to admission *EP evaluation pending for possible permanent pacemaker insertion *During our examination patient was in an accelerated junctional rhythm with rates in the 70s and 80s, also  was noted during that same time period to have sinus pauses *Daughter's report patient had similar problem back in 2010 and was evaluated by cardiologist in [place] who is giving consideration to possible pacemaker if symptoms did not improve or recurred *History of cardiac catheterization in 2003 without any evidence of coronary disease  Active Problems:  HYPERTENSION *Moderately controlled off medicines-consider addition of non-AV nodal blocking agent if blood pressure continues to increase   CKD (chronic kidney disease) stage 3, GFR 30-59 ml/min *Likely has a degree of mild acute renal insufficiency do to dehydration and diminished perfusion related to ongoing symptomatic bradycardia *Currently slightly above baseline creatinine of 1.6   UTI (lower urinary tract infection)/Leukocytosis *Urine culture pending *Continue empiric Rocephin   Dehydration *Patient was on 2 diuretics prior to admission-these remain on hold *Continue IV fluid hydration *Followup on echocardiogram. Discuss with patient's family and they are unaware of any previous heart failure diagnosis or issues with chronic edema   Diabetes mellitus, type 2 *Controlled here-will check hemoglobin A1c-CBGs greater than 300 at presentation *Continue Lantus and sliding scale insulin   HYPERLIPIDEMIA *Continue statin   GERD *Continue Protonix   Hyperkalemia *Likely related to dehydration and the Aldactone the patient was taking prior to admission *Last electrolyte panel was checked at 2 AM so we will repeat at 2 PM today   Constipation, chronic *Multiple influencing factors: Diuretic use, beta blocker use, oral iron use, diabetes with likely gastroparesis, and decreased mobility *Check portable KUB since abdomen somewhat distended today *Begin MiraLax daily-continue Senokot   Warfarin anticoagulation *For paroxysmal atrial fibrillation-pharmacy managing dosing while inpatient   Obesity/OSA (obstructive sleep  apnea)-noncompliant with CPAP *Bradycardia and sinus pauses occurring while patient awake so doubtful sleep apnea contributing to the  symptoms   DVT prophylaxis: On Coumadin-INR therapeutic at presentation Code Status: Full Family Communication: Spoke directly to patient and his daughters at the bedside Disposition Plan: Remain in step down unit  Consultants: Cardiology Electrophysiology  Procedures: None  Antibiotics: Rocephin 11/6 >>>  HPI/Subjective: Patient awakened without any specific complaints. Family able to elaborate on history. Patient has some chronic dyspnea on exertion that has worsened over the past 6 months and especially worse over the past 2 weeks. Patient normally and relates with the walker.   Objective: Blood pressure 148/61, pulse 85, temperature 98.6 F (37 C), temperature source Oral, resp. rate 25, height 5' 10.5" (1.791 m), weight 122.2 kg (269 lb 6.4 oz), SpO2 100.00%.  Intake/Output Summary (Last 24 hours) at 05/30/12 1319 Last data filed at 05/30/12 1032  Gross per 24 hour  Intake    633 ml  Output    400 ml  Net    233 ml     Exam: General: No acute respiratory distress Lungs: Clear to auscultation bilaterally without wheezes or crackles, nasal cannula oxygen with saturations 100% Cardiovascular: Irregular rate and rhythm without murmur gallop or rub normal S1 and S2-mostly in accelerated junctional rhythm with occasional sinus pauses less than 2 seconds, no peripheral edema Abdomen: Nontender, nondistended, soft, bowel sounds positive, no rebound, no ascites, no appreciable mass Musculoskeletal: No significant cyanosis, clubbing of bilateral lower extremities Neurological: Patient is awake and alert, oriented times name place and time of day but not to year. Moves all extremities x4 weekly, exam non-focal  Data Reviewed: Basic Metabolic Panel:  Lab 05/30/12 6578 05/29/12 1817 05/29/12 1254 05/29/12 1240  NA 135 136 137 135  K 5.2* 5.1  5.8* 5.8*  CL 104 105 107 103  CO2 23 22 -- 22  GLUCOSE 232* 125* 302* 311*  BUN 23 20 19 18   CREATININE 1.94* 1.83* 1.60* 1.64*  CALCIUM 7.8* 8.6 -- 8.5  MG -- -- -- 2.2  PHOS -- -- -- --   Liver Function Tests:  Lab 05/30/12 0125 05/29/12 1240  AST 22 45*  ALT 34 44  ALKPHOS 76 90  BILITOT 0.2* 0.2*  PROT 5.8* 6.7  ALBUMIN 2.7* 3.1*   No results found for this basename: LIPASE:5,AMYLASE:5 in the last 168 hours No results found for this basename: AMMONIA:5 in the last 168 hours CBC:  Lab 05/30/12 0125 05/29/12 1254 05/29/12 1240  WBC 7.9 -- 8.0  NEUTROABS -- -- 6.2  HGB 9.9* 11.6* 11.2*  HCT 29.4* 34.0* 34.0*  MCV 88.6 -- 88.5  PLT 154 -- 170   Cardiac Enzymes:  Lab 05/30/12 0008 05/29/12 1818 05/29/12 1240  CKTOTAL 57 65 --  CKMB 1.8 2.0 --  CKMBINDEX -- -- --  TROPONINI <0.30 <0.30 <0.30   BNP (last 3 results) No results found for this basename: PROBNP:3 in the last 8760 hours CBG:  Lab 05/30/12 1206 05/30/12 0812 05/29/12 2128 05/29/12 1733 05/29/12 1520  GLUCAP 158* 120* 223* 127* 156*    Recent Results (from the past 240 hour(s))  MRSA PCR SCREENING     Status: Normal   Collection Time   05/29/12  5:38 PM      Component Value Range Status Comment   MRSA by PCR NEGATIVE  NEGATIVE Final      Studies:  Recent x-ray studies have been reviewed in detail by the Attending Physician  Scheduled Meds:  Reviewed in detail by the Attending Physician   Junious Silk, ANP [facility] Office  [phone]  Pager [phone]  On-Call/Text Page:      [email or web address]      password [email or web address]  If 7PM-7AM, please contact night-coverage [email or web address] Password [email or web address] 05/30/2012, 1:19 PM   LOS: 1 day   I have examined the patient and reviewed the chart. I agree with the above note.   Calvert Cantor, MD [phone]

## 2012-05-30 NOTE — Care Management Note (Signed)
    Page 1 of 1   06/10/2012     2:56:56 PM   CARE MANAGEMENT NOTE 06/10/2012  Patient:  TREMELL, KISSELL   Account Number:  [id number]  Date Initiated:  05/30/2012  Documentation initiated by:  Junius Creamer  Subjective/Objective Assessment:   adm w [facility]     Action/Plan:   lives at nsg facility, sw ref   Anticipated DC Date:  06/10/2012   Anticipated DC Plan:  ASSISTED LIVING / REST HOME  In-house referral  Clinical Social Worker      DC Planning Services  CM consult      Choice offered to / List presented to:             Status of service:  Completed, signed off Medicare Important Message given?   (If response is "NO", the following Medicare IM given date fields will be blank) Date Medicare IM given:   Date Additional Medicare IM given:    Discharge Disposition:  ASSISTED LIVING  Per UR Regulation:  Reviewed for med. necessity/level of care/duration of stay  If discussed at Long Length of Stay Meetings, dates discussed:   06/04/2012    Comments:  06/10/12 Paelyn Smick,RN,BSN [phone] PT DISCHARGED TO [facility], PER CSW ARRANGEMENTS.  06/04/12 Mary-Anne Polizzi,RN,BSN [phone] PT ADM FROM [facility].  CSW CONSULTED TO FACILITATE DC TO ALF WHEN MEDICALLY STABLE FOR DC.   11/7 11:58a debbie dowell rn,bsn [phone]

## 2012-05-31 DIAGNOSIS — E875 Hyperkalemia: Secondary | ICD-10-CM

## 2012-05-31 DIAGNOSIS — N390 Urinary tract infection, site not specified: Secondary | ICD-10-CM

## 2012-05-31 LAB — PROTIME-INR: Prothrombin Time: 17.4 seconds — ABNORMAL HIGH (ref 11.6–15.2)

## 2012-05-31 LAB — URINE CULTURE

## 2012-05-31 LAB — GLUCOSE, CAPILLARY: Glucose-Capillary: 242 mg/dL — ABNORMAL HIGH (ref 70–99)

## 2012-05-31 LAB — HEMOGLOBIN A1C: Mean Plasma Glucose: 146 mg/dL — ABNORMAL HIGH (ref <117)

## 2012-05-31 MED ORDER — MAGNESIUM HYDROXIDE 400 MG/5ML PO SUSP
15.0000 mL | Freq: Every day | ORAL | Status: DC | PRN
Start: 2012-05-31 — End: 2012-06-05

## 2012-05-31 MED ORDER — FAMOTIDINE 40 MG PO TABS
40.0000 mg | ORAL_TABLET | Freq: Two times a day (BID) | ORAL | Status: DC
  Administered 2012-05-31 – 2012-06-10 (×20): 40 mg via ORAL
  Filled 2012-05-31 (×23): qty 1

## 2012-05-31 MED ORDER — MAGNESIUM CITRATE PO SOLN
1.0000 | Freq: Once | ORAL | Status: AC
  Administered 2012-05-31: 1 via ORAL
  Filled 2012-05-31: qty 296

## 2012-05-31 NOTE — Progress Notes (Signed)
[facility] Progress Note [place] TEAM 1 - Stepdown/ICU TEAM   TRUE COWDIN NWG:[id number] DOB: January 11, 1934 DOA: 05/29/2012 PCP: [name], MD  Brief narrative: Patient is a 78 year old black male resident of an assisted living facility, history of chronic diastolic dysfunction history of paroxysmal atrial fibrillation on chronic Coumadin therapy, history of sick sinus syndrome, hypertension, diabetes who was transferred from the skilled nursing facility for evaluation of the above-noted complaints. Upon evaluation by EMS in the field, he was found to be bradycardic. ECG revealed a junctional rhythm. Patient denied any ongoing dizziness, but he claimed that he has been having intermittent dizziness, exertional dyspnea, headache, abdominal bloating intermittently for the past few months. On the morning of admission he claimed that he had some left sided chest pain, that he cannot describe very well. There was no radiation of the pain. It was not clear whether this chest pain was associated with shortness of breath or nausea. He claimed that he could only ambulate around 10-15 feet before he gets short of breath, over the past few days he did claim that he gets lightheaded when he walks. He denied any fever, he denies any cough. He denies any vomiting. Denied any diarrhea. In the ED he was found to have a junctional rhythm, cardiology has already evaluated the patient. He also was found to have hyperkalemia. He was admitted to the step down unit for further monitoring and treatment.   Assessment/Plan: Principal Problem:  Symptomatic Bradycardia: Junctional rhythm-known  ATRIAL FIBRILLATION, PAROXYSMAL *Appreciate cardiology assistance *Continue to hold offending medications-was on carvedilol prior to admission *EP evaluation pending for possible permanent pacemaker insertion *Daughter's report patient had similar problem back in 2010 and was evaluated by cardiologist in [place] who  recommended a pacemaker- pt unfortunately was not able to follow up with him *History of cardiac catheterization in 2003 without any evidence of coronary disease  Active Problems:  HYPERTENSION *Moderately controlled off medicines-consider addition of non-AV nodal blocking agent if blood pressure continues to increase-hydralazine has been started   CKD (chronic kidney disease) stage 3, GFR 30-59 ml/min *Likely has a degree of mild acute renal insufficiency do to dehydration and diminished perfusion related to ongoing symptomatic bradycardia *Currently slightly above baseline creatinine of 1.6   Klebsiella UTI (lower urinary tract infection)/Leukocytosis *Urine culture demonstrates ampicillin resistant Klebsiella *Continue Rocephin *Since likely will need pacemaker implantation this admission we'll go ahead and check blood cultures to ensure no bacteremia secondary to UTI   Dehydration *Patient was on 2 diuretics prior to admission-these remain on hold *Continue IV fluid hydration *Echocardiogram this admission reveals normal systolic function and parameters consistent with grade 2 diastolic dysfunction. *Discussed with patient's family and they are unaware of any previous heart failure diagnosis or issues with chronic edema   Diabetes mellitus, type 2 *Controlled here-will check hemoglobin A1c-CBGs greater than 300 at presentation *Continue Lantus and sliding scale insulin   HYPERLIPIDEMIA *Continue statin   GERD *Will discontinue Protonix in favor of H2 blocker since core antibiotics and wish to decrease risk of C. difficile colitis *GERD appears to be secondary to bowel dysmotility so patient has been instructed on eating 5-6 small meals daily   Hyperkalemia *Likely related to dehydration and the Aldactone the patient was taking prior to admission *Last electrolyte panel was checked at 2 AM so we will repeat at 2 PM today   Constipation, chronic/colonic dysfunction-Ogilvie's  syndrome *Multiple influencing factors: Diuretic use, beta blocker use, oral iron use, diabetes with likely gastroparesis,  and decreased mobility *KUB reveals dilated bowel and retained stool *Continue MiraLax and Senokot *Begin aggressive disimpaction protocol with laxatives and enemas   Warfarin anticoagulation *For paroxysmal atrial fibrillation-pharmacy managing dosing while inpatient-Coumadin likely needs to be held given anticipation of pacemaker implantation this admission *Once INR less than 2 will need heparin bridge   Obesity/OSA (obstructive sleep apnea)-noncompliant with CPAP *Bradycardia and sinus pauses occurring while patient awake so doubtful sleep apnea contributing to the symptoms   DVT prophylaxis: On Coumadin-INR therapeutic at presentation Code Status: Full Family Communication: Spoke directly to patient and his daughters at the bedside Disposition Plan: Remain in step down unit  Consultants: Cardiology Electrophysiology  Procedures: None  Antibiotics: Rocephin 11/6 >>>  HPI/Subjective: Patient is now sitting up in chair. No specific complaints. Family feels the abdomen is less distended. Multiple questions answered from family members. Careful explanation of current condition and rationale for waiting to proceed with platelet pacemaker implantation until all medical problems have stabilized has been explained to the patient and his family and they verbalized understanding of this concept.   Objective: Blood pressure 145/44, pulse 90, temperature 98.2 F (36.8 C), temperature source Oral, resp. rate 28, height 5' 10.5" (1.791 m), weight 120.6 kg (265 lb 14 oz), SpO2 95.00%.  Intake/Output Summary (Last 24 hours) at 05/31/12 1248 Last data filed at 05/30/12 2306  Gross per 24 hour  Intake    206 ml  Output    600 ml  Net   -394 ml     Exam: General: No acute respiratory distress Lungs: Clear to auscultation bilaterally without wheezes or crackles,  nasal cannula oxygen with saturations 100% Cardiovascular: Irregular rate and rhythm without murmur gallop or rub normal S1 and S2-mostly in accelerated junctional rhythm  Abdomen: Nontender, nondistended, soft, bowel sounds positive, no rebound, no ascites, no appreciable mass Musculoskeletal: No significant cyanosis, clubbing of bilateral lower extremities Neurological: Patient is awake and alert, oriented times name place and time of day but not to year. Moves all extremities x4 weekly, exam non-focal  Data Reviewed: Basic Metabolic Panel:  Lab 05/30/12 0981 05/30/12 0125 05/29/12 1817 05/29/12 1254 05/29/12 1240  NA 136 135 136 137 135  K 4.5 5.2* 5.1 5.8* 5.8*  CL 102 104 105 107 103  CO2 24 23 22  -- 22  GLUCOSE 194* 232* 125* 302* 311*  BUN 18 23 20 19 18   CREATININE 1.53* 1.94* 1.83* 1.60* 1.64*  CALCIUM 8.1* 7.8* 8.6 -- 8.5  MG -- -- -- -- 2.2  PHOS -- -- -- -- --   Liver Function Tests:  Lab 05/30/12 0125 05/29/12 1240  AST 22 45*  ALT 34 44  ALKPHOS 76 90  BILITOT 0.2* 0.2*  PROT 5.8* 6.7  ALBUMIN 2.7* 3.1*   No results found for this basename: LIPASE:5,AMYLASE:5 in the last 168 hours No results found for this basename: AMMONIA:5 in the last 168 hours CBC:  Lab 05/30/12 0125 05/29/12 1254 05/29/12 1240  WBC 7.9 -- 8.0  NEUTROABS -- -- 6.2  HGB 9.9* 11.6* 11.2*  HCT 29.4* 34.0* 34.0*  MCV 88.6 -- 88.5  PLT 154 -- 170   Cardiac Enzymes:  Lab 05/30/12 0008 05/29/12 1818 05/29/12 1240  CKTOTAL 57 65 --  CKMB 1.8 2.0 --  CKMBINDEX -- -- --  TROPONINI <0.30 <0.30 <0.30   BNP (last 3 results) No results found for this basename: PROBNP:3 in the last 8760 hours CBG:  Lab 05/31/12 1237 05/31/12 0759 05/30/12 2111 05/30/12 1733 05/30/12  1206  GLUCAP 242* 165* 169* 163* 158*    Recent Results (from the past 240 hour(s))  URINE CULTURE     Status: Normal   Collection Time   05/29/12  2:38 PM      Component Value Range Status Comment   Specimen Description  URINE, CLEAN CATCH   Final    Special Requests NONE   Final    Culture  Setup Time 05/29/2012 15:43   Final    Colony Count >=100,000 COLONIES/ML   Final    Culture KLEBSIELLA PNEUMONIAE   Final    Report Status 05/31/2012 FINAL   Final    Organism ID, Bacteria KLEBSIELLA PNEUMONIAE   Final   MRSA PCR SCREENING     Status: Normal   Collection Time   05/29/12  5:38 PM      Component Value Range Status Comment   MRSA by PCR NEGATIVE  NEGATIVE Final      Studies:  Recent x-ray studies have been reviewed in detail by the Attending Physician  Scheduled Meds:  Reviewed in detail by the Attending Physician   Junious Silk, ANP [facility] Office  [phone] Pager [phone]  On-Call/Text Page:      [email or web address]      password [email or web address]  If 7PM-7AM, please contact night-coverage [email or web address] Password [email or web address] 05/31/2012, 12:48 PM   LOS: 2 days   I have examined the patient and reviewed the chart. I agree with the above note which I have modified.   Calvert Cantor, MD [phone]

## 2012-05-31 NOTE — Clinical Social Work Psychosocial (Signed)
     Clinical Social Work Department BRIEF PSYCHOSOCIAL ASSESSMENT 05/31/2012  Patient:  Tyler [name], Tyler [name]     Account Number:  [id number]     Admit date:  05/29/2012  Clinical Social Worker:  Hulan Fray  Date/Time:  05/31/2012 02:44 PM  Referred by:  Care Management  Date Referred:  05/31/2012 Referred for  Other - See comment   Other Referral:   admit from facility   Interview type:  Patient Other interview type:   wife- Alma  Daughter- [name]    PSYCHOSOCIAL DATA Living Status:  FACILITY Admitted from facility:  [facility] Level of care:  Assisted Living Primary support name:  Alma Primary support relationship to patient:  SPOUSE Degree of support available:   supportive    CURRENT CONCERNS Current Concerns  None Noted   Other Concerns:    SOCIAL WORK ASSESSMENT / PLAN Clinical Social Worker received referral for patient being admitted from facility. CSW introduced self and explained reason for visit. Paitent's wife and daughter were at bedside. Per family, patient is a resident of [facility] and plan is to return at discharge.    CSW spoke with Victorino Dike at the facility and she reported that they do not take weekend admissions and if he is medically stable, they will admit on Monday. Per Victorino Dike, all discharge paperwork will need to be in before 2pm on day of discharge. CSW will complete FL2 for MD's signature and continue to follow.   Assessment/plan status:  Psychosocial Support/Ongoing Assessment of Needs Other assessment/ plan:   Information/referral to community resources:   Patient is from a facility    PATIENTS/FAMILYS RESPONSE TO PLAN OF CARE: Family reported that plan is for patient to return to [facility] at discharge.

## 2012-05-31 NOTE — Consult Note (Signed)
ELECTROPHYSIOLOGY CONSULT NOTE  Patient ID: PERNELL BELLVILLE MRN: [id number], DOB/AGE: 10-05-1933   Admit date: 05/29/2012 Date of Consult: 05/31/2012  Primary Physician: [name], MD Primary Cardiologist: Gala Romney, MD Reason for Consultation: Bradycardia  History of Present Illness Mr. Dinsdale is a 78 year old man with history of normal coronaries by cath 2003, chronic diastolic HF, PAFib, HTN, DM, GERD and OSA (noncompliant w/ CPAP) who was transferred from his nursing facility to [facility] on 05/29/2012 for chest pain and bradycardia. During transport EMS noted his heart rate was in the 30-40s. On admission he reported lightheadedness with ambulation x 2 days. He denies syncope or falls. On admission he was found to have junctional bradycardia in setting of hyperkalemia and beta blockade. His carvedilol has been held and his hyperkalemia corrected. This AM he experienced recurrent junctional bradycardia; therefore, Dr. Tenny Craw has requested [name] see him in consultation for EP recommendations regarding need for PPM. Mr. Toomes is accompanied by his daughters who are at bedside and assist with history questions. They report Mr. Secrease has had DOE x "years" and most recently seems to be fatigued almost all of the time. They also state he has had issues with bradycardia since 2000 at which time he was being followed by a cardiologist in [place], [place].   Past Medical History  Diagnosis Date  . Diabetes mellitus 1980  . Hyperlipidemia 1980  . Hypertension 1980  . Asthma   . Allergic rhinitis   . GERD (gastroesophageal reflux disease)   . BPH (benign prostatic hypertrophy)     s/p thermo therapy, has bladdero utlet obst. and instablitiy, incontient  . Anemia   . Chronic constipation   . Sick sinus syndrome   . Paroxysmal atrial fibrillation     status post direct cardioversion- normal cardiac cath 2003, with normal left ventricular function (done after an abnormal  cardiolite)  . Bradycardia     severe, on high doese AV-nodal blockers  . Bifascicular block     chronic  . Obesity   . Sleep apnea     noncomplicance w/ CPAP  . Hypoxia     in the past, likely multifactorial  . Gastroparesis     dx in 2006 through gastric scan  . Gynecomastia     nomral mammograms 11/2008  . Shortness of breath   . Chronic kidney disease     Past Surgical History  Procedure Date  . Cataract extraction     bilateral  . Rotator cuff repair     left  . Cardiac catheterization 2003    normal, left ventricular function (done after an abnormal cardiolite)  . Tumor removal     off arms     Allergies/Intolerances No Known Allergies  Inpatient Medications    . aspirin EC  81 mg Oral Daily  . cefTRIAXone (ROCEPHIN)  IV  1 g Intravenous Q24H  . cromolyn  1 drop Both Eyes BID  . ferrous sulfate  325 mg Oral Q breakfast  . hydrALAZINE  10 mg Intravenous Q6H  . insulin aspart  0-9 Units Subcutaneous TID WC  . insulin glargine  39 Units Subcutaneous QHS  . ipratropium  2 spray Nasal TID  . mometasone-formoterol  2 puff Inhalation BID  . multivitamin with minerals  1 tablet Oral Daily  . neomycin-bacitracin-polymyxin  1 application Topical Daily  . pantoprazole  40 mg Oral BID  . [EXPIRED] perflutren lipid microspheres (DEFINITY) IV suspension      .  polyethylene glycol  17 g Oral Daily  . sertraline  25 mg Oral Daily  . simvastatin  20 mg Oral QHS  . sodium chloride  1 spray Nasal QID  . sodium chloride  3 mL Intravenous Q12H  . trolamine salicylate  1 application Topical TID      . [DISCONTINUED] sodium chloride 75 mL/hr at 05/29/12 2312    Family History Family History  Problem Relation Age of Onset  . Colon cancer Neg Hx   . Prostate cancer Maternal Uncle   . Heart disease Paternal Uncle     x 3, paternal aunt     Social History Social History  . Marital Status: Widowed    Spouse Name: N/A    Number of Children: 2   Occupational History    . retired    Social History Main Topics  . Smoking status: Former Smoker    Types: Cigarettes    Quit date: 07/25/1971  . Smokeless tobacco: Former Neurosurgeon    Types: Chew  . Alcohol Use: No  . Drug Use: No   Social History Narrative   Has lived in facility x aprox 4 yearsMoved to [place] 2-2010Does not have a POADoes not have a living will    Review of Systems General: No chills, fever, night sweats or weight changes  Cardiovascular: +++DOE No chest pain, edema, orthopnea, palpitations, paroxysmal nocturnal dyspnea Dermatological: No rash, lesions or masses Respiratory: +++DOE No cough Urologic: No hematuria, dysuria Abdominal: No nausea, vomiting, diarrhea, bright red blood per rectum, melena, or hematemesis Neurologic: No visual changes, weakness, changes in mental status All other systems reviewed and are otherwise negative except as noted above.  Physical Exam Blood pressure 175/62, pulse 87, temperature 98.3 F (36.8 C), temperature source Oral, resp. rate 28, height 5' 10.5" (1.791 m), weight 265 lb 14 oz (120.6 kg), SpO2 96.00%.  General: Well developed 78 year old male in no acute distress. HEENT: Normocephalic, atraumatic. EOMs intact. Sclera nonicteric. Oropharynx clear.  Neck: Supple. No JVD. Lungs: Respirations regular and unlabored, CTA bilaterally. No wheezes, rales or rhonchi. Heart: RRR. S1, S2 present. No murmurs, rub, S3 or S4. Abdomen: Soft, non-distended.  Extremities: No clubbing, cyanosis or edema. DP/PT/Radials 2+ and equal bilaterally. Psych: Normal affect. Neuro: Alert and oriented X 3. Moves all extremities spontaneously. Musculoskeletal: No kyphosis. Skin: Intact. Warm and dry. No rashes or petechiae in exposed areas.   Labs  Basename 05/30/12 0008 05/29/12 1818 05/29/12 1240  CKTOTAL 57 65 --  CKMB 1.8 2.0 --  TROPONINI <0.30 <0.30 <0.30   Lab Results  Component Value Date   WBC 7.9 05/30/2012   HGB 9.9* 05/30/2012   HCT 29.4* 05/30/2012    MCV 88.6 05/30/2012   PLT 154 05/30/2012    Lab 05/30/12 1428 05/30/12 0125  NA 136 --  K 4.5 --  CL 102 --  CO2 24 --  BUN 18 --  CREATININE 1.53* --  CALCIUM 8.1* --  PROT -- 5.8*  BILITOT -- 0.2*  ALKPHOS -- 76  ALT -- 34  AST -- 22  GLUCOSE 194* --    Mg level on admission 2.2 Potassium on admission 5.8  No results found for this basename: TSH,T4TOTAL,FREET3,T3FREE,THYROIDAB in the last 72 hours   Radiology/Studies Dg Chest Port 1 View  05/29/2012  *RADIOLOGY REPORT*  Clinical Data: Bradycardia.  Diabetes.  Nausea.  PORTABLE CHEST - 1 VIEW  Comparison: 10/07/2010.  Findings: Low volume chest.  Apical lordotic projection. Defibrillator pads project over  the chest.  Monitoring leads are also present on the chest.  Allowing for the low volumes of inspiration, the heart lungs appear within normal limits.  IMPRESSION: Low volume chest.   Original Report Authenticated By: Andreas Newport, M.D.    Dg Abd Portable 1v  05/30/2012  *RADIOLOGY REPORT*  Clinical Data: History of abdominal distention.  History of small amount of bowel movements the last couple of days.  Nausea. History of constipation.  History of urinary tract infections.  PORTABLE ABDOMEN - 1 VIEW  Comparison: 05/29/2012.  Findings: There are multiple gas-filled loops of bowel which appear very dilated.  These appear to be colonic.  There is some gas within the rectum.  The rectum is not dilated.  No significant small-bowel dilatation cannot be appreciated.  There is mild gaseous distention of the mid and distal portion of the stomach. Fecal burden is small.  No opaque calculi are seen.  There is degenerative spondylosis.  IMPRESSION: Multiple gas-filled loops of intestine which appear dilated and appear to be colonic.  There is gas within the nondilated rectum. The most distended loop is in the lower left abdomen.  It was distended on the previous study to a caliber of 8.6 cm.  On the current study it is distended up to caliber  of 10 cm.  No colonic wall edema or pneumatosis is evident. Fecal burden appears small. Colonic partial obstruction cannot be excluded.   Original Report Authenticated By: Onalee Hua Call    Dg Abd Portable 2v  05/29/2012  *RADIOLOGY REPORT*  Clinical Data: Chest pain.  Abdominal pain.  PORTABLE ABDOMEN - 2 VIEW  Comparison: 03/19/2008 CT.  Findings: Nonobstructive bowel gas pattern is present.  There is gaseous distention of colon in the left mid abdomen, likely within redundant sigmoid.  No gross plain film evidence of free air.  Prominent stool is present in the right colon.  Monitoring leads project over the lower chest along with a defibrillator pad.  Gas is present in the anatomic pelvis however the rectum is not visualized on these two views.  IMPRESSION: Gaseous distention of the colon.  Overall bowel gas pattern is nonobstructive.   Original Report Authenticated By: Andreas Newport, M.D.     Echocardiogram  Study Conclusions  - Left ventricle: The cavity size was normal. Wall thickness was increased in a pattern of mild LVH. Systolic function was normal. The estimated ejection fraction was in the range of 60% to 65%. Wall motion was normal; there were no regional wall motion abnormalities. Definity echo contrast was used. Features are consistent with a pseudonormal left ventricular filling pattern, with concomitant abnormal relaxation and increased filling pressure (grade 2 diastolic dysfunction). - Aortic valve: There was no stenosis. - Mitral valve: Trivial regurgitation. - Left atrium: The atrium was mildly dilated. - Right ventricle: Poorly visualized. - Systemic veins: IVC measured 2.3 cm with normal respirophasic variation, suggesting RA pressure 6-10 mmHg.  Impressions: - Technically difficult study with poor acoustic windows. Normal LV size and systolic function with mild LV hypertrophy. EF 60-65%. Moderate diastolic dysfunction. The RV was poorly visualized.   12-lead ECG  on admission shows a junctional escape rhythm at 36 bpm; QRS 136 msec  Telemetry normal sinus rhythm currently; intermittent junctional bradycardia in the 40s since admission  Assessment and Plan 1. Bradycardia 2. PAFib 3. Chronic diastolic HF 4. CKD 5. UTI 6. OSA   Signed, Rick Duff, PA-C 05/31/2012, 10:36 AM  I have seen, examined the patient, and reviewed the above  assessment and plan.  Changes to above are made where necessary.  Pt with multiple medical issues, including hypokalemia/ acute on chronic renal failure, dehydration, UTI, and bradycardia.  Heart rates have improved off of coreg, though he continues to demonstrate some signs of sinus node dysfunction.  I am not completely certain that his symptoms are due to bradycardia however. We will continue to observe on telemetry over the weekend and make a decision about pacemaker implantation in 2-3 days.  He will need to have his UTI treated in the interim. I will check TFTs.  Would avoid chronotropic depressants going forward.  Up with assistance. If rhythm remains stable, he could potentially go to telemetry tomorrow.  Co Sign: [name], MD 05/31/2012 4:34 PM

## 2012-05-31 NOTE — Progress Notes (Signed)
EP to see patient. HR has increased since yesterday  Will get patient up and moving to follow HR response and symptoms. Will feed patient .  Check INR and resume coumadin as needed.

## 2012-06-01 DIAGNOSIS — K5900 Constipation, unspecified: Secondary | ICD-10-CM

## 2012-06-01 LAB — GLUCOSE, CAPILLARY
Glucose-Capillary: 218 mg/dL — ABNORMAL HIGH (ref 70–99)
Glucose-Capillary: 299 mg/dL — ABNORMAL HIGH (ref 70–99)
Glucose-Capillary: 292 mg/dL — ABNORMAL HIGH (ref 70–99)

## 2012-06-01 LAB — BASIC METABOLIC PANEL
BUN: 16 mg/dL (ref 6–23)
CO2: 27 mEq/L (ref 19–32)
Calcium: 8.7 mg/dL (ref 8.4–10.5)
Creatinine, Ser: 1.33 mg/dL (ref 0.50–1.35)

## 2012-06-01 LAB — T4, FREE: Free T4: 1.18 ng/dL (ref 0.80–1.80)

## 2012-06-01 LAB — TSH: TSH: 0.221 u[IU]/mL — ABNORMAL LOW (ref 0.350–4.500)

## 2012-06-01 MED ORDER — PEG 3350-KCL-NA BICARB-NACL 420 G PO SOLR
4000.0000 mL | Freq: Once | ORAL | Status: AC
  Administered 2012-06-01: 4000 mL via ORAL
  Filled 2012-06-01: qty 4000

## 2012-06-01 MED ORDER — HEPARIN SODIUM (PORCINE) 5000 UNIT/ML IJ SOLN
5000.0000 [IU] | Freq: Three times a day (TID) | INTRAMUSCULAR | Status: DC
  Administered 2012-06-01 – 2012-06-06 (×15): 5000 [IU] via SUBCUTANEOUS
  Filled 2012-06-01 (×17): qty 1

## 2012-06-01 NOTE — Progress Notes (Signed)
[facility] Progress Note [place] TEAM 1 - Stepdown/ICU TEAM   Tyler [name] ZOX:[id number] DOB: 1934-05-31 DOA: 05/29/2012 PCP: [name], MD  Brief narrative: Patient is a 78 year old black male resident of an assisted living facility, history of chronic diastolic dysfunction history of paroxysmal atrial fibrillation on chronic Coumadin therapy, history of sick sinus syndrome, hypertension, diabetes who was transferred from the skilled nursing facility for evaluation of the above-noted complaints. Upon evaluation by EMS in the field, he was found to be bradycardic. ECG revealed a junctional rhythm. Patient denied any ongoing dizziness, but he claimed that he has been having intermittent dizziness, exertional dyspnea, headache, abdominal bloating intermittently for the past few months. On the morning of admission he claimed that he had some left sided chest pain, that he cannot describe very well. There was no radiation of the pain. It was not clear whether this chest pain was associated with shortness of breath or nausea. He claimed that he could only ambulate around 10-15 feet before he gets short of breath, over the past few days he did claim that he gets lightheaded when he walks. He denied any fever, he denies any cough. He denies any vomiting. Denied any diarrhea. In the ED he was found to have a junctional rhythm, cardiology has already evaluated the patient. He also was found to have hyperkalemia. He was admitted to the step down unit for further monitoring and treatment.   Assessment/Plan: Principal Problem:  Symptomatic Bradycardia: Junctional rhythm-known  ATRIAL FIBRILLATION, PAROXYSMAL *Management per cardiology *Continue to hold offending medications-was on carvedilol prior to admission *Daughter's report patient had similar problem back in 2010 and was evaluated by cardiologist in [place] who recommended a pacemaker- pt unfortunately was not able to follow up with  him *History of cardiac catheterization in 2003 without any evidence of coronary disease  Active Problems:  HYPERTENSION  IV hydralazine has been started   CKD (chronic kidney disease) stage 3, GFR 30-59 ml/min *Likely has a degree of mild acute renal insufficiency do to dehydration and diminished perfusion related to ongoing symptomatic bradycardia - no improved   Klebsiella UTI (lower urinary tract infection)/Leukocytosis *Urine culture demonstrates ampicillin resistant Klebsiella *Continue Rocephin *Since likely will need pacemaker implantation this admission blood cultures ordered on 11/9- negative thus far   Dehydration *Patient was on 2 diuretics prior to admission-these remain on hold *has been hydrated via IVF- no discontinued *Echocardiogram this admission reveals normal systolic function and parameters consistent with grade 2 diastolic dysfunction.  *Discussed with patient's family and they are unaware of any previous heart failure diagnosis or issues with chronic edema   Diabetes mellitus, type 2 *Controlled here-will check hemoglobin A1c-CBGs greater than 300 at presentation *Continue Lantus and sliding scale insulin   HYPERLIPIDEMIA *Continue statin   GERD *Will discontinue Protonix in favor of H2 blocker since core antibiotics and wish to decrease risk of C. difficile colitis *GERD appears to be secondary to bowel dysmotility so patient has been instructed on eating 5-6 small meals daily   Hyperkalemia *Likely related to dehydration and the Aldactone the patient was taking prior to admission   Constipation, chronic/colonic dysfunction-Ogilvie's syndrome *Multiple influencing factors: Diuretic use, beta blocker use, oral iron use, diabetes with likely gastroparesis, and decreased mobility *KUB reveals dilated bowel and retained stool *Mag citrate and soap suds enema ineffective * start go-lytely today   Warfarin anticoagulation *For paroxysmal atrial  fibrillation -Coumadin on hold for pacer placement   Obesity/OSA (obstructive sleep apnea)-noncompliant with CPAP *  Bradycardia and sinus pauses occurring while patient was awake so doubtful sleep apnea contributing to the symptoms   DVT prophylaxis: heparin Code Status: Full Family Communication: Spoke directly to patient and his daughters at the bedside Disposition Plan: Remain in step down unit  Consultants: Cardiology Electrophysiology  Procedures: None  Antibiotics: Rocephin 11/6 >>>  HPI/Subjective: Patient states that she hasn't had a BM as of yet. No other complaints.    Objective: Blood pressure 168/68, pulse 60, temperature 98.2 F (36.8 C), temperature source Oral, resp. rate 18, height 5' 10.5" (1.791 m), weight 119.8 kg (264 lb 1.8 oz), SpO2 94.00%.  Intake/Output Summary (Last 24 hours) at 06/01/12 1936 Last data filed at 06/01/12 1830  Gross per 24 hour  Intake   2596 ml  Output   1400 ml  Net   1196 ml     Exam: General: No acute respiratory distress Lungs: Clear to auscultation bilaterally without wheezes or crackles, nasal cannula oxygen with saturations 100% Cardiovascular: Irregular rate and rhythm without murmur gallop or rub normal S1 and S2-mostly in accelerated junctional rhythm  Abdomen: Nontender, nondistended, soft, bowel sounds positive, no rebound, no ascites, no appreciable mass Musculoskeletal: No significant cyanosis, clubbing of bilateral lower extremities Neurological: Patient is awake and alert, oriented times name place and time of day but not to year. Moves all extremities x4 weekly, exam non-focal  Data Reviewed: Basic Metabolic Panel:  Lab 06/01/12 4098 05/30/12 1428 05/30/12 0125 05/29/12 1817 05/29/12 1254 05/29/12 1240  NA 138 136 135 136 137 --  K 4.7 4.5 5.2* 5.1 5.8* --  CL 103 102 104 105 107 --  CO2 27 24 23 22  -- 22  GLUCOSE 255* 194* 232* 125* 302* --  BUN 16 18 23 20 19  --  CREATININE 1.33 1.53* 1.94* 1.83*  1.60* --  CALCIUM 8.7 8.1* 7.8* 8.6 -- 8.5  MG -- -- -- -- -- 2.2  PHOS -- -- -- -- -- --   Liver Function Tests:  Lab 05/30/12 0125 05/29/12 1240  AST 22 45*  ALT 34 44  ALKPHOS 76 90  BILITOT 0.2* 0.2*  PROT 5.8* 6.7  ALBUMIN 2.7* 3.1*   No results found for this basename: LIPASE:5,AMYLASE:5 in the last 168 hours No results found for this basename: AMMONIA:5 in the last 168 hours CBC:  Lab 05/30/12 0125 05/29/12 1254 05/29/12 1240  WBC 7.9 -- 8.0  NEUTROABS -- -- 6.2  HGB 9.9* 11.6* 11.2*  HCT 29.4* 34.0* 34.0*  MCV 88.6 -- 88.5  PLT 154 -- 170   Cardiac Enzymes:  Lab 05/30/12 0008 05/29/12 1818 05/29/12 1240  CKTOTAL 57 65 --  CKMB 1.8 2.0 --  CKMBINDEX -- -- --  TROPONINI <0.30 <0.30 <0.30   BNP (last 3 results) No results found for this basename: PROBNP:3 in the last 8760 hours CBG:  Lab 06/01/12 1711 06/01/12 1158 06/01/12 0753 05/31/12 2122 05/31/12 1716  GLUCAP 292* 299* 218* 267* 305*    Recent Results (from the past 240 hour(s))  URINE CULTURE     Status: Normal   Collection Time   05/29/12  2:38 PM      Component Value Range Status Comment   Specimen Description URINE, CLEAN CATCH   Final    Special Requests NONE   Final    Culture  Setup Time 05/29/2012 15:43   Final    Colony Count >=100,000 COLONIES/ML   Final    Culture KLEBSIELLA PNEUMONIAE   Final    Report  Status 05/31/2012 FINAL   Final    Organism ID, Bacteria KLEBSIELLA PNEUMONIAE   Final   MRSA PCR SCREENING     Status: Normal   Collection Time   05/29/12  5:38 PM      Component Value Range Status Comment   MRSA by PCR NEGATIVE  NEGATIVE Final   CULTURE, BLOOD (ROUTINE X 2)     Status: Normal (Preliminary result)   Collection Time   05/31/12 12:16 PM      Component Value Range Status Comment   Specimen Description BLOOD RIGHT THUMB   Final    Special Requests BOTTLES DRAWN AEROBIC ONLY 5.5CC   Final    Culture  Setup Time 05/31/2012 18:57   Final    Culture     Final    Value:         BLOOD CULTURE RECEIVED NO GROWTH TO DATE CULTURE WILL BE HELD FOR 5 DAYS BEFORE ISSUING A FINAL NEGATIVE REPORT   Report Status PENDING   Incomplete   CULTURE, BLOOD (ROUTINE X 2)     Status: Normal (Preliminary result)   Collection Time   05/31/12 12:27 PM      Component Value Range Status Comment   Specimen Description BLOOD LEFT HAND   Final    Special Requests BOTTLES DRAWN AEROBIC ONLY 6CC   Final    Culture  Setup Time 05/31/2012 18:57   Final    Culture     Final    Value:        BLOOD CULTURE RECEIVED NO GROWTH TO DATE CULTURE WILL BE HELD FOR 5 DAYS BEFORE ISSUING A FINAL NEGATIVE REPORT   Report Status PENDING   Incomplete      Studies:  Recent x-ray studies have been reviewed in detail by the Attending Physician  Scheduled Meds:  Reviewed in detail by the Attending Physician   Calvert Cantor, MD [phone]  If 7PM-7AM, please contact night-coverage [email or web address] Password [email or web address] 06/01/2012, 7:36 PM   LOS: 3 days

## 2012-06-01 NOTE — Progress Notes (Signed)
Patient ID: Tyler [name], male   DOB: Oct 09, 1933, 78 y.o.   MRN: [id number] Subjective: No CP or SOB. Objective: Filed Vitals:   06/01/12 0500 06/01/12 0542 06/01/12 0751 06/01/12 0836  BP:  155/55    Pulse:      Temp:   98.1 F (36.7 C)   TempSrc:   Oral   Resp:   18   Height:      Weight: 264 lb 1.8 oz (119.8 kg)     SpO2:   92% 93%   Weight change: -1 lb 12.2 oz (-0.8 kg)  Intake/Output Summary (Last 24 hours) at 06/01/12 1127 Last data filed at 06/01/12 0500  Gross per 24 hour  Intake    456 ml  Output    850 ml  Net   -394 ml    General: Alert, awake, oriented x3, in no acute distress Neck:  JVP is difficult to assess Heart: Regular rate and rhythm, without murmurs, rubs, gallops.  Lungs: Clear to auscultation.  No rales or wheezes. Exemities:  No edema.   Neuro: Grossly intact, nonfocal.  Tele:  ? SR and some AFIB  Difficult to see pwaves.  HR 70s. Lab Results: Results for orders placed during the hospital encounter of 05/29/12 (from the past 24 hour(s))  GLUCOSE, CAPILLARY     Status: Abnormal   Collection Time   05/31/12 12:37 PM      Component Value Range   Glucose-Capillary 242 (*) 70 - 99 mg/dL  GLUCOSE, CAPILLARY     Status: Abnormal   Collection Time   05/31/12  5:16 PM      Component Value Range   Glucose-Capillary 305 (*) 70 - 99 mg/dL  GLUCOSE, CAPILLARY     Status: Abnormal   Collection Time   05/31/12  9:22 PM      Component Value Range   Glucose-Capillary 267 (*) 70 - 99 mg/dL  BASIC METABOLIC PANEL     Status: Abnormal   Collection Time   06/01/12  5:05 AM      Component Value Range   Sodium 138  135 - 145 mEq/L   Potassium 4.7  3.5 - 5.1 mEq/L   Chloride 103  96 - 112 mEq/L   CO2 27  19 - 32 mEq/L   Glucose, Bld 255 (*) 70 - 99 mg/dL   BUN 16  6 - 23 mg/dL   Creatinine, Ser 4.01  0.50 - 1.35 mg/dL   Calcium 8.7  8.4 - 02.7 mg/dL   GFR calc non Af Amer 50 (*) >90 mL/min   GFR calc Af Amer 58 (*) >90 mL/min  GLUCOSE, CAPILLARY      Status: Abnormal   Collection Time   06/01/12  7:53 AM      Component Value Range   Glucose-Capillary 218 (*) 70 - 99 mg/dL   Comment 1 Notify RN      Medications: Reviwed   Patient Active Hospital Problem List: HYPERLIPIDEMIA (11/26/2009)   HYPERTENSION (11/26/2009)   Assessment: BP is good   ATRIAL FIBRILLATION, PAROXYSMAL (11/26/2009)   Assessment:  In NSR some of his rhythm seems to be accelerated junctional  Bradycardia (05/29/2012)   Assessment: Tele now showing NSR rates 80's  Watch over weekend and Rx UTI Dr [name] to see Monday regarding pacer  Renal insufficiency (05/29/2012  hyperkalemia (05/29/2012)      LOS: 3 days   Charlton Haws 06/01/2012, 11:27 AM

## 2012-06-02 ENCOUNTER — Inpatient Hospital Stay (HOSPITAL_COMMUNITY): Payer: Medicare Other

## 2012-06-02 DIAGNOSIS — K5669 Other intestinal obstruction: Secondary | ICD-10-CM

## 2012-06-02 DIAGNOSIS — K5981 Ogilvie syndrome: Secondary | ICD-10-CM | POA: Diagnosis present

## 2012-06-02 LAB — GLUCOSE, CAPILLARY
Glucose-Capillary: 306 mg/dL — ABNORMAL HIGH (ref 70–99)
Glucose-Capillary: 234 mg/dL — ABNORMAL HIGH (ref 70–99)
Glucose-Capillary: 165 mg/dL — ABNORMAL HIGH (ref 70–99)

## 2012-06-02 LAB — BASIC METABOLIC PANEL
CO2: 27 mEq/L (ref 19–32)
Calcium: 8.7 mg/dL (ref 8.4–10.5)
Creatinine, Ser: 1.35 mg/dL (ref 0.50–1.35)
GFR calc Af Amer: 57 mL/min — ABNORMAL LOW (ref >90)
GFR calc non Af Amer: 49 mL/min — ABNORMAL LOW (ref >90)
Sodium: 140 mEq/L (ref 135–145)

## 2012-06-02 MED ORDER — INSULIN GLARGINE 100 UNIT/ML ~~LOC~~ SOLN: 30.0000 [IU] | Freq: Two times a day (BID) | SUBCUTANEOUS | Status: DC

## 2012-06-02 MED ORDER — INSULIN GLARGINE 100 UNIT/ML ~~LOC~~ SOLN: 39.0000 [IU] | Freq: Two times a day (BID) | SUBCUTANEOUS | Status: DC

## 2012-06-02 MED ORDER — INSULIN GLARGINE 100 UNIT/ML ~~LOC~~ SOLN
30.0000 [IU] | Freq: Two times a day (BID) | SUBCUTANEOUS | Status: DC
  Administered 2012-06-03: 30 [IU] via SUBCUTANEOUS

## 2012-06-02 MED ORDER — INSULIN ASPART 100 UNIT/ML ~~LOC~~ SOLN
0.0000 [IU] | Freq: Three times a day (TID) | SUBCUTANEOUS | Status: DC
  Administered 2012-06-02: 15 [IU] via SUBCUTANEOUS
  Administered 2012-06-02: 7 [IU] via SUBCUTANEOUS
  Administered 2012-06-03: 11 [IU] via SUBCUTANEOUS
  Administered 2012-06-03: 20 [IU] via SUBCUTANEOUS
  Administered 2012-06-03: 11 [IU] via SUBCUTANEOUS
  Administered 2012-06-04 (×3): 7 [IU] via SUBCUTANEOUS
  Administered 2012-06-05 – 2012-06-06 (×2): 3 [IU] via SUBCUTANEOUS

## 2012-06-02 MED ORDER — INSULIN ASPART 100 UNIT/ML ~~LOC~~ SOLN
0.0000 [IU] | Freq: Every day | SUBCUTANEOUS | Status: DC
  Administered 2012-06-06: 2 [IU] via SUBCUTANEOUS

## 2012-06-02 NOTE — Progress Notes (Signed)
Patient ID: MONTEL VANDERHOOF, male   DOB: 11/16/1933, 78 y.o.   MRN: [id number] Subjective: No CP or SOB. Indigestion a bit better  Objective: Filed Vitals:   06/02/12 0300 06/02/12 0513 06/02/12 0521 06/02/12 0830  BP:  166/65    Pulse:      Temp: 98.5 F (36.9 C)   97.7 F (36.5 C)  TempSrc: Oral   Oral  Resp:    18  Height:      Weight:   267 lb 13.7 oz (121.5 kg)   SpO2:  99%  94%   Weight change: 3 lb 12 oz (1.7 kg)  Intake/Output Summary (Last 24 hours) at 06/02/12 0904 Last data filed at 06/02/12 0500  Gross per 24 hour  Intake   2410 ml  Output   1000 ml  Net   1410 ml    General: Alert, awake, oriented x3, in no acute distress Neck:  JVP is difficult to assess Heart: Regular rate and rhythm, without murmurs, rubs, gallops.  Lungs: Clear to auscultation.  No rales or wheezes. Exemities:  No edema.   Neuro: Grossly intact, nonfocal.  Tele:  ? SR and some AFIB  Difficult to see pwaves.  HR 70s.  One 3.8 second pause separated by a PVC Lab Results: Results for orders placed during the hospital encounter of 05/29/12 (from the past 24 hour(s))  GLUCOSE, CAPILLARY     Status: Abnormal   Collection Time   06/01/12 11:58 AM      Component Value Range   Glucose-Capillary 299 (*) 70 - 99 mg/dL   Comment 1 Notify RN    GLUCOSE, CAPILLARY     Status: Abnormal   Collection Time   06/01/12  5:11 PM      Component Value Range   Glucose-Capillary 292 (*) 70 - 99 mg/dL   Comment 1 Notify RN    GLUCOSE, CAPILLARY     Status: Abnormal   Collection Time   06/01/12  9:20 PM      Component Value Range   Glucose-Capillary 252 (*) 70 - 99 mg/dL  BASIC METABOLIC PANEL     Status: Abnormal   Collection Time   06/02/12  5:00 AM      Component Value Range   Sodium 140  135 - 145 mEq/L   Potassium 4.6  3.5 - 5.1 mEq/L   Chloride 105  96 - 112 mEq/L   CO2 27  19 - 32 mEq/L   Glucose, Bld 273 (*) 70 - 99 mg/dL   BUN 15  6 - 23 mg/dL   Creatinine, Ser 2.95  0.50 - 1.35 mg/dL   Calcium 8.7  8.4 - 28.4 mg/dL   GFR calc non Af Amer 49 (*) >90 mL/min   GFR calc Af Amer 57 (*) >90 mL/min  GLUCOSE, CAPILLARY     Status: Abnormal   Collection Time   06/02/12  8:30 AM      Component Value Range   Glucose-Capillary 245 (*) 70 - 99 mg/dL   Comment 1 Notify RN      Medications: Reviwed   Patient Active Hospital Problem List: HYPERLIPIDEMIA (11/26/2009)   HYPERTENSION (11/26/2009)   Assessment: BP is good   ATRIAL FIBRILLATION, PAROXYSMAL (11/26/2009)   Assessment:  In NSR some of his rhythm seems to be accelerated junctional  Will repeat ECG  Bradycardia (05/29/2012)   Assessment: Tele now showing NSR rates 80's  Watch over weekend and Rx UTI Dr [name] to see Monday regarding  pacer Last ECG done 11/6 suggests advanced conduction disease.  When in junctional rhythm had RBBB/LAFB  Given his propensity for Tachybrady and PAF may very well benefit from pacer  Renal insufficiency (05/29/2012  hyperkalemia (05/29/2012)      LOS: 4 days   Charlton Haws 06/02/2012, 9:04 AM

## 2012-06-02 NOTE — Progress Notes (Signed)
Hear rates have settled out at this point off of coreg.  He is in sinus 80s/90s.  I dont see any prolonged pauses or significant AV block.  He continues to have some dizziness despite normal heart rates. He has marked abdominal distension for which Dr Butler Denmark will discuss with GI.  I will follow Presently, the likelihood of pacemaker implantation seems low.  Fayrene Fearing Amandajo Gonder,MD

## 2012-06-02 NOTE — Progress Notes (Signed)
PA-C Callaghan paged and informed of B/P 166/65 HR 83. Scheduled Hydralazine 10 mg administered  will continue same.

## 2012-06-02 NOTE — Progress Notes (Addendum)
[facility] Progress Note [place] TEAM 1 - Stepdown/ICU TEAM   Tyler [name] BJY:[id number] DOB: 1934/03/24 DOA: 05/29/2012 PCP: [name], MD  Brief narrative: Patient is a 78 year old black male resident of an assisted living facility, history of chronic diastolic dysfunction history of paroxysmal atrial fibrillation on chronic Coumadin therapy, history of sick sinus syndrome, hypertension, diabetes who was transferred from the skilled nursing facility for evaluation of the above-noted complaints. Upon evaluation by EMS in the field, he was found to be bradycardic. ECG revealed a junctional rhythm. Patient denied any ongoing dizziness, but he claimed that he has been having intermittent dizziness, exertional dyspnea, headache, abdominal bloating intermittently for the past few months. On the morning of admission he claimed that he had some left sided chest pain, that he cannot describe very well. There was no radiation of the pain. It was not clear whether this chest pain was associated with shortness of breath or nausea. He claimed that he could only ambulate around 10-15 feet before he gets short of breath, over the past few days he did claim that he gets lightheaded when he walks. He denied any fever, he denies any cough. He denies any vomiting. Denied any diarrhea. In the ED he was found to have a junctional rhythm, cardiology has already evaluated the patient. He also was found to have hyperkalemia. He was admitted to the step down unit for further monitoring and treatment.   Assessment/Plan: Principal Problem:  Symptomatic Bradycardia: Junctional rhythm-known  ATRIAL FIBRILLATION, PAROXYSMAL *Management per cardiology *Continue to hold carvedilol  *Daughter's report patient had similar problem back in 2010 and was evaluated by cardiologist in [place] who recommended a pacemaker- pt unfortunately was not able to follow up with him *History of cardiac catheterization in 2003  without any evidence of coronary disease - d/w Dr [name] today- he plans to hold off on Pacemaker as bradycardia and pauses have resolve for now- he has discussed this with the patient's daughters.   Active Problems:  HYPERTENSION  IV hydralazine has been started   CKD (chronic kidney disease) stage 3, GFR 30-59 ml/min *Likely has a degree of mild acute renal insufficiency do to dehydration and diminished perfusion related to ongoing symptomatic bradycardia - now improved   Klebsiella UTI (lower urinary tract infection)/Leukocytosis *Urine culture demonstrates ampicillin resistant Klebsiella *Continue Rocephin *blood cultures ordered on 11/9- negative thus far   Dehydration *Patient was on 2 diuretics prior to admission-these remain on hold *has been hydrated via IVF- now discontinued *Echocardiogram this admission reveals normal systolic function and parameters consistent with grade 2 diastolic dysfunction.  *Discussed with patient's family and they are unaware of any previous heart failure diagnosis or issues with chronic edema   Diabetes mellitus, type 2 *-CBGs still greater than 300 - a1c 6.7 *Continue Lantus- will increase to 30 BId (he takes 44 BID at home) and high dose sliding scale insulin   HYPERLIPIDEMIA *Continue statin   GERD *Will discontinue Protonix in favor of H2 blocker since core antibiotics and wish to decrease risk of C. difficile colitis *GERD appears to be secondary to bowel dysmotility so patient has been instructed on eating 5-6 small meals daily   Hyperkalemia *Likely related to dehydration and the Aldactone the patient was taking prior to admission   Constipation, chronic/colonic dysfunction-Ogilvie's syndrome *Multiple influencing factors: Diuretic use, beta blocker use, oral iron use, diabetes with likely gastroparesis, and decreased mobility *KUB reveals dilated bowel and retained stool-Mag citrate and soap suds enema ineffective-given go-lytely -  xray today reveals that there is not much residual stool but continued gaseous distension * will try rectal tube- d/w Dr Arlyce Dice- GI   Warfarin anticoagulation *For paroxysmal atrial fibrillation -Coumadin on hold for pacer placement   Obesity/OSA (obstructive sleep apnea)-noncompliant with CPAP *Bradycardia and sinus pauses occurred while patient was awake so doubtful sleep apnea contributing to the symptoms   DVT prophylaxis: heparin Code Status: Full Family Communication: Spoke directly to patient  Disposition Plan: Remain in step down unit  Consultants: Cardiology Electrophysiology  Procedures: None  Antibiotics: Rocephin 11/6 >>>  HPI/Subjective: Patient had one very large BM today.    Objective: Blood pressure 134/55, pulse 91, temperature 98.1 F (36.7 C), temperature source Oral, resp. rate 18, height 5' 10.5" (1.791 m), weight 121.5 kg (267 lb 13.7 oz), SpO2 94.00%.  Intake/Output Summary (Last 24 hours) at 06/02/12 1600 Last data filed at 06/02/12 1300  Gross per 24 hour  Intake   2650 ml  Output    800 ml  Net   1850 ml     Exam: General: No acute respiratory distress Lungs: Clear to auscultation bilaterally without wheezes or crackles, nasal cannula oxygen with saturations 100% Cardiovascular: Irregular rate and rhythm without murmur gallop or rub normal S1 and S2-NSR Abdomen: Nontender, very distended and tympanic, firm, bowel sounds positive, no rebound, no ascites, no appreciable mass Musculoskeletal: No significant cyanosis, clubbing of bilateral lower extremities Neurological: Patient is awake and alert, oriented times name place and time of day but not to year. Moves all extremities x4 weekly, exam non-focal  Data Reviewed: Basic Metabolic Panel:  Lab 06/02/12 1610 06/01/12 0505 05/30/12 1428 05/30/12 0125 05/29/12 1817 05/29/12 1240  NA 140 138 136 135 136 --  K 4.6 4.7 4.5 5.2* 5.1 --  CL 105 103 102 104 105 --  CO2 27 27 24 23 22  --    GLUCOSE 273* 255* 194* 232* 125* --  BUN 15 16 18 23 20  --  CREATININE 1.35 1.33 1.53* 1.94* 1.83* --  CALCIUM 8.7 8.7 8.1* 7.8* 8.6 --  MG -- -- -- -- -- 2.2  PHOS -- -- -- -- -- --   Liver Function Tests:  Lab 05/30/12 0125 05/29/12 1240  AST 22 45*  ALT 34 44  ALKPHOS 76 90  BILITOT 0.2* 0.2*  PROT 5.8* 6.7  ALBUMIN 2.7* 3.1*   No results found for this basename: LIPASE:5,AMYLASE:5 in the last 168 hours No results found for this basename: AMMONIA:5 in the last 168 hours CBC:  Lab 05/30/12 0125 05/29/12 1254 05/29/12 1240  WBC 7.9 -- 8.0  NEUTROABS -- -- 6.2  HGB 9.9* 11.6* 11.2*  HCT 29.4* 34.0* 34.0*  MCV 88.6 -- 88.5  PLT 154 -- 170   Cardiac Enzymes:  Lab 05/30/12 0008 05/29/12 1818 05/29/12 1240  CKTOTAL 57 65 --  CKMB 1.8 2.0 --  CKMBINDEX -- -- --  TROPONINI <0.30 <0.30 <0.30   BNP (last 3 results) No results found for this basename: PROBNP:3 in the last 8760 hours CBG:  Lab 06/02/12 1134 06/02/12 0830 06/01/12 2120 06/01/12 1711 06/01/12 1158  GLUCAP 306* 245* 252* 292* 299*    Recent Results (from the past 240 hour(s))  URINE CULTURE     Status: Normal   Collection Time   05/29/12  2:38 PM      Component Value Range Status Comment   Specimen Description URINE, CLEAN CATCH   Final    Special Requests NONE   Final  Culture  Setup Time 05/29/2012 15:43   Final    Colony Count >=100,000 COLONIES/ML   Final    Culture KLEBSIELLA PNEUMONIAE   Final    Report Status 05/31/2012 FINAL   Final    Organism ID, Bacteria KLEBSIELLA PNEUMONIAE   Final   MRSA PCR SCREENING     Status: Normal   Collection Time   05/29/12  5:38 PM      Component Value Range Status Comment   MRSA by PCR NEGATIVE  NEGATIVE Final   CULTURE, BLOOD (ROUTINE X 2)     Status: Normal (Preliminary result)   Collection Time   05/31/12 12:16 PM      Component Value Range Status Comment   Specimen Description BLOOD RIGHT THUMB   Final    Special Requests BOTTLES DRAWN AEROBIC ONLY  5.5CC   Final    Culture  Setup Time 05/31/2012 18:57   Final    Culture     Final    Value:        BLOOD CULTURE RECEIVED NO GROWTH TO DATE CULTURE WILL BE HELD FOR 5 DAYS BEFORE ISSUING A FINAL NEGATIVE REPORT   Report Status PENDING   Incomplete   CULTURE, BLOOD (ROUTINE X 2)     Status: Normal (Preliminary result)   Collection Time   05/31/12 12:27 PM      Component Value Range Status Comment   Specimen Description BLOOD LEFT HAND   Final    Special Requests BOTTLES DRAWN AEROBIC ONLY 6CC   Final    Culture  Setup Time 05/31/2012 18:57   Final    Culture     Final    Value:        BLOOD CULTURE RECEIVED NO GROWTH TO DATE CULTURE WILL BE HELD FOR 5 DAYS BEFORE ISSUING A FINAL NEGATIVE REPORT   Report Status PENDING   Incomplete      Studies:  Recent x-ray studies have been reviewed in detail by the Attending Physician  Scheduled Meds:  Reviewed in detail by the Attending Physician   Calvert Cantor, MD [phone]  If 7PM-7AM, please contact night-coverage [email or web address] Password [email or web address] 06/02/2012, 4:00 PM   LOS: 4 days

## 2012-06-03 ENCOUNTER — Inpatient Hospital Stay (HOSPITAL_COMMUNITY): Payer: Medicare Other

## 2012-06-03 ENCOUNTER — Encounter (HOSPITAL_COMMUNITY): Payer: Self-pay

## 2012-06-03 DIAGNOSIS — E119 Type 2 diabetes mellitus without complications: Secondary | ICD-10-CM

## 2012-06-03 LAB — BASIC METABOLIC PANEL
Chloride: 101 mEq/L (ref 96–112)
GFR calc Af Amer: 54 mL/min — ABNORMAL LOW (ref >90)
Potassium: 4.5 mEq/L (ref 3.5–5.1)
Sodium: 136 mEq/L (ref 135–145)

## 2012-06-03 LAB — GLUCOSE, CAPILLARY: Glucose-Capillary: 172 mg/dL — ABNORMAL HIGH (ref 70–99)

## 2012-06-03 MED ORDER — HYDRALAZINE HCL 10 MG PO TABS
10.0000 mg | ORAL_TABLET | Freq: Four times a day (QID) | ORAL | Status: DC
  Administered 2012-06-03 – 2012-06-04 (×3): 10 mg via ORAL
  Filled 2012-06-03 (×7): qty 1

## 2012-06-03 MED ORDER — WARFARIN SODIUM 10 MG PO TABS
10.0000 mg | ORAL_TABLET | Freq: Once | ORAL | Status: AC
  Administered 2012-06-03: 10 mg via ORAL
  Filled 2012-06-03: qty 1

## 2012-06-03 MED ORDER — WARFARIN - PHARMACIST DOSING INPATIENT
Freq: Every day | Status: DC
  Administered 2012-06-03: 18:00:00

## 2012-06-03 MED ORDER — INSULIN GLARGINE 100 UNIT/ML ~~LOC~~ SOLN
44.0000 [IU] | Freq: Two times a day (BID) | SUBCUTANEOUS | Status: DC
  Administered 2012-06-03 – 2012-06-06 (×7): 44 [IU] via SUBCUTANEOUS

## 2012-06-03 MED ORDER — POLYETHYLENE GLYCOL 3350 17 G PO PACK
17.0000 g | PACK | Freq: Two times a day (BID) | ORAL | Status: DC
  Administered 2012-06-04 – 2012-06-10 (×13): 17 g via ORAL
  Filled 2012-06-03 (×15): qty 1

## 2012-06-03 NOTE — Clinical Social Work Note (Signed)
CSW received report on Pt from [facility] CS[facility]. [facility] is from [facility] Pt will need PT evaluation to determine any rehabilitation needs at dc.  CSW will continue to follow as needed.    Frederico Hamman, LCSW [phone]

## 2012-06-03 NOTE — Progress Notes (Addendum)
Cardiology Progress Note Patient Name: Tyler [name] Date of Encounter: 06/03/2012, 6:23 AM     Subjective  No overnight events. Denies chest pain, palpitations or sob. C/o abd tightness.   Objective   Telemetry: sinus rhythm w/ 1st degree AVB 70-90s w/ PAF 120s, no prolonged pauses  Medications: . aspirin EC  81 mg Oral Daily  . cefTRIAXone (ROCEPHIN)  IV  1 g Intravenous Q24H  . cromolyn  1 drop Both Eyes BID  . famotidine  40 mg Oral BID  . ferrous sulfate  325 mg Oral Q breakfast  . heparin subcutaneous  5,000 Units Subcutaneous Q8H  . hydrALAZINE  10 mg Intravenous Q6H  . insulin aspart  0-20 Units Subcutaneous TID WC  . insulin aspart  0-5 Units Subcutaneous QHS  . insulin glargine  30 Units Subcutaneous BID  . ipratropium  2 spray Nasal TID  . mometasone-formoterol  2 puff Inhalation BID  . multivitamin with minerals  1 tablet Oral Daily  . neomycin-bacitracin-polymyxin  1 application Topical Daily  . polyethylene glycol  17 g Oral Daily  . sertraline  25 mg Oral Daily  . simvastatin  20 mg Oral QHS  . sodium chloride  1 spray Nasal QID  . sodium chloride  3 mL Intravenous Q12H  . trolamine salicylate  1 application Topical TID      Physical Exam: Temp:  [97.2 F (36.2 C)-99 F (37.2 C)] 99 F (37.2 C) (11/11 0341) Pulse Rate:  [84-98] 95  (11/11 0341) Resp:  [14-20] 20  (11/11 0341) BP: (125-175)/(54-65) 136/54 mmHg (11/11 0546) SpO2:  [93 %-94 %] 93 % (11/11 0341) Weight:  [268 lb 15.4 oz (122 kg)] 268 lb 15.4 oz (122 kg) (11/11 0500)  General: Obese elderly male in no acute distress.  Head: Normocephalic, atraumatic, sclera non-icteric, nares are without discharge  Neck: Supple. Negative for carotid bruits. JVP difficult to assess due to body habitus.  Lungs: Clear bilaterally to auscultation without wheezes, rales, or rhonchi. Breathing is unlabored.  Heart: RRR with S1 S2. No murmurs, rubs, or gallops appreciated.  Abdomen: Obese, Soft,  nontender, distended with hyperactive bowel sounds. No rebound/guarding. No obvious abdominal masses.  Msk: Strength and tone appear normal for age.  Extremities: Trace bilat ankle edema. No clubbing or cyanosis. Distal pedal pulses are intact and equal bilaterally.  Neuro: Alert and oriented X 3. Moves all extremities spontaneously.  Psych: Responds to questions appropriately with a normal affect.    Intake/Output Summary (Last 24 hours) at 06/03/12 0623 Last data filed at 06/02/12 2300  Gross per 24 hour  Intake    770 ml  Output      1 ml  Net    769 ml    Labs:  [facility] 06/02/12 0500 06/01/12 0505  NA 140 138  K 4.6 4.7  CL 105 103  CO2 27 27  GLUCOSE 273* 255*  BUN 15 16  CREATININE 1.35 1.33  CALCIUM 8.7 8.7    05/31/2012 09:18  Prothrombin Time 17.4 (H)  INR 1.47     06/01/2012 05:05  TSH 0.221 (L)  Free T4 1.18   Radiology/Studies:   05/30/12 - Echo Study Conclusions: - Left ventricle: The cavity size was normal. Wall thickness was increased in a pattern of mild LVH. Systolic function was normal. The estimated ejection fraction was in the range of 60% to 65%. Wall motion was normal; there were no regional wall motion abnormalities. Definity echo contrast  was used. Features are consistent with a pseudonormal left ventricular filling pattern, with concomitant abnormal relaxation and increased filling pressure (grade 2 diastolic dysfunction). - Aortic valve: There was no stenosis. - Mitral valve: Trivial regurgitation. - Left atrium: The atrium was mildly dilated. - Right ventricle: Poorly visualized. - Systemic veins: IVC measured 2.3 cm with normal respirophasic variation, suggesting RA pressure 6-66mmHg. Impressions:  Technically difficult study with poor acoustic windows. Normal LV size and systolic function with mild LV hypertrophy. EF 60-65%. Moderate diastolic dysfunction.  The RV was poorly visualized.  06/02/2012 - Abd Portable 1v Findings: There are  multiple air-filled loops of large and small bowel with continued prominence of a colonic loop in the left abdomen.  No definite free air or air-fluid levels.  Remainder of the exam is unchanged.  IMPRESSION: Continued air filled loops of large and small bowel with mild prominence of a colonic loop in the left abdomen.  Findings are likely due to ileus.  Recommend follow up as clinically indicated.     05/30/2012  - Abd Portable 1v  Findings: There are multiple gas-filled loops of bowel which appear very dilated.  These appear to be colonic.  There is some gas within the rectum.  The rectum is not dilated.  No significant small-bowel dilatation cannot be appreciated.  There is mild gaseous distention of the mid and distal portion of the stomach. Fecal burden is small.  No opaque calculi are seen.  There is degenerative spondylosis.  IMPRESSION: Multiple gas-filled loops of intestine which appear dilated and appear to be colonic.  There is gas within the nondilated rectum. The most distended loop is in the lower left abdomen.  It was distended on the previous study to a caliber of 8.6 cm.  On the current study it is distended up to caliber of 10 cm.  No colonic wall edema or pneumatosis is evident. Fecal burden appears small. Colonic partial obstruction cannot be excluded.     05/29/2012  - Abd Portable 2v Findings: Nonobstructive bowel gas pattern is present.  There is gaseous distention of colon in the left mid abdomen, likely within redundant sigmoid.  No gross plain film evidence of free air.  Prominent stool is present in the right colon.  Monitoring leads project over the lower chest along with a defibrillator pad.  Gas is present in the anatomic pelvis however the rectum is not visualized on these two views.  IMPRESSION: Gaseous distention of the colon.  Overall bowel gas pattern is nonobstructive.     05/29/2012 - Chest Port 1 View  Findings: Low volume chest.  Apical lordotic projection. Defibrillator  pads project over the chest.  Monitoring leads are also present on the chest.  Allowing for the low volumes of inspiration, the heart lungs appear within normal limits.  IMPRESSION: Low volume chest.     Assessment and Plan   1. Bradycardia  2. PAF/AFL on chronic coumadin 3. Hyperkalemia, resolved 4. UTI 5. Diabetes Mellitus, Type 2 Uncontrolled with Hyperglycemia 6. Chronic Diastolic CHF 7. Acute on CKD, improving 8. Hypertension 9. OSA 10.Abdominal distension  Telemetry shows 1st degree AVB with rates 70-90s. Will obtain EKG this morning. Bradycardia seems to be resolved, but he has underlying sinus node dysfunction as well as PAF.   Coumadin is currently on hold and INR <1.5 on 11/8.   Euvolemic on exam. Primary team managing other medical issues.   Signed, HOPE, JESSICA PA-C  I have seen, examined the patient, and reviewed the  above assessment and plan.  Changes to above are made where necessary.  EKG this am reveals sinus rhythm 90 bpm, with PR 182, and RBBBB.  No AV block or significant sinus pauses overnight.  His rhythm has improved off of coreg.  I think that at this point, we could avoid PPM. Primary team is addressing multiple other issues.  He could transfer to telemetry from my standpoint.  Co Sign: [name], MD 06/03/2012 9:26 AM

## 2012-06-03 NOTE — Progress Notes (Signed)
[facility] Progress Note [place] TEAM 1 - Stepdown/ICU TEAM   Tyler [name] WUJ:[id number] DOB: 03-26-1934 DOA: 05/29/2012 PCP: [name], MD  Brief narrative: Patient is a 78 year old black male resident of an assisted living facility, history of chronic diastolic dysfunction history of paroxysmal atrial fibrillation on chronic Coumadin therapy, history of sick sinus syndrome, hypertension, diabetes who was transferred from the skilled nursing facility for evaluation of the above-noted complaints. Upon evaluation by EMS in the field, he was found to be bradycardic. ECG revealed a junctional rhythm. Patient denied any ongoing dizziness, but he claimed that he has been having intermittent dizziness, exertional dyspnea, headache, abdominal bloating intermittently for the past few months. On the morning of admission he claimed that he had some left sided chest pain, that he cannot describe very well. There was no radiation of the pain. It was not clear whether this chest pain was associated with shortness of breath or nausea. He claimed that he could only ambulate around 10-15 feet before he gets short of breath, over the past few days he did claim that he gets lightheaded when he walks. He denied any fever, he denies any cough. He denies any vomiting. Denied any diarrhea. In the ED he was found to have a junctional rhythm, cardiology has already evaluated the patient. He also was found to have hyperkalemia. He was admitted to the step down unit for further monitoring and treatment.  Assessment/Plan:  Symptomatic Bradycardia: Junctional rhythm-known  ATRIAL FIBRILLATION, PAROXYSMAL *Management per cardiology/EP *Continue to hold carvedilol -unsure which is any rate controlling agents patient will be able to tolerate in the future *Currently EP does not plan to pursue pacemaker placement since heart rate has improved off medications *Daughter's report patient had similar problem back  in 2010 and was evaluated by cardiologist in [place] who recommended a pacemaker- pt unfortunately was not able to follow up with him *History of cardiac catheterization in 2003 without any evidence of coronary disease  HYPERTENSION  *IV hydralazine had been started - transition to oral hydralazine  CKD (chronic kidney disease) stage 3, GFR 30-59 ml/min *Likely has a degree of mild acute renal insufficiency due to dehydration and diminished perfusion related to ongoing symptomatic bradycardia prior to admission *Creatinine has remained stable at baseline ranges  Klebsiella UTI (lower urinary tract infection)/Leukocytosis *Urine culture demonstrates ampicillin resistant Klebsiella *Continue Rocephin to complete 10 day course  *blood cultures ordered on 11/9 - negative  Dehydration *Patient was on 2 diuretics prior to admission-these remain on hold *has been hydrated via IVF- now discontinued *Echocardiogram this admission reveals normal systolic function and parameters consistent with grade 2 diastolic dysfunction.  *Discussed with patient's family and they are unaware of any previous heart failure diagnosis or issues with chronic edema  Diabetes mellitus, type 2 *CBGs still greater than 250 - a1c 6.7 *Continue Lantus- was increased to 30 units twice a day on 06/02/2012 unfortunately CBG have remained high so will increase to home dose of 44 units twice a day  *Continue resistant sliding scale insulin  HYPERLIPIDEMIA *Continue statin  GERD *Will discontinue Protonix in favor of H2 blocker since on antibiotics and wish to decrease risk of C. difficile colitis *GERD appears to be secondary to bowel dysmotility so patient has been instructed on eating 5-6 small meals daily  Hyperkalemia *Likely related to dehydration and the Aldactone the patient was taking prior to admission  Constipation, chronic/colonic dysfunction-Ogilvie's syndrome *Multiple influencing factors: Diuretic use,  beta blocker use, oral iron  use, diabetes with likely gastroparesis, and decreased mobility *KUB reveals dilated bowel and retained stool-Mag citrate and soap suds enema ineffective-given go-lytely - xray today reveals that there is not much residual stool but continued gaseous distension * will try rectal tube- Dr.  Butler Denmark d/w Dr Kaplan/GI-unfortunately suspect this is a chronic problem that will not be rectified this admission  Warfarin anticoagulation *For paroxysmal atrial fibrillation *Coumadin was placed on hold for possible pacer placement - will resume  Obesity/OSA (obstructive sleep apnea)-noncompliant with CPAP *Bradycardia and sinus pauses occurred while patient was awake so doubtful sleep apnea contributing to the symptoms  DVT prophylaxis: heparin Code Status: Full Family Communication: Spoke directly to patient - daughters were not in room during our evaluation Disposition Plan: Transfer to telemetry  Consultants: Cardiology Electrophysiology  Procedures: None  Antibiotics: Rocephin 11/6 >>>  HPI/Subjective: Patient endorses continues to have small BMs. Appetite seems improved. No other complaints verbalized.  Denied CP or SOB.   Objective: Blood pressure 136/54, pulse 95, temperature 98.4 F (36.9 C), temperature source Oral, resp. rate 20, height 5' 10.5" (1.791 m), weight 122 kg (268 lb 15.4 oz), SpO2 93.00%.  Intake/Output Summary (Last 24 hours) at 06/03/12 1232 Last data filed at 06/03/12 0800  Gross per 24 hour  Intake    530 ml  Output    150 ml  Net    380 ml     Exam: General: No acute respiratory distress Lungs: Clear to auscultation bilaterally without wheezes or crackles, nasal cannula oxygen with saturations 100% Cardiovascular: Irregular rate and rhythm without murmur gallop or rub normal S1 and S2-NSR Abdomen: Nontender, very distended and tympanic, firm, bowel sounds positive, no rebound, no ascites, no appreciable mass Musculoskeletal:  No significant cyanosis, clubbing of bilateral lower extremities Neurological: Patient is awake and alert, oriented times name place and time of day but not to year. Moves all extremities x4 weekly, exam non-focal  Data Reviewed: Basic Metabolic Panel:  Lab 06/03/12 8119 06/02/12 0500 06/01/12 0505 05/30/12 1428 05/30/12 0125 05/29/12 1240  NA 136 140 138 136 135 --  K 4.5 4.6 4.7 4.5 5.2* --  CL 101 105 103 102 104 --  CO2 25 27 27 24 23  --  GLUCOSE 327* 273* 255* 194* 232* --  BUN 17 15 16 18 23  --  CREATININE 1.42* 1.35 1.33 1.53* 1.94* --  CALCIUM 8.6 8.7 8.7 8.1* 7.8* --  MG -- -- -- -- -- 2.2  PHOS -- -- -- -- -- --   Liver Function Tests:  Lab 05/30/12 0125 05/29/12 1240  AST 22 45*  ALT 34 44  ALKPHOS 76 90  BILITOT 0.2* 0.2*  PROT 5.8* 6.7  ALBUMIN 2.7* 3.1*   CBC:  Lab 05/30/12 0125 05/29/12 1254 05/29/12 1240  WBC 7.9 -- 8.0  NEUTROABS -- -- 6.2  HGB 9.9* 11.6* 11.2*  HCT 29.4* 34.0* 34.0*  MCV 88.6 -- 88.5  PLT 154 -- 170   Cardiac Enzymes:  Lab 05/30/12 0008 05/29/12 1818 05/29/12 1240  CKTOTAL 57 65 --  CKMB 1.8 2.0 --  CKMBINDEX -- -- --  TROPONINI <0.30 <0.30 <0.30   BNP (last 3 results) No results found for this basename: PROBNP:3 in the last 8760 hours CBG:  Lab 06/03/12 1114 06/03/12 0830 06/02/12 2108 06/02/12 1717 06/02/12 1134  GLUCAP 291* 300* 165* 234* 306*    Recent Results (from the past 240 hour(s))  URINE CULTURE     Status: Normal   Collection Time  05/29/12  2:38 PM      Component Value Range Status Comment   Specimen Description URINE, CLEAN CATCH   Final    Special Requests NONE   Final    Culture  Setup Time 05/29/2012 15:43   Final    Colony Count >=100,000 COLONIES/ML   Final    Culture KLEBSIELLA PNEUMONIAE   Final    Report Status 05/31/2012 FINAL   Final    Organism ID, Bacteria KLEBSIELLA PNEUMONIAE   Final   MRSA PCR SCREENING     Status: Normal   Collection Time   05/29/12  5:38 PM      Component Value Range  Status Comment   MRSA by PCR NEGATIVE  NEGATIVE Final   CULTURE, BLOOD (ROUTINE X 2)     Status: Normal (Preliminary result)   Collection Time   05/31/12 12:16 PM      Component Value Range Status Comment   Specimen Description BLOOD RIGHT THUMB   Final    Special Requests BOTTLES DRAWN AEROBIC ONLY 5.5CC   Final    Culture  Setup Time 05/31/2012 18:57   Final    Culture     Final    Value:        BLOOD CULTURE RECEIVED NO GROWTH TO DATE CULTURE WILL BE HELD FOR 5 DAYS BEFORE ISSUING A FINAL NEGATIVE REPORT   Report Status PENDING   Incomplete   CULTURE, BLOOD (ROUTINE X 2)     Status: Normal (Preliminary result)   Collection Time   05/31/12 12:27 PM      Component Value Range Status Comment   Specimen Description BLOOD LEFT HAND   Final    Special Requests BOTTLES DRAWN AEROBIC ONLY 6CC   Final    Culture  Setup Time 05/31/2012 18:57   Final    Culture     Final    Value:        BLOOD CULTURE RECEIVED NO GROWTH TO DATE CULTURE WILL BE HELD FOR 5 DAYS BEFORE ISSUING A FINAL NEGATIVE REPORT   Report Status PENDING   Incomplete      Studies:  Recent x-ray studies have been reviewed in detail by the Attending Physician  Scheduled Meds:  Reviewed in detail by the Attending Physician   Junious Silk, ANP  [phone]  If 7PM-7AM, please contact night-coverage [email or web address] Password [email or web address] 06/03/2012, 12:32 PM   LOS: 5 days   I have personally examined this patient and reviewed the entire database. I have reviewed the above note, made any necessary editorial changes, and agree with its content.  Lonia Blood, MD [facility]

## 2012-06-03 NOTE — Progress Notes (Signed)
Received from 2900. Assessed per flowsheet. Denies chest pain or shortness of breath. C/o headache but refuses any PRN at this time. Call bell and family near. Tyler [name]

## 2012-06-03 NOTE — Progress Notes (Signed)
Inpatient Diabetes Program Recommendations  AACE/ADA: New Consensus Statement on Inpatient Glycemic Control (2013)  Target Ranges:  Prepandial:   less than 140 mg/dL      Peak postprandial:   less than 180 mg/dL (1-2 hours)      Critically ill patients:  140 - 180 mg/dL   Reason for Visit: Hyperglycemia Results for Tyler [name], Tyler [name] (MRN [id number]) as of 06/03/2012 14:35  Ref. Range 06/02/2012 11:34 06/02/2012 17:17 06/02/2012 21:08 06/03/2012 08:30 06/03/2012 11:14  Glucose-Capillary Latest Range: 70-99 mg/dL 409 (H) 811 (H) 914 (H) 300 (H) 291 (H)   Results for Tyler [name], Tyler [name] (MRN [id number]) as of 06/03/2012 14:35  Ref. Range 06/03/2012 05:00  Sodium Latest Range: 135-145 mEq/L 136  Potassium Latest Range: 3.5-5.1 mEq/L 4.5  Chloride Latest Range: 96-112 mEq/L 101  CO2 Latest Range: 19-32 mEq/L 25  BUN Latest Range: 6-23 mg/dL 17  Creatinine Latest Range: 0.50-1.35 mg/dL 0.86 (H)  Calcium Latest Range: 8.4-10.5 mg/dL 8.6  GFR calc non Af Amer Latest Range: >90 mL/min 46 (L)  GFR calc Af Amer Latest Range: >90 mL/min 54 (L)  Glucose Latest Range: 70-99 mg/dL 578 (H)    Inpatient Diabetes Program Recommendations Insulin - Meal Coverage: Add meal coverage insulin - Novolog 4 units tidwc if pt eats >50% meals  Note: Will continue to follow.

## 2012-06-03 NOTE — Progress Notes (Signed)
ANTICOAGULATION CONSULT NOTE - Initial Consult  Pharmacy Consult for Coumadin Indication: PAF  No Known Allergies  Labs:  Basename 06/03/12 0500 06/02/12 0500 06/01/12 0505  HGB -- -- --  HCT -- -- --  PLT -- -- --  APTT -- -- --  LABPROT -- -- --  INR -- -- --  HEPARINUNFRC -- -- --  CREATININE 1.42* 1.35 1.33  CKTOTAL -- -- --  CKMB -- -- --  TROPONINI -- -- --    Estimated Creatinine Clearance: 58.4 ml/min (by C-G formula based on Cr of 1.42).   Medical History: Past Medical History  Diagnosis Date  . Diabetes mellitus 1980  . Hyperlipidemia 1980  . Hypertension 1980  . Asthma   . Allergic rhinitis   . GERD (gastroesophageal reflux disease)   . BPH (benign prostatic hypertrophy)     s/p thermo therapy, has bladdero utlet obst. and instablitiy, incontient  . Anemia   . Chronic constipation   . Sick sinus syndrome   . Paroxysmal atrial fibrillation     status post direct cardioversion- normal cardiac cath 2003, with normal left ventricular function (done after an abnormal cardiolite)  . Bradycardia     severe, on high doese AV-nodal blockers  . Bifascicular block     chronic  . Obesity   . Sleep apnea     noncomplicance w/ CPAP  . Hypoxia     in the past, likely multifactorial  . Gastroparesis     dx in 2006 through gastric scan  . Gynecomastia     nomral mammograms 11/2008  . Shortness of breath   . Chronic kidney disease    Assessment: 78 year old male on Coumadin PTA for PAF, admitted for bradycardia, junctional rhythm.  Coumadin has been on hold for possible for pacemaker placement (which cardiology has now decided against), now to resume.  INR = 1.42  Dose PTA = 7.5 mg TTHSS, 10 mg MWF  Goal of Therapy:  INR 2-3 Monitor platelets by anticoagulation protocol: Yes   Plan:  1) Coumadin 10 mg po x 1 dose tonight 2) Daily INR  Thank you. Okey Regal, PharmD [phone]  06/03/2012,5:29 PM

## 2012-06-04 ENCOUNTER — Encounter (HOSPITAL_COMMUNITY): Payer: Self-pay | Admitting: Physician Assistant

## 2012-06-04 DIAGNOSIS — K3184 Gastroparesis: Secondary | ICD-10-CM

## 2012-06-04 DIAGNOSIS — R933 Abnormal findings on diagnostic imaging of other parts of digestive tract: Secondary | ICD-10-CM

## 2012-06-04 LAB — GLUCOSE, CAPILLARY
Glucose-Capillary: 201 mg/dL — ABNORMAL HIGH (ref 70–99)
Glucose-Capillary: 245 mg/dL — ABNORMAL HIGH (ref 70–99)
Glucose-Capillary: 222 mg/dL — ABNORMAL HIGH (ref 70–99)

## 2012-06-04 LAB — BASIC METABOLIC PANEL
BUN: 16 mg/dL (ref 6–23)
Chloride: 101 mEq/L (ref 96–112)
GFR calc Af Amer: 52 mL/min — ABNORMAL LOW (ref >90)
Potassium: 4.3 mEq/L (ref 3.5–5.1)

## 2012-06-04 MED ORDER — PANTOPRAZOLE SODIUM 40 MG IV SOLR
40.0000 mg | INTRAVENOUS | Status: DC
  Administered 2012-06-04 – 2012-06-06 (×3): 40 mg via INTRAVENOUS
  Filled 2012-06-04 (×4): qty 40

## 2012-06-04 MED ORDER — VITAMIN D (ERGOCALCIFEROL) 1.25 MG (50000 UNIT) PO CAPS
50000.0000 [IU] | ORAL_CAPSULE | ORAL | Status: DC
  Administered 2012-06-04: 50000 [IU] via ORAL
  Filled 2012-06-04: qty 1

## 2012-06-04 MED ORDER — DOXAZOSIN MESYLATE 4 MG PO TABS
4.0000 mg | ORAL_TABLET | Freq: Every day | ORAL | Status: DC
  Administered 2012-06-04 – 2012-06-05 (×2): 4 mg via ORAL
  Filled 2012-06-04 (×3): qty 1

## 2012-06-04 MED ORDER — CEPHALEXIN 500 MG PO CAPS
500.0000 mg | ORAL_CAPSULE | Freq: Three times a day (TID) | ORAL | Status: AC
  Administered 2012-06-04 – 2012-06-06 (×6): 500 mg via ORAL
  Filled 2012-06-04 (×6): qty 1

## 2012-06-04 MED ORDER — POLYVINYL ALCOHOL 1.4 % OP SOLN
2.0000 [drp] | OPHTHALMIC | Status: DC | PRN
  Filled 2012-06-04: qty 15

## 2012-06-04 MED ORDER — ADULT MULTIVITAMIN W/MINERALS CH
1.0000 | ORAL_TABLET | Freq: Every day | ORAL | Status: DC
  Administered 2012-06-04 – 2012-06-10 (×7): 1 via ORAL
  Filled 2012-06-04 (×7): qty 1

## 2012-06-04 MED ORDER — WARFARIN SODIUM 10 MG PO TABS
10.0000 mg | ORAL_TABLET | Freq: Once | ORAL | Status: AC
  Administered 2012-06-04: 10 mg via ORAL
  Filled 2012-06-04: qty 1

## 2012-06-04 MED ORDER — SENNA-DOCUSATE SODIUM 8.6-50 MG PO TABS: 2.0000 | ORAL_TABLET | Freq: Every day | ORAL | Status: DC

## 2012-06-04 MED ORDER — METOCLOPRAMIDE HCL 5 MG/ML IJ SOLN
10.0000 mg | Freq: Four times a day (QID) | INTRAMUSCULAR | Status: AC | PRN
  Filled 2012-06-04: qty 2

## 2012-06-04 MED ORDER — MOMETASONE FURO-FORMOTEROL FUM 100-5 MCG/ACT IN AERO
2.0000 | INHALATION_SPRAY | Freq: Two times a day (BID) | RESPIRATORY_TRACT | Status: DC
  Administered 2012-06-04 – 2012-06-10 (×11): 2 via RESPIRATORY_TRACT
  Filled 2012-06-04 (×2): qty 8.8

## 2012-06-04 MED ORDER — SIMETHICONE 80 MG PO CHEW
80.0000 mg | CHEWABLE_TABLET | Freq: Four times a day (QID) | ORAL | Status: DC
  Administered 2012-06-04 – 2012-06-05 (×5): 80 mg via ORAL
  Filled 2012-06-04 (×8): qty 1

## 2012-06-04 MED ORDER — HYPROMELLOSE (GONIOSCOPIC) 2.5 % OP SOLN
1.0000 [drp] | OPHTHALMIC | Status: DC | PRN
  Filled 2012-06-04: qty 15

## 2012-06-04 MED ORDER — SENNOSIDES-DOCUSATE SODIUM 8.6-50 MG PO TABS
2.0000 | ORAL_TABLET | Freq: Every day | ORAL | Status: DC
  Administered 2012-06-04 – 2012-06-10 (×7): 2 via ORAL
  Filled 2012-06-04 (×7): qty 2

## 2012-06-04 NOTE — Clinical Social Work Note (Signed)
CSW following to assist with dc planning. Pt is from [facility]. Pt was evaluated by PT/OT and advised CSW that ALF is appropriate at dc.  CSW faxed ALF clinical information from hospitalization and Elon Jester at ALF confirmed that they will plan on taking Pt back at dc.    CSW will continue to follow up as pt's care progresses.    Frederico Hamman, LCSW [phone]

## 2012-06-04 NOTE — Progress Notes (Signed)
ANTICOAGULATION CONSULT NOTE - Initial Consult  Pharmacy Consult for Coumadin Indication: PAF  No Known Allergies  Labs:  Basename 06/04/12 0615 06/03/12 0500 06/02/12 0500  HGB -- -- --  HCT -- -- --  PLT -- -- --  APTT -- -- --  LABPROT 13.5 -- --  INR 1.04 -- --  HEPARINUNFRC -- -- --  CREATININE -- 1.42* 1.35  CKTOTAL -- -- --  CKMB -- -- --  TROPONINI -- -- --    Estimated Creatinine Clearance: 58.4 ml/min (by C-G formula based on Cr of 1.42).   Assessment: 78 year old male on Coumadin PTA for PAF, admitted for bradycardia, junctional rhythm.  Coumadin has been on hold for possible for pacemaker placement (which cardiology has now decided against), now to resume.  INR = 1.04 (after being on hold)  Dose PTA = 7.5 mg TTHSS, 10 mg MWF  Goal of Therapy:  INR 2-3 Monitor platelets by anticoagulation protocol: Yes   Plan:  1) Repeat Coumadin 10 mg po x 1 dose tonight 2) Daily INR  Thank you. Okey Regal, PharmD [phone]  06/04/2012,9:39 AM

## 2012-06-04 NOTE — Consult Note (Signed)
[facility] Consult: 12:05 PM 06/04/2012   Referring Provider:  Dr Lavera Guise Primary Care Physician:  [name], MD, [facility] on [address] Primary Gastroenterologist:  Dr Christella Hartigan   Reason for Consultation: Ileus.   HPI: Tyler [name] is a morbidly obese 78 y.o. male.  Chronic diastolic dysfunction, PAF, s/p DCCV , chronic Coumadin, hx sick sinus syndrome, hypertension, IDDM, OSA/OHS (noncompliant w/ CPAP),  globus sensation with negative work up for esophageal stricture or dysmotility/dysphagia 2012, constipation, gastroparesis confirmed on GES in 2006.  On multiple diuretics including Aldactone, Demedex  Admitted 11/6 from SNF with chest and abdominal pain and bloating.  Bradycardic in 30s-40s in transit by EMS.  Junctional rhythm on EKG, potassium level 5.8. Treated with Kayexelate 11/6. Cardiology and EP following but at present "likelihood of pacemaker implantation seems low"  per Dr [name].  Improving acute on chronic renal failure with diagnoses of Klebsiella UTI.  Serum glucoses running in 200s to 300s. At SNF they run from 89 - 400s.  Coverage with SSI on top of Lantus LFTs are normal with exception of Albumin of 2.7.  He is also anemic with Hgb 11.2 initially, 9.9 hospital day 2 but no subsequent CBCs  Has developed ileus as inpt.  At SNF has had increased problems with abdominal bloating, this causes GER sxs to worsen and he has increased heartburn and nausea but does not vomit.  Has QOD loose to formed, non-bloody BMs with QOD use of Miralax.   Restarted on 5 mg Reglan at [facility] and referred to ENT by Dr Christella Hartigan on 08/1011.  Pt has chronic sinusitis and runny nose and tearing eyes.  ENT exam revealed no source for Globus but maxilofacial CT scan confirmed extensive paranasal sinus disease and air-fluid level the left maxillary sinus, compatible with acute disease.  At SNF takes daily Protonix, HS Reglan, HS Pepcid, Miralax 4  x weekly, prn Senekot-S, prn Simethicone.  Iron was discontinued yesterday.  Miralax dose increased to BID this AM.  In addition he is receiving BID Pepcid, 2 Senekot-S per day, Simethicone QID. Given Golytely on 8/9, drank all but about 2 inches of the 4 liter jug.  This resulted in one large liquid, 2 smaller liquid stools.   ENDOSCOPIC/GI STUDIES: EGD  04/2011   Dr Christella Hartigan for dysphagia with normal Barium esophagram: Moderate gastritis, otherwise normal study.  Gastric BX pathology: - CHRONIC INACTIVE GASTRITIS. - NO EVIDENCE OF HELICOBACTER PYLORI, INTESTINAL METAPLASIA, DYSPLASIA OR MALIGNANCY.  07/2003  Colonoscopy  At [facility] Normal study  03/2005 Gastric Emptying study: 43% of trace remaining at 120 minutes, normal is less than 30%  04/2011 MBS : "functional orpharyngreal swallow without aspiration of any consistency...recommended he continute regular diet with thin liquids".   02/2011 Barium Esophagram: Normal.  REVIEW OF SYSTEMS: Constitutional:  No new fatigue ENT:  Chronic sinus and ocular drainage Pulm:  can only ambulate around 10-15 feet before he gets short of breath CV:  As above, cp in non-exertional and more related to reflux related chest burning GU:  Having increased urination and incontinence.  Condom cath placed this AM. GI:  diffuse abdominal pain that has been intermittent over the last year. No melena/hematochezia.  Heme:  No hx of anemia to his knowledge, not sure who started him on po Iron.    Transfusions:  None ever Neuro:  In days preceding admit was getting lightheaded when he walking.  Still c/o dizziness.  No hx CVA Derm:  Hands itch a lot,  but there is no rash.  No sores on feet.  MS:  No joint pain.  Endocrine:  No sweats.  Polyuria.  ID:  No fever, chills Immunization:  Flu shot up to date Travel:  None    Past Medical History  Diagnosis Date  . Diabetes mellitus 1980  . Hyperlipidemia 1980  . Hypertension 1980  . Asthma   . Allergic  rhinitis   . GERD (gastroesophageal reflux disease)   . BPH (benign prostatic hypertrophy)     s/p thermo therapy, has bladdero utlet obst. and instablitiy, incontient  . Anemia   . Chronic constipation   . Sick sinus syndrome   . Paroxysmal atrial fibrillation     status post direct cardioversion- normal cardiac cath 2003, with normal left ventricular function (done after an abnormal cardiolite)  . Bradycardia     severe, on high doese AV-nodal blockers  . Bifascicular block     chronic  . Obesity   . Sleep apnea     noncomplicance w/ CPAP  . Hypoxia     in the past, likely multifactorial  . Gastroparesis     dx in 2006 through gastric scan  . Gynecomastia     nomral mammograms 11/2008  . Shortness of breath   . Chronic kidney disease   . Globus sensation     2012 EGD, Ba swallow, both negative.  MBSS 2012 negative for dysphagia.     Past Surgical History  Procedure Date  . Cataract extraction     bilateral  . Rotator cuff repair     left  . Cardiac catheterization 2003    normal, left ventricular function (done after an abnormal cardiolite)  . Tumor removal     off arms    Prior to Admission medications   Medication Sig Start Date End Date Taking? Authorizing Provider  acetaminophen (TYLENOL) 500 MG tablet Take 1,000 mg by mouth every 6 (six) hours as needed. For severe headache   Yes Historical Provider, MD  albuterol (VENTOLIN HFA) 108 (90 BASE) MCG/ACT inhaler Inhale 2 puffs into the lungs every 4 (four) hours as needed. For shortness of breath   Yes Historical Provider, MD  Bepotastine Besilate (BEPREVE) 1.5 % SOLN Place 1 drop into both eyes 2 (two) times daily.   Yes Historical Provider, MD  carvedilol (COREG) 6.25 MG tablet Take 6.25 mg by mouth 2 (two) times daily with a meal.     Yes Historical Provider, MD  cycloSPORINE (RESTASIS) 0.05 % ophthalmic emulsion Place 1 drop into both eyes 2 (two) times daily.    Yes Historical Provider, MD  dextrose (GLUTOSE) 40  % GEL Take 1 Tube by mouth once as needed. If cbg <60   Yes Historical Provider, MD  donepezil (ARICEPT) 10 MG tablet Take 10 mg by mouth at bedtime.     Yes Historical Provider, MD  doxazosin (CARDURA) 4 MG tablet Take 4 mg by mouth at bedtime.     Yes Historical Provider, MD  famotidine (PEPCID) 40 MG tablet Take 40 mg by mouth at bedtime.   Yes Historical Provider, MD  ferrous sulfate 325 (65 FE) MG tablet Take 325 mg by mouth daily with breakfast.     Yes Historical Provider, MD  fexofenadine (ALLEGRA) 180 MG tablet Take 180 mg by mouth daily as needed. For seasonal allergies   Yes Historical Provider, MD  Fluticasone-Salmeterol (ADVAIR) 100-50 MCG/DOSE AEPB Inhale 1 puff into the lungs 2 (two) times daily. Rinse mouth and spit  after each use   Yes Historical Provider, MD  hydroxypropyl methylcellulose (ISOPTO TEARS) 2.5 % ophthalmic solution Place 1 drop into both eyes every 2 (two) hours as needed. For dry eyes   Yes Historical Provider, MD  insulin aspart (NOVOLOG) 100 UNIT/ML injection Inject 5-24 Units into the skin 3 (three) times daily before meals. 110-150= 5 units; 151-200= 8 units; 201-250= 12 units; 251-300= 16 units; 301-350= 18 units; 351-400= 22 units; if >567 administer 24 units   Yes Historical Provider, MD  insulin glargine (LANTUS) 100 UNIT/ML injection Inject 39-44 Units into the skin 2 (two) times daily. 44 units twice daily for 7 days with last day 11/6 then 39 units twice daily for 7 days with last day 11/13   Yes Historical Provider, MD  INSULIN SYRINGE 1CC/29G (SAFETY-LOK INSULIN SYR 1CC/29G) 29G X 1/2" 1 ML MISC USE AS DIRECTED. 03/30/11  Yes Wanda Plump, MD  ipratropium (ATROVENT) 0.06 % nasal spray Place 2 sprays into the nose 3 (three) times daily.   Yes Historical Provider, MD  metFORMIN (GLUCOPHAGE-XR) 750 MG 24 hr tablet Take 750 mg by mouth daily with breakfast.     Yes Historical Provider, MD  metoCLOPramide (REGLAN) 10 MG tablet Take 0.5 tablets (5 mg total) by mouth  at bedtime. 09/18/11 09/17/12 Yes Rachael Fee, MD  Multiple Vitamin (MULTIVITAMIN WITH MINERALS) TABS Take 1 tablet by mouth every morning.   Yes Historical Provider, MD  neomycin-bacitracin-polymyxin (NEOSPORIN) OINT Apply 1 application topically daily. To left big toe   Yes Historical Provider, MD  pantoprazole (PROTONIX) 40 MG tablet Take 40 mg by mouth 2 (two) times daily.    Yes Historical Provider, MD  polyethylene glycol powder (GLYCOLAX/MIRALAX) powder Take 17 g by mouth 4 (four) times a week. Mon, Wed, Fri, Sun   Yes Historical Provider, MD  sennosides-docusate sodium (SENOKOT-S) 8.6-50 MG tablet Take 2 tablets by mouth at bedtime as needed. For constipation   Yes Historical Provider, MD  sertraline (ZOLOFT) 25 MG tablet Take 25 mg by mouth daily.     Yes Historical Provider, MD  simethicone (MYLICON) 80 MG chewable tablet Chew 80 mg by mouth every 6 (six) hours as needed. For gas   Yes Historical Provider, MD  simvastatin (ZOCOR) 20 MG tablet Take 20 mg by mouth at bedtime.     Yes Historical Provider, MD  sodium chloride (OCEAN) 0.65 % SOLN nasal spray Place 1 spray into the nose 4 (four) times daily.   Yes Historical Provider, MD  spironolactone (ALDACTONE) 25 MG tablet Take 25 mg by mouth 2 (two) times daily.     Yes Historical Provider, MD  torsemide (DEMADEX) 100 MG tablet Take 50 mg by mouth daily.     Yes Historical Provider, MD  trolamine salicylate (ASPERCREME) 10 % cream Apply 1 application topically 3 (three) times daily. To left shoulder   Yes Historical Provider, MD  Vitamin D, Ergocalciferol, (DRISDOL) 50000 UNITS CAPS Take 50,000 Units by mouth every 7 (seven) days. Fridays   Yes Historical Provider, MD  warfarin (COUMADIN) 5 MG tablet Take 7.5-10 mg by mouth daily. Take 2 tablets (=10mg ) on Monday, Wednesday, and Friday.  Take 1.5 tablet (=7.5mg ) on Tuesday, Thursday, Saturday, and sunday   Yes Historical Provider, MD    Scheduled Meds:    . aspirin EC  81 mg Oral  Daily  . cefTRIAXone (ROCEPHIN)  IV  1 g Intravenous Q24H  . cromolyn  1 drop Both Eyes BID  . doxazosin  4 mg Oral QHS  . famotidine  40 mg Oral BID  . heparin subcutaneous  5,000 Units Subcutaneous Q8H  . insulin aspart  0-20 Units Subcutaneous TID WC  . insulin aspart  0-5 Units Subcutaneous QHS  . insulin glargine  44 Units Subcutaneous BID  . ipratropium  2 spray Nasal TID  . mometasone-formoterol  2 puff Inhalation BID  . multivitamin with minerals  1 tablet Oral Daily  . neomycin-bacitracin-polymyxin  1 application Topical Daily  . polyethylene glycol  17 g Oral BID  . senna-docusate  2 tablet Oral Daily  . sertraline  25 mg Oral Daily  . simethicone  80 mg Oral QID  . simvastatin  20 mg Oral QHS  . sodium chloride  1 spray Nasal QID  . sodium chloride  3 mL Intravenous Q12H  . trolamine salicylate  1 application Topical TID  . Vitamin D (Ergocalciferol)  50,000 Units Oral Q7 days  . [COMPLETED] warfarin  10 mg Oral ONCE-1800  . warfarin  10 mg Oral ONCE-1800  . Warfarin - Pharmacist Dosing Inpatient   Does not apply q1800  . [DISCONTINUED] ferrous sulfate  325 mg Oral Q breakfast  . [DISCONTINUED] hydrALAZINE  10 mg Intravenous Q6H  . [DISCONTINUED] hydrALAZINE  10 mg Oral Q6H  . [DISCONTINUED] insulin glargine  30 Units Subcutaneous BID  . [DISCONTINUED] mometasone-formoterol  2 puff Inhalation BID  . [DISCONTINUED] multivitamin with minerals  1 tablet Oral Daily  . [DISCONTINUED] polyethylene glycol  17 g Oral Daily  . [DISCONTINUED] sennosides-docusate sodium  2 tablet Oral Daily   Infusions:   PRN Meds: acetaminophen, albuterol, alum & mag hydroxide-simeth, atropine, magnesium hydroxide, ondansetron (ZOFRAN) IV, ondansetron, polyvinyl alcohol, [DISCONTINUED] hydroxypropyl methylcellulose, [DISCONTINUED] polyethylene glycol powder, [DISCONTINUED] sennosides-docusate sodium, [DISCONTINUED] simethicone   Allergies as of 05/29/2012  . (No Known Allergies)    Family  History  Problem Relation Age of Onset  . Colon cancer Neg Hx   . Prostate cancer Maternal Uncle   . Heart disease Paternal Uncle     x 3, paternal aunt    History   Social History  . Marital Status: Widowed    Spouse Name: N/A    Number of Children: 2  . Years of Education: N/A   Occupational History  . retired    Social History Main Topics  . Smoking status: Former Smoker    Types: Cigarettes    Quit date: 07/25/1971  . Smokeless tobacco: Former Neurosurgeon    Types: Chew  . Alcohol Use: No  . Drug Use: No  . Sexually Active: Not on file   Other Topics Concern  . Not on file   Social History Narrative   Has lived in facility x aprox 4 years[place] 2[place]2010Does not have a POADoes not have a living will      PHYSICAL EXAM: Vital signs in last 24 hours: Temp:  [97.1 F (36.2 C)-97.9 F (36.6 C)] 97.9 F (36.6 C) (11/12 0427) Pulse Rate:  [75-83] 83  (11/12 0427) Resp:  [18-20] 20  (11/12 0427) BP: (133-171)/(63-70) 152/68 mmHg (11/12 0635) SpO2:  [96 %-100 %] 96 % (11/12 0940)  General: obese, does not look acutely ill, in distress or uncomfortable Head:  No facial edema, trauma or asymmetry  Eyes:  No icterus or conj pallor.  EOMI. Ears:  Not HOH  Nose:  No sneezing or discharge Mouth:  Clear, pink but slightly dry MM. Neck:  No mass, no JVD. Lungs:  Clear  B.  No cough or labored respirations Heart: RRR.  No MRG Abdomen:  Obese, tense, distended/protuberant.  Not tender.  Tinkling/tympanitic BS.   Rectal: deferred   Musc/Skeltl: no erythematous joints Extremities:  NO CCE.  Feet warm.   Neurologic:  Not confused, no tremor.  Moves all 4 limbs. Skin:  No rash, itching, no acanthosis nigrans Tattoos:  none Nodes:  No cervical adenopathy   Psych:  Pleasant, engaged, cooperative.  Good recall of events and meds.  Intake/Output from previous day: 11/11 0701 - 11/12 0700 In: -  Out: 1 [Stool:1] Intake/Output this shift: Total I/O In: 240  [P.O.:240] Out: -   LAB RESULTS:  CBC  Ref. Range 10/07/2010 10:36 10/07/2010 19:48 05/29/2012 12:40 05/29/2012 12:54 05/30/2012 01:25  Hemoglobin Latest Range: 13.0-17.0 g/dL 16.1 (L) 09.6 (L) 04.5 (L) 11.6 (L) 9.9 (L)  HCT Latest Range: 39.0-52.0 % 35.6 (L) 37.0 (L) 34.0 (L) 34.0 (L) 29.4 (L)  MCV Latest Range: 78.0-100.0 fL 89.8  88.5  88.6  MCH Latest Range: 26.0-34.0 pg   29.2  29.8  MCHC Latest Range: 30.0-36.0 g/dL 40.9  81.1  91.4  RDW Latest Range: 11.5-15.5 % 12.6  12.7  12.9  Platelets Latest Range: 150-400 K/uL 179.0  170  154     BMET Lab Results  Component Value Date   NA 136 06/04/2012   NA 136 06/03/2012   NA 140 06/02/2012   K 4.3 06/04/2012   K 4.5 06/03/2012   K 4.6 06/02/2012   CL 101 06/04/2012   CL 101 06/03/2012   CL 105 06/02/2012   CO2 28 06/04/2012   CO2 25 06/03/2012   CO2 27 06/02/2012   GLUCOSE 183* 06/04/2012   GLUCOSE 327* 06/03/2012   GLUCOSE 273* 06/02/2012   BUN 16 06/04/2012   BUN 17 06/03/2012   BUN 15 06/02/2012   CREATININE 1.46* 06/04/2012   CREATININE 1.42* 06/03/2012   CREATININE 1.35 06/02/2012   CALCIUM 8.8 06/04/2012   CALCIUM 8.6 06/03/2012   CALCIUM 8.7 06/02/2012       Mag                        2.2                                                                   05/29/2012                      LFT  Ref. Range 05/30/2012 01:25  Albumin Latest Range: 3.5-5.2 g/dL 2.7 (L)  AST Latest Range: 0-37 U/L 22  ALT Latest Range: 0-53 U/L 34  Total Protein Latest Range: 6.0-8.3 g/dL 5.8 (L)  Total Bilirubin Latest Range: 0.3-1.2 mg/dL 0.2 (L)   TSH         7.829     06/01/12 Free T4    1.18       06/01/12  PT/INR Lab Results  Component Value Date   INR 1.04 06/04/2012   INR 1.47 05/31/2012   INR 2.28* 05/30/2012    RADIOLOGY STUDIES: Dg Abd Portable 1v 06/03/2012    Findings: Gaseous colonic distention persists without substantial interval change.  There is some mild gaseous small bowel distention associated as well.  Rectal  tube overlies the lower midline pelvis. Visualized bony structures are unremarkable.  IMPRESSION: No substantial change in gaseous distention of the colon.   Original Report Authenticated By: Kennith Center, M.D.    Dg Abd Portable 1v 06/02/2012  Comparison: 05/30/2012  Findings: There are multiple air-filled loops of large and small bowel with continued prominence of a colonic loop in the left abdomen.  No definite free air or air-fluid levels.  Remainder of the exam is unchanged.  IMPRESSION: Continued air filled loops of large and small bowel with mild prominence of a colonic loop in the left abdomen.  Findings are likely due to ileus.  Recommend follow up as clinically indicated.   Original Report Authenticated By: Elberta Fortis, M.D.     IMPRESSION: *  Ileus in pt with chronic and recently progressive bloating constipation, klebsiella UTI, recent bradycardia/hyperkalemia.  This is multifactorial.  I suspect that at baseline he has a degree of ileus.  *  Gastroparesis.  GES 03/2005 c/w gastroparesis. *  Hx dysphagia/globus sensation with gastritis EGD, Ba swallow and MBS 2012.  *  GERD, sxs worse due to progression of ileus.  *  Junctional rhythm, bradycardia.  Resolved off Carvedilol.  PAF, chronic Coumadin resumed.  *  Anemia, acute on chronic.  MCV currently normal.  Takes po Iron [facility] daily at [facility]. Normal colonoscopy in 07/2003 *  IDDM, type 2 DM.  Poor control with sugars to 400s in SNF, to 300s here. However Hgb A1c is 6.7 on 05/31/12. *  Chronic diastolic heart failure.  Non-compliant with CPAP for OSA *  Klebsiella UTI.  Rocephin started on 11/11.  *  Acute on chronic renal failure, resolving.   PLAN: * Add back Protonix, IV for now.  Add low dose Reglan. Continue to hold Iron. I am going to allow full liquids. He was placed on clears this AM due to imaging findings but he was having no worsening of his chronic GI sxs with the Carb mod diet he had been receiving.       LOS: 6 days    Jennye Moccasin  06/04/2012, 12:05 PM Pager: [phone]  GI ATTENDING  History, laboratory, x-rays, prior GI assessments all personally reviewed. Patient seen and examined. Agree with H&P as outlined above. Patient has multiple medical problems and limited activity level. Comes into the hospital with bradycardia, hyperkalemia, acute UTI, and worsening renal insufficiency. As well, decreased activity. Patient is known to have diffuse GI motility disorders manifested by gastroparesis and colonic inertia. Now with ileus most certainly secondary to superimposed acute medical illness and decreased mobility. His abdominal exam is not acute. He is tolerating limited diet. At this point, the primary treatment is correction of his underlying medical problems, maintain normal electrolytes, good glucose control, and increased activity as much as feasible. Short trial of prokinetic will be tried, though efficacy questionable. I would allow his diet as tolerated. Thank you.  Wilhemina Bonito. Eda Keys., M.D. [facility] Division of Gastroenterology

## 2012-06-04 NOTE — Progress Notes (Signed)
Patient: Tyler [name] Date of Encounter: 06/04/2012, 7:55 AM Admit date: 05/29/2012     Subjective  Tyler [name] has no complaints this AM. He is resting comfortably in bed.   Objective  Physical Exam: Vitals: BP 152/68  Pulse 83  Temp 97.9 F (36.6 C) (Oral)  Resp 20  Ht 5' 10.5" (1.791 m)  Wt 268 lb 15.4 oz (122 kg)  BMI 38.05 kg/m2  SpO2 98% General: Well developed, well appearing 78 year old male in no acute distress. Neck: Supple. JVD not elevated. Lungs: Clear bilaterally to auscultation without wheezes, rales, or rhonchi. Breathing is unlabored. Heart: RRR S1 S2 without murmurs, rubs, or gallops.  Abdomen: Soft, ++distended. Extremities: No clubbing or cyanosis. No edema.  Neuro: Alert and oriented X 3. Moves all extremities spontaneously. No focal deficits.  Intake/Output:  Intake/Output Summary (Last 24 hours) at 06/04/12 0755 Last data filed at 06/03/12 1400  Gross per 24 hour  Intake      0 ml  Output      1 ml  Net     -1 ml    Inpatient Medications:     . aspirin EC  81 mg Oral Daily  . cefTRIAXone (ROCEPHIN)  IV  1 g Intravenous Q24H  . cromolyn  1 drop Both Eyes BID  . famotidine  40 mg Oral BID  . heparin subcutaneous  5,000 Units Subcutaneous Q8H  . hydrALAZINE  10 mg Oral Q6H  . insulin aspart  0-20 Units Subcutaneous TID WC  . insulin aspart  0-5 Units Subcutaneous QHS  . insulin glargine  44 Units Subcutaneous BID  . ipratropium  2 spray Nasal TID  . mometasone-formoterol  2 puff Inhalation BID  . multivitamin with minerals  1 tablet Oral Daily  . neomycin-bacitracin-polymyxin  1 application Topical Daily  . polyethylene glycol  17 g Oral BID  . sertraline  25 mg Oral Daily  . simvastatin  20 mg Oral QHS  . sodium chloride  1 spray Nasal QID  . sodium chloride  3 mL Intravenous Q12H  . trolamine salicylate  1 application Topical TID  . [COMPLETED] warfarin  10 mg Oral ONCE-1800  . Warfarin - Pharmacist Dosing Inpatient   Does  not apply q1800  . [DISCONTINUED] ferrous sulfate  325 mg Oral Q breakfast  . [DISCONTINUED] hydrALAZINE  10 mg Intravenous Q6H  . [DISCONTINUED] insulin glargine  30 Units Subcutaneous BID  . [DISCONTINUED] polyethylene glycol  17 g Oral Daily    Labs:  Basename 06/03/12 0500 06/02/12 0500  NA 136 140  K 4.5 4.6  CL 101 105  CO2 25 27  GLUCOSE 327* 273*  BUN 17 15  CREATININE 1.42* 1.35  CALCIUM 8.6 8.7  MG -- --  PHOS -- --    Radiology/Studies: Dg Chest Port 1 View  05/29/2012  *RADIOLOGY REPORT*  Clinical Data: Bradycardia.  Diabetes.  Nausea.  PORTABLE CHEST - 1 VIEW  Comparison: 10/07/2010.  Findings: Low volume chest.  Apical lordotic projection. Defibrillator pads project over the chest.  Monitoring leads are also present on the chest.  Allowing for the low volumes of inspiration, the heart lungs appear within normal limits.  IMPRESSION: Low volume chest.   Original Report Authenticated By: Andreas Newport, M.D.    Dg Abd Portable 1v  06/03/2012  *RADIOLOGY REPORT*  Clinical Data: Colonic distention  PORTABLE ABDOMEN - 1 VIEW  Comparison: 06/02/2012  Findings: Gaseous colonic distention persists without substantial interval change.  There is some mild gaseous small bowel distention associated as well.  Rectal tube overlies the lower midline pelvis. Visualized bony structures are unremarkable.  IMPRESSION: No substantial change in gaseous distention of the colon.   Original Report Authenticated By: Kennith Center, M.D.     Telemetry: sinus rhythm with PACs; no significant daytime bradycardia, AV block or pause   Assessment and Plan  1. Bradycardia - improved off carvedilol; at this time, there is no indication for PPM implantation 2. PAFib - stable off AV nodal agents; warfarin was restarted yesterday 3. Chronic diastolic HF - euvolemic at this time; continue medical therapy  4. CKD  5. UTI - per primary team 6. OSA - noncompliant with CPAP 7. Colonic  dilatation/dysmotility - per primary team At this time, there is no indication for PPM implantation. We have no further EP inpatient recommendations and will schedule outpatient follow-up in 4-6 weeks. Please call with any further questions.   Signed, EDMISTEN, BROOKE PA-C  I have seen, examined the patient, and reviewed the above assessment and plan.  Changes to above are made where necessary.  Pt appears to be doing well at this time.  At this point, I think that we should avoid PPM.  I will see as needed while here.  Will arrange follow-up with me in the office in 4 weeks.  Co Sign: [name], MD 06/04/2012 11:17 AM

## 2012-06-04 NOTE — Evaluation (Signed)
Occupational Therapy Evaluation Patient Details Name: Tyler [name] MRN: [id number] DOB: February 11, 1934 Today's Date: 06/04/2012 Time: 4098-1191 OT Time Calculation (min): 30 min  OT Assessment / Plan / Recommendation Clinical Impression  Pleasant cooperative 78 yr old male admitted with Chronic diastolic dysfunction.  Now presents functionally at an overall min guard assist level for simulated ADLS and basic selfcare tasks.  Feel he will benefit from short term acute OT to help increase independence back to a modified indepedent/supervision level.  Will benefit from [facility] at ALF once discharged.    OT Assessment  Patient needs continued OT Services    Follow Up Recommendations  Home health OT;Supervision - Intermittent    Barriers to Discharge None    Equipment Recommendations  None recommended by OT       Frequency  Min 2X/week    Precautions / Restrictions Precautions Precautions: Fall Restrictions Weight Bearing Restrictions: No   Pertinent Vitals/Pain No report of pain, HR 75 and O2 sats remained greater than 94 %    ADL  Eating/Feeding: Simulated;Independent Where Assessed - Eating/Feeding: Chair Grooming: Simulated;Min guard Where Assessed - Grooming: Supported standing Upper Body Bathing: Simulated;Set up Where Assessed - Upper Body Bathing: Unsupported sitting Lower Body Bathing: Simulated;Moderate assistance Where Assessed - Lower Body Bathing: Supported sit to stand Upper Body Dressing: Simulated;Set up Where Assessed - Upper Body Dressing: Unsupported sitting Lower Body Dressing: Simulated;Moderate assistance Where Assessed - Lower Body Dressing: Supported sit to stand Toilet Transfer: Counsellor Method: Surveyor, minerals: Regular height toilet;Grab bars Toileting - Architect and Hygiene: Simulated;Min guard Where Assessed - Engineer, mining and Hygiene: Sit to stand from 3-in-1 or  toilet Tub/Shower Transfer Method: Not assessed Equipment Used: Rolling walker Transfers/Ambulation Related to ADLs: Pt overall min guard assist with use of the RW for mobility ADL Comments: Pt reports difficulty with reaching his feet for LB selfcare.  Already has a reacher, sock aide, and LH sponge.  Will benefit from elastic shoe laces and a long handle shoe horn.      OT Diagnosis: Generalized weakness  OT Problem List: Decreased strength;Decreased activity tolerance;Impaired balance (sitting and/or standing);Decreased knowledge of use of DME or AE;Cardiopulmonary status limiting activity OT Treatment Interventions: Self-care/ADL training;Therapeutic activities;Therapeutic exercise;DME and/or AE instruction;Balance training;Patient/family education   OT Goals Acute Rehab OT Goals OT Goal Formulation: With patient Time For Goal Achievement: 06/18/12 Potential to Achieve Goals: Good ADL Goals Pt Will Perform Grooming: with modified independence;Supported;Standing at sink ADL Goal: Grooming - Progress: Goal set today Pt Will Perform Lower Body Bathing: with modified independence;with adaptive equipment;Sit to stand from chair ADL Goal: Lower Body Bathing - Progress: Goal set today Pt Will Perform Lower Body Dressing: with modified independence;Sit to stand from bed;with adaptive equipment ADL Goal: Lower Body Dressing - Progress: Goal set today Pt Will Transfer to Toilet: with modified independence;with DME ADL Goal: Toilet Transfer - Progress: Goal set today Miscellaneous OT Goals Miscellaneous OT Goal #1: Pt will perform LUE shoulder strengthening exercises with independence following handout. OT Goal: Miscellaneous Goal #1 - Progress: Goal set today  Visit Information  Last OT Received On: 06/04/12 Assistance Needed: +1 PT/OT Co-Evaluation/Treatment: Yes    Subjective Data  Subjective: "I was ready to get out of here yesterday." Patient Stated Goal: To get back to the assisted  living and get back to normal activities.   Prior Functioning     Home Living Lives With: Other (Comment) (Assisted living--[facility]) Available Help at  Discharge: Family (2 daughters) Type of Home: Assisted living Home Access: Level entry Home Layout: One level Bathroom Shower/Tub: Tub/shower unit;Curtain Firefighter: Standard Bathroom Accessibility: Yes How Accessible: Accessible via wheelchair;Accessible via walker Home Adaptive Equipment: Grab bars around toilet;Grab bars in shower;Built-in shower seat;Walker - rolling;Wheelchair - manual Prior Function Level of Independence: Independent with assistive device(s) Able to Take Stairs?: No Communication Communication: No difficulties Dominant Hand: Left         Vision/Perception Vision - Assessment Vision Assessment: Vision not tested Perception Perception: Within Functional Limits Praxis Praxis: Intact   Cognition  Overall Cognitive Status: Appears within functional limits for tasks assessed/performed Arousal/Alertness: Awake/alert Orientation Level: Appears intact for tasks assessed Behavior During Session: [facility] for tasks performed    Extremity/Trunk Assessment Right Upper Extremity Assessment RUE ROM/Strength/Tone: Deficits RUE ROM/Strength/Tone Deficits: Shoulder flexion 0-130 degrees AROM strength 3+/5, all other joints 4/5 RUE Sensation: WFL - Light Touch RUE Coordination: Deficits RUE Coordination Deficits: Pt with decreased ability to tie his gown. Left Upper Extremity Assessment LUE ROM/Strength/Tone: Deficits LUE ROM/Strength/Tone Deficits: AROM shoulder flexion 0- 90 degrees,  demonstrrates limited internal rotation when attempting to reach behind his back.  AAROM shoulder flexion 0-130 degrees.  All other joints AROM WFLS and strength 4/5. LUE Sensation: WFL - Light Touch LUE Coordination: Deficits LUE Coordination Deficits: Pt with decreased ability to tie his gown. Trunk Assessment Trunk  Assessment: Kyphotic     Mobility Transfers Transfers: Sit to Stand Sit to Stand: 4: Min guard;With upper extremity assist;With armrests;From chair/3-in-1           Balance Balance Balance Assessed: Yes Dynamic Standing Balance Dynamic Standing - Balance Support: Right upper extremity supported;Left upper extremity supported Dynamic Standing - Level of Assistance: 5: Stand by assistance   End of Session OT - End of Session Activity Tolerance: Patient tolerated treatment well Patient left: in chair;with call bell/phone within reach;with family/visitor present Nurse Communication: Mobility status    Linn Clavin OTR/L Pager number [phone] 06/04/2012, 1:06 PM

## 2012-06-04 NOTE — Progress Notes (Signed)
[facility] Progress Note    Tyler [name] ZOX:[id number] DOB: [birth year] DOA: 05/29/2012 PCP: [name], MD  Brief narrative: Patient is a 78 year old man resident of an assisted living facility, history of chronic diastolic dysfunction history of paroxysmal atrial fibrillation on chronic Coumadin therapy, history of sick sinus syndrome, hypertension, diabetes who was transferred from the skilled nursing facility for evaluation of the above-noted complaints. Upon evaluation by EMS in the field, he was found to be bradycardic. ECG revealed a junctional rhythm. Patient denied any ongoing dizziness, but he claimed that he has been having intermittent dizziness, exertional dyspnea, headache, abdominal bloating intermittently for the past few months. On the morning of admission he had some left sided chest pain, that he cannot describe very well. There was no radiation of the pain. It was not clear whether this chest pain was associated with shortness of breath or nausea. He claimed that he could only ambulate around 10-15 feet before he gets short of breath, over the past few days he did claim that he gets lightheaded when he walks. He denied any fever, he denies any cough. He denies any vomiting. Denied any diarrhea. In the ED he was found to have a junctional rhythm. He also was found to have hyperkalemia. He was admitted to the step down unit for further monitoring and treatment.  Assessment/Plan:  Symptomatic Bradycardia: Junctional rhythm-known  ATRIAL FIBRILLATION, PAROXYSMAL *Management per cardiology/EP *Continue to hold carvedilol -unsure which is any rate controlling agents patient will be able to tolerate in the future *Currently EP does not plan to pursue pacemaker placement since heart rate has improved off medications *Daughter's report patient had similar problem back in 2010 and was evaluated by cardiologist in [place] who recommended a pacemaker- pt unfortunately was not  able to follow up with him *History of cardiac catheterization in 2003 without any evidence of coronary disease   Ogilvie's syndrome *Multiple influencing factors: Diuretic use, beta blocker use, diabetes with likely gastroparesis, and decreased mobility *KUB reveals dilated bowel and retained stool- - no clear precipitating factor that we can reverse - asked GI for input 06/04/12    HYPERTENSION On Coreg, Doxazosin, Demadex and Aldactone PTA Patient was treated with iv Hydralazine from 05/31/12 until 06/04/12  Off Coreg due to bradycardia    CKD (chronic kidney disease) stage 3, GFR 30-59 ml/min *Likely has a degree of mild acute renal insufficiency due to dehydration and diminished perfusion related to ongoing symptomatic bradycardia prior to admission *Creatinine has remained stable at baseline ranges  Klebsiella UTI (lower urinary tract infection)/Leukocytosis *Urine culture demonstrates ampicillin resistant Klebsiella *pateint was started on Rocephin on 05/29/12 -06/04/12. Changed to po keflex 06/04/12 for 2 more days *blood cultures ordered on 11/9 - negative  Dehydration *Patient was on 2 diuretics prior to admission- which we held from admission  *has been hydrated via IVF- now discontinued *Echocardiogram this admission reveals normal systolic function and parameters consistent with grade 2 diastolic dysfunction.  *Discussed with patient's family and they are unaware of any previous heart failure diagnosis or issues with chronic edema  Diabetes mellitus, type 2 *CBGs still greater than 250 - a1c 6.7 *Continue Lantus- was increased to 30 units twice a day on 06/02/2012 unfortunately CBG have remained high. Back  to home dose of 44 units twice a day from 06/03/12 *Continue resistant sliding scale insulin  HYPERLIPIDEMIA *Continue statin  GERD *Will discontinue Protonix in favor of H2 blocker since on antibiotics and wish to decrease risk of C.  difficile colitis *GERD  appears to be secondary to bowel dysmotility so patient has been instructed on eating 5-6 small meals daily  Hyperkalemia *Likely related to dehydration and the Aldactone the patient was taking prior to admission   Warfarin anticoagulation *For paroxysmal atrial fibrillation *Coumadin was placed on hold for possible pacer placement - will resume  Obesity/OSA (obstructive sleep apnea)-noncompliant with CPAP *Bradycardia and sinus pauses occurred while patient was awake so doubtful sleep apnea contributing to the symptoms  DVT prophylaxis: heparin Code Status: Full Family Communication: patient at bedside  Disposition Plan: back to alf  Consultants: Cardiology Electrophysiology GI  Procedures: None  Antibiotics: Rocephin 11/6 >>>  HPI/Subjective: C/o distended abdomen    Objective: Blood pressure 152/68, pulse 83, temperature 97.9 F (36.6 C), temperature source Oral, resp. rate 20, height 5' 10.5" (1.791 m), weight 122 kg (268 lb 15.4 oz), SpO2 98.00%.  Intake/Output Summary (Last 24 hours) at 06/04/12 0802 Last data filed at 06/03/12 1400  Gross per 24 hour  Intake      0 ml  Output      1 ml  Net     -1 ml     Exam: General: No acute respiratory distress Lungs: Clear to auscultation bilaterally without wheezes or crackles, nasal cannula oxygen with saturations 100% Cardiovascular: Irregular rate and rhythm without murmur gallop or rub normal S1 and S2-NSR Abdomen: Nontender, very distended and tympanic, firm, bowel sounds positive, no rebound, no ascites, no appreciable mass Musculoskeletal: No significant cyanosis, clubbing of bilateral lower extremities Neurological: Patient is awake and alert, oriented times name place and time of day but not to year. Moves all extremities x4 weekly, exam non-focal  Data Reviewed: Basic Metabolic Panel:  Lab 06/03/12 3086 06/02/12 0500 06/01/12 0505 05/30/12 1428 05/30/12 0125 05/29/12 1240  NA 136 140 138 136 135 --  K  4.5 4.6 4.7 4.5 5.2* --  CL 101 105 103 102 104 --  CO2 25 27 27 24 23  --  GLUCOSE 327* 273* 255* 194* 232* --  BUN 17 15 16 18 23  --  CREATININE 1.42* 1.35 1.33 1.53* 1.94* --  CALCIUM 8.6 8.7 8.7 8.1* 7.8* --  MG -- -- -- -- -- 2.2  PHOS -- -- -- -- -- --   Liver Function Tests:  Lab 05/30/12 0125 05/29/12 1240  AST 22 45*  ALT 34 44  ALKPHOS 76 90  BILITOT 0.2* 0.2*  PROT 5.8* 6.7  ALBUMIN 2.7* 3.1*   CBC:  Lab 05/30/12 0125 05/29/12 1254 05/29/12 1240  WBC 7.9 -- 8.0  NEUTROABS -- -- 6.2  HGB 9.9* 11.6* 11.2*  HCT 29.4* 34.0* 34.0*  MCV 88.6 -- 88.5  PLT 154 -- 170   Cardiac Enzymes:  Lab 05/30/12 0008 05/29/12 1818 05/29/12 1240  CKTOTAL 57 65 --  CKMB 1.8 2.0 --  CKMBINDEX -- -- --  TROPONINI <0.30 <0.30 <0.30   BNP (last 3 results) No results found for this basename: PROBNP:3 in the last 8760 hours CBG:  Lab 06/04/12 0620 06/03/12 2135 06/03/12 1623 06/03/12 1114 06/03/12 0830  GLUCAP 201* 172* 368* 291* 300*    Recent Results (from the past 240 hour(s))  URINE CULTURE     Status: Normal   Collection Time   05/29/12  2:38 PM      Component Value Range Status Comment   Specimen Description URINE, CLEAN CATCH   Final    Special Requests NONE   Final    Culture  Setup Time  05/29/2012 15:43   Final    Colony Count >=100,000 COLONIES/ML   Final    Culture KLEBSIELLA PNEUMONIAE   Final    Report Status 05/31/2012 FINAL   Final    Organism ID, Bacteria KLEBSIELLA PNEUMONIAE   Final   MRSA PCR SCREENING     Status: Normal   Collection Time   05/29/12  5:38 PM      Component Value Range Status Comment   MRSA by PCR NEGATIVE  NEGATIVE Final   CULTURE, BLOOD (ROUTINE X 2)     Status: Normal (Preliminary result)   Collection Time   05/31/12 12:16 PM      Component Value Range Status Comment   Specimen Description BLOOD RIGHT THUMB   Final    Special Requests BOTTLES DRAWN AEROBIC ONLY 5.5CC   Final    Culture  Setup Time 05/31/2012 18:57   Final     Culture     Final    Value:        BLOOD CULTURE RECEIVED NO GROWTH TO DATE CULTURE WILL BE HELD FOR 5 DAYS BEFORE ISSUING A FINAL NEGATIVE REPORT   Report Status PENDING   Incomplete   CULTURE, BLOOD (ROUTINE X 2)     Status: Normal (Preliminary result)   Collection Time   05/31/12 12:27 PM      Component Value Range Status Comment   Specimen Description BLOOD LEFT HAND   Final    Special Requests BOTTLES DRAWN AEROBIC ONLY 6CC   Final    Culture  Setup Time 05/31/2012 18:57   Final    Culture     Final    Value:        BLOOD CULTURE RECEIVED NO GROWTH TO DATE CULTURE WILL BE HELD FOR 5 DAYS BEFORE ISSUING A FINAL NEGATIVE REPORT   Report Status PENDING   Incomplete      Studies:  Recent x-ray studies have been reviewed in detail by the Attending Physician  Scheduled Meds:     . aspirin EC  81 mg Oral Daily  . cefTRIAXone (ROCEPHIN)  IV  1 g Intravenous Q24H  . cromolyn  1 drop Both Eyes BID  . doxazosin  4 mg Oral QHS  . famotidine  40 mg Oral BID  . heparin subcutaneous  5,000 Units Subcutaneous Q8H  . insulin aspart  0-20 Units Subcutaneous TID WC  . insulin aspart  0-5 Units Subcutaneous QHS  . insulin glargine  44 Units Subcutaneous BID  . ipratropium  2 spray Nasal TID  . mometasone-formoterol  2 puff Inhalation BID  . multivitamin with minerals  1 tablet Oral q morning - 10a  . neomycin-bacitracin-polymyxin  1 application Topical Daily  . polyethylene glycol  17 g Oral BID  . sertraline  25 mg Oral Daily  . simvastatin  20 mg Oral QHS  . sodium chloride  1 spray Nasal QID  . sodium chloride  3 mL Intravenous Q12H  . trolamine salicylate  1 application Topical TID  . Vitamin D (Ergocalciferol)  50,000 Units Oral Q7 days  . [COMPLETED] warfarin  10 mg Oral ONCE-1800  . Warfarin - Pharmacist Dosing Inpatient   Does not apply q1800  . [DISCONTINUED] ferrous sulfate  325 mg Oral Q breakfast  . [DISCONTINUED] hydrALAZINE  10 mg Intravenous Q6H  . [DISCONTINUED]  hydrALAZINE  10 mg Oral Q6H  . [DISCONTINUED] insulin glargine  30 Units Subcutaneous BID  . [DISCONTINUED] mometasone-formoterol  2 puff Inhalation BID  . [  DISCONTINUED] multivitamin with minerals  1 tablet Oral Daily  . [DISCONTINUED] polyethylene glycol  17 g Oral Daily      Tim Corriher [phone]  If 7PM-7AM, please contact night-coverage [email or web address] Password [email or web address] 06/04/2012, 8:02 AM   LOS: 6 days

## 2012-06-04 NOTE — Evaluation (Signed)
Physical Therapy Evaluation Patient Details Name: Tyler [name] MRN: [id number] DOB: 22-Feb-1934 Today's Date: 06/04/2012 Time: 6295-2841 PT Time Calculation (min): 35 min  PT Assessment / Plan / Recommendation Clinical Impression  pt adm with chest and abd.discomfort and diasolic dysfunction.  Mobility hindered by general weakness and decr activity tolerance.  Pt could benefit from PT to maximize I with functional mob.    PT Assessment  Patient needs continued PT services    Follow Up Recommendations  Home health PT    Does the patient have the potential to tolerate intense rehabilitation      Barriers to Discharge None      Equipment Recommendations  None recommended by OT    Recommendations for Other Services     Frequency Min 3X/week    Precautions / Restrictions Precautions Precautions: Fall Restrictions Weight Bearing Restrictions: No   Pertinent Vitals/Pain       Mobility  Bed Mobility Bed Mobility: Not assessed Transfers Transfers: Sit to Stand;Stand to Sit Sit to Stand: 4: Min guard;With upper extremity assist;With armrests;From chair/3-in-1 Stand to Sit: 4: Min guard;With upper extremity assist;To chair/3-in-1 Details for Transfer Assistance: vc's for hand placement Ambulation/Gait Ambulation/Gait Assistance: 4: Min guard Ambulation Distance (Feet): 100 Feet Assistive device: Rolling walker Ambulation/Gait Assistance Details: flexed posture, too far away from rolling walker for max safety, shuffles steps Gait Pattern: Step-through pattern;Decreased step length - right;Decreased step length - left;Decreased stride length;Shuffle;Trunk flexed Stairs: No Wheelchair Mobility Wheelchair Mobility: No    Shoulder Instructions     Exercises     PT Diagnosis: Generalized weakness  PT Problem List: Decreased strength;Decreased activity tolerance;Decreased balance;Decreased mobility;Decreased knowledge of use of DME PT Treatment Interventions: DME  instruction;Gait training;Functional mobility training;Therapeutic activities;Balance training;Patient/family education   PT Goals Acute Rehab PT Goals PT Goal Formulation: With patient Time For Goal Achievement: 06/18/12 Potential to Achieve Goals: Good Pt will go Supine/Side to Sit: with modified independence PT Goal: Supine/Side to Sit - Progress: Goal set today Pt will go Sit to Stand: with modified independence PT Goal: Sit to Stand - Progress: Goal set today Pt will Transfer Bed to Chair/Chair to Bed: with modified independence PT Transfer Goal: Bed to Chair/Chair to Bed - Progress: Goal set today Pt will Ambulate: 51 - 150 feet;with modified independence;with least restrictive assistive device PT Goal: Ambulate - Progress: Goal set today  Visit Information  Last PT Received On: 06/04/12 Assistance Needed: +1    Subjective Data  Subjective: Not too far off from normal Patient Stated Goal: Home I   Prior Functioning  Home Living Lives With: Other (Comment) (Assisted living--[facility]) Available Help at Discharge: Family (2 daughters) Type of Home: Assisted living Home Access: Level entry Home Layout: One level Bathroom Shower/Tub: Tub/shower unit;Curtain Firefighter: Standard Bathroom Accessibility: Yes How Accessible: Accessible via wheelchair;Accessible via walker Home Adaptive Equipment: Grab bars around toilet;Grab bars in shower;Built-in shower seat;Walker - rolling;Wheelchair - manual Prior Function Level of Independence: Independent with assistive device(s) Able to Take Stairs?: No Driving: No Communication Communication: No difficulties Dominant Hand: Left    Cognition  Overall Cognitive Status: Appears within functional limits for tasks assessed/performed Arousal/Alertness: Awake/alert Orientation Level: Appears intact for tasks assessed Behavior During Session: [facility] for tasks performed    Extremity/Trunk Assessment Right Upper Extremity  Assessment RUE ROM/Strength/Tone: Deficits RUE ROM/Strength/Tone Deficits: Shoulder flexion 0-130 degrees AROM strength 3+/5, all other joints 4/5 RUE Sensation: WFL - Light Touch RUE Coordination: Deficits RUE Coordination Deficits: Pt with decreased ability  to tie his gown. Left Upper Extremity Assessment LUE ROM/Strength/Tone: Deficits LUE ROM/Strength/Tone Deficits: AROM shoulder flexion 0- 90 degrees,  demonstrrates limited internal rotation when attempting to reach behind his back.  AAROM shoulder flexion 0-130 degrees.  All other joints AROM WFLS and strength 4/5. LUE Sensation: WFL - Light Touch LUE Coordination: Deficits LUE Coordination Deficits: Pt with decreased ability to tie his gown. Right Lower Extremity Assessment RLE ROM/Strength/Tone: Within functional levels RLE Sensation: WFL - Light Touch RLE Coordination: WFL - gross/fine motor Left Lower Extremity Assessment LLE ROM/Strength/Tone: WFL for tasks assessed;Deficits LLE ROM/Strength/Tone Deficits: quads and hams ~4/5 LLE Sensation: WFL - Light Touch LLE Coordination: WFL - gross/fine motor Trunk Assessment Trunk Assessment: Kyphotic   Balance Balance Balance Assessed: Yes Dynamic Standing Balance Dynamic Standing - Balance Support: Right upper extremity supported;Left upper extremity supported Dynamic Standing - Level of Assistance: 5: Stand by assistance  End of Session PT - End of Session Activity Tolerance: Patient tolerated treatment well;Patient limited by fatigue Patient left: in chair;with call bell/phone within reach Nurse Communication: Mobility status  GP     Sritha Chauncey, Eliseo Gum 06/04/2012, 2:26 PM  06/04/2012  Warren Bing, PT [phone] (pager)

## 2012-06-05 ENCOUNTER — Inpatient Hospital Stay (HOSPITAL_COMMUNITY): Payer: Medicare Other

## 2012-06-05 LAB — GLUCOSE, CAPILLARY
Glucose-Capillary: 132 mg/dL — ABNORMAL HIGH (ref 70–99)
Glucose-Capillary: 107 mg/dL — ABNORMAL HIGH (ref 70–99)
Glucose-Capillary: 165 mg/dL — ABNORMAL HIGH (ref 70–99)

## 2012-06-05 LAB — BASIC METABOLIC PANEL
BUN: 15 mg/dL (ref 6–23)
CO2: 26 mEq/L (ref 19–32)
Chloride: 102 mEq/L (ref 96–112)
Creatinine, Ser: 1.4 mg/dL — ABNORMAL HIGH (ref 0.50–1.35)

## 2012-06-05 LAB — CBC
HCT: 30.5 % — ABNORMAL LOW (ref 39.0–52.0)
MCHC: 33.4 g/dL (ref 30.0–36.0)
MCV: 87.1 fL (ref 78.0–100.0)
RDW: 12.5 % (ref 11.5–15.5)

## 2012-06-05 MED ORDER — ACETAMINOPHEN 325 MG PO TABS
650.0000 mg | ORAL_TABLET | Freq: Four times a day (QID) | ORAL | Status: DC | PRN
  Administered 2012-06-05: 650 mg via ORAL
  Filled 2012-06-05: qty 2

## 2012-06-05 MED ORDER — IOHEXOL 300 MG/ML  SOLN
900.0000 mL | Freq: Once | INTRAMUSCULAR | Status: AC | PRN
  Administered 2012-06-05: 900 mL

## 2012-06-05 MED ORDER — WARFARIN SODIUM 2.5 MG PO TABS
12.5000 mg | ORAL_TABLET | Freq: Once | ORAL | Status: AC
  Administered 2012-06-05: 12.5 mg via ORAL
  Filled 2012-06-05: qty 1

## 2012-06-05 MED ORDER — LACTATED RINGERS IV SOLN
INTRAVENOUS | Status: DC
  Administered 2012-06-05 – 2012-06-06 (×2): via INTRAVENOUS

## 2012-06-05 NOTE — Progress Notes (Signed)
06/05/2012 9:11 AM Nursing note Received call from [name]. Verbal orders received to order Contrast Enema Study. Confirmed correct placement of order in Epic with Radiology.  Tyler [name], Blanchard Kelch

## 2012-06-05 NOTE — Progress Notes (Signed)
ANTICOAGULATION CONSULT NOTE - Follow-up Consult  Pharmacy Consult for Coumadin Indication: PAF  No Known Allergies  Labs:  Basename 06/05/12 0530 06/04/12 0842 06/04/12 0615 06/03/12 0500  HGB 10.2* -- -- --  HCT 30.5* -- -- --  PLT 184 -- -- --  APTT -- -- -- --  LABPROT 13.6 -- 13.5 --  INR 1.05 -- 1.04 --  HEPARINUNFRC -- -- -- --  CREATININE 1.40* 1.46* -- 1.42*  CKTOTAL -- -- -- --  CKMB -- -- -- --  TROPONINI -- -- -- --    Estimated Creatinine Clearance: 59.2 ml/min (by C-G formula based on Cr of 1.4).   Assessment: 78 year old male on Coumadin PTA for PAF, admitted for bradycardia, junctional rhythm.  Coumadin has been on hold for possible for pacemaker placement (which cardiology has now decided against), now to resume.  INR = 1.05 (after being on hold)  Dose PTA = 7.5 mg TTHSS, 10 mg MWF  Goal of Therapy:  INR 2-3 Monitor platelets by anticoagulation protocol: Yes   Plan:  1) Coumadin 12.5 mg po x 1 dose tonight 2) Daily INR  Thank you. Okey Regal, PharmD [phone]  06/05/2012,9:46 AM

## 2012-06-05 NOTE — Progress Notes (Signed)
06/05/2012 8:06 AM Nursing note AM ABD Xray results called to Dr. Mahala Menghini as well as pt. Complaints of increased nausea. Orders received and enacted. PRN Zofran given per orders and GI MD contacted and updated as well. Will continue to closely monitor patient.  Zeki Bedrosian, Blanchard Kelch

## 2012-06-05 NOTE — Progress Notes (Signed)
Tyler [name] Daily Rounding Note 06/05/2012, 9:40 AM  SUBJECTIVE:       Nausea this AM. He says it was mild.  No flatus, no stools, no belly pain just feeling bloated and having heartburn  OBJECTIVE:         Vital signs in last 24 hours:    Temp:  [97.7 F (36.5 C)-98.4 F (36.9 C)] 98.4 F (36.9 C) (11/13 0525) Pulse Rate:  [73-86] 86  (11/13 0525) Resp:  [19-20] 20  (11/13 0525) BP: (142-155)/(58-64) 155/64 mmHg (11/13 0525) SpO2:  [94 %-99 %] 98 % (11/13 0525) Last BM Date: 06/03/12 General: obese, chron ill looking but NAD   Heart: RRR Chest:  Fine crackles at bases Abdomen: more tense, not tender, tinkling BS.  Extremities: no edema Neuro/Psych:  Pleasant, not disoriented, no tremor.  Cooperative.   Intake/Output from previous day: 11/12 0701 - 11/13 0700 In: 720 [P.O.:720] Out: 675 [Urine:675]  Intake/Output this shift:    Lab Results:  Basename 06/05/12 0530  WBC 5.3  HGB 10.2*  HCT 30.5*  PLT 184   BMET  Basename 06/05/12 0530 06/04/12 0842 06/03/12 0500  NA 137 136 136  K 4.1 4.3 4.5  CL 102 101 101  CO2 26 28 25   GLUCOSE 124* 183* 327*  BUN 15 16 17   CREATININE 1.40* 1.46* 1.42*  CALCIUM 8.5 8.8 8.6   LFT No results found for this basename: PROT:3,ALBUMIN:3,AST:3,ALT:3,ALKPHOS:3,BILITOT:3,BILIDIR:3,IBILI:3 in the last 72 hours PT/INR  Basename 06/05/12 0530 06/04/12 0615  LABPROT 13.6 13.5  INR 1.05 1.04   Studies/Results: Dg Abd Portable 1v 06/05/2012  *RADIOLOGY REPORT*  Clinical Data: Follow up ileus.  PORTABLE ABDOMEN - 1 VIEW  Comparison: 06/03/2012  Findings: Portable supine view of the abdomen again demonstrates marked gaseous distention of the colon.  There appears to be a large distended bowel loop in the left upper abdomen, measuring up to 12 cm.  This could be associated with transverse colon or sigmoid colon.  The pelvic region was not imaged.  IMPRESSION: Concern for increased gaseous distention of the colon.  Findings may  represent worsening ileus but a distal colonic obstruction cannot be excluded, particularly since the pelvic region is not imaged on this examination.  If there is clinical concern for colonic obstruction, suggest a contrast enema for further evaluation.   Original Report Authenticated By: Richarda Overlie, M.D.     ASSESMENT: * Ileus in pt with chronic and recently progressive bloating constipation, klebsiella UTI, recent bradycardia/hyperkalemia. This is multifactorial. I suspect that at baseline he has a degree of ileus. X ray today raising question of distal colonic obstruction.  * Gastroparesis. GES 03/2005 c/w gastroparesis.  * Hx dysphagia/globus sensation with gastritis EGD, Ba swallow and MBS 2012.  * GERD, sxs worse due to progression of ileus.  * Junctional rhythm, bradycardia. Resolved off Carvedilol. PAF, chronic Coumadin resumed.  * Anemia, acute on chronic. MCV currently normal. Takes po Iron once daily at [facility]. Normal colonoscopy in 07/2003  * IDDM, type 2 DM. Poor control with sugars to 400s in SNF, to 300s here. However Hgb A1c is 6.7 on 05/31/12.  * Chronic diastolic heart failure. Non-compliant with CPAP for OSA  * Klebsiella UTI. Rocephin started on 11/11.  * Acute on chronic renal failure, continues to improve.  Note low urine output, is this correct?, does the 675 cc reflect full 24 hours or just amount since condom cath placed.     PLAN: *  Gastrograffin contrast  study of colon to rule out obstruction.  *  Put diet back to ice chips, sips.  May need NGT placed however not vomiting or significantly nauseated at present *  IVF need to be restarted, Dr Mahala Menghini will coordinate this   LOS: 7 days   Jennye Moccasin  06/05/2012, 9:40 AM Pager: [phone]   [name] ATTENDING  Patient seen and examined. Laboratories reviewed. Today's KUB reviewed personally. Agree with assessment as outlined above. 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 However, not unreasonable rule out  distal obstruction on x-ray, given limitations. Agree with contrast enema study. We'll back off on diet some. Continue all other measures as outlined yesterday. Will follow.  Wilhemina Bonito. Eda Keys., M.D. [facility] Division of Gastroenterology

## 2012-06-05 NOTE — Progress Notes (Signed)
06/05/2012 5:34 PM Nursing note Pt. Started having intermittent runs of Junctional rhythm Hr as low as 31, non-sustained each episode lasting less that 20 seconds. Pt. Rhythm immediately changing back to NSR in 70 to 90 bpm range.  Pt. Asymptomatic. VSS. Dr. Mahala Menghini paged and made aware. No order received at this time. Will continue to closely monitor patient.  Tyler [name], Blanchard Kelch

## 2012-06-05 NOTE — Progress Notes (Signed)
[facility] Progress Note    Tyler [name] ZOX:[id number] DOB: October 28, 1933 DOA: 05/29/2012 PCP: [name], MD  Brief narrative: Patient is a 78 year old man resident of an assisted living facility, history of chronic diastolic dysfunction history of paroxysmal atrial fibrillation on chronic Coumadin therapy, history of sick sinus syndrome, hypertension, diabetes who was transferred from the skilled nursing facility for evaluation of the above-noted complaints. Upon evaluation by EMS in the field, he was found to be bradycardic. ECG revealed a junctional rhythm. Patient denied any ongoing dizziness, but he claimed that he has been having intermittent dizziness, exertional dyspnea, headache, abdominal bloating intermittently for the past few months. On the morning of admission he had some left sided chest pain, that he cannot describe very well. There was no radiation of the pain. It was not clear whether this chest pain was associated with shortness of breath or nausea. He claimed that he could only ambulate around 10-15 feet before he gets short of breath, over the past few days he did claim that he gets lightheaded when he walks. He denied any fever, he denies any cough. He denies any vomiting. Denied any diarrhea. In the ED he was found to have a junctional rhythm. He also was found to have hyperkalemia. He was admitted to the step down unit for further monitoring and treatment.  Assessment/Plan:  Symptomatic Bradycardia: Junctional rhythm-known ATRIAL FIBRILLATION, PAROXYSMAL *Management per cardiology/EP-has a junctional escape rhythm on monitor-->Discussed this 11/13 with [name] who recommended just monitoring *Continue to hold carvedilol-unsure which is any rate controlling agents patient will be able to tolerate in the future *Currently EP does not plan to pursue pacemaker placement since heart rate has improved off medications *Daughter's report patient had similar problem  back in 2010 and was evaluated by cardiologist in [place] who recommended a pacemaker- pt unfortunately was not able to follow up with him *History of cardiac catheterization in 2003 without any evidence of coronary disease   Ogilvie's syndrome *Multiple influencing factors: Diuretic use, beta blocker use, diabetes with likely gastroparesis, and decreased mobility *KUB reveals dilated bowel and retained stool-Contrast enema was negative-this likely may need Colonic decompression?? -this might be chronic - no clear precipitating factor that we can reverse--encouraged to ambulate-rest per GI, appreciate input  HYPERTENSION On Coreg, Doxazosin, Demadex and Aldactone PTA Patient was treated with iv Hydralazine from 05/31/12 until 06/04/12  Off Coreg due to bradycardia    CKD (chronic kidney disease) stage 3, GFR 30-59 ml/min *Likely has a degree of mild acute renal insufficiency due to dehydration and diminished perfusion related to ongoing symptomatic bradycardia prior to admission *Creatinine has remained stable at baseline ranges-start Lactated ringer 5-0 cc /hr  Klebsiella UTI (lower urinary tract infection)/Leukocytosis *Urine culture demonstrates ampicillin resistant Klebsiella *pateint was started on Rocephin on 05/29/12 -06/04/12. Changed to po keflex to complete 06/06/12  *blood cultures ordered on 11/9 - negative  Dehydration *Patient was on 2 diuretics prior to admission- which we held from admission  *has been hydrated via IVF- now discontinued *Echocardiogram this admission reveals normal systolic function and parameters consistent with grade 2 diastolic dysfunction.  *Discussed with patient's family and they are unaware of any previous heart failure diagnosis or issues with chronic edema  Diabetes mellitus, type 2 *CBGs still greater than 250 - a1c 6.7 *Continue Lantus- was increased to 30 units twice a day on 06/02/2012 \ Back  to home dose of 44 units twice a day from  06/03/12 *Continue resistant sliding scale insulin-will  iuncrease lantus 11/14 if prn  HYPERLIPIDEMIA *Continue statin  GERD *Will discontinue Protonix in favor of H2 blocker since on antibiotics and wish to decrease risk of C. difficile colitis *GERD appears to be secondary to bowel dysmotility so patient has been instructed on eating 5-6 small meals daily  Hyperkalemia *Likely related to dehydration and the Aldactone the patient was taking prior to admission  Warfarin anticoagulation *For paroxysmal atrial fibrillation *Coumadin was placed on hold for possible pacer placement - will resume  Obesity/OSA (obstructive sleep apnea)-noncompliant with CPAP *Bradycardia and sinus pauses occurred while patient was awake so doubtful sleep apnea contributing to the symptoms  DVT prophylaxis: heparin Code Status: Full Family Communication: patient at bedside  Disposition Plan: back to alf  Consultants: Cardiology Electrophysiology GI  Procedures: None  Antibiotics: Rocephin 11/6 >>>  HPI/Subjective: C/o distended abdomen.  States he has no specific c/o at present like Cp/or sob.  DOes feel somewhat distended and occasional N.  NO vomiting however   Objective: Blood pressure 151/66, pulse 90, temperature 98.3 F (36.8 C), temperature source Oral, resp. rate 20, height 5' 10.5" (1.791 m), weight 122 kg (268 lb 15.4 oz), SpO2 100.00%.  Intake/Output Summary (Last 24 hours) at 06/05/12 1451 Last data filed at 06/05/12 0644  Gross per 24 hour  Intake    240 ml  Output    675 ml  Net   -435 ml     Exam: General: No acute respiratory distress Lungs: Clear to auscultation bilaterally without wheezes or crackles, nasal cannula oxygen with saturations 100% Cardiovascular: Irregular rate and rhythm without murmur gallop or rub normal S1 and S2-NSR Abdomen: Nontender, very distended and tympanic, firm, bowel sounds decreased, no rebound, no ascites, no appreciable  mass Musculoskeletal: No significant cyanosis, clubbing of bilateral lower extremities Neurological: Patient is awake and alert, oriented times name place and time of day but not to year. Moves all extremities x4 weekly, exam non-focal  Data Reviewed: Basic Metabolic Panel:  Lab 06/05/12 6213 06/04/12 0842 06/03/12 0500 06/02/12 0500 06/01/12 0505  NA 137 136 136 140 138  K 4.1 4.3 4.5 4.6 4.7  CL 102 101 101 105 103  CO2 26 28 25 27 27   GLUCOSE 124* 183* 327* 273* 255*  BUN 15 16 17 15 16   CREATININE 1.40* 1.46* 1.42* 1.35 1.33  CALCIUM 8.5 8.8 8.6 8.7 8.7  MG -- -- -- -- --  PHOS -- -- -- -- --   Liver Function Tests:  Lab 05/30/12 0125  AST 22  ALT 34  ALKPHOS 76  BILITOT 0.2*  PROT 5.8*  ALBUMIN 2.7*   CBC:  Lab 06/05/12 0530 05/30/12 0125  WBC 5.3 7.9  NEUTROABS -- --  HGB 10.2* 9.9*  HCT 30.5* 29.4*  MCV 87.1 88.6  PLT 184 154   Cardiac Enzymes:  Lab 05/30/12 0008 05/29/12 1818  CKTOTAL 57 65  CKMB 1.8 2.0  CKMBINDEX -- --  TROPONINI <0.30 <0.30   BNP (last 3 results) No results found for this basename: PROBNP:3 in the last 8760 hours CBG:  Lab 06/05/12 0605 06/04/12 2210 06/04/12 1639 06/04/12 1125 06/04/12 0620  GLUCAP 126* 182* 222* 245* 201*    Recent Results (from the past 240 hour(s))  URINE CULTURE     Status: Normal   Collection Time   05/29/12  2:38 PM      Component Value Range Status Comment   Specimen Description URINE, CLEAN CATCH   Final    Special Requests NONE  Final    Culture  Setup Time 05/29/2012 15:43   Final    Colony Count >=100,000 COLONIES/ML   Final    Culture KLEBSIELLA PNEUMONIAE   Final    Report Status 05/31/2012 FINAL   Final    Organism ID, Bacteria KLEBSIELLA PNEUMONIAE   Final   MRSA PCR SCREENING     Status: Normal   Collection Time   05/29/12  5:38 PM      Component Value Range Status Comment   MRSA by PCR NEGATIVE  NEGATIVE Final   CULTURE, BLOOD (ROUTINE X 2)     Status: Normal (Preliminary result)    Collection Time   05/31/12 12:16 PM      Component Value Range Status Comment   Specimen Description BLOOD RIGHT THUMB   Final    Special Requests BOTTLES DRAWN AEROBIC ONLY 5.5CC   Final    Culture  Setup Time 05/31/2012 18:57   Final    Culture     Final    Value:        BLOOD CULTURE RECEIVED NO GROWTH TO DATE CULTURE WILL BE HELD FOR 5 DAYS BEFORE ISSUING A FINAL NEGATIVE REPORT   Report Status PENDING   Incomplete   CULTURE, BLOOD (ROUTINE X 2)     Status: Normal (Preliminary result)   Collection Time   05/31/12 12:27 PM      Component Value Range Status Comment   Specimen Description BLOOD LEFT HAND   Final    Special Requests BOTTLES DRAWN AEROBIC ONLY 6CC   Final    Culture  Setup Time 05/31/2012 18:57   Final    Culture     Final    Value:        BLOOD CULTURE RECEIVED NO GROWTH TO DATE CULTURE WILL BE HELD FOR 5 DAYS BEFORE ISSUING A FINAL NEGATIVE REPORT   Report Status PENDING   Incomplete      Studies:  Recent x-ray studies have been reviewed in detail by the Attending Physician  Scheduled Meds:     . aspirin EC  81 mg Oral Daily  . cephALEXin  500 mg Oral Q8H  . cromolyn  1 drop Both Eyes BID  . doxazosin  4 mg Oral QHS  . famotidine  40 mg Oral BID  . heparin subcutaneous  5,000 Units Subcutaneous Q8H  . insulin aspart  0-20 Units Subcutaneous TID WC  . insulin aspart  0-5 Units Subcutaneous QHS  . insulin glargine  44 Units Subcutaneous BID  . ipratropium  2 spray Nasal TID  . mometasone-formoterol  2 puff Inhalation BID  . multivitamin with minerals  1 tablet Oral Daily  . neomycin-bacitracin-polymyxin  1 application Topical Daily  . pantoprazole (PROTONIX) IV  40 mg Intravenous Q24H  . polyethylene glycol  17 g Oral BID  . senna-docusate  2 tablet Oral Daily  . sertraline  25 mg Oral Daily  . sodium chloride  1 spray Nasal QID  . sodium chloride  3 mL Intravenous Q12H  . trolamine salicylate  1 application Topical TID  . Vitamin D (Ergocalciferol)   50,000 Units Oral Q7 days  . [COMPLETED] warfarin  10 mg Oral ONCE-1800  . warfarin  12.5 mg Oral ONCE-1800  . Warfarin - Pharmacist Dosing Inpatient   Does not apply q1800  . [DISCONTINUED] cefTRIAXone (ROCEPHIN)  IV  1 g Intravenous Q24H  . [DISCONTINUED] simethicone  80 mg Oral QID  . [DISCONTINUED] simvastatin  20 mg Oral QHS  Tyler [name] [phone]  If 7PM-7AM, please contact night-coverage [email or web address] Password [email or web address] 06/05/2012, 2:51 PM   LOS: 7 days

## 2012-06-05 NOTE — Progress Notes (Addendum)
06/05/2012 1400 Nursing note  Called and clarified with pharmacist the appropriateness of pt. On heparin sq and coumadin. Pharmacist states this is ok considering low INR. Will continue to monitor patient. Cable Fearn, Blanchard Kelch

## 2012-06-06 LAB — GLUCOSE, CAPILLARY
Glucose-Capillary: 115 mg/dL — ABNORMAL HIGH (ref 70–99)
Glucose-Capillary: 79 mg/dL (ref 70–99)
Glucose-Capillary: 131 mg/dL — ABNORMAL HIGH (ref 70–99)
Glucose-Capillary: 204 mg/dL — ABNORMAL HIGH (ref 70–99)

## 2012-06-06 LAB — CBC WITH DIFFERENTIAL/PLATELET
Basophils Absolute: 0 10*3/uL (ref 0.0–0.1)
Eosinophils Absolute: 0.4 10*3/uL (ref 0.0–0.7)
Eosinophils Relative: 8 % — ABNORMAL HIGH (ref 0–5)
Lymphs Abs: 1.1 10*3/uL (ref 0.7–4.0)
MCH: 29.2 pg (ref 26.0–34.0)
MCV: 87.7 fL (ref 78.0–100.0)
Neutrophils Relative %: 66 % (ref 43–77)
Platelets: 194 10*3/uL (ref 150–400)
RBC: 3.42 MIL/uL — ABNORMAL LOW (ref 4.22–5.81)
RDW: 12.5 % (ref 11.5–15.5)
WBC: 5.7 10*3/uL (ref 4.0–10.5)

## 2012-06-06 LAB — BASIC METABOLIC PANEL
BUN: 14 mg/dL (ref 6–23)
CO2: 27 mEq/L (ref 19–32)
Calcium: 8.7 mg/dL (ref 8.4–10.5)
Chloride: 100 mEq/L (ref 96–112)
Creatinine, Ser: 1.44 mg/dL — ABNORMAL HIGH (ref 0.50–1.35)
Glucose, Bld: 119 mg/dL — ABNORMAL HIGH (ref 70–99)

## 2012-06-06 LAB — CULTURE, BLOOD (ROUTINE X 2): Culture: NO GROWTH

## 2012-06-06 MED ORDER — DOXAZOSIN MESYLATE 2 MG PO TABS
2.0000 mg | ORAL_TABLET | Freq: Every day | ORAL | Status: DC
  Administered 2012-06-06 – 2012-06-09 (×4): 2 mg via ORAL
  Filled 2012-06-06 (×6): qty 1

## 2012-06-06 MED ORDER — HEPARIN (PORCINE) IN NACL 100-0.45 UNIT/ML-% IJ SOLN
1400.0000 [IU]/h | INTRAMUSCULAR | Status: DC
  Administered 2012-06-06: 1400 [IU]/h via INTRAVENOUS
  Filled 2012-06-06: qty 250

## 2012-06-06 MED ORDER — HEPARIN BOLUS VIA INFUSION
2000.0000 [IU] | Freq: Once | INTRAVENOUS | Status: AC
  Administered 2012-06-06: 2000 [IU] via INTRAVENOUS
  Filled 2012-06-06: qty 2000

## 2012-06-06 MED ORDER — SODIUM CHLORIDE 0.9 % IV SOLN
INTRAVENOUS | Status: DC
  Administered 2012-06-07: 07:00:00 via INTRAVENOUS

## 2012-06-06 MED ORDER — CHLORHEXIDINE GLUCONATE 4 % EX LIQD
60.0000 mL | Freq: Once | CUTANEOUS | Status: DC
  Filled 2012-06-06: qty 60

## 2012-06-06 MED ORDER — CHLORHEXIDINE GLUCONATE 4 % EX LIQD
60.0000 mL | Freq: Once | CUTANEOUS | Status: AC
  Administered 2012-06-06: 4 via TOPICAL
  Filled 2012-06-06: qty 60

## 2012-06-06 MED ORDER — DEXTROSE 5 % IV SOLN
3.0000 g | INTRAVENOUS | Status: DC
  Filled 2012-06-06: qty 3000

## 2012-06-06 MED ORDER — SODIUM CHLORIDE 0.9 % IR SOLN
80.0000 mg | Status: DC
  Filled 2012-06-06: qty 2

## 2012-06-06 MED ORDER — SODIUM CHLORIDE 0.45 % IV SOLN
INTRAVENOUS | Status: DC
  Administered 2012-06-07: 07:00:00 via INTRAVENOUS

## 2012-06-06 NOTE — Progress Notes (Signed)
ANTICOAGULATION CONSULT NOTE - Follow-up Consult  Pharmacy Consult for Coumadin Indication: PAF  No Known Allergies  Labs:  Basename 06/06/12 0440 06/05/12 0530 06/04/12 0842 06/04/12 0615  HGB -- 10.2* -- --  HCT -- 30.5* -- --  PLT -- 184 -- --  APTT -- -- -- --  LABPROT 14.1 13.6 -- 13.5  INR 1.10 1.05 -- 1.04  HEPARINUNFRC -- -- -- --  CREATININE 1.44* 1.40* 1.46* --  CKTOTAL -- -- -- --  CKMB -- -- -- --  TROPONINI -- -- -- --    Estimated Creatinine Clearance: 57.6 ml/min (by C-G formula based on Cr of 1.44).   Assessment: 78 year old male on Coumadin PTA for PAF, admitted for bradycardia, junctional rhythm.    Now planning pacemaker for AM  INR = 1.10  Dose PTA = 7.5 mg TTHSS, 10 mg MWF  Goal of Therapy:  INR 2-3 Monitor platelets by anticoagulation protocol: Yes   Plan:  1) Hold Coumadin for pacemaker 2) Daily INR  Thank you. Okey Regal, PharmD [phone]  06/06/2012,9:36 AM

## 2012-06-06 NOTE — Progress Notes (Addendum)
ANTICOAGULATION CONSULT NOTE - Initial Consult  Pharmacy Consult for Heparin Indication: PAF  No Known Allergies  Weight: 122 kg Height: 70.5 in IBW: 74 kg Heparin Dosing Weight: 100 kg Labs:  Basename 06/06/12 0440 06/05/12 0530 06/04/12 0842 06/04/12 0615  HGB -- 10.2* -- --  HCT -- 30.5* -- --  PLT -- 184 -- --  APTT -- -- -- --  LABPROT 14.1 13.6 -- 13.5  INR 1.10 1.05 -- 1.04  HEPARINUNFRC -- -- -- --  CREATININE 1.44* 1.40* 1.46* --  CKTOTAL -- -- -- --  CKMB -- -- -- --  TROPONINI -- -- -- --    Estimated Creatinine Clearance: 57.6 ml/min (by C-G formula based on Cr of 1.44).   Assessment: 78 year old male on Coumadin PTA for PAF, admitted for bradycardia, junctional rhythm.  Coumadin on hold for pacemaker 11/15. INR = 1.10 (subtherapeutic). To begin heparin bridge. Received 5000 units SQ heparin at 1456.  Dose PTA = 7.5 mg TTHSS, 10 mg MWF  Goal of Therapy:  INR 2-3 Monitor platelets by anticoagulation protocol: Yes   Plan:  1) Will give small heparin IV bolus 2000 units 2) Heparin gtt at 1400 units/hr 3) F/u 8 hour heparin level 4) F/u daily heparin level and CBC 5) D/C SQ heparin  Christoper Fabian, PharmD, BCPS Clinical pharmacist, pager [phone] 06/06/2012,5:39 PM  Addendum:  Noted this heparin gtt was d/c at 2120 by [name].  Christoper Fabian, PharmD, BCPS Clinical pharmacist, pager [phone] 06/06/2012   9:20 PM

## 2012-06-06 NOTE — Progress Notes (Signed)
   Patient: Tyler [name] Date of Encounter: 06/06/2012, 7:51 AM Admit date: 05/29/2012     Subjective  Mr. Roggenkamp reports intermittent "lightheadedness" overnight but feels it may be due to lack of sleep. He has been awake this AM since 4:00. He is not sure if he was lightheaded at ~0700 (at the time of his bradycardia). Nursing report states he was asymptomatic. He denies CP, SOB, palpitations.   Objective  Physical Exam: Vitals: BP 162/77  Pulse 63  Temp 98 F (36.7 C) (Oral)  Resp 18  Ht 5' 10.5" (1.791 m)  Wt 268 lb 15.4 oz (122 kg)  BMI 38.05 kg/m2  SpO2 100% General: Well developed, well appearing 78 year old male in no acute distress. Neck: Supple. JVD not elevated. Lungs: Clear bilaterally to auscultation without wheezes, rales, or rhonchi. Breathing is unlabored. Heart: RRR S1 S2 without murmurs, rubs, or gallops.  Abdomen: Soft, +distended. Extremities: No clubbing or cyanosis. No edema.  Distal pedal pulses are 2+ and equal bilaterally. Neuro: Alert and oriented X 3. Moves all extremities spontaneously. No focal deficits.  Intake/Output:  Intake/Output Summary (Last 24 hours) at 06/06/12 0751 Last data filed at 06/06/12 0600  Gross per 24 hour  Intake    840 ml  Output    451 ml  Net    389 ml    Inpatient Medications:     . aspirin EC  81 mg Oral Daily  . [COMPLETED] cephALEXin  500 mg Oral Q8H  . cromolyn  1 drop Both Eyes BID  . doxazosin  4 mg Oral QHS  . famotidine  40 mg Oral BID  . heparin subcutaneous  5,000 Units Subcutaneous Q8H  . insulin aspart  0-20 Units Subcutaneous TID WC  . insulin aspart  0-5 Units Subcutaneous QHS  . insulin glargine  44 Units Subcutaneous BID  . ipratropium  2 spray Nasal TID  . mometasone-formoterol  2 puff Inhalation BID  . multivitamin with minerals  1 tablet Oral Daily  . neomycin-bacitracin-polymyxin  1 application Topical Daily  . pantoprazole (PROTONIX) IV  40 mg Intravenous Q24H  . polyethylene  glycol  17 g Oral BID  . senna-docusate  2 tablet Oral Daily  . sertraline  25 mg Oral Daily  . sodium chloride  1 spray Nasal QID  . sodium chloride  3 mL Intravenous Q12H  . trolamine salicylate  1 application Topical TID  . Vitamin D (Ergocalciferol)  50,000 Units Oral Q7 days  . [COMPLETED] warfarin  12.5 mg Oral ONCE-1800  . Warfarin - Pharmacist Dosing Inpatient   Does not apply q1800  . [DISCONTINUED] simethicone  80 mg Oral QID  . [DISCONTINUED] simvastatin  20 mg Oral QHS      . lactated ringers 50 mL/hr at 06/05/12 1848    Labs:  Basename 06/06/12 0440 06/05/12 0530  NA 134* 137  K 4.4 4.1  CL 100 102  CO2 27 26  GLUCOSE 119* 124*  BUN 14 15  CREATININE 1.44* 1.40*  CALCIUM 8.7 8.5  MG -- --  PHOS -- --    Basename 06/05/12 0530  WBC 5.3  NEUTROABS --  HGB 10.2*  HCT 30.5*  MCV 87.1  PLT 184    INR 1.1   Radiology/Studies: Dg Chest Port 1 View  05/29/2012  *RADIOLOGY REPORT*  Clinical Data: Bradycardia.  Diabetes.  Nausea.  PORTABLE CHEST - 1 VIEW  Comparison: 10/07/2010.  Findings: Low volume chest.  Apical lordotic projection. Defibrillator   pads project over the chest.  Monitoring leads are also present on the chest.  Allowing for the low volumes of inspiration, the heart lungs appear within normal limits.  IMPRESSION: Low volume chest.   Original Report Authenticated By: Geoffrey Lamke, M.D.    Dg Abd Portable 1v  06/05/2012  *RADIOLOGY REPORT*  Clinical Data: Follow up ileus.  PORTABLE ABDOMEN - 1 VIEW  Comparison: 06/03/2012  Findings: Portable supine view of the abdomen again demonstrates marked gaseous distention of the colon.  There appears to be a large distended bowel loop in the left upper abdomen, measuring up to 12 cm.  This could be associated with transverse colon or sigmoid colon.  The pelvic region was not imaged.  IMPRESSION: Concern for increased gaseous distention of the colon.  Findings may represent worsening ileus but a distal colonic  obstruction cannot be excluded, particularly since the pelvic region is not imaged on this examination.  If there is clinical concern for colonic obstruction, suggest a contrast enema for further evaluation.   Original Report Authenticated By: Adam Henn, M.D.    Dg Colon W/water Sol Cm  06/05/2012  *RADIOLOGY REPORT*  Clinical Data: Distended abdomen with diffusely dilated colon. Question colonic obstruction.  WATER SOLUBLE CONTRAST ENEMA  Technique:  Initial scout AP supine abdominal image was not repeated, having been performed earlier today.  Water soluble contrast was introduced into the colon in a retrograde fashion and refluxed from the rectum to the cecum.  Spot images of the colon followed by overhead radiographs were obtained.  Fluoroscopy time: 1.37 minutes.  Comparison:  Abdominal radiographs 06/05/2012 and 06/03/2012. Abdominal pelvic CT 03/19/2008.  Findings:  The colon is diffusely distended, especially the sigmoid colon which is moderately distended and elongated.  Contrast refluxes freely through the sigmoid colon into the descending colon.  By turning the patient, I was able to maneuver the contrast into the transverse colon, and subsequently into the right colon. There is no evidence of colonic obstruction or perforation.  No focal mucosal lesions are identified.  Mucosal assessment is limited by the patient's size and water soluble technique.  A post drainage/post evacuation view shows mild improvement in the diffuse colonic distension.  IMPRESSION: No evidence of colonic obstruction, focal mucosal lesion or volvulus.  The colon is diffusely dilated and elongated.   Original Report Authenticated By: William Veazey, M.D.     Telemetry: normal sinus rhythm currently in the 70s; this AM has had intermittent junctional bradycardia with rates ~40; no significant pauses    Assessment and Plan  1. Bradycardia - junctional; recurrent despite discontinuation of his AV nodal agents; will keep NPO  until seen by Dr. Otto Felkins for possible PPM 2. PAFib - stable off AV nodal agents; warfarin was restarted yesterday; INR 1.1 this AM 3. Chronic diastolic HF - euvolemic at this time; continue medical therapy  4. CKD  5. UTI - per primary team  6. OSA - noncompliant with CPAP; Mr. Sailors's episodes this AM occurred while awake so bradycardia less likely due to untreated OSA 7. Colonic dilatation/dysmotility - per primary team  Signed, EDMISTEN, BROOKE PA-C  I have seen, examined the patient, and reviewed the above assessment and plan.  Changes to above are made where necessary.  Pt with recurrence of sinus pauses and junctional escape rhythm.  He reports having some dizziness yesterday with this.  He remains off of his AV nodal agents.  The patient has symptomatic bradycardia.  I would therefore recommend pacemaker implantation at   this point.  Risks, benefits, alternatives to pacemaker implantation were discussed in detail with the patient today. The patient understands that the risks include but are not limited to bleeding, infection, pneumothorax, perforation, tamponade, vascular damage, renal failure, MI, stroke, death,  and lead dislodgement and wishes to proceed.  I will plan to proceed with PPM tomorrow am.  He can resume diet as per primary team today and I will place orders for the procedure tomorrow am.   Co Sign: Mililani Murthy, MD 06/06/2012 8:14 AM   

## 2012-06-06 NOTE — Progress Notes (Signed)
Physical Therapy Treatment Patient Details Name: Tyler [name] MRN: [id number] DOB: 09-21-1933 Today's Date: 06/06/2012 Time: 4098-1191 PT Time Calculation (min): 23 min  PT Assessment / Plan / Recommendation Comments on Treatment Session  EHR in mid 70's to low 80's,  SaO2 at 97%  dyspnea 1-2/4.   pt noticeably more fatigued given the extra distance we went today,, but still recovered more quickly than expected..    Follow Up Recommendations  Home health PT     Does the patient have the potential to tolerate intense rehabilitation     Barriers to Discharge        Equipment Recommendations  None recommended by OT    Recommendations for Other Services    Frequency Min 3X/week   Plan Discharge plan remains appropriate    Precautions / Restrictions Precautions Precautions: Fall Restrictions Weight Bearing Restrictions: No   Pertinent Vitals/Pain EHR in the low 80's max, SaO2 97%    Mobility  Bed Mobility Bed Mobility: Not assessed Transfers Transfers: Sit to Stand;Stand to Sit Sit to Stand: 4: Min assist;With upper extremity assist;From chair/3-in-1;From toilet Details for Transfer Assistance: vc's for hand placement Ambulation/Gait Ambulation/Gait Assistance: 4: Min guard Ambulation Distance (Feet): 190 Feet Assistive device: Rolling walker Ambulation/Gait Assistance Details: generally steady gait, noticeably better than without a device or holding to rail Gait Pattern: Step-through pattern;Decreased step length - right;Decreased step length - left;Decreased stride length;Shuffle;Trunk flexed Stairs: No Wheelchair Mobility Wheelchair Mobility: No    Exercises     PT Diagnosis:    PT Problem List:   PT Treatment Interventions:     PT Goals Acute Rehab PT Goals Time For Goal Achievement: 06/18/12 Potential to Achieve Goals: Good PT Goal: Sit to Stand - Progress: Progressing toward goal PT Transfer Goal: Bed to Chair/Chair to Bed - Progress: Progressing  toward goal PT Goal: Ambulate - Progress: Progressing toward goal  Visit Information  Last PT Received On: 06/06/12 Assistance Needed: +1    Subjective Data  Subjective: Still feeling bloated and burping   Cognition  Overall Cognitive Status: Appears within functional limits for tasks assessed/performed Arousal/Alertness: Awake/alert Orientation Level: Appears intact for tasks assessed Behavior During Session: [facility] for tasks performed    Balance  Balance Balance Assessed: Yes  End of Session PT - End of Session Activity Tolerance: Patient tolerated treatment well;Patient limited by fatigue Patient left: in chair;with call bell/phone within reach Nurse Communication: Mobility status   GP     Aniqua Briere, Eliseo Gum 06/06/2012, 2:14 PM  06/06/2012  [place] Bing, PT [phone] (pager)

## 2012-06-06 NOTE — Progress Notes (Signed)
Clinical Social Work  Per MD, patient not ready to dc today. CSW will continue to follow and will assist as needed to coordinate dc.  [place], [place] [phone] (Coverage for Frederico Hamman)

## 2012-06-06 NOTE — Progress Notes (Signed)
[facility] Progress Note    FATE MCCRARY ZOX:[id number] DOB: 06-Dec-1933 DOA: 05/29/2012 PCP: [name], MD  Brief narrative: Patient is a 78 year old man resident of an assisted living facility, history of chronic diastolic dysfunction history of paroxysmal atrial fibrillation on chronic Coumadin therapy, history of sick sinus syndrome, hypertension, diabetes who was transferred from the skilled nursing facility for evaluation of the above-noted complaints. Upon evaluation by EMS in the field, he was found to be bradycardic. ECG revealed a junctional rhythm. Patient claimed that he has been having intermittent dizziness, exertional dyspnea, headache, abdominal bloating intermittently for the past few months.  In the ED he was found to have a junctional rhythm. He also was found to have hyperkalemia. He was admitted to the step down unit for further monitoring and treatment.  On the morning of admission he had some left sided chest pain, that he cannot describe very well. There was no radiation of the pain. It was not clear whether this chest pain was associated with shortness of breath or nausea. He claimed that he could only ambulate around 10-15 feet before he gets short of breath, over the past few days he did claim that he gets lightheaded when he walks. He denied any fever, he denies any cough. He denies any vomiting. Denied any diarrhea. Asian had a relatively uneventful hospital stay however then was noted to have abdominal distention and a GI consult was requested 06/04/2012 which was performed by [name]. He was restarted on Reglan in the hospital and kept on liquid diet and eventually pass stool 06/06/2012 which was diarrhea-like in nature with some formation but no blood in it  Assessment/Plan:  Symptomatic Bradycardia: Junctional rhythm-known ATRIAL FIBRILLATION, PAROXYSMAL *Management per cardiology/EP-has a junctional escape rhythm on monitor-->Discussed this 11/13  with [name] who reviewed the patient on the morning of 06/07/1999 39 and elected to place a pacemaker 06/07/2012. *Continue to hold carvedilol-unsure which is any rate controlling agents patient will be able to tolerate in the future *Daughter's report patient had similar problem back in 2010 and was evaluated by cardiologist in [place] who recommended a pacemaker- pt unfortunately was not able to follow up with him *History of cardiac catheterization in 2003 without any evidence of coronary disease  Ogilvie's syndrome *Multiple influencing factors: Diuretic use, beta blocker use, diabetes with likely gastroparesis, and decreased mobility *KUB reveals dilated bowel and retained stool-Contrast enema was negative-patient passed a stool 06/06/2012 however he still appears distended - no clear precipitating factor that we can reverse--encouraged to ambulate-rest per GI, appreciate input-note that GI has ordered a KUB in the morning along with  HYPERTENSION On g, Doxazosin was tapered to 2 mg from 4 mg given as 1.7-10% risk of hypotension with this Patient was treated with iv Hydralazine from 05/31/12 until 06/04/12   CKD (chronic kidney disease) stage 3, GFR 30-59 ml/min *Likely has a degree of mild acute renal insufficiency due to dehydration and diminished perfusion related to ongoing symptomatic bradycardia prior to admission *Creatinine has remained stable at baseline ranges-start Lactated ringer 50 cc/hr  Klebsiella UTI (lower urinary tract infection)/Leukocytosis *Urine culture demonstrates ampicillin resistant Klebsiella *pateint was started on Rocephin on 05/29/12 -06/04/12. Changed to po keflex to complete 06/06/12  *blood cultures ordered on 11/9--negative  Dehydration *Patient was on 2 diuretics prior to admission- which we held from admission  *has been hydrated via IVF- continue for now until taking full po *Echocardiogram this admission reveals normal systolic function and  parameters consistent  with grade 2 diastolic dysfunction.  *Discussed with patient's family and they are unaware of any previous heart failure diagnosis or issues with chronic edema  Diabetes mellitus, type 2 *CBGs still greater than 250 - a1c 6.7 *Continue Lantus- was increased to 30 units twice a day on 06/02/2012--Back  to home dose of 44 units twice a day from 06/03/12 *Continue resistant sliding scale insulin-will increase lantus 11/14 if prn  HYPERLIPIDEMIA *Continue statin  GERD *Will discontinue Protonix in favor of H2 blocker since on antibiotics and wish to decrease risk of C. difficile colitis *GERD appears to be secondary to bowel dysmotility so patient has been instructed on eating 5-6 small meals daily  Hyperkalemia *Likely related to dehydration and the Aldactone the patient was taking prior to admission  Warfarin anticoagulation *For paroxysmal atrial fibrillation *Coumadin was placed on hold for possible pacer placement. Transitioned to heparin Gtt for CVa protection 11/14  Obesity/OSA (obstructive sleep apnea)-noncompliant with CPAP *Bradycardia and sinus pauses occurred while patient was awake so doubtful sleep apnea contributing to the symptoms  DVT prophylaxis: heparin Code Status: Full Family Communication: patient at bedside  Disposition Plan: back to alf  Consultants: Cardiology Electrophysiology GI  Procedures: None  Antibiotics: Rocephin 11/6 >>>  HPI/Subjective: C/o distended abdomen.  States he has no specific c/o at present like Cp/or sob.  DOes feel somewhat distended and occasional N.  NO vomiting however   Objective: Blood pressure 143/54, pulse 67, temperature 97.5 F (36.4 C), temperature source Oral, resp. rate 18, height 5' 10.5" (1.791 m), weight 122 kg (268 lb 15.4 oz), SpO2 99.00%.  Intake/Output Summary (Last 24 hours) at 06/06/12 1710 Last data filed at 06/06/12 0600  Gross per 24 hour  Intake    840 ml  Output    451 ml    Net    389 ml     Exam: General: No acute respiratory distress Lungs: Clear to auscultation bilaterally without wheezes or crackles, nasal cannula oxygen with saturations 100% Cardiovascular: Irregular rate and rhythm without murmur gallop or rub normal S1 and S2-NSR Abdomen: Nontender, very distended and tympanic, firm, bowel sounds decreased, no rebound, no ascites, no appreciable mass Musculoskeletal: No significant cyanosis, clubbing of bilateral lower extremities Neurological: Patient is awake and alert, oriented times name place and time of day but not to year. Moves all extremities x4 weekly, exam non-focal  Data Reviewed: Basic Metabolic Panel:  Lab 06/06/12 9562 06/05/12 0530 06/04/12 0842 06/03/12 0500 06/02/12 0500  NA 134* 137 136 136 140  K 4.4 4.1 4.3 4.5 4.6  CL 100 102 101 101 105  CO2 27 26 28 25 27   GLUCOSE 119* 124* 183* 327* 273*  BUN 14 15 16 17 15   CREATININE 1.44* 1.40* 1.46* 1.42* 1.35  CALCIUM 8.7 8.5 8.8 8.6 8.7  MG -- -- -- -- --  PHOS -- -- -- -- --   Liver Function Tests: No results found for this basename: AST:5,ALT:5,ALKPHOS:5,BILITOT:5,PROT:5,ALBUMIN:5 in the last 168 hours CBC:  Lab 06/05/12 0530  WBC 5.3  NEUTROABS --  HGB 10.2*  HCT 30.5*  MCV 87.1  PLT 184   Cardiac Enzymes: No results found for this basename: CKTOTAL:5,CKMB:5,CKMBINDEX:5,TROPONINI:5 in the last 168 hours BNP (last 3 results) No results found for this basename: PROBNP:3 in the last 8760 hours CBG:  Lab 06/06/12 1604 06/06/12 1123 06/06/12 0616 06/05/12 2050 06/05/12 1609  GLUCAP 131* 79 115* 165* 107*    Recent Results (from the past 240 hour(s))  URINE CULTURE  Status: Normal   Collection Time   05/29/12  2:38 PM      Component Value Range Status Comment   Specimen Description URINE, CLEAN CATCH   Final    Special Requests NONE   Final    Culture  Setup Time 05/29/2012 15:43   Final    Colony Count >=100,000 COLONIES/ML   Final    Culture KLEBSIELLA  PNEUMONIAE   Final    Report Status 05/31/2012 FINAL   Final    Organism ID, Bacteria KLEBSIELLA PNEUMONIAE   Final   MRSA PCR SCREENING     Status: Normal   Collection Time   05/29/12  5:38 PM      Component Value Range Status Comment   MRSA by PCR NEGATIVE  NEGATIVE Final   CULTURE, BLOOD (ROUTINE X 2)     Status: Normal   Collection Time   05/31/12 12:16 PM      Component Value Range Status Comment   Specimen Description BLOOD RIGHT THUMB   Final    Special Requests BOTTLES DRAWN AEROBIC ONLY 5.5CC   Final    Culture  Setup Time 05/31/2012 18:57   Final    Culture NO GROWTH 5 DAYS   Final    Report Status 06/06/2012 FINAL   Final   CULTURE, BLOOD (ROUTINE X 2)     Status: Normal   Collection Time   05/31/12 12:27 PM      Component Value Range Status Comment   Specimen Description BLOOD LEFT HAND   Final    Special Requests BOTTLES DRAWN AEROBIC ONLY [facility]   Final    Culture  Setup Time 05/31/2012 18:57   Final    Culture NO GROWTH 5 DAYS   Final    Report Status 06/06/2012 FINAL   Final      Studies:  Recent x-ray studies have been reviewed in detail by the Attending Physician  Scheduled Meds:     . aspirin EC  81 mg Oral Daily  . [COMPLETED] cephALEXin  500 mg Oral Q8H  . cromolyn  1 drop Both Eyes BID  . doxazosin  4 mg Oral QHS  . famotidine  40 mg Oral BID  . heparin subcutaneous  5,000 Units Subcutaneous Q8H  . insulin aspart  0-20 Units Subcutaneous TID WC  . insulin aspart  0-5 Units Subcutaneous QHS  . insulin glargine  44 Units Subcutaneous BID  . ipratropium  2 spray Nasal TID  . mometasone-formoterol  2 puff Inhalation BID  . multivitamin with minerals  1 tablet Oral Daily  . neomycin-bacitracin-polymyxin  1 application Topical Daily  . pantoprazole (PROTONIX) IV  40 mg Intravenous Q24H  . polyethylene glycol  17 g Oral BID  . senna-docusate  2 tablet Oral Daily  . sertraline  25 mg Oral Daily  . sodium chloride  1 spray Nasal QID  . sodium chloride  3  mL Intravenous Q12H  . trolamine salicylate  1 application Topical TID  . Vitamin D (Ergocalciferol)  50,000 Units Oral Q7 days  . [COMPLETED] warfarin  12.5 mg Oral ONCE-1800  . Warfarin - Pharmacist Dosing Inpatient   Does not apply q1800      Rhetta Mura [phone]  If 7PM-7AM, please contact night-coverage [email or web address] Password [email or web address] 06/06/2012, 5:10 PM   LOS: 8 days

## 2012-06-06 NOTE — Progress Notes (Signed)
Occupational Therapy Treatment Patient Details Name: Tyler [name] MRN: [id number] DOB: Dec 20, 1933 Today's Date: 06/06/2012 Time: 4098-1191 OT Time Calculation (min): 17 min  OT Assessment / Plan / Recommendation Comments on Treatment Session Pt doing well overall - requires min A for most ADLs, but does fatigue and require rest breaks    Follow Up Recommendations  Home health OT;Supervision - Intermittent    Barriers to Discharge       Equipment Recommendations  None recommended by OT    Recommendations for Other Services    Frequency Min 2X/week   Plan Discharge plan remains appropriate    Precautions / Restrictions Precautions Precautions: Fall Restrictions Weight Bearing Restrictions: No   Pertinent Vitals/Pain     ADL  Grooming: Simulated;Min guard Where Assessed - Grooming: Supported standing Toilet Transfer: Minimal Dentist Method: Sit to Barista: Regular height toilet;Grab bars Toileting - Clothing Manipulation and Hygiene: Minimal assistance Where Assessed - Glass blower/designer Manipulation and Hygiene: Standing Transfers/Ambulation Related to ADLs: Pt ambulates with min guard assist.  Pt. fatigues  ADL Comments: Pt did not wish to perform grooming at sink.  Instructed pt to start ambulating to BR with nursing, rather than using BSC to increase activity/endurance.  Pt requires cues to perform at his maximal potential    OT Diagnosis:    OT Problem List:   OT Treatment Interventions:     OT Goals ADL Goals ADL Goal: Toilet Transfer - Progress: Progressing toward goals  Visit Information  Last OT Received On: 06/06/12 Assistance Needed: +1 PT/OT Co-Evaluation/Treatment: Yes    Subjective Data      Prior Functioning       Cognition  Overall Cognitive Status: Appears within functional limits for tasks assessed/performed Arousal/Alertness: Awake/alert Orientation Level: Appears intact for tasks  assessed Behavior During Session: [facility] for tasks performed    Mobility  Shoulder Instructions Bed Mobility Bed Mobility: Not assessed Transfers Transfers: Sit to Stand Sit to Stand: 4: Min assist;With upper extremity assist;From chair/3-in-1;From toilet Details for Transfer Assistance: vc's for hand placement       Exercises      Balance     End of Session OT - End of Session Activity Tolerance: Patient limited by fatigue Patient left: in chair;with call bell/phone within reach;with family/visitor present  GO     Tyler [name] M 06/06/2012, 2:06 PM

## 2012-06-06 NOTE — Progress Notes (Signed)
[facility] Daily Rounding Note 06/06/2012, 11:31 AM  SUBJECTIVE:       At 0400 had stool and flatus.  Belly feels less tight, still more distended than usual.  No  abd pain.  The GERD sxs are better.  Tolerating clear trays.  OBJECTIVE:         Vital signs in last 24 hours:    Temp:  [98 F (36.7 C)-98.4 F (36.9 C)] 98 F (36.7 C) (11/14 0433) Pulse Rate:  [63-90] 63  (11/13 2038) Resp:  [18-20] 18  (11/13 2038) BP: (143-162)/(66-77) 162/77 mmHg (11/14 0433) SpO2:  [96 %-100 %] 98 % (11/14 0756) Last BM Date: 06/03/12 General: looks well   Heart: RRR Chest: clear B.  No labored respiration Abdomen: less tense, thouhg not soft, no tenderness, tympanitic BS  Extremities: no pedal edema Neuro/Psych:  Pleasant, alert, relaxed.  No tremor.   Intake/Output from previous day: 11/13 0701 - 11/14 0700 In: 840 [P.O.:840] Out: 451 [Urine:450; Stool:1]  Intake/Output this shift:    Lab Results:  Basename 06/05/12 0530  WBC 5.3  HGB 10.2*  HCT 30.5*  PLT 184   BMET  Basename 06/06/12 0440 06/05/12 0530 06/04/12 0842  NA 134* 137 136  K 4.4 4.1 4.3  CL 100 102 101  CO2 27 26 28   GLUCOSE 119* 124* 183*  BUN 14 15 16   CREATININE 1.44* 1.40* 1.46*  CALCIUM 8.7 8.5 8.8    PT/INR  Basename 06/06/12 0440 06/05/12 0530  LABPROT 14.1 13.6  INR 1.10 1.05    Studies/Results: Dg Abd Portable 1v  06/05/2012    IMPRESSION: Concern for increased gaseous distention of the colon.  Findings may represent worsening ileus but a distal colonic obstruction cannot be excluded, particularly since the pelvic region is not imaged on this examination.  If there is clinical concern for colonic obstruction, suggest a contrast enema for further evaluation.   Original Report Authenticated By: Richarda Overlie, M.D.    Dg Colon W/water Sol Cm  06/05/2012   IMPRESSION: No evidence of colonic obstruction, focal mucosal lesion or volvulus.  The colon is diffusely dilated and elongated.   Original  Report Authenticated By: Carey Bullocks, M.D.     ASSESMENT: *  Ileus in pt with chronic constipation, bloating, immobility, Klebsiella UTI, resolved bradycardia.  No evidence for colonic obstruction on GG study 11/13. *  Long hx GERD.  sxs worse in setting of ileus.  *  Bradycardia.  *  Hyponatremia.  *  Renal insufficiency.  *  Gastroparesis.  *  Chronic globus sensation with extensive unrevealing workup in 2012.   *  Normocytic anemia. Had been on po Iron per SNF provider.  *  Untreated OSA, noncompliance.    PLAN:  *  Will allow full liquids.  KUB in AM.  mag , phos in AM as has been several days since these were checked.  *  Pacemaker planned for 11/15, Dr [name]   LOS: 8 days   Jennye Moccasin  06/06/2012, 11:31 AM Pager: [phone]   GI ATTENDING  X-rays, laboratories reviewed. Patient seen and examined. Agree with above. No obstruction on contrast enema. His abdominal exam is still abnormal, but improved. He has bowel sounds this afternoon and did move his bowels earlier today. He has Ogilvie's syndrome (colonic pseudoobstruction) due to his underlying chronic dysmotility. Treatment is as previously outlined. Correct underlying medical problems, maintain normal electrolytes, increased physical activity as is feasible, continue MiraLax, and diet as  tolerated. As long as the patient is tolerating his diet and moving his bowels, this is fine. Marked abdominal distention will remain.  Wilhemina Bonito. Eda Keys., M.D. [facility] Division of Gastroenterology

## 2012-06-07 ENCOUNTER — Encounter (HOSPITAL_COMMUNITY)
Admission: EM | Disposition: A | Discharge status: Skilled Nursing Facility | Payer: Self-pay | Source: Home / Self Care | Attending: Family Medicine

## 2012-06-07 ENCOUNTER — Inpatient Hospital Stay (HOSPITAL_COMMUNITY): Payer: Medicare Other

## 2012-06-07 DIAGNOSIS — I495 Sick sinus syndrome: Secondary | ICD-10-CM

## 2012-06-07 HISTORY — PX: PERMANENT PACEMAKER INSERTION: SHX5480

## 2012-06-07 LAB — BASIC METABOLIC PANEL
Calcium: 8.6 mg/dL (ref 8.4–10.5)
GFR calc Af Amer: 56 mL/min — ABNORMAL LOW (ref >90)
GFR calc non Af Amer: 49 mL/min — ABNORMAL LOW (ref >90)
Glucose, Bld: 113 mg/dL — ABNORMAL HIGH (ref 70–99)
Potassium: 4 mEq/L (ref 3.5–5.1)
Sodium: 134 mEq/L — ABNORMAL LOW (ref 135–145)

## 2012-06-07 LAB — GLUCOSE, CAPILLARY
Glucose-Capillary: 133 mg/dL — ABNORMAL HIGH (ref 70–99)
Glucose-Capillary: 136 mg/dL — ABNORMAL HIGH (ref 70–99)

## 2012-06-07 LAB — PROTIME-INR: INR: 1.17 (ref 0.00–1.49)

## 2012-06-07 SURGERY — PERMANENT PACEMAKER INSERTION
Anesthesia: LOCAL

## 2012-06-07 MED ORDER — SODIUM CHLORIDE 0.9 % IV SOLN: 250.0000 mL | INTRAVENOUS | Status: DC | PRN

## 2012-06-07 MED ORDER — FENTANYL CITRATE 0.05 MG/ML IJ SOLN
INTRAMUSCULAR | Status: AC
  Filled 2012-06-07: qty 2

## 2012-06-07 MED ORDER — CEFAZOLIN SODIUM 1-5 GM-% IV SOLN
1.0000 g | Freq: Four times a day (QID) | INTRAVENOUS | Status: AC
  Administered 2012-06-07 (×3): 1 g via INTRAVENOUS
  Filled 2012-06-07 (×3): qty 50

## 2012-06-07 MED ORDER — HYDROCODONE-ACETAMINOPHEN 5-325 MG PO TABS
1.0000 | ORAL_TABLET | ORAL | Status: DC | PRN
  Administered 2012-06-08: 2 via ORAL
  Filled 2012-06-07: qty 2

## 2012-06-07 MED ORDER — ACETAMINOPHEN 325 MG PO TABS: 325.0000 mg | ORAL_TABLET | ORAL | Status: DC | PRN

## 2012-06-07 MED ORDER — INSULIN GLARGINE 100 UNIT/ML ~~LOC~~ SOLN
20.0000 [IU] | Freq: Every day | SUBCUTANEOUS | Status: DC
  Administered 2012-06-07 – 2012-06-09 (×3): 20 [IU] via SUBCUTANEOUS

## 2012-06-07 MED ORDER — DEXTROSE 50 % IV SOLN
INTRAVENOUS | Status: AC
  Filled 2012-06-07: qty 50

## 2012-06-07 MED ORDER — CARVEDILOL 6.25 MG PO TABS
6.2500 mg | ORAL_TABLET | Freq: Two times a day (BID) | ORAL | Status: DC
  Administered 2012-06-07 – 2012-06-10 (×6): 6.25 mg via ORAL
  Filled 2012-06-07 (×8): qty 1

## 2012-06-07 MED ORDER — MIDAZOLAM HCL 5 MG/5ML IJ SOLN
INTRAMUSCULAR | Status: AC
  Filled 2012-06-07: qty 5

## 2012-06-07 MED ORDER — SODIUM CHLORIDE 0.9 % IJ SOLN
3.0000 mL | Freq: Two times a day (BID) | INTRAMUSCULAR | Status: DC
  Administered 2012-06-07 – 2012-06-10 (×3): 3 mL via INTRAVENOUS

## 2012-06-07 MED ORDER — ONDANSETRON HCL 4 MG/2ML IJ SOLN: 4.0000 mg | Freq: Four times a day (QID) | INTRAMUSCULAR | Status: DC | PRN

## 2012-06-07 MED ORDER — WARFARIN SODIUM 2.5 MG PO TABS
12.5000 mg | ORAL_TABLET | Freq: Once | ORAL | Status: AC
  Administered 2012-06-07: 12.5 mg via ORAL
  Filled 2012-06-07: qty 1

## 2012-06-07 MED ORDER — PANTOPRAZOLE SODIUM 40 MG PO TBEC
40.0000 mg | DELAYED_RELEASE_TABLET | Freq: Every day | ORAL | Status: DC
  Administered 2012-06-07 – 2012-06-10 (×4): 40 mg via ORAL
  Filled 2012-06-07 (×3): qty 1

## 2012-06-07 MED ORDER — LIDOCAINE HCL (PF) 1 % IJ SOLN
INTRAMUSCULAR | Status: AC
  Filled 2012-06-07: qty 60

## 2012-06-07 MED ORDER — SODIUM CHLORIDE 0.9 % IJ SOLN: 3.0000 mL | INTRAMUSCULAR | Status: DC | PRN

## 2012-06-07 MED ORDER — DEXTROSE 50 % IV SOLN
25.0000 mL | Freq: Once | INTRAVENOUS | Status: AC | PRN
  Administered 2012-06-07: 25 mL via INTRAVENOUS

## 2012-06-07 MED ORDER — WARFARIN SODIUM 5 MG PO TABS
5.0000 mg | ORAL_TABLET | Freq: Once | ORAL | Status: DC
  Filled 2012-06-07: qty 1

## 2012-06-07 MED ORDER — HEPARIN (PORCINE) IN NACL 2-0.9 UNIT/ML-% IJ SOLN
INTRAMUSCULAR | Status: AC
  Filled 2012-06-07: qty 500

## 2012-06-07 NOTE — Progress Notes (Signed)
PT Cancellation Note  Patient Details Name: Tyler [name] MRN: [id number] DOB: Jun 04, 1934   Cancelled Treatment:    Reason Eval/Treat Not Completed: Other (comment) (refuse, pacer placement this am)  06/07/2012  Eldon Bing, PT [phone] (pager)    Marquinn Meschke, Eliseo Gum 06/07/2012, 5:33 PM

## 2012-06-07 NOTE — Progress Notes (Signed)
[facility] Daily Rounding Note 06/07/2012, 9:26 AM  SUBJECTIVE:       Back from pacemaker insertion.  Small BM and flatus this AM.  Tolerating full liquids.  No nausea.  The heartburn is not as bad.  Feels bloated.   OBJECTIVE:         Vital signs in last 24 hours:    Temp:  [97.5 F (36.4 C)-98.4 F (36.9 C)] 98.4 F (36.9 C) (11/15 0540) Pulse Rate:  [67-80] 80  (11/15 0540) Resp:  [18-19] 19  (11/15 0540) BP: (143-156)/(53-55) 156/53 mmHg (11/15 0540) SpO2:  [98 %-99 %] 99 % (11/15 0540) Last BM Date: 06/06/12 General: pleasant, NAD   Heart: pacer bandaged on left upper chest, arm in sling. RRR Chest: clear, no resp distress.  Abdomen: protuberant, distended, not tender, mostly tense with softer areas in mid abdomen.  Tinkling, tympanitic BS  Extremities: no edema in feet Neuro/Psych:  Pleasant, sleepy but arouses easily.  No confusion or agitation.   Intake/Output from previous day: 11/14 0701 - 11/15 0700 In: 600 [P.O.:600] Out: 1925 [Urine:1925]  Intake/Output this shift:    Lab Results:  Basename 06/06/12 1731 06/05/12 0530  WBC 5.7 5.3  HGB 10.0* 10.2*  HCT 30.0* 30.5*  PLT 194 184   BMET  Basename 06/07/12 0230 06/06/12 0440 06/05/12 0530  NA 134* 134* 137  K 4.0 4.4 4.1  CL 102 100 102  CO2 27 27 26   GLUCOSE 113* 119* 124*  BUN 10 14 15   CREATININE 1.37* 1.44* 1.40*  CALCIUM 8.6 8.7 8.5   PT/INR  Basename 06/07/12 0152 06/06/12 0440  LABPROT 14.7 14.1  INR 1.17 1.10   Studies/Results: Dg Abd 1 View 06/07/2012  *RADIOLOGY REPORT*  Clinical Data: Abdominal pain, distention.  ABDOMEN - 1 VIEW  Comparison: 06/05/2012  Findings: Severe diffuse gaseous distention of the colon again noted.  Transverse colon again measures approximately 12 cm in diameter.  Rectosigmoid colon is less distended, but no definite evidence for an distal obstruction.  There is oral contrast material within the rectosigmoid colon.  No free air.  No organomegaly.   IMPRESSION: Severe gaseous distention of the colon, likely severe ileus.   Original Report Authenticated By: Charlett Nose, M.D.     ASSESMENT: *  Ogilvie's syndrome with persistent colonic ileus. Potassium, mag and phos levels normal.  On Miralax BID, 2 [place] daily.   *  Bradycardia.  For pacemaker today.  *  Klebsiella  UTI *  IDDM *  Stable normocytic anemia.   PLAN: *  ? Advance to low fiber, diabetic diet?   LOS: 9 days   Jennye Moccasin  06/07/2012, 9:26 AM Pager: [phone]   GI ATTENDING  Patient seen and examined. Her laboratories and x-rays reviewed. Agree with above. The patient is sleeping this afternoon after having had pacemaker earlier today. His abdomen is distended with decreased bowel sounds. However, no tenderness. X-ray looks as it did a few days ago. No obstruction.  Impression #1. Ogilvie's syndrome #2. Acute(UTI, bradycardia requiring pacemaker) and chronic (diabetes, motility disorder, etc.) medical problems  Recommendations #1. Continue to treat underlying acute medical problems to resolution #2. Keep electrolytes corrected #3. Increase activity as much as possible. This is most important #4. Continue IV Reglan until discharge #5. Continue MiraLax #6. Diet as tolerated #7. Stop getting abdominal films unless significant change in clinical status. These will always be abnormal  Will sign off. Available as needed.  Wilhemina Bonito. Eda Keys.,  M.D. [facility] Division of Gastroenterology

## 2012-06-07 NOTE — Progress Notes (Signed)
ANTICOAGULATION CONSULT NOTE - Initial Consult  Pharmacy Consult for Resume Coumadin Indication: PAF  No Known Allergies  Weight: 122 kg Height: 70.5 in IBW: 74 kg Heparin Dosing Weight: 100 kg Labs:  Basename 06/07/12 0230 06/07/12 0152 06/06/12 1731 06/06/12 0440 06/05/12 0530  HGB -- -- 10.0* -- 10.2*  HCT -- -- 30.0* -- 30.5*  PLT -- -- 194 -- 184  APTT -- -- -- -- --  LABPROT -- 14.7 -- 14.1 13.6  INR -- 1.17 -- 1.10 1.05  HEPARINUNFRC -- -- -- -- --  CREATININE 1.37* -- -- 1.44* 1.40*  CKTOTAL -- -- -- -- --  CKMB -- -- -- -- --  TROPONINI -- -- -- -- --    Estimated Creatinine Clearance: 60.5 ml/min (by C-G formula based on Cr of 1.37).   Assessment: 78 year old male on Coumadin PTA for PAF, admitted for bradycardia, junctional rhythm.  Coumadin on hold 11/14 for pacemaker 11/15. INR = 1.17 (subtherapeutic). Pharmacy asked to resume Coumadin tonight.  Dose PTA = 7.5 mg TTHSS, 10 mg MWF  Goal of Therapy:  INR 2-3 Monitor platelets by anticoagulation protocol: Yes   Plan:  1) Coumadin 12.5 mg po x 1 tonight. 2) Daily PT/INR.  Reece Leader, Pharm D 06/07/2012 7:04 PM

## 2012-06-07 NOTE — Op Note (Signed)
SURGEON:  [name], MD     PREPROCEDURE DIAGNOSIS:  Symptomatic Bradycardia, sick sinus syndrome    POSTPROCEDURE DIAGNOSIS:  Symptomatic Bradycardia, sick sinus syndrome     PROCEDURES:   1. Left upper extremity venography.   2. Pacemaker implantation.     INTRODUCTION: Tyler [name] is a 78 y.o. male  with a history of sick sinus syndrome who presents today for pacemaker implantation.  The patient reports intermittent episodes of dizziness over the past few months.  No reversible causes have been identified.  The patient therefore presents today for pacemaker implantation.     DESCRIPTION OF PROCEDURE:  Informed written consent was obtained, and the patient was brought to the electrophysiology lab in a fasting state.  The patient required no sedation for the procedure today.  The patients left chest was prepped and draped in the usual sterile fashion by the EP lab staff. The skin overlying the left deltopectoral region was infiltrated with lidocaine for local analgesia.  A 4-cm incision was made over the left deltopectoral region.  A left subcutaneous pacemaker pocket was fashioned using a combination of sharp and blunt dissection. Electrocautery was required to assure hemostasis.    Left Upper Extremity Venography: A venogram of the left upper extremity was performed, which revealed a large left cephalic vein, which emptied into a large left subclavian vein.  The left axillary vein was moderate in size.    RA/RV Lead Placement: The left axillary vein was therefore cannulated.  Through the left axillary vein, a Public house manager model [phone] (serial number [id number]) right atrial lead and a [facility] model 1946- [birth year] (serial number  [id number]) right ventricular lead were advanced with fluoroscopic visualization into the right atrial appendage and right ventricular apex positions respectively.  Initial atrial lead P- waves measured 3 mV with impedance of 411664 ohms and a threshold of  0.6 V at 0.5 msec.  Right ventricular lead R-waves measured 6.8 mV with an impedance of 585 ohms and a threshold of 0.3 V at 0.5 msec.  Both leads were secured to the pectoralis fascia using #2-0 silk over the suture sleeves.   Device Placement:  The leads were then connected to a [employer] DR RF model [id number] (serial number [id number]) pacemaker.  The pocket was irrigated with copious gentamicin solution.  The pacemaker was then placed into the pocket.  The pocket was then closed in 2 layers with 2.0 Vicryl suture for the subcutaneous and subcuticular layers.  Steri-Strips and a sterile dressing were then applied.  There were no early apparent complications.     CONCLUSIONS:   1. Successful implantation of a Industrial/product designer DR RF dual-chamber pacemaker for sick sinus syndrome  2. No early apparent complications.           [name], MD 06/07/2012 9:47 AM

## 2012-06-07 NOTE — Interval H&P Note (Signed)
History and Physical Interval Note:  06/07/2012 7:38 AM  Tyler [name]  has presented today for surgery, with the diagnosis of a  The various methods of treatment have been discussed with the patient and family. After consideration of risks, benefits and other options for treatment, the patient has consented to  Procedure(s) (LRB) with comments: PERMANENT PACEMAKER INSERTION (N/A) as a surgical intervention .  The patient's history has been reviewed, patient examined, no change in status, stable for surgery.  I have reviewed the patient's chart and labs.  Questions were answered to the patient's satisfaction.     [name]

## 2012-06-07 NOTE — H&P (View-Only) (Signed)
Patient: Tyler [name] Date of Encounter: 06/06/2012, 7:51 AM Admit date: 05/29/2012     Subjective  Mr. Flam reports intermittent "lightheadedness" overnight but feels it may be due to lack of sleep. He has been awake this AM since 4:00. He is not sure if he was lightheaded at ~0700 (at the time of his bradycardia). Nursing report states he was asymptomatic. He denies CP, SOB, palpitations.   Objective  Physical Exam: Vitals: BP 162/77  Pulse 63  Temp 98 F (36.7 C) (Oral)  Resp 18  Ht 5' 10.5" (1.791 m)  Wt 268 lb 15.4 oz (122 kg)  BMI 38.05 kg/m2  SpO2 100% General: Well developed, well appearing 78 year old male in no acute distress. Neck: Supple. JVD not elevated. Lungs: Clear bilaterally to auscultation without wheezes, rales, or rhonchi. Breathing is unlabored. Heart: RRR S1 S2 without murmurs, rubs, or gallops.  Abdomen: Soft, +distended. Extremities: No clubbing or cyanosis. No edema.  Distal pedal pulses are 2+ and equal bilaterally. Neuro: Alert and oriented X 3. Moves all extremities spontaneously. No focal deficits.  Intake/Output:  Intake/Output Summary (Last 24 hours) at 06/06/12 0751 Last data filed at 06/06/12 0600  Gross per 24 hour  Intake    840 ml  Output    451 ml  Net    389 ml    Inpatient Medications:     . aspirin EC  81 mg Oral Daily  . [COMPLETED] cephALEXin  500 mg Oral Q8H  . cromolyn  1 drop Both Eyes BID  . doxazosin  4 mg Oral QHS  . famotidine  40 mg Oral BID  . heparin subcutaneous  5,000 Units Subcutaneous Q8H  . insulin aspart  0-20 Units Subcutaneous TID WC  . insulin aspart  0-5 Units Subcutaneous QHS  . insulin glargine  44 Units Subcutaneous BID  . ipratropium  2 spray Nasal TID  . mometasone-formoterol  2 puff Inhalation BID  . multivitamin with minerals  1 tablet Oral Daily  . neomycin-bacitracin-polymyxin  1 application Topical Daily  . pantoprazole (PROTONIX) IV  40 mg Intravenous Q24H  . polyethylene  glycol  17 g Oral BID  . senna-docusate  2 tablet Oral Daily  . sertraline  25 mg Oral Daily  . sodium chloride  1 spray Nasal QID  . sodium chloride  3 mL Intravenous Q12H  . trolamine salicylate  1 application Topical TID  . Vitamin D (Ergocalciferol)  50,000 Units Oral Q7 days  . [COMPLETED] warfarin  12.5 mg Oral ONCE-1800  . Warfarin - Pharmacist Dosing Inpatient   Does not apply q1800  . [DISCONTINUED] simethicone  80 mg Oral QID  . [DISCONTINUED] simvastatin  20 mg Oral QHS      . lactated ringers 50 mL/hr at 06/05/12 1848    Labs:  [facility] 06/06/12 0440 06/05/12 0530  NA 134* 137  K 4.4 4.1  CL 100 102  CO2 27 26  GLUCOSE 119* 124*  BUN 14 15  CREATININE 1.44* 1.40*  CALCIUM 8.7 8.5  MG -- --  PHOS -- --    Basename 06/05/12 0530  WBC 5.3  NEUTROABS --  HGB 10.2*  HCT 30.5*  MCV 87.1  PLT 184    INR 1.1   Radiology/Studies: Dg Chest Port 1 View  05/29/2012  *RADIOLOGY REPORT*  Clinical Data: Bradycardia.  Diabetes.  Nausea.  PORTABLE CHEST - 1 VIEW  Comparison: 10/07/2010.  Findings: Low volume chest.  Apical lordotic projection. Defibrillator  pads project over the chest.  Monitoring leads are also present on the chest.  Allowing for the low volumes of inspiration, the heart lungs appear within normal limits.  IMPRESSION: Low volume chest.   Original Report Authenticated By: Andreas Newport, M.D.    Dg Abd Portable 1v  06/05/2012  *RADIOLOGY REPORT*  Clinical Data: Follow up ileus.  PORTABLE ABDOMEN - 1 VIEW  Comparison: 06/03/2012  Findings: Portable supine view of the abdomen again demonstrates marked gaseous distention of the colon.  There appears to be a large distended bowel loop in the left upper abdomen, measuring up to 12 cm.  This could be associated with transverse colon or sigmoid colon.  The pelvic region was not imaged.  IMPRESSION: Concern for increased gaseous distention of the colon.  Findings may represent worsening ileus but a distal colonic  obstruction cannot be excluded, particularly since the pelvic region is not imaged on this examination.  If there is clinical concern for colonic obstruction, suggest a contrast enema for further evaluation.   Original Report Authenticated By: Richarda Overlie, M.D.    Dg Colon W/water Sol Cm  06/05/2012  *RADIOLOGY REPORT*  Clinical Data: Distended abdomen with diffusely dilated colon. Question colonic obstruction.  WATER SOLUBLE CONTRAST ENEMA  Technique:  Initial scout AP supine abdominal image was not repeated, having been performed earlier today.  Water soluble contrast was introduced into the colon in a retrograde fashion and refluxed from the rectum to the cecum.  Spot images of the colon followed by overhead radiographs were obtained.  Fluoroscopy time: 1.37 minutes.  Comparison:  Abdominal radiographs 06/05/2012 and 06/03/2012. Abdominal pelvic CT 03/19/2008.  Findings:  The colon is diffusely distended, especially the sigmoid colon which is moderately distended and elongated.  Contrast refluxes freely through the sigmoid colon into the descending colon.  By turning the patient, I was able to maneuver the contrast into the transverse colon, and subsequently into the right colon. There is no evidence of colonic obstruction or perforation.  No focal mucosal lesions are identified.  Mucosal assessment is limited by the patient's size and water soluble technique.  A post drainage/post evacuation view shows mild improvement in the diffuse colonic distension.  IMPRESSION: No evidence of colonic obstruction, focal mucosal lesion or volvulus.  The colon is diffusely dilated and elongated.   Original Report Authenticated By: Carey Bullocks, M.D.     Telemetry: normal sinus rhythm currently in the 70s; this AM has had intermittent junctional bradycardia with rates ~40; no significant pauses    Assessment and Plan  1. Bradycardia - junctional; recurrent despite discontinuation of his AV nodal agents; will keep NPO  until seen by [name] for possible PPM 2. PAFib - stable off AV nodal agents; warfarin was restarted yesterday; INR 1.1 this AM 3. Chronic diastolic HF - euvolemic at this time; continue medical therapy  4. CKD  5. UTI - per primary team  6. OSA - noncompliant with CPAP; Mr. Parson episodes this AM occurred while awake so bradycardia less likely due to untreated OSA 7. Colonic dilatation/dysmotility - per primary team  Signed, EDMISTEN, BROOKE PA-C  I have seen, examined the patient, and reviewed the above assessment and plan.  Changes to above are made where necessary.  Pt with recurrence of sinus pauses and junctional escape rhythm.  He reports having some dizziness yesterday with this.  He remains off of his AV nodal agents.  The patient has symptomatic bradycardia.  I would therefore recommend pacemaker implantation at  this point.  Risks, benefits, alternatives to pacemaker implantation were discussed in detail with the patient today. The patient understands that the risks include but are not limited to bleeding, infection, pneumothorax, perforation, tamponade, vascular damage, renal failure, MI, stroke, death,  and lead dislodgement and wishes to proceed.  I will plan to proceed with PPM tomorrow am.  He can resume diet as per primary team today and I will place orders for the procedure tomorrow am.   Co Sign: [name], MD 06/06/2012 8:14 AM

## 2012-06-07 NOTE — Progress Notes (Signed)
[facility] Progress Note    Tyler [name] ZOX:[id number] DOB: [birth year] DOA: 05/29/2012 PCP: [name], MD  Brief narrative: Patient is a 78 year old man resident of an assisted living facility, history of chronic diastolic dysfunction history of paroxysmal atrial fibrillation on chronic Coumadin therapy, history of sick sinus syndrome, hypertension, diabetes who was transferred from the skilled nursing facility for evaluation of the above-noted complaints. Upon evaluation by EMS in the field, he was found to be bradycardic. ECG revealed a junctional rhythm. Patient claimed that he has been having intermittent dizziness, exertional dyspnea, headache, abdominal bloating intermittently for the past few months.  In the ED he was found to have a junctional rhythm. He also was found to have hyperkalemia. He was admitted to the step down unit for further monitoring and treatment.  On the morning of admission he had some left sided chest pain, that he cannot describe very well. There was no radiation of the pain. It was not clear whether this chest pain was associated with shortness of breath or nausea. He claimed that he could only ambulate around 10-15 feet before he gets short of breath, over the past few days he did claim that he gets lightheaded when he walks. He denied any fever, he denies any cough. He denies any vomiting. Denied any diarrhea. Asian had a relatively uneventful hospital stay however then was noted to have abdominal distention and a GI consult was requested 06/04/2012 which was performed by [name]. He was restarted on Reglan in the hospital and kept on liquid diet and eventually pass stool 06/06/2012 which was diarrhea-like in nature with some formation but no blood in it  Assessment/Plan:  Symptomatic Bradycardia: Junctional rhythm-known ATRIAL FIBRILLATION, PAROXYSMAL *Management per cardiology/EP-has a junctional escape rhythm on monitor-->Discussed this 11/13  with [name] who reviewed the patient on the morning of 06/07/1999 and pacemaker placed 06/07/2012. *Continue to hold carvedilol-unsure which is any rate controlling agents patient will be able to tolerate in the future *Daughter's report patient had similar problem back in 2010 and was evaluated by cardiologist in [place] who recommended a pacemaker- pt unfortunately was not able to follow up with him *History of cardiac catheterization in 2003 without any evidence of coronary disease  Ogilvie's syndrome *Multiple influencing factors: Diuretic use, beta blocker use, diabetes with likely gastroparesis, and decreased mobility *KUB reveals dilated bowel and retained stool-Contrast enema was negative-patient passed a stool 06/06/2012 however he still appears distended - no clear precipitating factor that we can reverse--encouraged to ambulate-rest per GI, appreciate input-note that GI has ordered a KUB-they have graduated his diet today and have signed off 06/07/2012.  HYPERTENSION On g, Doxazosin was tapered to 2 mg from 4 mg given as 1.7-10% risk of hypotension with this Patient was treated with iv Hydralazine from 05/31/12 until 06/04/12   CKD (chronic kidney disease) stage 3, GFR 30-59 ml/min *Likely has a degree of mild acute renal insufficiency due to dehydration and diminished perfusion related to ongoing symptomatic bradycardia prior to admission *Creatinine has remained stable at baseline ranges-start Lactated ringer 50 cc/hr-discontinued 11/15  Klebsiella UTI (lower urinary tract infection)/Leukocytosis *Urine culture demonstrates ampicillin resistant Klebsiella *pateint was started on Rocephin on 05/29/12 -06/04/12. Changed to po keflex to complete 06/06/12  *blood cultures ordered on 11/9--negative *Placed on perioperative cephalosporin 11/15 by cardiologist-will complete this course for 3 doses  Dehydration *Patient was on 2 diuretics prior to admission- which we held from  admission  *has been hydrated via IVF- continue for  now until taking full po-fluids discontinued as per above *Echocardiogram this admission reveals normal systolic function and parameters consistent with grade 2 diastolic dysfunction.  *Discussed with patient's family and they are unaware of any previous heart failure diagnosis or issues with chronic edema  Diabetes mellitus, type 2 - a1c 6.7 patient went hypoglycemic overnight and have discontinued all of his sliding scale insulin and cut his Lantus down from 44 twice a day to 20 units once daily. We will revisit this 11/16 a  HYPERLIPIDEMIA *Continue statin  GERD *Will discontinue Protonix in favor of H2 blocker since on antibiotics and wish to decrease risk of C. difficile colitis *GERD appears to be secondary to bowel dysmotility so patient has been instructed on eating 5-6 small meals daily  Hyperkalemia *Likely related to dehydration and the Aldactone the patient was taking prior to admission  Warfarin anticoagulation *For paroxysmal atrial fibrillation *Coumadin was placed on hold for possible pacer placement.  Obesity/OSA (obstructive sleep apnea)-noncompliant with CPAP *Bradycardia and sinus pauses occurred while patient was awake so doubtful sleep apnea contributing to the symptoms  DVT prophylaxis: heparin Code Status: Full Family Communication: patient at bedside  Disposition Plan: back to alf, potentially 11/15 am  Consultants: Cardiology Electrophysiology GI  Procedures: None  Antibiotics: Rocephin 11/6 >>>  HPI/Subjective: Feels good.  Seen after PPM placed.  [place] when he can eat  States he has no specific c/o at present like Cp/or sob.  DOes feel somewhat distended and occasional N.  NO vomiting however   Objective: Blood pressure 145/58, pulse 82, temperature 98.2 F (36.8 C), temperature source Oral, resp. rate 18, height 5' 10.5" (1.791 m), weight 122 kg (268 lb 15.4 oz), SpO2  98.00%.  Intake/Output Summary (Last 24 hours) at 06/07/12 1727 Last data filed at 06/07/12 1322  Gross per 24 hour  Intake    650 ml  Output   1925 ml  Net  -1275 ml     Exam: General: No acute respiratory distress Lungs: Clear to auscultation bilaterally without wheezes or crackles, nasal cannula oxygen with saturations 100% Cardiovascular: Irregular rate and rhythm without murmur gallop or rub normal S1 and S2-NSR Abdomen: Nontender, very distended and tympanic, firm, bowel sounds decreased, no rebound, no ascites, no appreciable mass Musculoskeletal: No significant cyanosis, clubbing of bilateral lower extremities Neurological: Patient is awake and alert, oriented times name place and time of day but not to year. Moves all extremities x4 weekly, exam non-focal  Data Reviewed: Basic Metabolic Panel:  Lab 06/07/12 8413 06/06/12 0440 06/05/12 0530 06/04/12 0842 06/03/12 0500  NA 134* 134* 137 136 136  K 4.0 4.4 4.1 4.3 4.5  CL 102 100 102 101 101  CO2 27 27 26 28 25   GLUCOSE 113* 119* 124* 183* 327*  BUN 10 14 15 16 17   CREATININE 1.37* 1.44* 1.40* 1.46* 1.42*  CALCIUM 8.6 8.7 8.5 8.8 8.6  MG 2.4 -- -- -- --  PHOS 2.9 -- -- -- --   Liver Function Tests: No results found for this basename: AST:5,ALT:5,ALKPHOS:5,BILITOT:5,PROT:5,ALBUMIN:5 in the last 168 hours CBC:  Lab 06/06/12 1731 06/05/12 0530  WBC 5.7 5.3  NEUTROABS 3.8 --  HGB 10.0* 10.2*  HCT 30.0* 30.5*  MCV 87.7 87.1  PLT 194 184   Cardiac Enzymes: No results found for this basename: CKTOTAL:5,CKMB:5,CKMBINDEX:5,TROPONINI:5 in the last 168 hours BNP (last 3 results) No results found for this basename: PROBNP:3 in the last 8760 hours CBG:  Lab 06/07/12 1538 06/07/12 1057 06/07/12 0828 06/07/12  1610 06/07/12 0622  GLUCAP 133* 69* 76 105* 47*    Recent Results (from the past 240 hour(s))  URINE CULTURE     Status: Normal   Collection Time   05/29/12  2:38 PM      Component Value Range Status Comment    Specimen Description URINE, CLEAN CATCH   Final    Special Requests NONE   Final    Culture  Setup Time 05/29/2012 15:43   Final    Colony Count >=100,000 COLONIES/ML   Final    Culture KLEBSIELLA PNEUMONIAE   Final    Report Status 05/31/2012 FINAL   Final    Organism ID, Bacteria KLEBSIELLA PNEUMONIAE   Final   MRSA PCR SCREENING     Status: Normal   Collection Time   05/29/12  5:38 PM      Component Value Range Status Comment   MRSA by PCR NEGATIVE  NEGATIVE Final   CULTURE, BLOOD (ROUTINE X 2)     Status: Normal   Collection Time   05/31/12 12:16 PM      Component Value Range Status Comment   Specimen Description BLOOD RIGHT THUMB   Final    Special Requests BOTTLES DRAWN AEROBIC ONLY 5.5CC   Final    Culture  Setup Time 05/31/2012 18:57   Final    Culture NO GROWTH 5 DAYS   Final    Report Status 06/06/2012 FINAL   Final   CULTURE, BLOOD (ROUTINE X 2)     Status: Normal   Collection Time   05/31/12 12:27 PM      Component Value Range Status Comment   Specimen Description BLOOD LEFT HAND   Final    Special Requests BOTTLES DRAWN AEROBIC ONLY [facility]   Final    Culture  Setup Time 05/31/2012 18:57   Final    Culture NO GROWTH 5 DAYS   Final    Report Status 06/06/2012 FINAL   Final      Studies:  Recent x-ray studies have been reviewed in detail by the Attending Physician  Scheduled Meds:     . aspirin EC  81 mg Oral Daily  . carvedilol  6.25 mg Oral BID WC  .  ceFAZolin (ANCEF) IV  1 g Intravenous Q6H  . [COMPLETED] chlorhexidine  60 mL Topical Once  . cromolyn  1 drop Both Eyes BID  . doxazosin  2 mg Oral QHS  . famotidine  40 mg Oral BID  . [COMPLETED] fentaNYL      . [COMPLETED] heparin      . [COMPLETED] heparin  2,000 Units Intravenous Once  . insulin glargine  20 Units Subcutaneous Daily  . ipratropium  2 spray Nasal TID  . [COMPLETED] lidocaine      . [COMPLETED] midazolam      . mometasone-formoterol  2 puff Inhalation BID  . multivitamin with minerals  1  tablet Oral Daily  . neomycin-bacitracin-polymyxin  1 application Topical Daily  . pantoprazole  40 mg Oral Daily  . polyethylene glycol  17 g Oral BID  . senna-docusate  2 tablet Oral Daily  . sertraline  25 mg Oral Daily  . sodium chloride  1 spray Nasal QID  . sodium chloride  3 mL Intravenous Q12H  . trolamine salicylate  1 application Topical TID  . Vitamin D (Ergocalciferol)  50,000 Units Oral Q7 days  . Warfarin - Pharmacist Dosing Inpatient   Does not apply q1800  . [DISCONTINUED]  ceFAZolin (ANCEF)  IV  3 g Intravenous On Call  . [DISCONTINUED] chlorhexidine  60 mL Topical Once  . [DISCONTINUED] doxazosin  4 mg Oral QHS  . [DISCONTINUED] gentamicin irrigation  80 mg Irrigation On Call  . [DISCONTINUED] heparin subcutaneous  5,000 Units Subcutaneous Q8H  . [DISCONTINUED] insulin aspart  0-20 Units Subcutaneous TID WC  . [DISCONTINUED] insulin aspart  0-5 Units Subcutaneous QHS  . [DISCONTINUED] insulin glargine  44 Units Subcutaneous BID  . [DISCONTINUED] pantoprazole (PROTONIX) IV  40 mg Intravenous Q24H  . [DISCONTINUED] sodium chloride  3 mL Intravenous Q12H      Rhetta Mura [phone]  If 7PM-7AM, please contact night-coverage [email or web address] Password [email or web address] 06/07/2012, 5:27 PM   LOS: 9 days

## 2012-06-08 ENCOUNTER — Inpatient Hospital Stay (HOSPITAL_COMMUNITY): Payer: Medicare Other

## 2012-06-08 LAB — PROTIME-INR
INR: 1.19 (ref 0.00–1.49)
Prothrombin Time: 14.9 seconds (ref 11.6–15.2)

## 2012-06-08 LAB — CBC
MCH: 28.7 pg (ref 26.0–34.0)
MCHC: 32.1 g/dL (ref 30.0–36.0)
RDW: 12.6 % (ref 11.5–15.5)

## 2012-06-08 LAB — GLUCOSE, CAPILLARY: Glucose-Capillary: 263 mg/dL — ABNORMAL HIGH (ref 70–99)

## 2012-06-08 MED ORDER — DOXAZOSIN MESYLATE 4 MG PO TABS: 2.0000 mg | ORAL_TABLET | Freq: Every day | ORAL | Status: DC

## 2012-06-08 MED ORDER — MUSCLE RUB 10-15 % EX CREA
TOPICAL_CREAM | Freq: Three times a day (TID) | CUTANEOUS | Status: DC
  Administered 2012-06-09: 16:00:00 via TOPICAL
  Administered 2012-06-09: 1 via TOPICAL
  Administered 2012-06-09 – 2012-06-10 (×2): via TOPICAL
  Filled 2012-06-08: qty 85

## 2012-06-08 MED ORDER — WARFARIN SODIUM 7.5 MG PO TABS
15.0000 mg | ORAL_TABLET | Freq: Once | ORAL | Status: AC
  Administered 2012-06-08: 15 mg via ORAL
  Filled 2012-06-08: qty 2

## 2012-06-08 MED ORDER — INSULIN GLARGINE 100 UNIT/ML ~~LOC~~ SOLN: 20.0000 [IU] | Freq: Every day | SUBCUTANEOUS | Status: DC

## 2012-06-08 NOTE — Discharge Summary (Signed)
Physician Discharge Summary  Tyler [name] ZOX:[id number] DOB: [birth year] DOA: 05/29/2012  PCP: Tyler Inch, MD  Admit date: 05/29/2012 Discharge date: 06/08/2012  Time spent: 40 minutes  Recommendations for Outpatient Follow-up:  1. Recommend INR in 3 days + complete blood count and comprehensive metabolic panel-INR was subtherapeutic on discharge at 1.19 but should be relatively stable given he has a pacemaker in situ now 2. Recommend individualization of the patient's Lantus dosing-patient was slightly low on his blood sugars on the latter days of his admission and has only been discharged to the nursing home on Lantus 20. He previously was on Lantus 45 twice a day 3. Recommend close gastroenterology followup as an outpatient for Ogilvie's--this is a chronic issue and should not interfere with patient eating 4. Dr. Daleen [name] of Cardiology will arrange close pacemaker check and followup-keep scheduled appointment with Dr. Johney [name] 07/19/2012 5. Patient's diuretics have been discontinued from prior admission-EF on this admission was 60%-no indication for Aldactone. Would individualize his diuretic dosing as an outpatient with Lasix 20 mg maybe every other day-this has not been ordered. Please get daily weights at the nursing facility. 6. Patient was treated with a full course of antibiotics for UTI   Discharge Diagnoses:  Principal Problem:  *Junctional rhythm Active Problems:  HYPERLIPIDEMIA  HYPERTENSION  ATRIAL FIBRILLATION, PAROXYSMAL  GERD  Bradycardia  CKD (chronic kidney disease) stage 3, GFR 30-59 ml/min  Hyperkalemia  UTI (lower urinary tract infection)  Constipation, chronic  Dehydration  Diabetes mellitus, type 2  Warfarin anticoagulation  Obesity  OSA (obstructive sleep apnea)-noncompliant with CPAP  Ogilvie's syndrome   Discharge Condition: stable  Diet recommendation: High-fiber low-residue diabetic heart healthy diet  Filed Weights   06/01/12 0500 06/02/12  0521 06/03/12 0500  Weight: 119.8 kg (264 lb 1.8 oz) 121.5 kg (267 lb 13.7 oz) 122 kg (268 lb 15.4 oz)    History of present illness:  Patient is a 78 year old man resident of an assisted living facility, history of chronic diastolic dysfunction history of paroxysmal atrial fibrillation on chronic Coumadin therapy, history of sick sinus syndrome, hypertension, diabetes who was transferred from the skilled nursing facility for evaluation of the above-noted complaints. Upon evaluation by EMS in the field, he was found to be bradycardic. ECG revealed a junctional rhythm. Patient claimed that he has been having intermittent dizziness, exertional dyspnea, headache, abdominal bloating intermittently for the past few months.  In the ED he was found to have a junctional rhythm. He also was found to have hyperkalemia. He was admitted to the step down unit for further monitoring and treatment.  On the morning of admission he had some left sided chest pain, that he cannot describe very well. There was no radiation of the pain. It was not clear whether this chest pain was associated with shortness of breath or nausea. He claimed that he could only ambulate around 10-15 feet before he gets short of breath, over the past few days he did claim that he gets lightheaded when he walks. He denied any fever, he denies any cough. He denies any vomiting. Denied any diarrhea.  He had a relatively uneventful hospital stay however then was noted to have abdominal distention and a GI consult was requested 06/04/2012 which was performed by Dr. Marina [name]. He was restarted on Reglan in the hospital and kept on liquid diet and eventually pass stool 06/06/2012 which was diarrhea-like in nature with some formation but no blood in it-gastroenterology saw him and ultimately stated after multiple serial  x-rays and him passing small stools and flatus that this is a chronic issue but this will need to be closely monitored. Please see below for rest  of hospital visit and stay   Hospital Course:  Symptomatic Bradycardia: Junctional rhythm-known ATRIAL FIBRILLATION, PAROXYSMAL-Sick sinus syndrome *Management per cardiology/EP-has a junctional escape rhythm on monitor-->Discussed this 11/13 with Dr. Johney [name] who reviewed the patient on the morning of 06/07/1999 and pacemaker placed 06/07/2012-patient will have close followup with cardiology per above and should not raise his left arm above his head until then *Continue to hold carvedilol-unsure which is any rate controlling agents patient will be able to tolerate in the future  *Daughter's report patient had similar problem back in 2010 and was evaluated by cardiologist in [place] who recommended a pacemaker- pt unfortunately was not able to follow up with him  *History of cardiac catheterization in 2003 without any evidence of coronary disease   Ogilvie's syndrome  *Multiple influencing factors: Diuretic use, beta blocker use, diabetes with likely gastroparesis, and decreased mobility  *KUB reveals dilated bowel and retained stool-Contrast enema was negative-patient passed a stool 06/06/2012 however he still appears distended  - no clear precipitating factor that we can reverse--encouraged to ambulate-rest per GI, appreciate input-note that GI has ordered a KUB-they have graduated his diet and have signed off 06/07/2012.  *Would recommend a follow up visit if this continues to be a concern. Would minimize opiates and other medications.  HYPERTENSION  On g, Doxazosin was tapered to 2 mg from 4 mg given as 1.7-10% risk of hypotension with this  Patient was treated with iv Hydralazine from 05/31/12 until 06/04/12   CKD (chronic kidney disease) stage 3, GFR 30-59 ml/min  *Likely has a degree of mild acute renal insufficiency due to dehydration and diminished perfusion related to ongoing symptomatic bradycardia prior to admission  *Creatinine has remained stable at baseline ranges-start Lactated  ringer 50 cc/hr-discontinued 11/15   Klebsiella UTI (lower urinary tract infection)/Leukocytosis  *Urine culture demonstrates ampicillin resistant Klebsiella  *pateint was started on Rocephin on 05/29/12 -06/04/12. Changed to po keflex to complete 06/06/12  *blood cultures ordered on 11/9--negative   Dehydration  *Patient was on 2 diuretics prior to admission- which we held from admission  *has been hydrated via IVF- continue for now until taking full po-fluids discontinued as per above  *Echocardiogram this admission reveals normal systolic function and parameters consistent with grade 2 diastolic dysfunction.  *Discussed with patient's family and they are unaware of any previous heart failure diagnosis or issues with chronic edema   Diabetes mellitus, type 2  - a1c 6.7 patient went hypoglycemic overnight and have discontinued all of his sliding scale insulin and cut his Lantus down from 44 twice a day to 20 units once daily. We will revisit this 11/16-S. blood sugars have been still borderline low with just Lantus see above  Grade 2 diastolic dysfunction with preserved EF-no rolecurrently for Aldactone given lack of mortality benefit in preserved EF Recommend individualized in his dose of diuretic-this has not been ordered. Recommend daily weights  HYPERLIPIDEMIA  *Continue statin   GERD  *Will discontinue Protonix in favor of H2 blocker since on antibiotics and wish to decrease risk of C. difficile colitis  *GERD appears to be secondary to bowel dysmotility so patient has been instructed on eating 5-6 small meals daily   Hyperkalemia  *Likely related to dehydration and the Aldactone the patient was taking prior to admission   Warfarin anticoagulation  *For paroxysmal atrial  fibrillation  *Coumadin was placed on hold for possible pacer placement-resumed 11/15-see above  Obesity/OSA (obstructive sleep apnea)-noncompliant with CPAP  *Bradycardia and sinus pauses occurred while  patient was awake so doubtful sleep apnea contributing to the symptoms   Consultants:  Cardiology  Electrophysiology  GI   Procedures:   Pacemaker placed 06/07/2012, [employer] RF dual-chamber pacemaker for sick sinus syndrome   Antibiotics:  Rocephin 11/6 >>11/11 Keflex 11/12 >>11/13  Rocephin 11/6 >>>11/17   Discharge Exam: Filed Vitals:   06/07/12 1310 06/07/12 2136 06/07/12 2141 06/08/12 0637  BP: 145/58 145/70  160/70  Pulse: 82 79  67  Temp:  99.1 F (37.3 C)    TempSrc:  Oral  Oral  Resp:  19  20  Height:      Weight:      SpO2:  93% 95% 96%   Do well no new issues-tolerated most of breakfast.  No gas this morning Small stool yesterday Denies chest pain or shortness of breath   General: Pleasant no acute distress Cardiovascular: Clinically clear Respiratory: No tactile vocal resonance or fremitus Abdomen obese but less firm less distended  Discharge Instructions  Discharge Orders    Future Appointments: Provider: Department: Dept Phone: Center:   07/19/2012 3:15 PM [name], MD [facility] Main Office [place]) [phone] [id number]     Future Orders Please Complete By Expires   Diet - low sodium heart healthy      Increase activity slowly      Discharge instructions      Comments:   Do not raise Left arm above head until seen by EP doctor-Dr. Daleen [name] will ensure follow up for pacer check is made sooner   Lifting restrictions      Comments:   See above   Call MD for:  persistant nausea and vomiting      Call MD for:  redness, tenderness, or signs of infection (pain, swelling, redness, odor or green/yellow discharge around incision site)      Call MD for:  hives      ACE Inhibitor / ARB already ordered          Medication List     As of 06/08/2012 10:02 AM    STOP taking these medications         carvedilol 6.25 MG tablet   Commonly known as: COREG      dextrose 40 % Gel   Commonly known as: GLUTOSE       insulin aspart 100 UNIT/ML injection   Commonly known as: novoLOG      INSULIN SYRINGE 1CC/29G 29G X 1/2" 1 ML Misc      polyethylene glycol powder powder   Commonly known as: GLYCOLAX/MIRALAX      simethicone 80 MG chewable tablet   Commonly known as: MYLICON      spironolactone 25 MG tablet   Commonly known as: ALDACTONE      TAKE these medications         acetaminophen 500 MG tablet   Commonly known as: TYLENOL   Take 1,000 mg by mouth every 6 (six) hours as needed. For severe headache      BEPREVE 1.5 % Soln   Generic drug: Bepotastine Besilate   Place 1 drop into both eyes 2 (two) times daily.      cycloSPORINE 0.05 % ophthalmic emulsion   Commonly known as: RESTASIS   Place 1 drop into both eyes 2 (two) times daily.  donepezil 10 MG tablet   Commonly known as: ARICEPT   Take 10 mg by mouth at bedtime.      doxazosin 4 MG tablet   Commonly known as: CARDURA   Take 0.5 tablets (2 mg total) by mouth at bedtime.      famotidine 40 MG tablet   Commonly known as: PEPCID   Take 40 mg by mouth at bedtime.      ferrous sulfate 325 (65 FE) MG tablet   Take 325 mg by mouth daily with breakfast.      fexofenadine 180 MG tablet   Commonly known as: ALLEGRA   Take 180 mg by mouth daily as needed. For seasonal allergies      Fluticasone-Salmeterol 100-50 MCG/DOSE Aepb   Commonly known as: ADVAIR   Inhale 1 puff into the lungs 2 (two) times daily. Rinse mouth and spit after each use      hydroxypropyl methylcellulose 2.5 % ophthalmic solution   Commonly known as: ISOPTO TEARS   Place 1 drop into both eyes every 2 (two) hours as needed. For dry eyes      insulin glargine 100 UNIT/ML injection   Commonly known as: LANTUS   Inject 20 Units into the skin at bedtime.      ipratropium 0.06 % nasal spray   Commonly known as: ATROVENT   Place 2 sprays into the nose 3 (three) times daily.      metFORMIN 750 MG 24 hr tablet   Commonly known as: GLUCOPHAGE-XR   Take  750 mg by mouth daily with breakfast.      metoCLOPramide 10 MG tablet   Commonly known as: REGLAN   Take 0.5 tablets (5 mg total) by mouth at bedtime.      multivitamin with minerals Tabs   Take 1 tablet by mouth every morning.      neomycin-bacitracin-polymyxin Oint   Commonly known as: NEOSPORIN   Apply 1 application topically daily. To left big toe      pantoprazole 40 MG tablet   Commonly known as: PROTONIX   Take 40 mg by mouth 2 (two) times daily.      sennosides-docusate sodium 8.6-50 MG tablet   Commonly known as: SENOKOT-S   Take 2 tablets by mouth at bedtime as needed. For constipation      sertraline 25 MG tablet   Commonly known as: ZOLOFT   Take 25 mg by mouth daily.      simvastatin 20 MG tablet   Commonly known as: ZOCOR   Take 20 mg by mouth at bedtime.      sodium chloride 0.65 % Soln nasal spray   Commonly known as: OCEAN   Place 1 spray into the nose 4 (four) times daily.      torsemide 100 MG tablet   Commonly known as: DEMADEX   Take 50 mg by mouth daily.      trolamine salicylate 10 % cream   Commonly known as: ASPERCREME   Apply 1 application topically 3 (three) times daily. To left shoulder      VENTOLIN HFA 108 (90 BASE) MCG/ACT inhaler   Generic drug: albuterol   Inhale 2 puffs into the lungs every 4 (four) hours as needed. For shortness of breath      Vitamin D (Ergocalciferol) 50000 UNITS Caps   Commonly known as: DRISDOL   Take 50,000 Units by mouth every 7 (seven) days. Fridays      warfarin 5 MG tablet   Commonly  known as: COUMADIN   Take 7.5-10 mg by mouth daily. Take 2 tablets (=10mg ) on Monday, Wednesday, and Friday.  Take 1.5 tablet (=7.5mg ) on Tuesday, Thursday, Saturday, and sunday            Follow-up Information    Follow up with [name], MD. On 07/19/2012. (At 3:15 PM)    Contact information:   [address] [place] [address] [phone]      Follow up with Tyler Inch, MD. In 1  month.   Contact information:   [address] [place] [address] [phone]           The results of significant diagnostics from this hospitalization (including imaging, microbiology, ancillary and laboratory) are listed below for reference.    Significant Diagnostic Studies: Dg Chest 2 View  06/08/2012  *RADIOLOGY REPORT*  Clinical Data: Pacemaker implantation.  CHEST - 2 VIEW  Comparison: 05/29/2012  Findings: Two views of the chest demonstrate placement of a left- sided cardiac pacemaker.  There is a lead in the region of the right ventricle and the other is near the cavoatrial junction.  Low lung volumes and slightly prominent interstitial lung markings.  No evidence for a pneumothorax.  The lateral view raises concern for a small right pleural effusion.  Difficult to exclude a small left pleural effusion.  IMPRESSION: Placement of left cardiac pacemaker without a pneumothorax.  Possible small pleural effusions.   Original Report Authenticated By: Richarda Overlie, M.D.    Dg Abd 1 View  06/07/2012  *RADIOLOGY REPORT*  Clinical Data: Abdominal pain, distention.  ABDOMEN - 1 VIEW  Comparison: 06/05/2012  Findings: Severe diffuse gaseous distention of the colon again noted.  Transverse colon again measures approximately 12 cm in diameter.  Rectosigmoid colon is less distended, but no definite evidence for an distal obstruction.  There is oral contrast material within the rectosigmoid colon.  No free air.  No organomegaly.  IMPRESSION: Severe gaseous distention of the colon, likely severe ileus.   Original Report Authenticated By: Charlett Nose, M.D.    Dg Chest Port 1 View  05/29/2012  *RADIOLOGY REPORT*  Clinical Data: Bradycardia.  Diabetes.  Nausea.  PORTABLE CHEST - 1 VIEW  Comparison: 10/07/2010.  Findings: Low volume chest.  Apical lordotic projection. Defibrillator pads project over the chest.  Monitoring leads are also present on the chest.  Allowing for the low volumes of  inspiration, the heart lungs appear within normal limits.  IMPRESSION: Low volume chest.   Original Report Authenticated By: Andreas Newport, M.D.    Dg Abd Portable 1v  06/05/2012  *RADIOLOGY REPORT*  Clinical Data: Follow up ileus.  PORTABLE ABDOMEN - 1 VIEW  Comparison: 06/03/2012  Findings: Portable supine view of the abdomen again demonstrates marked gaseous distention of the colon.  There appears to be a large distended bowel loop in the left upper abdomen, measuring up to 12 cm.  This could be associated with transverse colon or sigmoid colon.  The pelvic region was not imaged.  IMPRESSION: Concern for increased gaseous distention of the colon.  Findings may represent worsening ileus but a distal colonic obstruction cannot be excluded, particularly since the pelvic region is not imaged on this examination.  If there is clinical concern for colonic obstruction, suggest a contrast enema for further evaluation.   Original Report Authenticated By: Richarda Overlie, M.D.    Dg Abd Portable 1v  06/03/2012  *RADIOLOGY REPORT*  Clinical Data: Colonic distention  PORTABLE  ABDOMEN - 1 VIEW  Comparison: 06/02/2012  Findings: Gaseous colonic distention persists without substantial interval change.  There is some mild gaseous small bowel distention associated as well.  Rectal tube overlies the lower midline pelvis. Visualized bony structures are unremarkable.  IMPRESSION: No substantial change in gaseous distention of the colon.   Original Report Authenticated By: Kennith Center, M.D.    Dg Abd Portable 1v  06/02/2012  *RADIOLOGY REPORT*  Clinical Data: Constipation/abdominal distension.  PORTABLE ABDOMEN - 1 VIEW  Comparison: 05/30/2012  Findings: There are multiple air-filled loops of large and small bowel with continued prominence of a colonic loop in the left abdomen.  No definite free air or air-fluid levels.  Remainder of the exam is unchanged.  IMPRESSION: Continued air filled loops of large and small bowel with  mild prominence of a colonic loop in the left abdomen.  Findings are likely due to ileus.  Recommend follow up as clinically indicated.   Original Report Authenticated By: Elberta Fortis, M.D.    Dg Abd Portable 1v  05/30/2012  *RADIOLOGY REPORT*  Clinical Data: History of abdominal distention.  History of small amount of bowel movements the last couple of days.  Nausea. History of constipation.  History of urinary tract infections.  PORTABLE ABDOMEN - 1 VIEW  Comparison: 05/29/2012.  Findings: There are multiple gas-filled loops of bowel which appear very dilated.  These appear to be colonic.  There is some gas within the rectum.  The rectum is not dilated.  No significant small-bowel dilatation cannot be appreciated.  There is mild gaseous distention of the mid and distal portion of the stomach. Fecal burden is small.  No opaque calculi are seen.  There is degenerative spondylosis.  IMPRESSION: Multiple gas-filled loops of intestine which appear dilated and appear to be colonic.  There is gas within the nondilated rectum. The most distended loop is in the lower left abdomen.  It was distended on the previous study to a caliber of 8.6 cm.  On the current study it is distended up to caliber of 10 cm.  No colonic wall edema or pneumatosis is evident. Fecal burden appears small. Colonic partial obstruction cannot be excluded.   Original Report Authenticated By: Onalee Hua Call    Dg Abd Portable 2v  05/29/2012  *RADIOLOGY REPORT*  Clinical Data: Chest pain.  Abdominal pain.  PORTABLE ABDOMEN - 2 VIEW  Comparison: 03/19/2008 CT.  Findings: Nonobstructive bowel gas pattern is present.  There is gaseous distention of colon in the left mid abdomen, likely within redundant sigmoid.  No gross plain film evidence of free air.  Prominent stool is present in the right colon.  Monitoring leads project over the lower chest along with a defibrillator pad.  Gas is present in the anatomic pelvis however the rectum is not visualized  on these two views.  IMPRESSION: Gaseous distention of the colon.  Overall bowel gas pattern is nonobstructive.   Original Report Authenticated By: Andreas Newport, M.D.    Dg Colon W/water Sol Cm  06/05/2012  *RADIOLOGY REPORT*  Clinical Data: Distended abdomen with diffusely dilated colon. Question colonic obstruction.  WATER SOLUBLE CONTRAST ENEMA  Technique:  Initial scout AP supine abdominal image was not repeated, having been performed earlier today.  Water soluble contrast was introduced into the colon in a retrograde fashion and refluxed from the rectum to the cecum.  Spot images of the colon followed by overhead radiographs were obtained.  Fluoroscopy time: 1.37 minutes.  Comparison:  Abdominal radiographs  06/05/2012 and 06/03/2012. Abdominal pelvic CT 03/19/2008.  Findings:  The colon is diffusely distended, especially the sigmoid colon which is moderately distended and elongated.  Contrast refluxes freely through the sigmoid colon into the descending colon.  By turning the patient, I was able to maneuver the contrast into the transverse colon, and subsequently into the right colon. There is no evidence of colonic obstruction or perforation.  No focal mucosal lesions are identified.  Mucosal assessment is limited by the patient's size and water soluble technique.  A post drainage/post evacuation view shows mild improvement in the diffuse colonic distension.  IMPRESSION: No evidence of colonic obstruction, focal mucosal lesion or volvulus.  The colon is diffusely dilated and elongated.   Original Report Authenticated By: Carey Bullocks, M.D.     Microbiology: Recent Results (from the past 240 hour(s))  URINE CULTURE     Status: Normal   Collection Time   05/29/12  2:38 PM      Component Value Range Status Comment   Specimen Description URINE, CLEAN CATCH   Final    Special Requests NONE   Final    Culture  Setup Time 05/29/2012 15:43   Final    Colony Count >=100,000 COLONIES/ML   Final     Culture KLEBSIELLA PNEUMONIAE   Final    Report Status 05/31/2012 FINAL   Final    Organism ID, Bacteria KLEBSIELLA PNEUMONIAE   Final   MRSA PCR SCREENING     Status: Normal   Collection Time   05/29/12  5:38 PM      Component Value Range Status Comment   MRSA by PCR NEGATIVE  NEGATIVE Final   CULTURE, BLOOD (ROUTINE X 2)     Status: Normal   Collection Time   05/31/12 12:16 PM      Component Value Range Status Comment   Specimen Description BLOOD RIGHT THUMB   Final    Special Requests BOTTLES DRAWN AEROBIC ONLY 5.5CC   Final    Culture  Setup Time 05/31/2012 18:57   Final    Culture NO GROWTH 5 DAYS   Final    Report Status 06/06/2012 FINAL   Final   CULTURE, BLOOD (ROUTINE X 2)     Status: Normal   Collection Time   05/31/12 12:27 PM      Component Value Range Status Comment   Specimen Description BLOOD LEFT HAND   Final    Special Requests BOTTLES DRAWN AEROBIC ONLY [facility]   Final    Culture  Setup Time 05/31/2012 18:57   Final    Culture NO GROWTH 5 DAYS   Final    Report Status 06/06/2012 FINAL   Final      Labs: Basic Metabolic Panel:  Lab 06/07/12 1610 06/06/12 0440 06/05/12 0530 06/04/12 0842 06/03/12 0500  NA 134* 134* 137 136 136  K 4.0 4.4 4.1 4.3 4.5  CL 102 100 102 101 101  CO2 27 27 26 28 25   GLUCOSE 113* 119* 124* 183* 327*  BUN 10 14 15 16 17   CREATININE 1.37* 1.44* 1.40* 1.46* 1.42*  CALCIUM 8.6 8.7 8.5 8.8 8.6  MG 2.4 -- -- -- --  PHOS 2.9 -- -- -- --   Liver Function Tests: No results found for this basename: AST:5,ALT:5,ALKPHOS:5,BILITOT:5,PROT:5,ALBUMIN:5 in the last 168 hours No results found for this basename: LIPASE:5,AMYLASE:5 in the last 168 hours No results found for this basename: AMMONIA:5 in the last 168 hours CBC:  Lab 06/08/12 0523 06/06/12 1731 06/05/12  0530  WBC 5.4 5.7 5.3  NEUTROABS -- 3.8 --  HGB 9.6* 10.0* 10.2*  HCT 29.9* 30.0* 30.5*  MCV 89.3 87.7 87.1  PLT 189 194 184   Cardiac Enzymes: No results found for this basename:  CKTOTAL:5,CKMB:5,CKMBINDEX:5,TROPONINI:5 in the last 168 hours BNP: BNP (last 3 results) No results found for this basename: PROBNP:3 in the last 8760 hours CBG:  Lab 06/07/12 2135 06/07/12 1538 06/07/12 1057 06/07/12 0828 06/07/12 0649  GLUCAP 136* 133* 69* 76 105*       Signed:  Zionna Homewood, [id number]  [facility] 06/08/2012, 9:51 AM

## 2012-06-08 NOTE — Plan of Care (Signed)
Problem: Phase II Progression Outcomes Goal: Progress activity as tolerated unless otherwise ordered Outcome: Completed/Met Date Met:  06/08/12 Ambulated 100 feet with RW (2 assist)

## 2012-06-09 DIAGNOSIS — I498 Other specified cardiac arrhythmias: Secondary | ICD-10-CM

## 2012-06-09 LAB — CBC
HCT: 30 % — ABNORMAL LOW (ref 39.0–52.0)
Hemoglobin: 10 g/dL — ABNORMAL LOW (ref 13.0–17.0)
MCV: 88.5 fL (ref 78.0–100.0)
RDW: 12.4 % (ref 11.5–15.5)
WBC: 5.2 10*3/uL (ref 4.0–10.5)

## 2012-06-09 LAB — GLUCOSE, CAPILLARY
Glucose-Capillary: 131 mg/dL — ABNORMAL HIGH (ref 70–99)
Glucose-Capillary: 285 mg/dL — ABNORMAL HIGH (ref 70–99)
Glucose-Capillary: 274 mg/dL — ABNORMAL HIGH (ref 70–99)
Glucose-Capillary: 361 mg/dL — ABNORMAL HIGH (ref 70–99)
Glucose-Capillary: 186 mg/dL — ABNORMAL HIGH (ref 70–99)

## 2012-06-09 MED ORDER — INSULIN ASPART 100 UNIT/ML ~~LOC~~ SOLN
0.0000 [IU] | Freq: Three times a day (TID) | SUBCUTANEOUS | Status: DC
  Administered 2012-06-09: 20 [IU] via SUBCUTANEOUS
  Administered 2012-06-10: 4 [IU] via SUBCUTANEOUS

## 2012-06-09 MED ORDER — INSULIN ASPART 100 UNIT/ML ~~LOC~~ SOLN
3.0000 [IU] | Freq: Three times a day (TID) | SUBCUTANEOUS | Status: DC
  Administered 2012-06-09 – 2012-06-10 (×2): 3 [IU] via SUBCUTANEOUS

## 2012-06-09 MED ORDER — WARFARIN SODIUM 7.5 MG PO TABS
15.0000 mg | ORAL_TABLET | Freq: Once | ORAL | Status: AC
  Administered 2012-06-09: 15 mg via ORAL
  Filled 2012-06-09: qty 2

## 2012-06-09 MED ORDER — INSULIN GLARGINE 100 UNIT/ML ~~LOC~~ SOLN
24.0000 [IU] | Freq: Two times a day (BID) | SUBCUTANEOUS | Status: DC
  Administered 2012-06-09 – 2012-06-10 (×2): 24 [IU] via SUBCUTANEOUS

## 2012-06-09 MED ORDER — INSULIN GLARGINE 100 UNIT/ML ~~LOC~~ SOLN: 30.0000 [IU] | Freq: Every day | SUBCUTANEOUS | Status: DC

## 2012-06-09 MED ORDER — INSULIN ASPART 100 UNIT/ML ~~LOC~~ SOLN: 0.0000 [IU] | Freq: Every day | SUBCUTANEOUS | Status: DC

## 2012-06-09 NOTE — Clinical Social Work Note (Signed)
CSW spoke with Environmental education officer and ED at [facility] who report unable to accept pt until Monday. RN and MD updated. Weekday CSW to facilitate d/c Monday. FL2 complete and signed on chart.  Dellie Burns, MSW, LCSWA [phone] (Weekends 8:00am-4:30pm)

## 2012-06-09 NOTE — Progress Notes (Signed)
Patient expressed need to go to restroom.  Patient passed a lot of flatus and had very large bowel movement.  Patient is now comfortable and resting in bed.

## 2012-06-09 NOTE — Discharge Summary (Signed)
Physician Discharge Summary  Tyler [name]:[id number] DOB: 1933-10-13 DOA: 05/29/2012  PCP: [name], MD  Admit date: 05/29/2012 Discharge date: 06/09/2012  Time spent: 40 minutes  Recommendations for Outpatient Follow-up:  1. Recommend INR in 3 days + complete blood count and comprehensive metabolic panel-INR was subtherapeutic on discharge at 1.19 but should be relatively stable given he has a pacemaker in situ now 2. Recommend individualization of the patient's Lantus dosing-patient was slightly low on his blood sugars on the latter days of his admission and has only been discharged to the nursing home on Lantus 30. He previously was on Lantus 45 twice a day-please add a sliding scale to his inusulin as he increases his PO intake 3. Recommend close gastroenterology followup as an outpatient for Ogilvie's--this is a chronic issue and should not interfere with patient eating 4. [name] of Cardiology will arrange close pacemaker check and followup-keep scheduled appointment with [name] 07/19/2012 5. Patient's diuretics have been discontinued from prior admission-EF on this admission was 60%-no indication for Aldactone. Would individualize his diuretic dosing as an outpatient with Lasix 20 mg maybe every other day-this has not been ordered. Please get daily weights at the nursing facility. 6. Patient was treated with a full course of antibiotics for UTI   Discharge Diagnoses:  Principal Problem:  *Junctional rhythm Active Problems:  HYPERLIPIDEMIA  HYPERTENSION  ATRIAL FIBRILLATION, PAROXYSMAL  GERD  Bradycardia  CKD (chronic kidney disease) stage 3, GFR 30-59 ml/min  Hyperkalemia  UTI (lower urinary tract infection)  Constipation, chronic  Dehydration  Diabetes mellitus, type 2  Warfarin anticoagulation  Obesity  OSA (obstructive sleep apnea)-noncompliant with CPAP  Ogilvie's syndrome   Discharge Condition: stable  Diet recommendation: High-fiber low-residue  diabetic heart healthy diet  Filed Weights   06/02/12 0521 06/03/12 0500 06/09/12 0016  Weight: 121.5 kg (267 lb 13.7 oz) 122 kg (268 lb 15.4 oz) 122.925 kg (271 lb)    History of present illness:  Patient is a 78 year old man resident of an assisted living facility, history of chronic diastolic dysfunction history of paroxysmal atrial fibrillation on chronic Coumadin therapy, history of sick sinus syndrome, hypertension, diabetes who was transferred from the skilled nursing facility for evaluation of the above-noted complaints. Upon evaluation by EMS in the field, he was found to be bradycardic. ECG revealed a junctional rhythm. Patient claimed that he has been having intermittent dizziness, exertional dyspnea, headache, abdominal bloating intermittently for the past few months.  In the ED he was found to have a junctional rhythm. He also was found to have hyperkalemia. He was admitted to the step down unit for further monitoring and treatment.  On the morning of admission he had some left sided chest pain, that he cannot describe very well. There was no radiation of the pain. It was not clear whether this chest pain was associated with shortness of breath or nausea. He claimed that he could only ambulate around 10-15 feet before he gets short of breath, over the past few days he did claim that he gets lightheaded when he walks. He denied any fever, he denies any cough. He denies any vomiting. Denied any diarrhea.  He had a relatively uneventful hospital stay however then was noted to have abdominal distention and a GI consult was requested 06/04/2012 which was performed by [name]. He was restarted on Reglan in the hospital and kept on liquid diet and eventually pass stool 06/06/2012 which was diarrhea-like in nature with some formation but no  blood in it-gastroenterology saw him and ultimately stated after multiple serial x-rays and him passing small stools and flatus that this is a chronic issue but  this will need to be closely monitored. Please see below for rest of hospital visit and stay   Hospital Course:  Symptomatic Bradycardia: Junctional rhythm-known ATRIAL FIBRILLATION, PAROXYSMAL-Sick sinus syndrome *Management per cardiology/EP-has a junctional escape rhythm on monitor-->Discussed this 11/13 with [name] who reviewed the patient on the morning of 06/07/1999 and pacemaker placed 06/07/2012-patient will have close followup with cardiology per above and should not raise his left arm above his head until then *Continue to hold carvedilol-unsure which is any rate controlling agents patient will be able to tolerate in the future  *Daughter's report patient had similar problem back in 2010 and was evaluated by cardiologist in [place] who recommended a pacemaker- pt unfortunately was not able to follow up with him  *History of cardiac catheterization in 2003 without any evidence of coronary disease   Ogilvie's syndrome  *Multiple influencing factors: Diuretic use, beta blocker use, diabetes with likely gastroparesis, and decreased mobility  *KUB reveals dilated bowel and retained stool-Contrast enema was negative-patient passed a stool 06/06/2012 however he still appears distended  - no clear precipitating factor that we can reverse--encouraged to ambulate-rest per GI, appreciate input-note that GI has ordered a KUB-they have graduated his diet and have signed off 06/07/2012.  *Would recommend a follow up visit if this continues to be a concern. Would minimize opiates and other medications. *large stool early in the am of discharge 11.17  HYPERTENSION  On g, Doxazosin was tapered to 2 mg from 4 mg given as 1.7-10% risk of hypotension with this  Patient was treated with iv Hydralazine from 05/31/12 until 06/04/12   CKD (chronic kidney disease) stage 3, GFR 30-59 ml/min  *Likely has a degree of mild acute renal insufficiency due to dehydration and diminished perfusion related to  ongoing symptomatic bradycardia prior to admission  *Creatinine has remained stable at baseline ranges-start Lactated ringer 50 cc/hr-discontinued 11/15   Klebsiella UTI (lower urinary tract infection)/Leukocytosis  *Urine culture demonstrates ampicillin resistant Klebsiella  *pateint was started on Rocephin on 05/29/12 -06/04/12. Changed to po keflex to complete 06/06/12  *blood cultures ordered on 11/9--negative   Dehydration  *Patient was on 2 diuretics prior to admission- which we held from admission  *has been hydrated via IVF- continue for now until taking full po-fluids discontinued as per above  *Echocardiogram this admission reveals normal systolic function and parameters consistent with grade 2 diastolic dysfunction.  *Discussed with patient's family and they are unaware of any previous heart failure diagnosis or issues with chronic edema   Diabetes mellitus, type 2  - a1c 6.7 patient went hypoglycemic overnight and have discontinued all of his sliding scale insulin and cut his Lantus down from 44 twice a day to 30 units once daily. Blood sugars have been still borderline low with just Lantus see above Please add Sliding scale as you see fit in the NH  Grade 2 diastolic dysfunction with preserved EF-no rolecurrently for Aldactone given lack of mortality benefit in preserved EF Recommend individualized in his dose of diuretic-this has not been ordered. Recommend daily weights  HYPERLIPIDEMIA  *Continue statin   GERD  *Will discontinue Protonix in favor of H2 blocker since on antibiotics and wish to decrease risk of C. difficile colitis  *GERD appears to be secondary to bowel dysmotility so patient has been instructed on eating 5-6 small meals daily  Hyperkalemia  *Likely related to dehydration and the Aldactone the patient was taking prior to admission   Warfarin anticoagulation  *For paroxysmal atrial fibrillation  *Coumadin was placed on hold for possible pacer  placement-resumed 11/15-see above  Obesity/OSA (obstructive sleep apnea)-noncompliant with CPAP  *Bradycardia and sinus pauses occurred while patient was awake so doubtful sleep apnea contributing to the symptoms   Consultants:  Cardiology  Electrophysiology  GI   Procedures:   Pacemaker placed 06/07/2012, [employer] RF dual-chamber pacemaker for sick sinus syndrome   Antibiotics:  Rocephin 11/6 >>11/11 Keflex 11/12 >>11/13  Rocephin 11/6 >>>11/17   Discharge Exam: Filed Vitals:   06/08/12 1948 06/09/12 0016 06/09/12 0500 06/09/12 0727  BP: 132/65  123/55 134/80  Pulse: 61  64 62  Temp: 98.2 F (36.8 C)  98.3 F (36.8 C)   TempSrc: Oral  Oral   Resp: 18  18   Height:      Weight:  122.925 kg (271 lb)    SpO2: 92%  94%    Do well no new issues-tolerated most of breakfast.  No gas this morning Small stool yesterday Denies chest pain or shortness of breath   General: Pleasant no acute distress Cardiovascular: Clinically clear Respiratory: No tactile vocal resonance or fremitus Abdomen obese but less firm less distended  Discharge Instructions      Discharge Orders    Future Appointments: Provider: Department: Dept Phone: Center:   07/19/2012 3:15 PM [name], MD [facility] Main Office [place]) [phone] [id number]     Future Orders Please Complete By Expires   Diet - low sodium heart healthy      Increase activity slowly      Discharge instructions      Comments:   Do not raise Left arm above head until seen by EP doctor-[name] will ensure follow up for pacer check is made sooner   Lifting restrictions      Comments:   See above   Call MD for:  persistant nausea and vomiting      Call MD for:  redness, tenderness, or signs of infection (pain, swelling, redness, odor or green/yellow discharge around incision site)      Call MD for:  hives      ACE Inhibitor / ARB already ordered          Medication List     As of  06/09/2012  8:13 AM    STOP taking these medications         carvedilol 6.25 MG tablet   Commonly known as: COREG      dextrose 40 % Gel   Commonly known as: GLUTOSE      insulin aspart 100 UNIT/ML injection   Commonly known as: novoLOG      INSULIN SYRINGE 1CC/29G 29G X 1/2" 1 ML Misc      polyethylene glycol powder powder   Commonly known as: GLYCOLAX/MIRALAX      simethicone 80 MG chewable tablet   Commonly known as: MYLICON      spironolactone 25 MG tablet   Commonly known as: ALDACTONE      TAKE these medications         acetaminophen 500 MG tablet   Commonly known as: TYLENOL   Take 1,000 mg by mouth every 6 (six) hours as needed. For severe headache      BEPREVE 1.5 % Soln   Generic drug: Bepotastine Besilate   Place 1 drop into both eyes  2 (two) times daily.      cycloSPORINE 0.05 % ophthalmic emulsion   Commonly known as: RESTASIS   Place 1 drop into both eyes 2 (two) times daily.      donepezil 10 MG tablet   Commonly known as: ARICEPT   Take 10 mg by mouth at bedtime.      doxazosin 4 MG tablet   Commonly known as: CARDURA   Take 0.5 tablets (2 mg total) by mouth at bedtime.      famotidine 40 MG tablet   Commonly known as: PEPCID   Take 40 mg by mouth at bedtime.      ferrous sulfate 325 (65 FE) MG tablet   Take 325 mg by mouth daily with breakfast.      fexofenadine 180 MG tablet   Commonly known as: ALLEGRA   Take 180 mg by mouth daily as needed. For seasonal allergies      Fluticasone-Salmeterol 100-50 MCG/DOSE Aepb   Commonly known as: ADVAIR   Inhale 1 puff into the lungs 2 (two) times daily. Rinse mouth and spit after each use      hydroxypropyl methylcellulose 2.5 % ophthalmic solution   Commonly known as: ISOPTO TEARS   Place 1 drop into both eyes every 2 (two) hours as needed. For dry eyes      insulin glargine 100 UNIT/ML injection   Commonly known as: LANTUS   Inject 20 Units into the skin at bedtime.      ipratropium 0.06 %  nasal spray   Commonly known as: ATROVENT   Place 2 sprays into the nose 3 (three) times daily.      metFORMIN 750 MG 24 hr tablet   Commonly known as: GLUCOPHAGE-XR   Take 750 mg by mouth daily with breakfast.      metoCLOPramide 10 MG tablet   Commonly known as: REGLAN   Take 0.5 tablets (5 mg total) by mouth at bedtime.      multivitamin with minerals Tabs   Take 1 tablet by mouth every morning.      neomycin-bacitracin-polymyxin Oint   Commonly known as: NEOSPORIN   Apply 1 application topically daily. To left big toe      pantoprazole 40 MG tablet   Commonly known as: PROTONIX   Take 40 mg by mouth 2 (two) times daily.      sennosides-docusate sodium 8.6-50 MG tablet   Commonly known as: SENOKOT-S   Take 2 tablets by mouth at bedtime as needed. For constipation      sertraline 25 MG tablet   Commonly known as: ZOLOFT   Take 25 mg by mouth daily.      simvastatin 20 MG tablet   Commonly known as: ZOCOR   Take 20 mg by mouth at bedtime.      sodium chloride 0.65 % Soln nasal spray   Commonly known as: OCEAN   Place 1 spray into the nose 4 (four) times daily.      torsemide 100 MG tablet   Commonly known as: DEMADEX   Take 50 mg by mouth daily.      trolamine salicylate 10 % cream   Commonly known as: ASPERCREME   Apply 1 application topically 3 (three) times daily. To left shoulder      VENTOLIN HFA 108 (90 BASE) MCG/ACT inhaler   Generic drug: albuterol   Inhale 2 puffs into the lungs every 4 (four) hours as needed. For shortness of breath  Vitamin D (Ergocalciferol) 50000 UNITS Caps   Commonly known as: DRISDOL   Take 50,000 Units by mouth every 7 (seven) days. Fridays      warfarin 5 MG tablet   Commonly known as: COUMADIN   Take 7.5-10 mg by mouth daily. Take 2 tablets (=10mg ) on Monday, Wednesday, and Friday.  Take 1.5 tablet (=7.5mg ) on Tuesday, Thursday, Saturday, and sunday         Follow-up Information    Follow up with [name], MD.  On 07/19/2012. (At 3:15 PM)    Contact information:   [address] [place] [address] [phone]      Follow up with [name], MD. In 1 month.   Contact information:   [address] [place] [address] [phone]       Follow up with [facility] In 10 days. (office will call)    Contact information:   [address] [place] [address] [phone]      Follow up with [name], MD. In 3 months. (office will call (December appointment may end up being canceled))    Contact information:   [address] [place] [address] [phone]           The results of significant diagnostics from this hospitalization (including imaging, microbiology, ancillary and laboratory) are listed below for reference.    Significant Diagnostic Studies: Dg Chest 2 View  06/08/2012  *RADIOLOGY REPORT*  Clinical Data: Pacemaker implantation.  CHEST - 2 VIEW  Comparison: 05/29/2012  Findings: Two views of the chest demonstrate placement of a left- sided cardiac pacemaker.  There is a lead in the region of the right ventricle and the other is near the cavoatrial junction.  Low lung volumes and slightly prominent interstitial lung markings.  No evidence for a pneumothorax.  The lateral view raises concern for a small right pleural effusion.  Difficult to exclude a small left pleural effusion.  IMPRESSION: Placement of left cardiac pacemaker without a pneumothorax.  Possible small pleural effusions.   Original Report Authenticated By: Richarda Overlie, M.D.    Dg Abd 1 View  06/07/2012  *RADIOLOGY REPORT*  Clinical Data: Abdominal pain, distention.  ABDOMEN - 1 VIEW  Comparison: 06/05/2012  Findings: Severe diffuse gaseous distention of the colon again noted.  Transverse colon again measures approximately 12 cm in diameter.  Rectosigmoid colon is less distended, but no definite evidence for an distal obstruction.  There is  oral contrast material within the rectosigmoid colon.  No free air.  No organomegaly.  IMPRESSION: Severe gaseous distention of the colon, likely severe ileus.   Original Report Authenticated By: Charlett Nose, M.D.    Dg Chest Port 1 View  05/29/2012  *RADIOLOGY REPORT*  Clinical Data: Bradycardia.  Diabetes.  Nausea.  PORTABLE CHEST - 1 VIEW  Comparison: 10/07/2010.  Findings: Low volume chest.  Apical lordotic projection. Defibrillator pads project over the chest.  Monitoring leads are also present on the chest.  Allowing for the low volumes of inspiration, the heart lungs appear within normal limits.  IMPRESSION: Low volume chest.   Original Report Authenticated By: Andreas Newport, M.D.    Dg Abd Portable 1v  06/05/2012  *RADIOLOGY REPORT*  Clinical Data: Follow up ileus.  PORTABLE ABDOMEN - 1 VIEW  Comparison: 06/03/2012  Findings: Portable supine view of the abdomen again demonstrates marked gaseous distention of the colon.  There appears to be a  large distended bowel loop in the left upper abdomen, measuring up to 12 cm.  This could be associated with transverse colon or sigmoid colon.  The pelvic region was not imaged.  IMPRESSION: Concern for increased gaseous distention of the colon.  Findings may represent worsening ileus but a distal colonic obstruction cannot be excluded, particularly since the pelvic region is not imaged on this examination.  If there is clinical concern for colonic obstruction, suggest a contrast enema for further evaluation.   Original Report Authenticated By: Richarda Overlie, M.D.    Dg Abd Portable 1v  06/03/2012  *RADIOLOGY REPORT*  Clinical Data: Colonic distention  PORTABLE ABDOMEN - 1 VIEW  Comparison: 06/02/2012  Findings: Gaseous colonic distention persists without substantial interval change.  There is some mild gaseous small bowel distention associated as well.  Rectal tube overlies the lower midline pelvis. Visualized bony structures are unremarkable.  IMPRESSION: No  substantial change in gaseous distention of the colon.   Original Report Authenticated By: Kennith Center, M.D.    Dg Abd Portable 1v  06/02/2012  *RADIOLOGY REPORT*  Clinical Data: Constipation/abdominal distension.  PORTABLE ABDOMEN - 1 VIEW  Comparison: 05/30/2012  Findings: There are multiple air-filled loops of large and small bowel with continued prominence of a colonic loop in the left abdomen.  No definite free air or air-fluid levels.  Remainder of the exam is unchanged.  IMPRESSION: Continued air filled loops of large and small bowel with mild prominence of a colonic loop in the left abdomen.  Findings are likely due to ileus.  Recommend follow up as clinically indicated.   Original Report Authenticated By: Elberta Fortis, M.D.    Dg Abd Portable 1v  05/30/2012  *RADIOLOGY REPORT*  Clinical Data: History of abdominal distention.  History of small amount of bowel movements the last couple of days.  Nausea. History of constipation.  History of urinary tract infections.  PORTABLE ABDOMEN - 1 VIEW  Comparison: 05/29/2012.  Findings: There are multiple gas-filled loops of bowel which appear very dilated.  These appear to be colonic.  There is some gas within the rectum.  The rectum is not dilated.  No significant small-bowel dilatation cannot be appreciated.  There is mild gaseous distention of the mid and distal portion of the stomach. Fecal burden is small.  No opaque calculi are seen.  There is degenerative spondylosis.  IMPRESSION: Multiple gas-filled loops of intestine which appear dilated and appear to be colonic.  There is gas within the nondilated rectum. The most distended loop is in the lower left abdomen.  It was distended on the previous study to a caliber of 8.6 cm.  On the current study it is distended up to caliber of 10 cm.  No colonic wall edema or pneumatosis is evident. Fecal burden appears small. Colonic partial obstruction cannot be excluded.   Original Report Authenticated By: Onalee Hua  Call    Dg Abd Portable 2v  05/29/2012  *RADIOLOGY REPORT*  Clinical Data: Chest pain.  Abdominal pain.  PORTABLE ABDOMEN - 2 VIEW  Comparison: 03/19/2008 CT.  Findings: Nonobstructive bowel gas pattern is present.  There is gaseous distention of colon in the left mid abdomen, likely within redundant sigmoid.  No gross plain film evidence of free air.  Prominent stool is present in the right colon.  Monitoring leads project over the lower chest along with a defibrillator pad.  Gas is present in the anatomic pelvis however the rectum is not visualized on these two views.  IMPRESSION:  Gaseous distention of the colon.  Overall bowel gas pattern is nonobstructive.   Original Report Authenticated By: Andreas Newport, M.D.    Dg Colon W/water Sol Cm  06/05/2012  *RADIOLOGY REPORT*  Clinical Data: Distended abdomen with diffusely dilated colon. Question colonic obstruction.  WATER SOLUBLE CONTRAST ENEMA  Technique:  Initial scout AP supine abdominal image was not repeated, having been performed earlier today.  Water soluble contrast was introduced into the colon in a retrograde fashion and refluxed from the rectum to the cecum.  Spot images of the colon followed by overhead radiographs were obtained.  Fluoroscopy time: 1.37 minutes.  Comparison:  Abdominal radiographs 06/05/2012 and 06/03/2012. Abdominal pelvic CT 03/19/2008.  Findings:  The colon is diffusely distended, especially the sigmoid colon which is moderately distended and elongated.  Contrast refluxes freely through the sigmoid colon into the descending colon.  By turning the patient, I was able to maneuver the contrast into the transverse colon, and subsequently into the right colon. There is no evidence of colonic obstruction or perforation.  No focal mucosal lesions are identified.  Mucosal assessment is limited by the patient's size and water soluble technique.  A post drainage/post evacuation view shows mild improvement in the diffuse colonic  distension.  IMPRESSION: No evidence of colonic obstruction, focal mucosal lesion or volvulus.  The colon is diffusely dilated and elongated.   Original Report Authenticated By: Carey Bullocks, M.D.     Microbiology: Recent Results (from the past 240 hour(s))  CULTURE, BLOOD (ROUTINE X 2)     Status: Normal   Collection Time   05/31/12 12:16 PM      Component Value Range Status Comment   Specimen Description BLOOD RIGHT THUMB   Final    Special Requests BOTTLES DRAWN AEROBIC ONLY 5.5CC   Final    Culture  Setup Time 05/31/2012 18:57   Final    Culture NO GROWTH 5 DAYS   Final    Report Status 06/06/2012 FINAL   Final   CULTURE, BLOOD (ROUTINE X 2)     Status: Normal   Collection Time   05/31/12 12:27 PM      Component Value Range Status Comment   Specimen Description BLOOD LEFT HAND   Final    Special Requests BOTTLES DRAWN AEROBIC ONLY [facility]   Final    Culture  Setup Time 05/31/2012 18:57   Final    Culture NO GROWTH 5 DAYS   Final    Report Status 06/06/2012 FINAL   Final      Labs: Basic Metabolic Panel:  Lab 06/07/12 1610 06/06/12 0440 06/05/12 0530 06/04/12 0842 06/03/12 0500  NA 134* 134* 137 136 136  K 4.0 4.4 4.1 4.3 4.5  CL 102 100 102 101 101  CO2 27 27 26 28 25   GLUCOSE 113* 119* 124* 183* 327*  BUN 10 14 15 16 17   CREATININE 1.37* 1.44* 1.40* 1.46* 1.42*  CALCIUM 8.6 8.7 8.5 8.8 8.6  MG 2.4 -- -- -- --  PHOS 2.9 -- -- -- --   Liver Function Tests: No results found for this basename: AST:5,ALT:5,ALKPHOS:5,BILITOT:5,PROT:5,ALBUMIN:5 in the last 168 hours No results found for this basename: LIPASE:5,AMYLASE:5 in the last 168 hours No results found for this basename: AMMONIA:5 in the last 168 hours CBC:  Lab 06/09/12 0728 06/08/12 0523 06/06/12 1731 06/05/12 0530  WBC 5.2 5.4 5.7 5.3  NEUTROABS -- -- 3.8 --  HGB 10.0* 9.6* 10.0* 10.2*  HCT 30.0* 29.9* 30.0* 30.5*  MCV 88.5  89.3 87.7 87.1  PLT 179 189 194 184   Cardiac Enzymes: No results found for this  basename: CKTOTAL:5,CKMB:5,CKMBINDEX:5,TROPONINI:5 in the last 168 hours BNP: BNP (last 3 results) No results found for this basename: PROBNP:3 in the last 8760 hours CBG:  Lab 06/09/12 0616 06/08/12 2053 06/08/12 1629 06/08/12 1117 06/08/12 0642  GLUCAP 274* 285* 308* 263* 131*       Signed:  Rhetta Mura  [facility] 06/09/2012, 8:13 AM

## 2012-06-09 NOTE — Progress Notes (Signed)
06/09/2012 11:59 AM Nursing note Pt. And daughter viewed educational video [id number] on life with a pacemaker. Pt. And daughter also received pacemaker educational booklet. Questions and concerns addressed.  Cartez Mogle, Blanchard Kelch

## 2012-06-10 ENCOUNTER — Encounter: Payer: Self-pay | Admitting: *Deleted

## 2012-06-10 DIAGNOSIS — I498 Other specified cardiac arrhythmias: Secondary | ICD-10-CM

## 2012-06-10 DIAGNOSIS — Z950 Presence of cardiac pacemaker: Secondary | ICD-10-CM | POA: Insufficient documentation

## 2012-06-10 LAB — CBC
HCT: 29.7 % — ABNORMAL LOW (ref 39.0–52.0)
Hemoglobin: 10.1 g/dL — ABNORMAL LOW (ref 13.0–17.0)
MCH: 30.1 pg (ref 26.0–34.0)
MCV: 88.4 fL (ref 78.0–100.0)
RBC: 3.36 MIL/uL — ABNORMAL LOW (ref 4.22–5.81)

## 2012-06-10 MED ORDER — INSULIN GLARGINE 100 UNIT/ML ~~LOC~~ SOLN: 24.0000 [IU] | Freq: Two times a day (BID) | SUBCUTANEOUS | Status: DC

## 2012-06-10 MED ORDER — DOXAZOSIN MESYLATE 2 MG PO TABS: 2.0000 mg | ORAL_TABLET | Freq: Every day | ORAL | Status: DC

## 2012-06-10 NOTE — Progress Notes (Signed)
Occupational Therapy Treatment Patient Details Name: Tyler [name] MRN: [id number] DOB: 1933-12-24 Today's Date: 06/10/2012 Time: 4010-2725 OT Time Calculation (min): 23 min               Precautions / Restrictions Precautions Precaution Comments: Pt has sling on left arm s/p pacer placement Restrictions Weight Bearing Restrictions: Yes Other Position/Activity Restrictions: NWB LUe s/p pacer placement       ADL  Transfers/Ambulation Related to ADLs: Pt did agree to practice sit to stand to prepare for increased I with ADL activity from the chair. Pt did have on sling on LUe. Pt had a hard time getting up not using LUE. Communicate this with RN who stated she would find out when pt could use LUE ADL Comments: Pt educated on safety with sit to stand activity and not using LUe to pusf up from chair at this time s/p pacemaker placement      OT Goals ADL Goals ADL Goal: Toilet Transfer - Progress: Progressing toward goals (slow progress due to limiting use of LUE) Miscellaneous OT Goals Miscellaneous OT Goal #1: Pt willl limit use of LUE with ADL activity s/p pacemaker placement with min verbal cues OT Goal: Miscellaneous Goal #1 - Progress: Discontinued (comment) (due to pacemaker placement) Miscellaneous OT Goal #2: Pt willl limit use of LUE with ADL activity s/p pacemaker placement with min verbal cues  Visit Information  Last OT Received On: 06/10/12    Subjective Data  Subjective: i want to get back to [facility]      Cognition  Overall Cognitive Status: Appears within functional limits for tasks assessed/performed Arousal/Alertness: Other (comment) (flat affect) Orientation Level: Appears intact for tasks assessed Behavior During Session: Flat affect    Mobility  Shoulder Instructions Transfers Transfers: Sit to Stand;Stand to Sit Sit to Stand: 3: Mod assist;Other (comment) (using R arm only) Stand to Sit: 3: Mod assist;Other (comment) (using r arm  only) Details for Transfer Assistance: Pt needed verbal cues to limit use of LUE as LUE in sling s/p pacemaker placement             End of Session  Pt left in chair with call bell and phone with in pts reach. Pt educated to call for RN assist to get up  G    Izabella Marcantel, [place] 06/10/2012, 10:20 AM

## 2012-06-10 NOTE — Plan of Care (Signed)
Problem: Phase III Progression Outcomes Goal: Voiding independently Outcome: Completed/Met Date Met:  06/10/12 Via condom cath

## 2012-06-10 NOTE — Discharge Summary (Signed)
Physician Discharge Summary  ZAIAH ECKERSON ZOX:[id number] DOB: Mar 28, 1934 DOA: 05/29/2012  PCP: [name], MD  Admit date: 05/29/2012 Discharge date: 06/10/2012  Time spent: 40 minutes  Recommendations for Outpatient Follow-up:  1. Recommend INR in 3 days + complete blood count and comprehensive metabolic panel-INR was subtherapeutic on discharge at 1.19 but should be relatively stable given he has a pacemaker in situ now 2. Recommend individualization of the patient's Lantus dosing-discharged to the nursing home on Lantus 24 bid. He previously was on Lantus 45 twice a day-please add a sliding scale to his inusulin as he increases his PO intake 3. Recommend close gastroenterology followup as an outpatient for Ogilvie's--this is a chronic issue and should not interfere with patient eating 4. [name] of Cardiology will arrange close pacemaker check and followup-keep scheduled appointment with [name] 07/19/2012 5. Patient's diuretics have been discontinued from prior admission-EF on this admission was 60%-no indication for Aldactone. Would individualize his diuretic dosing as an outpatient with Lasix 20 mg maybe every other day-this has not been ordered. Please get daily weights at the nursing facility. 6. Patient was treated with a full course of antibiotics for UTI   Discharge Diagnoses:  Principal Problem:  *Junctional rhythm Active Problems:  HYPERLIPIDEMIA  HYPERTENSION  ATRIAL FIBRILLATION, PAROXYSMAL  GERD  Bradycardia  CKD (chronic kidney disease) stage 3, GFR 30-59 ml/min  Hyperkalemia  UTI (lower urinary tract infection)  Constipation, chronic  Dehydration  Diabetes mellitus, type 2  Warfarin anticoagulation  Obesity  OSA (obstructive sleep apnea)-noncompliant with CPAP  Ogilvie's syndrome   Discharge Condition: stable  Diet recommendation: High-fiber low-residue diabetic heart healthy diet  Filed Weights   06/09/12 0016 06/10/12 0419 06/10/12 0500    Weight: 122.925 kg (271 lb) 121.7 kg (268 lb 4.8 oz) 121.7 kg (268 lb 4.8 oz)    History of present illness:  Patient is a 78 year old man resident of an assisted living facility, history of chronic diastolic dysfunction history of paroxysmal atrial fibrillation on chronic Coumadin therapy, history of sick sinus syndrome, hypertension, diabetes who was transferred from the skilled nursing facility for evaluation of the above-noted complaints. Upon evaluation by EMS in the field, he was found to be bradycardic. ECG revealed a junctional rhythm. Patient claimed that he has been having intermittent dizziness, exertional dyspnea, headache, abdominal bloating intermittently for the past few months.  In the ED he was found to have a junctional rhythm. He also was found to have hyperkalemia. He was admitted to the step down unit for further monitoring and treatment.  On the morning of admission he had some left sided chest pain, that he cannot describe very well. There was no radiation of the pain. It was not clear whether this chest pain was associated with shortness of breath or nausea. He claimed that he could only ambulate around 10-15 feet before he gets short of breath, over the past few days he did claim that he gets lightheaded when he walks. He denied any fever, he denies any cough. He denies any vomiting. Denied any diarrhea.  He had a relatively uneventful hospital stay however then was noted to have abdominal distention and a GI consult was requested 06/04/2012 which was performed by [name]. He was restarted on Reglan in the hospital and kept on liquid diet and eventually pass stool 06/06/2012 which was diarrhea-like in nature with some formation but no blood in it-gastroenterology saw him and ultimately stated after multiple serial x-rays and him passing small stools  and flatus that this is a chronic issue but this will need to be closely monitored. Please see below for rest of hospital visit and  stay   Hospital Course:  Symptomatic Bradycardia: Junctional rhythm-known ATRIAL FIBRILLATION, PAROXYSMAL-Sick sinus syndrome *Management per cardiology/EP-has a junctional escape rhythm on monitor-->Discussed this 11/13 with [name] who reviewed the patient on the morning of 06/07/1999 and pacemaker placed 06/07/2012-patient will have close followup with cardiology per above and should not raise his left arm above his head until then *Continue to hold carvedilol-unsure which is any rate controlling agents patient will be able to tolerate in the future  *Daughter's report patient had similar problem back in 2010 and was evaluated by cardiologist in [place] who recommended a pacemaker- pt unfortunately was not able to follow up with him  *History of cardiac catheterization in 2003 without any evidence of coronary disease   Ogilvie's syndrome  *Multiple influencing factors: Diuretic use, beta blocker use, diabetes with likely gastroparesis, and decreased mobility  *KUB reveals dilated bowel and retained stool-Contrast enema was negative-patient passed a stool 06/06/2012 however he still appears distended  - no clear precipitating factor that we can reverse--encouraged to ambulate-rest per GI, appreciate input-note that GI has ordered a KUB-they have graduated his diet and have signed off 06/07/2012.  *Would recommend a follow up visit if this continues to be a concern. Would minimize opiates and other medications. *large stool early in the am of discharge 11.17  HYPERTENSION  On g, Doxazosin was tapered to 2 mg from 4 mg given as 1.7-10% risk of hypotension with this  Patient was treated with iv Hydralazine from 05/31/12 until 06/04/12   CKD (chronic kidney disease) stage 3, GFR 30-59 ml/min  *Likely has a degree of mild acute renal insufficiency due to dehydration and diminished perfusion related to ongoing symptomatic bradycardia prior to admission  *Creatinine has remained stable at  baseline ranges-start Lactated ringer 50 cc/hr-discontinued 11/15   Klebsiella UTI (lower urinary tract infection)/Leukocytosis  *Urine culture demonstrates ampicillin resistant Klebsiella  *pateint was started on Rocephin on 05/29/12 -06/04/12. Changed to po keflex to complete 06/06/12  *blood cultures ordered on 11/9--negative   Dehydration  *Patient was on 2 diuretics prior to admission- which we held from admission  *has been hydrated via IVF- continue for now until taking full po-fluids discontinued as per above  *Echocardiogram this admission reveals normal systolic function and parameters consistent with grade 2 diastolic dysfunction.  *Discussed with patient's family and they are unaware of any previous heart failure diagnosis or issues with chronic edema   Diabetes mellitus, type 2  - a1c 6.7 patient went hypoglycemic overnight and have discontinued all of his sliding scale insulin and cut his Lantus down from 44 twice a day to 30 units once daily. Blood sugars have been still borderline low with just Lantus see above Please add Sliding scale as you see fit in the NH  Grade 2 diastolic dysfunction with preserved EF-no rolecurrently for Aldactone given lack of mortality benefit in preserved EF Recommend individualized in his dose of diuretic-this has not been ordered. Recommend daily weights  HYPERLIPIDEMIA  *Continue statin   GERD  *Will discontinue Protonix in favor of H2 blocker since on antibiotics and wish to decrease risk of C. difficile colitis  *GERD appears to be secondary to bowel dysmotility so patient has been instructed on eating 5-6 small meals daily   Hyperkalemia  *Likely related to dehydration and the Aldactone the patient was taking prior to  admission   Warfarin anticoagulation  *For paroxysmal atrial fibrillation  *Coumadin was placed on hold for possible pacer placement-resumed 11/15-see above  Obesity/OSA (obstructive sleep apnea)-noncompliant with CPAP    *Bradycardia and sinus pauses occurred while patient was awake so doubtful sleep apnea contributing to the symptoms   Consultants:  Cardiology  Electrophysiology  GI   Procedures:   Pacemaker placed 06/07/2012, [employer] RF dual-chamber pacemaker for sick sinus syndrome   Antibiotics:  Rocephin 11/6 >>11/11 Keflex 11/12 >>11/13  Rocephin 11/6 >>>11/17   Discharge Exam: Filed Vitals:   06/09/12 2019 06/10/12 0419 06/10/12 0500 06/10/12 0841  BP: 132/70 157/68    Pulse: 60 68    Temp: 98.9 F (37.2 C) 98.4 F (36.9 C)    TempSrc: Oral Oral    Resp: 17 18    Height:      Weight:  121.7 kg (268 lb 4.8 oz) 121.7 kg (268 lb 4.8 oz)   SpO2: 96% 98%  97%   Do well no new issues-tolerated most of breakfast.  No gas this morning Small stool yesterday Denies chest pain or shortness of breath   General: Pleasant no acute distress Cardiovascular: Clinically clear Respiratory: No tactile vocal resonance or fremitus Abdomen obese but less firm less distended  Discharge Instructions      Discharge Orders    Future Appointments: Provider: Department: Dept Phone: Center:   07/19/2012 3:15 PM [name], MD [facility] Main Office [place]) [phone] [id number]     Future Orders Please Complete By Expires   Diet - low sodium heart healthy      Diet - low sodium heart healthy      Diet - low sodium heart healthy      Increase activity slowly      Discharge instructions      Comments:   Do not raise Left arm above head until seen by EP doctor-[name] will ensure follow up for pacer check is made sooner   Lifting restrictions      Comments:   See above   Call MD for:  persistant nausea and vomiting      Call MD for:  redness, tenderness, or signs of infection (pain, swelling, redness, odor or green/yellow discharge around incision site)      Call MD for:  hives      Increase activity slowly      Increase activity slowly      Call MD for:   temperature >100.4      Call MD for:  severe uncontrolled pain      Call MD for:  redness, tenderness, or signs of infection (pain, swelling, redness, odor or green/yellow discharge around incision site)      Call MD for:  hives      (HEART FAILURE PATIENTS) Call MD:  Anytime you have any of the following symptoms: 1) 3 pound weight gain in 24 hours or 5 pounds in 1 week 2) shortness of breath, with or without a dry hacking cough 3) swelling in the hands, feet or stomach 4) if you have to sleep on extra pillows at night in order to breathe.      ACE Inhibitor / ARB already ordered          Medication List     As of 06/10/2012  8:55 AM    STOP taking these medications         carvedilol 6.25 MG tablet   Commonly known as: COREG  dextrose 40 % Gel   Commonly known as: GLUTOSE      insulin aspart 100 UNIT/ML injection   Commonly known as: novoLOG      INSULIN SYRINGE 1CC/29G 29G X 1/2" 1 ML Misc      polyethylene glycol powder powder   Commonly known as: GLYCOLAX/MIRALAX      simethicone 80 MG chewable tablet   Commonly known as: MYLICON      spironolactone 25 MG tablet   Commonly known as: ALDACTONE      TAKE these medications         acetaminophen 500 MG tablet   Commonly known as: TYLENOL   Take 1,000 mg by mouth every 6 (six) hours as needed. For severe headache      BEPREVE 1.5 % Soln   Generic drug: Bepotastine Besilate   Place 1 drop into both eyes 2 (two) times daily.      cycloSPORINE 0.05 % ophthalmic emulsion   Commonly known as: RESTASIS   Place 1 drop into both eyes 2 (two) times daily.      donepezil 10 MG tablet   Commonly known as: ARICEPT   Take 10 mg by mouth at bedtime.      doxazosin 4 MG tablet   Commonly known as: CARDURA   Take 0.5 tablets (2 mg total) by mouth at bedtime.      famotidine 40 MG tablet   Commonly known as: PEPCID   Take 40 mg by mouth at bedtime.      ferrous sulfate 325 (65 FE) MG tablet   Take 325 mg by mouth  daily with breakfast.      fexofenadine 180 MG tablet   Commonly known as: ALLEGRA   Take 180 mg by mouth daily as needed. For seasonal allergies      Fluticasone-Salmeterol 100-50 MCG/DOSE Aepb   Commonly known as: ADVAIR   Inhale 1 puff into the lungs 2 (two) times daily. Rinse mouth and spit after each use      hydroxypropyl methylcellulose 2.5 % ophthalmic solution   Commonly known as: ISOPTO TEARS   Place 1 drop into both eyes every 2 (two) hours as needed. For dry eyes      insulin glargine 100 UNIT/ML injection   Commonly known as: LANTUS   Inject 24 Units into the skin 2 (two) times daily.      ipratropium 0.06 % nasal spray   Commonly known as: ATROVENT   Place 2 sprays into the nose 3 (three) times daily.      metFORMIN 750 MG 24 hr tablet   Commonly known as: GLUCOPHAGE-XR   Take 750 mg by mouth daily with breakfast.      metoCLOPramide 10 MG tablet   Commonly known as: REGLAN   Take 0.5 tablets (5 mg total) by mouth at bedtime.      multivitamin with minerals Tabs   Take 1 tablet by mouth every morning.      neomycin-bacitracin-polymyxin Oint   Commonly known as: NEOSPORIN   Apply 1 application topically daily. To left big toe      pantoprazole 40 MG tablet   Commonly known as: PROTONIX   Take 40 mg by mouth 2 (two) times daily.      sennosides-docusate sodium 8.6-50 MG tablet   Commonly known as: SENOKOT-S   Take 2 tablets by mouth at bedtime as needed. For constipation      sertraline 25 MG tablet   Commonly known as: ZOLOFT  Take 25 mg by mouth daily.      simvastatin 20 MG tablet   Commonly known as: ZOCOR   Take 20 mg by mouth at bedtime.      sodium chloride 0.65 % Soln nasal spray   Commonly known as: OCEAN   Place 1 spray into the nose 4 (four) times daily.      torsemide 100 MG tablet   Commonly known as: DEMADEX   Take 50 mg by mouth daily.      trolamine salicylate 10 % cream   Commonly known as: ASPERCREME   Apply 1 application  topically 3 (three) times daily. To left shoulder      VENTOLIN HFA 108 (90 BASE) MCG/ACT inhaler   Generic drug: albuterol   Inhale 2 puffs into the lungs every 4 (four) hours as needed. For shortness of breath      Vitamin D (Ergocalciferol) 50000 UNITS Caps   Commonly known as: DRISDOL   Take 50,000 Units by mouth every 7 (seven) days. Fridays      warfarin 5 MG tablet   Commonly known as: COUMADIN   Take 7.5-10 mg by mouth daily. Take 2 tablets (=10mg ) on Monday, Wednesday, and Friday.  Take 1.5 tablet (=7.5mg ) on Tuesday, Thursday, Saturday, and sunday         Follow-up Information    Follow up with [name], MD. On 07/19/2012. (At 3:15 PM)    Contact information:   [address] [place] [address] [phone]      Follow up with [name], MD. In 1 month.   Contact information:   [address] [place] [address] [phone]       Follow up with [facility] In 10 days. (office will call)    Contact information:   [address] [place] [address] [phone]      Follow up with [name], MD. In 3 months. (office will call (December appointment may end up being canceled))    Contact information:   [address] [place] [address] [phone]           The results of significant diagnostics from this hospitalization (including imaging, microbiology, ancillary and laboratory) are listed below for reference.    Significant Diagnostic Studies: Dg Chest 2 View  06/08/2012  *RADIOLOGY REPORT*  Clinical Data: Pacemaker implantation.  CHEST - 2 VIEW  Comparison: 05/29/2012  Findings: Two views of the chest demonstrate placement of a left- sided cardiac pacemaker.  There is a lead in the region of the right ventricle and the other is near the cavoatrial junction.  Low lung volumes and slightly prominent interstitial lung markings.  No evidence for a pneumothorax.  The  lateral view raises concern for a small right pleural effusion.  Difficult to exclude a small left pleural effusion.  IMPRESSION: Placement of left cardiac pacemaker without a pneumothorax.  Possible small pleural effusions.   Original Report Authenticated By: Richarda Overlie, M.D.    Dg Abd 1 View  06/07/2012  *RADIOLOGY REPORT*  Clinical Data: Abdominal pain, distention.  ABDOMEN - 1 VIEW  Comparison: 06/05/2012  Findings: Severe diffuse gaseous distention of the colon again noted.  Transverse colon again measures approximately 12 cm in diameter.  Rectosigmoid colon is less distended, but no definite evidence for an distal obstruction.  There is oral contrast material within the rectosigmoid colon.  No free air.  No organomegaly.  IMPRESSION: Severe gaseous distention of the colon, likely severe ileus.   Original Report Authenticated By: Charlett Nose, M.D.    Dg Chest Port 1 View  05/29/2012  *RADIOLOGY REPORT*  Clinical Data: Bradycardia.  Diabetes.  Nausea.  PORTABLE CHEST - 1 VIEW  Comparison: 10/07/2010.  Findings: Low volume chest.  Apical lordotic projection. Defibrillator pads project over the chest.  Monitoring leads are also present on the chest.  Allowing for the low volumes of inspiration, the heart lungs appear within normal limits.  IMPRESSION: Low volume chest.   Original Report Authenticated By: Andreas Newport, M.D.    Dg Abd Portable 1v  06/05/2012  *RADIOLOGY REPORT*  Clinical Data: Follow up ileus.  PORTABLE ABDOMEN - 1 VIEW  Comparison: 06/03/2012  Findings: Portable supine view of the abdomen again demonstrates marked gaseous distention of the colon.  There appears to be a large distended bowel loop in the left upper abdomen, measuring up to 12 cm.  This could be associated with transverse colon or sigmoid colon.  The pelvic region was not imaged.  IMPRESSION: Concern for increased gaseous distention of the colon.  Findings may represent worsening ileus but a distal colonic obstruction  cannot be excluded, particularly since the pelvic region is not imaged on this examination.  If there is clinical concern for colonic obstruction, suggest a contrast enema for further evaluation.   Original Report Authenticated By: Richarda Overlie, M.D.    Dg Abd Portable 1v  06/03/2012  *RADIOLOGY REPORT*  Clinical Data: Colonic distention  PORTABLE ABDOMEN - 1 VIEW  Comparison: 06/02/2012  Findings: Gaseous colonic distention persists without substantial interval change.  There is some mild gaseous small bowel distention associated as well.  Rectal tube overlies the lower midline pelvis. Visualized bony structures are unremarkable.  IMPRESSION: No substantial change in gaseous distention of the colon.   Original Report Authenticated By: Kennith Center, M.D.    Dg Abd Portable 1v  06/02/2012  *RADIOLOGY REPORT*  Clinical Data: Constipation/abdominal distension.  PORTABLE ABDOMEN - 1 VIEW  Comparison: 05/30/2012  Findings: There are multiple air-filled loops of large and small bowel with continued prominence of a colonic loop in the left abdomen.  No definite free air or air-fluid levels.  Remainder of the exam is unchanged.  IMPRESSION: Continued air filled loops of large and small bowel with mild prominence of a colonic loop in the left abdomen.  Findings are likely due to ileus.  Recommend follow up as clinically indicated.   Original Report Authenticated By: Elberta Fortis, M.D.    Dg Abd Portable 1v  05/30/2012  *RADIOLOGY REPORT*  Clinical Data: History of abdominal distention.  History of small amount of bowel movements the last couple of days.  Nausea. History of constipation.  History of urinary tract infections.  PORTABLE ABDOMEN - 1 VIEW  Comparison: 05/29/2012.  Findings: There are multiple gas-filled loops of bowel which appear very dilated.  These appear to be colonic.  There is some gas within the rectum.  The rectum is not dilated.  No significant small-bowel dilatation cannot be appreciated.  There  is mild gaseous distention of the mid and distal portion of the stomach. Fecal burden is small.  No opaque calculi are seen.  There is degenerative spondylosis.  IMPRESSION: Multiple gas-filled loops of intestine which appear dilated and appear to be colonic.  There is gas within the nondilated rectum. The most distended loop is in the lower left abdomen.  It was distended on the previous study to  a caliber of 8.6 cm.  On the current study it is distended up to caliber of 10 cm.  No colonic wall edema or pneumatosis is evident. Fecal burden appears small. Colonic partial obstruction cannot be excluded.   Original Report Authenticated By: Onalee Hua Call    Dg Abd Portable 2v  05/29/2012  *RADIOLOGY REPORT*  Clinical Data: Chest pain.  Abdominal pain.  PORTABLE ABDOMEN - 2 VIEW  Comparison: 03/19/2008 CT.  Findings: Nonobstructive bowel gas pattern is present.  There is gaseous distention of colon in the left mid abdomen, likely within redundant sigmoid.  No gross plain film evidence of free air.  Prominent stool is present in the right colon.  Monitoring leads project over the lower chest along with a defibrillator pad.  Gas is present in the anatomic pelvis however the rectum is not visualized on these two views.  IMPRESSION: Gaseous distention of the colon.  Overall bowel gas pattern is nonobstructive.   Original Report Authenticated By: Andreas Newport, M.D.    Dg Colon W/water Sol Cm  06/05/2012  *RADIOLOGY REPORT*  Clinical Data: Distended abdomen with diffusely dilated colon. Question colonic obstruction.  WATER SOLUBLE CONTRAST ENEMA  Technique:  Initial scout AP supine abdominal image was not repeated, having been performed earlier today.  Water soluble contrast was introduced into the colon in a retrograde fashion and refluxed from the rectum to the cecum.  Spot images of the colon followed by overhead radiographs were obtained.  Fluoroscopy time: 1.37 minutes.  Comparison:  Abdominal radiographs  06/05/2012 and 06/03/2012. Abdominal pelvic CT 03/19/2008.  Findings:  The colon is diffusely distended, especially the sigmoid colon which is moderately distended and elongated.  Contrast refluxes freely through the sigmoid colon into the descending colon.  By turning the patient, I was able to maneuver the contrast into the transverse colon, and subsequently into the right colon. There is no evidence of colonic obstruction or perforation.  No focal mucosal lesions are identified.  Mucosal assessment is limited by the patient's size and water soluble technique.  A post drainage/post evacuation view shows mild improvement in the diffuse colonic distension.  IMPRESSION: No evidence of colonic obstruction, focal mucosal lesion or volvulus.  The colon is diffusely dilated and elongated.   Original Report Authenticated By: Carey Bullocks, M.D.     Microbiology: Recent Results (from the past 240 hour(s))  CULTURE, BLOOD (ROUTINE X 2)     Status: Normal   Collection Time   05/31/12 12:16 PM      Component Value Range Status Comment   Specimen Description BLOOD RIGHT THUMB   Final    Special Requests BOTTLES DRAWN AEROBIC ONLY 5.5CC   Final    Culture  Setup Time 05/31/2012 18:57   Final    Culture NO GROWTH 5 DAYS   Final    Report Status 06/06/2012 FINAL   Final   CULTURE, BLOOD (ROUTINE X 2)     Status: Normal   Collection Time   05/31/12 12:27 PM      Component Value Range Status Comment   Specimen Description BLOOD LEFT HAND   Final    Special Requests BOTTLES DRAWN AEROBIC ONLY [facility]   Final    Culture  Setup Time 05/31/2012 18:57   Final    Culture NO GROWTH 5 DAYS   Final    Report Status 06/06/2012 FINAL   Final      Labs: Basic Metabolic Panel:  Lab 06/07/12 4098 06/06/12 0440 06/05/12 0530 06/04/12 1191  NA 134* 134* 137 136  K 4.0 4.4 4.1 4.3  CL 102 100 102 101  CO2 27 27 26 28   GLUCOSE 113* 119* 124* 183*  BUN 10 14 15 16   CREATININE 1.37* 1.44* 1.40* 1.46*  CALCIUM 8.6 8.7 8.5  8.8  MG 2.4 -- -- --  PHOS 2.9 -- -- --   Liver Function Tests: No results found for this basename: AST:5,ALT:5,ALKPHOS:5,BILITOT:5,PROT:5,ALBUMIN:5 in the last 168 hours No results found for this basename: LIPASE:5,AMYLASE:5 in the last 168 hours No results found for this basename: AMMONIA:5 in the last 168 hours CBC:  Lab 06/10/12 0645 06/09/12 0728 06/08/12 0523 06/06/12 1731 06/05/12 0530  WBC 6.5 5.2 5.4 5.7 5.3  NEUTROABS -- -- -- 3.8 --  HGB 10.1* 10.0* 9.6* 10.0* 10.2*  HCT 29.7* 30.0* 29.9* 30.0* 30.5*  MCV 88.4 88.5 89.3 87.7 87.1  PLT 201 179 189 194 184   Cardiac Enzymes: No results found for this basename: CKTOTAL:5,CKMB:5,CKMBINDEX:5,TROPONINI:5 in the last 168 hours BNP: BNP (last 3 results) No results found for this basename: PROBNP:3 in the last 8760 hours CBG:  Lab 06/10/12 0603 06/09/12 2101 06/09/12 1610 06/09/12 1446 06/09/12 1358  GLUCAP 162* 186* 274* 348* 356*       Signed:  Rhetta Mura  [facility] 06/10/2012, 8:55 AM

## 2012-06-10 NOTE — Progress Notes (Signed)
See updated d/c summary  Pleas Koch, MD [facility] [phone]

## 2012-06-10 NOTE — Clinical Social Work Note (Signed)
CSW sent dc info to ALF and discussed with daughter Tyler [name] transportation at [employer]. Pt's family is requesting ambulance transfer at dc d/t to Pt's weakness and loss of use on left side.  CSW contacted [facility] for transportation back to [place].  Frederico Hamman, LCSW [phone]

## 2012-06-10 NOTE — Progress Notes (Addendum)
Patient ID: Tyler [name], male   DOB: Jan 04, 1934, 78 y.o.   MRN: [id number]   SUBJECTIVE: The patient is stable and being prepared to go home. An appointment has been made to be seen by [name] in the office on December 27. I will be checking with our team to get more specifics of the patient and his daughter about his wound care and and about an early post hospital wound check within the next few weeks. He is stable.   Filed Vitals:   06/10/12 0419 06/10/12 0500 06/10/12 0841 06/10/12 0915  BP: 157/68   126/69  Pulse: 68   68  Temp: 98.4 F (36.9 C)   98.2 F (36.8 C)  TempSrc: Oral   Oral  Resp: 18   22  Height:      Weight: 268 lb 4.8 oz (121.7 kg) 268 lb 4.8 oz (121.7 kg)    SpO2: 98%  97% 96%    Intake/Output Summary (Last 24 hours) at 06/10/12 0924 Last data filed at 06/10/12 0423  Gross per 24 hour  Intake    483 ml  Output   1650 ml  Net  -1167 ml    LABS: Basic Metabolic Panel: No results found for this basename: NA:2,K:2,CL:2,CO2:2,GLUCOSE:2,BUN:2,CREATININE:2,CALCIUM:2,MG:2,PHOS:2 in the last 72 hours Liver Function Tests: No results found for this basename: AST:2,ALT:2,ALKPHOS:2,BILITOT:2,PROT:2,ALBUMIN:2 in the last 72 hours No results found for this basename: LIPASE:2,AMYLASE:2 in the last 72 hours CBC:  Basename 06/10/12 0645 06/09/12 0728  WBC 6.5 5.2  NEUTROABS -- --  HGB 10.1* 10.0*  HCT 29.7* 30.0*  MCV 88.4 88.5  PLT 201 179   Cardiac Enzymes: No results found for this basename: CKTOTAL:3,CKMB:3,CKMBINDEX:3,TROPONINI:3 in the last 72 hours BNP: No components found with this basename: POCBNP:3 D-Dimer: No results found for this basename: DDIMER:2 in the last 72 hours Hemoglobin A1C: No results found for this basename: HGBA1C in the last 72 hours Fasting Lipid Panel: No results found for this basename: CHOL,HDL,LDLCALC,TRIG,CHOLHDL,LDLDIRECT in the last 72 hours Thyroid Function Tests: No results found for this basename:  TSH,T4TOTAL,FREET3,T3FREE,THYROIDAB in the last 72 hours  RADIOLOGY: Dg Chest 2 View  06/08/2012  *RADIOLOGY REPORT*  Clinical Data: Pacemaker implantation.  CHEST - 2 VIEW  Comparison: 05/29/2012  Findings: Two views of the chest demonstrate placement of a left- sided cardiac pacemaker.  There is a lead in the region of the right ventricle and the other is near the cavoatrial junction.  Low lung volumes and slightly prominent interstitial lung markings.  No evidence for a pneumothorax.  The lateral view raises concern for a small right pleural effusion.  Difficult to exclude a small left pleural effusion.  IMPRESSION: Placement of left cardiac pacemaker without a pneumothorax.  Possible small pleural effusions.   Original Report Authenticated By: Richarda Overlie, M.D.    Dg Abd 1 View  06/07/2012  *RADIOLOGY REPORT*  Clinical Data: Abdominal pain, distention.  ABDOMEN - 1 VIEW  Comparison: 06/05/2012  Findings: Severe diffuse gaseous distention of the colon again noted.  Transverse colon again measures approximately 12 cm in diameter.  Rectosigmoid colon is less distended, but no definite evidence for an distal obstruction.  There is oral contrast material within the rectosigmoid colon.  No free air.  No organomegaly.  IMPRESSION: Severe gaseous distention of the colon, likely severe ileus.   Original Report Authenticated By: Charlett Nose, M.D.    Dg Chest Port 1 View  05/29/2012  *RADIOLOGY REPORT*  Clinical Data: Bradycardia.  Diabetes.  Nausea.  PORTABLE CHEST - 1 VIEW  Comparison: 10/07/2010.  Findings: Low volume chest.  Apical lordotic projection. Defibrillator pads project over the chest.  Monitoring leads are also present on the chest.  Allowing for the low volumes of inspiration, the heart lungs appear within normal limits.  IMPRESSION: Low volume chest.   Original Report Authenticated By: Andreas Newport, M.D.    Dg Abd Portable 1v  06/05/2012  *RADIOLOGY REPORT*  Clinical Data: Follow up ileus.   PORTABLE ABDOMEN - 1 VIEW  Comparison: 06/03/2012  Findings: Portable supine view of the abdomen again demonstrates marked gaseous distention of the colon.  There appears to be a large distended bowel loop in the left upper abdomen, measuring up to 12 cm.  This could be associated with transverse colon or sigmoid colon.  The pelvic region was not imaged.  IMPRESSION: Concern for increased gaseous distention of the colon.  Findings may represent worsening ileus but a distal colonic obstruction cannot be excluded, particularly since the pelvic region is not imaged on this examination.  If there is clinical concern for colonic obstruction, suggest a contrast enema for further evaluation.   Original Report Authenticated By: Richarda Overlie, M.D.    Dg Abd Portable 1v  06/03/2012  *RADIOLOGY REPORT*  Clinical Data: Colonic distention  PORTABLE ABDOMEN - 1 VIEW  Comparison: 06/02/2012  Findings: Gaseous colonic distention persists without substantial interval change.  There is some mild gaseous small bowel distention associated as well.  Rectal tube overlies the lower midline pelvis. Visualized bony structures are unremarkable.  IMPRESSION: No substantial change in gaseous distention of the colon.   Original Report Authenticated By: Kennith Center, M.D.    Dg Abd Portable 1v  06/02/2012  *RADIOLOGY REPORT*  Clinical Data: Constipation/abdominal distension.  PORTABLE ABDOMEN - 1 VIEW  Comparison: 05/30/2012  Findings: There are multiple air-filled loops of large and small bowel with continued prominence of a colonic loop in the left abdomen.  No definite free air or air-fluid levels.  Remainder of the exam is unchanged.  IMPRESSION: Continued air filled loops of large and small bowel with mild prominence of a colonic loop in the left abdomen.  Findings are likely due to ileus.  Recommend follow up as clinically indicated.   Original Report Authenticated By: Elberta Fortis, M.D.    Dg Abd Portable 1v  05/30/2012   *RADIOLOGY REPORT*  Clinical Data: History of abdominal distention.  History of small amount of bowel movements the last couple of days.  Nausea. History of constipation.  History of urinary tract infections.  PORTABLE ABDOMEN - 1 VIEW  Comparison: 05/29/2012.  Findings: There are multiple gas-filled loops of bowel which appear very dilated.  These appear to be colonic.  There is some gas within the rectum.  The rectum is not dilated.  No significant small-bowel dilatation cannot be appreciated.  There is mild gaseous distention of the mid and distal portion of the stomach. Fecal burden is small.  No opaque calculi are seen.  There is degenerative spondylosis.  IMPRESSION: Multiple gas-filled loops of intestine which appear dilated and appear to be colonic.  There is gas within the nondilated rectum. The most distended loop is in the lower left abdomen.  It was distended on the previous study to a caliber of 8.6 cm.  On the current study it is distended up to caliber of 10 cm.  No colonic wall edema or pneumatosis is evident. Fecal burden appears small. Colonic partial obstruction cannot be excluded.   Original  Report Authenticated By: Onalee Hua Call    Dg Abd Portable 2v  05/29/2012  *RADIOLOGY REPORT*  Clinical Data: Chest pain.  Abdominal pain.  PORTABLE ABDOMEN - 2 VIEW  Comparison: 03/19/2008 CT.  Findings: Nonobstructive bowel gas pattern is present.  There is gaseous distention of colon in the left mid abdomen, likely within redundant sigmoid.  No gross plain film evidence of free air.  Prominent stool is present in the right colon.  Monitoring leads project over the lower chest along with a defibrillator pad.  Gas is present in the anatomic pelvis however the rectum is not visualized on these two views.  IMPRESSION: Gaseous distention of the colon.  Overall bowel gas pattern is nonobstructive.   Original Report Authenticated By: Andreas Newport, M.D.    Dg Colon W/water Sol Cm  06/05/2012  *RADIOLOGY  REPORT*  Clinical Data: Distended abdomen with diffusely dilated colon. Question colonic obstruction.  WATER SOLUBLE CONTRAST ENEMA  Technique:  Initial scout AP supine abdominal image was not repeated, having been performed earlier today.  Water soluble contrast was introduced into the colon in a retrograde fashion and refluxed from the rectum to the cecum.  Spot images of the colon followed by overhead radiographs were obtained.  Fluoroscopy time: 1.37 minutes.  Comparison:  Abdominal radiographs 06/05/2012 and 06/03/2012. Abdominal pelvic CT 03/19/2008.  Findings:  The colon is diffusely distended, especially the sigmoid colon which is moderately distended and elongated.  Contrast refluxes freely through the sigmoid colon into the descending colon.  By turning the patient, I was able to maneuver the contrast into the transverse colon, and subsequently into the right colon. There is no evidence of colonic obstruction or perforation.  No focal mucosal lesions are identified.  Mucosal assessment is limited by the patient's size and water soluble technique.  A post drainage/post evacuation view shows mild improvement in the diffuse colonic distension.  IMPRESSION: No evidence of colonic obstruction, focal mucosal lesion or volvulus.  The colon is diffusely dilated and elongated.   Original Report Authenticated By: Carey Bullocks, M.D.     PHYSICAL EXAM   Patient is oriented to person time and place. Affect is normal. I've spoken with his daughter in the room. The pacemaker site is bandaged. He is stable.   TELEMETRY:  I have reviewed telemetry today June 10, 2012. There's sinus rhythm. In addition there is some intermittent pacing.   ASSESSMENT AND PLAN:  Principal Problem:  *Junctional rhythm  Status post permanent pacemaker this admission.      He is stable and ready for discharge. We are making sure that all appropriate arrangements are made for his pacemaker followup. There is an appointment  listed for December 27. This may have been an old appointment. Our EP team is aware and they will be seeing the patient to be sure that he has his post hospital pacemaker site check and all appropriate followup.   Willa Rough 06/10/2012 9:24 AM

## 2012-06-17 ENCOUNTER — Ambulatory Visit (INDEPENDENT_AMBULATORY_CARE_PROVIDER_SITE_OTHER): Payer: Medicare Other | Admitting: *Deleted

## 2012-06-17 ENCOUNTER — Encounter: Payer: Self-pay | Admitting: Internal Medicine

## 2012-06-17 DIAGNOSIS — I4891 Unspecified atrial fibrillation: Secondary | ICD-10-CM

## 2012-06-17 DIAGNOSIS — R001 Bradycardia, unspecified: Secondary | ICD-10-CM

## 2012-06-17 DIAGNOSIS — I498 Other specified cardiac arrhythmias: Secondary | ICD-10-CM

## 2012-06-17 LAB — PACEMAKER DEVICE OBSERVATION
AL AMPLITUDE: 4.7 mv
AL IMPEDENCE PM: 525 Ohm
ATRIAL PACING PM: 9.8
DEVICE MODEL PM: 7411677
RV LEAD IMPEDENCE PM: 662.5 Ohm
RV LEAD THRESHOLD: 0.5 V
VENTRICULAR PACING PM: 1

## 2012-06-17 NOTE — Progress Notes (Signed)
Wound check-PPM 

## 2012-07-19 ENCOUNTER — Encounter: Payer: Medicare Other | Admitting: Internal Medicine

## 2012-09-19 ENCOUNTER — Ambulatory Visit (INDEPENDENT_AMBULATORY_CARE_PROVIDER_SITE_OTHER): Payer: Medicare Other | Admitting: Internal Medicine

## 2012-09-19 ENCOUNTER — Encounter: Payer: Self-pay | Admitting: Internal Medicine

## 2012-09-19 VITALS — BP 137/97 | HR 71

## 2012-09-19 DIAGNOSIS — R001 Bradycardia, unspecified: Secondary | ICD-10-CM

## 2012-09-19 DIAGNOSIS — I498 Other specified cardiac arrhythmias: Secondary | ICD-10-CM

## 2012-09-19 DIAGNOSIS — I10 Essential (primary) hypertension: Secondary | ICD-10-CM

## 2012-09-19 DIAGNOSIS — I4891 Unspecified atrial fibrillation: Secondary | ICD-10-CM

## 2012-09-19 DIAGNOSIS — I452 Bifascicular block: Secondary | ICD-10-CM

## 2012-09-19 LAB — PACEMAKER DEVICE OBSERVATION
AL IMPEDENCE PM: 575 Ohm
BAMS-0001: 150 {beats}/min
BAMS-0003: 70 {beats}/min
BATTERY VOLTAGE: 2.9779 V
DEVICE MODEL PM: 7411677

## 2012-09-19 NOTE — Patient Instructions (Addendum)
Your physician wants you to follow-up in: November Dr [name] Bonita Quin will receive a reminder letter in the mail two months in advance. If you don't receive a letter, please call our office to schedule the follow-up appointment.

## 2012-09-19 NOTE — Progress Notes (Signed)
PCP: [name], MD   Tyler [name] is a 79 y.o. male who presents today for routine electrophysiology followup.  Since his recent pacemaker implantation, the patient reports doing very well.  He has occasional dizziness but feels that this is much improved in general.  Today, he denies symptoms of palpitations, chest pain, shortness of breath,  lower extremity edema, presyncope, or syncope.  The patient is otherwise without complaint today.   Past Medical History  Diagnosis Date  . Diabetes mellitus 1980  . Hyperlipidemia 1980  . Hypertension 1980  . Asthma   . Allergic rhinitis   . GERD (gastroesophageal reflux disease)   . BPH (benign prostatic hypertrophy)     s/p thermo therapy, has bladdero utlet obst. and instablitiy, incontient  . Anemia   . Chronic constipation   . Sick sinus syndrome   . Paroxysmal atrial fibrillation     status post direct cardioversion- normal cardiac cath 2003, with normal left ventricular function (done after an abnormal cardiolite)  . Bradycardia     severe, on high doese AV-nodal blockers  . Bifascicular block     chronic  . Obesity   . Sleep apnea     noncomplicance w/ CPAP  . Hypoxia     in the past, likely multifactorial  . Gastroparesis     dx in 2006 through gastric scan  . Gynecomastia     nomral mammograms 11/2008  . Shortness of breath   . Chronic kidney disease   . Globus sensation     2012 EGD, Ba swallow, both negative.  MBSS 2012 negative for dysphagia.    Past Surgical History  Procedure Laterality Date  . Cataract extraction      bilateral  . Rotator cuff repair      left  . Cardiac catheterization  2003    normal, left ventricular function (done after an abnormal cardiolite)  . Tumor removal      off arms  . Pacemaker insertion  06/07/12    SJM implanted by Dr [name] for symptomatic bradycardia    Current Outpatient Prescriptions  Medication Sig Dispense Refill  . acetaminophen (TYLENOL) 500 MG tablet Take  1,000 mg by mouth every 6 (six) hours as needed. For severe headache      . albuterol (VENTOLIN HFA) 108 (90 BASE) MCG/ACT inhaler Inhale 2 puffs into the lungs every 4 (four) hours as needed. For shortness of breath      . Bepotastine Besilate (BEPREVE) 1.5 % SOLN Place 1 drop into both eyes 2 (two) times daily.      . cycloSPORINE (RESTASIS) 0.05 % ophthalmic emulsion Place 1 drop into both eyes 2 (two) times daily.       Marland Kitchen donepezil (ARICEPT) 10 MG tablet Take 10 mg by mouth at bedtime.        Marland Kitchen doxazosin (CARDURA) 2 MG tablet Take 1 tablet (2 mg total) by mouth at bedtime.  30 tablet  0  . famotidine (PEPCID) 40 MG tablet Take 40 mg by mouth at bedtime.      . ferrous sulfate 325 (65 FE) MG tablet Take 325 mg by mouth daily with breakfast.        . fexofenadine (ALLEGRA) 180 MG tablet Take 180 mg by mouth daily as needed. For seasonal allergies      . Fluticasone-Salmeterol (ADVAIR) 100-50 MCG/DOSE AEPB Inhale 1 puff into the lungs 2 (two) times daily. Rinse mouth and spit after each use      .  hydroxypropyl methylcellulose (ISOPTO TEARS) 2.5 % ophthalmic solution Place 1 drop into both eyes every 2 (two) hours as needed. For dry eyes      . ipratropium (ATROVENT) 0.06 % nasal spray Place 2 sprays into the nose 3 (three) times daily.      . metFORMIN (GLUCOPHAGE-XR) 750 MG 24 hr tablet Take 750 mg by mouth daily with breakfast.        . Multiple Vitamin (MULTIVITAMIN WITH MINERALS) TABS Take 1 tablet by mouth every morning.      . neomycin-bacitracin-polymyxin (NEOSPORIN) OINT Apply 1 application topically daily. To left big toe      . pantoprazole (PROTONIX) 40 MG tablet Take 40 mg by mouth 2 (two) times daily.       . sennosides-docusate sodium (SENOKOT-S) 8.6-50 MG tablet Take 2 tablets by mouth at bedtime as needed. For constipation      . sertraline (ZOLOFT) 25 MG tablet Take 25 mg by mouth daily.        . simvastatin (ZOCOR) 20 MG tablet Take 20 mg by mouth at bedtime.        . sodium  chloride (OCEAN) 0.65 % SOLN nasal spray Place 1 spray into the nose 4 (four) times daily.      Marland Kitchen torsemide (DEMADEX) 100 MG tablet Take 50 mg by mouth daily.        Marland Kitchen trolamine salicylate (ASPERCREME) 10 % cream Apply 1 application topically 3 (three) times daily. To left shoulder      . Vitamin D, Ergocalciferol, (DRISDOL) 50000 UNITS CAPS Take 50,000 Units by mouth every 7 (seven) days. Fridays      . warfarin (COUMADIN) 5 MG tablet Take 7.5-10 mg by mouth daily. Take 2 tablets (=10mg ) on Monday, Wednesday, and Friday.  Take 1.5 tablet (=7.5mg ) on Tuesday, Thursday, Saturday, and sunday      . insulin aspart (NOVOLOG) 100 UNIT/ML injection Inject into the skin 3 (three) times daily before meals. Sliding scale      . insulin glargine (LANTUS) 100 UNIT/ML injection Inject 20 Units into the skin at bedtime.      . metoCLOPramide (REGLAN) 10 MG tablet Take 0.5 tablets (5 mg total) by mouth at bedtime.  30 tablet  11   No current facility-administered medications for this visit.    Physical Exam: Filed Vitals:   09/19/12 0937  BP: 137/97  Pulse: 71    GEN- The patient is elderly appearing, alert and oriented x 3 today.   Head- normocephalic, atraumatic Eyes-  Sclera clear, conjunctiva pink Ears- hearing intact Oropharynx- clear Lungs- Clear to ausculation bilaterally, normal work of breathing Chest- pacemaker pocket is well healed Heart- Regular rate and rhythm, no murmurs, rubs or gallops, PMI not laterally displaced GI- soft, NT, ND, + BS Extremities- no clubbing, cyanosis, or edema In a wheelchair today  Pacemaker interrogation- reviewed in detail today,  See PACEART report ekg today reveals sinus rhythm, RBBB, LAHB  Assessment and Plan:  1. Bradycardia Normal pacemaker function See Pace Art report No changes today  2. Afib Maintaining sinus rhythm Continue coumadin  3. HTN Stable No change required today

## 2012-10-30 ENCOUNTER — Telehealth: Payer: Self-pay | Admitting: Internal Medicine

## 2012-10-30 NOTE — Telephone Encounter (Signed)
New problem    Pt complaining of pain in left shoulder area past elbow-really tender in pacemaker area

## 2012-10-30 NOTE — Telephone Encounter (Signed)
Spoke with the nurse at the facility and it may be that he has protected his shoulder so much in fear of hurting that it is frozen.  She is going to have the facility MD look at it at=nd write a PT order.  She will call me back if they fill he needs to come in and have his device interrogated

## 2012-11-04 ENCOUNTER — Telehealth: Payer: Self-pay | Admitting: Internal Medicine

## 2012-11-04 NOTE — Telephone Encounter (Signed)
New problem    Pt complaining of pain around his pacemaker

## 2012-11-04 NOTE — Telephone Encounter (Signed)
Patient scheduled for wound re-check 4/17 @ 4:30pm

## 2012-11-07 ENCOUNTER — Ambulatory Visit (INDEPENDENT_AMBULATORY_CARE_PROVIDER_SITE_OTHER): Payer: Medicare Other | Admitting: *Deleted

## 2012-11-07 DIAGNOSIS — I4891 Unspecified atrial fibrillation: Secondary | ICD-10-CM

## 2012-11-08 NOTE — Progress Notes (Signed)
Wound check for c/o pain at pacer site and down left arm.  Site is well healed without any redness or edema.  No edema noted in left arm.  CXR to be done per [name].

## 2013-03-13 ENCOUNTER — Ambulatory Visit: Payer: Medicare Other | Admitting: Endocrinology

## 2013-03-20 ENCOUNTER — Encounter: Payer: Self-pay | Admitting: Endocrinology

## 2013-03-20 ENCOUNTER — Ambulatory Visit (INDEPENDENT_AMBULATORY_CARE_PROVIDER_SITE_OTHER): Payer: Medicare Other | Admitting: Endocrinology

## 2013-03-20 VITALS — BP 110/54 | HR 65 | Temp 98.3°F | Resp 12

## 2013-03-20 DIAGNOSIS — N289 Disorder of kidney and ureter, unspecified: Secondary | ICD-10-CM

## 2013-03-20 DIAGNOSIS — E7800 Pure hypercholesterolemia, unspecified: Secondary | ICD-10-CM

## 2013-03-20 DIAGNOSIS — E119 Type 2 diabetes mellitus without complications: Secondary | ICD-10-CM

## 2013-03-20 LAB — COMPREHENSIVE METABOLIC PANEL
ALT: 19 U/L (ref 0–53)
AST: 19 U/L (ref 0–37)
CO2: 29 mEq/L (ref 19–32)
Calcium: 9 mg/dL (ref 8.4–10.5)
Chloride: 98 mEq/L (ref 96–112)
Creatinine, Ser: 1.8 mg/dL — ABNORMAL HIGH (ref 0.4–1.5)
GFR: 47.22 mL/min — ABNORMAL LOW (ref >60.00)
Potassium: 4.5 mEq/L (ref 3.5–5.1)
Sodium: 137 mEq/L (ref 135–145)
Total Protein: 7.2 g/dL (ref 6.0–8.3)

## 2013-03-20 LAB — LIPID PANEL
Total CHOL/HDL Ratio: 5
Triglycerides: 271 mg/dL — ABNORMAL HIGH (ref 0.0–149.0)

## 2013-03-20 NOTE — Patient Instructions (Addendum)
Lunch Novolog 8 and supper 10 units

## 2013-03-20 NOTE — Progress Notes (Signed)
Patient ID: Tyler [name], male   DOB: 24-Oct-1933, 79 y.o.   MRN: [id number]  Tyler [name] is an 79 y.o. male.   Reason for Appointment: Diabetes follow-up   History of Present Illness   Diagnosis: Type 2 DIABETES MELITUS, long-standing  He has been on insulin for several years with reasonably good control. However he appears to need to less basal insulin in the daytime Currently difficult to evaluate his home readings as he has no print out in an organized manner by time of day He thinks he is getting his mealtime insulin before meals consistently although not clear how reliable his history is He is generally having high readings at suppertime and often after supper Prior records are not available     Oral hypoglycemic drugs: Metformin       Side effects from medications: None Insulin regimen: NovoLog 10-6-AC, Lantus 18 a.m. and 30 5 PM        Proper timing of medications in relation to meals: Yes.         Monitors blood glucose: Once a day.    Glucometer: One Touch.          Blood Glucose readings from meter download: readings before breakfast: 120- 259. LUNCH 103-192. SUPPER 134-275. 8 PM 182-401  Hypoglycemia frequency:  minimal, recently lowest about 85.          Meals: 3 meals per day.          Physical activity: exercise: Unable to do any             Wt Readings from Last 3 Encounters:  06/10/12 268 lb 4.8 oz (121.7 kg)  06/10/12 268 lb 4.8 oz (121.7 kg)  09/18/11 264 lb 12.8 oz (120.112 kg)       Medication List       This list is accurate as of: 03/20/13  1:54 PM.  Always use your most recent med list.               acetaminophen 500 MG tablet  Commonly known as:  TYLENOL  Take 1,000 mg by mouth every 6 (six) hours as needed. For severe headache     Azelastine-Fluticasone 137-50 MCG/ACT Susp  Place into the nose.     BEPREVE 1.5 % Soln  Generic drug:  Bepotastine Besilate  Place 1 drop into both eyes 2 (two) times daily.     cycloSPORINE 0.05 %  ophthalmic emulsion  Commonly known as:  RESTASIS  Place 1 drop into both eyes 2 (two) times daily.     donepezil 10 MG tablet  Commonly known as:  ARICEPT  Take 10 mg by mouth at bedtime.     doxazosin 2 MG tablet  Commonly known as:  CARDURA  Take 1 tablet (2 mg total) by mouth at bedtime.     famotidine 40 MG tablet  Commonly known as:  PEPCID  Take 40 mg by mouth at bedtime.     ferrous sulfate 325 (65 FE) MG tablet  Take 325 mg by mouth daily with breakfast.     fexofenadine 180 MG tablet  Commonly known as:  ALLEGRA  Take 180 mg by mouth daily as needed. For seasonal allergies     Fluticasone-Salmeterol 100-50 MCG/DOSE Aepb  Commonly known as:  ADVAIR  Inhale 1 puff into the lungs 2 (two) times daily. Rinse mouth and spit after each use     hydroxypropyl methylcellulose 2.5 % ophthalmic solution  Commonly known as:  ISOPTO TEARS  Place 1 drop into both eyes every 2 (two) hours as needed. For dry eyes     insulin aspart 100 UNIT/ML injection  Commonly known as:  novoLOG  Inject into the skin 3 (three) times daily before meals. Sliding scale 10-6-8     insulin glargine 100 UNIT/ML injection  Commonly known as:  LANTUS  Inject 18 Units into the skin 2 (two) times daily. 18 units in am and 35 units at bedtime     ipratropium 0.06 % nasal spray  Commonly known as:  ATROVENT  Place 2 sprays into the nose 3 (three) times daily.     metFORMIN 750 MG 24 hr tablet  Commonly known as:  GLUCOPHAGE-XR  Take 750 mg by mouth daily with breakfast.     metoCLOPramide 10 MG tablet  Commonly known as:  REGLAN  Take 0.5 tablets (5 mg total) by mouth at bedtime.     montelukast 10 MG tablet  Commonly known as:  SINGULAIR  Take 10 mg by mouth at bedtime. 1/2 tablet at bedtime     multivitamin with minerals Tabs tablet  Take 1 tablet by mouth every morning.     neomycin-bacitracin-polymyxin Oint  Commonly known as:  NEOSPORIN  Apply 1 application topically daily. To left  big toe     pantoprazole 40 MG tablet  Commonly known as:  PROTONIX  Take 40 mg by mouth 2 (two) times daily.     sennosides-docusate sodium 8.6-50 MG tablet  Commonly known as:  SENOKOT-S  Take 2 tablets by mouth at bedtime as needed. For constipation     sertraline 25 MG tablet  Commonly known as:  ZOLOFT  Take 25 mg by mouth daily.     simvastatin 20 MG tablet  Commonly known as:  ZOCOR  Take 20 mg by mouth at bedtime.     sodium chloride 0.65 % Soln nasal spray  Commonly known as:  OCEAN  Place 1 spray into the nose 4 (four) times daily.     torsemide 100 MG tablet  Commonly known as:  DEMADEX  Take 50 mg by mouth daily.     trolamine salicylate 10 % cream  Commonly known as:  ASPERCREME  Apply 1 application topically 3 (three) times daily. To left shoulder     VENTOLIN HFA 108 (90 BASE) MCG/ACT inhaler  Generic drug:  albuterol  Inhale 2 puffs into the lungs every 4 (four) hours as needed. For shortness of breath     Vitamin D (Ergocalciferol) 50000 UNITS Caps capsule  Commonly known as:  DRISDOL  Take 50,000 Units by mouth every 7 (seven) days. Fridays     warfarin 5 MG tablet  Commonly known as:  COUMADIN  Take 7.5-10 mg by mouth daily. Take 2 tablets (=10mg ) on Monday, Wednesday, and Friday.  Take 1.5 tablet (=7.5mg ) on Tuesday, Thursday, Saturday, and sunday        Allergies: No Known Allergies  Past Medical History  Diagnosis Date  . Diabetes mellitus 1980  . Hyperlipidemia 1980  . Hypertension 1980  . Asthma   . Allergic rhinitis   . GERD (gastroesophageal reflux disease)   . BPH (benign prostatic hypertrophy)     s/p thermo therapy, has bladdero utlet obst. and instablitiy, incontient  . Anemia   . Chronic constipation   . Sick sinus syndrome   . Paroxysmal atrial fibrillation     status post direct cardioversion- normal cardiac cath 2003, with normal left ventricular function (  done after an abnormal cardiolite)  . Bradycardia     severe, on  high doese AV-nodal blockers  . Bifascicular block     chronic  . Obesity   . Sleep apnea     noncomplicance w/ CPAP  . Hypoxia     in the past, likely multifactorial  . Gastroparesis     dx in 2006 through gastric scan  . Gynecomastia     nomral mammograms 11/2008  . Shortness of breath   . Chronic kidney disease   . Globus sensation     2012 EGD, Ba swallow, both negative.  MBSS 2012 negative for dysphagia.     Past Surgical History  Procedure Laterality Date  . Cataract extraction      bilateral  . Rotator cuff repair      left  . Cardiac catheterization  2003    normal, left ventricular function (done after an abnormal cardiolite)  . Tumor removal      off arms  . Pacemaker insertion  06/07/12    SJM implanted by Dr [name] for symptomatic bradycardia    Family History  Problem Relation Age of Onset  . Colon cancer Neg Hx   . Prostate cancer Maternal Uncle   . Heart disease Paternal Uncle     x 3, paternal aunt    Social History:  reports that he quit smoking about 41 years ago. His smoking use included Cigarettes. He smoked 0.00 packs per day. He has quit using smokeless tobacco. His smokeless tobacco use included Chew. He reports that he does not drink alcohol or use illicit drugs.  Review of Systems:  HYPERTENSION:  appears well controlled with current medications including doxazosin History of atrial fibrillation, on Coumadin  HYPERLIPIDEMIA: The lipid abnormality consists of elevated LDL.     Examination:   BP 110/54  Pulse 65  Temp(Src) 98.3 F (36.8 C)  Resp 12  SpO2 95%  There is no weight on file to calculate BMI.   ASSESSMENT/ PLAN::   Diabetes type 2   The patient's diabetes control appears to be inconsistent with periodic high readings at all times especially at  suppertime  and bedtime His diet is probably not consistent at the nursing home and also his intake may not be the same from day to day He is checking his blood sugars 4 times a  day Since his readings are generally higher at suppertime and bedtime will increase the dosages by 2 units at lunch and supper A1c to be checked  LIPIDS to be checked also, currently only on 20 mg Zocor   Amberlyn Martinezgarcia 03/20/2013, 1:54 PM   Addendum: Creatinine 1.8, will stop metformin  Office Visit on 03/20/2013  Component Date Value Range Status  . Sodium 03/20/2013 137  135 - 145 mEq/L Final  . Potassium 03/20/2013 4.5  3.5 - 5.1 mEq/L Final  . Chloride 03/20/2013 98  96 - 112 mEq/L Final  . CO2 03/20/2013 29  19 - 32 mEq/L Final  . Glucose, Bld 03/20/2013 122* 70 - 99 mg/dL Final  . BUN 11/91/4782 27* 6 - 23 mg/dL Final  . Creatinine, Ser 03/20/2013 1.8* 0.4 - 1.5 mg/dL Final  . Total Bilirubin 03/20/2013 0.5  0.3 - 1.2 mg/dL Final  . Alkaline Phosphatase 03/20/2013 82  39 - 117 U/L Final  . AST 03/20/2013 19  0 - 37 U/L Final  . ALT 03/20/2013 19  0 - 53 U/L Final  . Total Protein 03/20/2013 7.2  6.0 -  8.3 g/dL Final  . Albumin 96/10/5407 3.6  3.5 - 5.2 g/dL Final  . Calcium 81/19/1478 9.0  8.4 - 10.5 mg/dL Final  . GFR 29/56/2130 47.22* >60.00 mL/min Final  . Hemoglobin A1C 03/20/2013 7.6* 4.6 - 6.5 % Final   Glycemic Control Guidelines for People with Diabetes:Non Diabetic:  <6%Goal of Therapy: <7%Additional Action Suggested:  >8%   . Cholesterol 03/20/2013 172  0 - 200 mg/dL Final   ATP III Classification       Desirable:  < 200 mg/dL               Borderline High:  200 - 239 mg/dL          High:  > = 865 mg/dL  . Triglycerides 03/20/2013 271.0* 0.0 - 149.0 mg/dL Final   Normal:  <784 mg/dLBorderline High:  150 - 199 mg/dL  . HDL 03/20/2013 35.50* >39.00 mg/dL Final  . VLDL 69/62/9528 54.2* 0.0 - 40.0 mg/dL Final  . Total CHOL/HDL Ratio 03/20/2013 5   Final                  Men          Women1/2 Average Risk     3.4          3.3Average Risk          5.0          4.42X Average Risk          9.6          7.13X Average Risk          15.0          11.0                      .  Direct LDL 03/20/2013 102.1   Final   Optimal:  <100 mg/dLNear or Above Optimal:  100-129 mg/dLBorderline High:  130-159 mg/dLHigh:  160-189 mg/dLVery High:  >190 mg/dL

## 2013-03-21 ENCOUNTER — Telehealth: Payer: Self-pay | Admitting: *Deleted

## 2013-03-21 LAB — LDL CHOLESTEROL, DIRECT: Direct LDL: 102.1 mg/dL

## 2013-03-21 NOTE — Telephone Encounter (Signed)
Message copied by Hermenia Bers on Fri Mar 21, 2013  1:02 PM ------      Message from: Reather Littler      Created: Fri Mar 21, 2013 12:02 PM       Please fax all results to nursing home ------

## 2013-03-21 NOTE — Telephone Encounter (Signed)
Labs faxed to Nursing home.

## 2013-03-27 ENCOUNTER — Telehealth: Payer: Self-pay | Admitting: Endocrinology

## 2013-03-27 NOTE — Telephone Encounter (Signed)
Need to send order to nursing home  to stop metformin because of decreased renal function

## 2013-03-28 NOTE — Telephone Encounter (Signed)
Orders faxed to [facility]

## 2013-05-29 ENCOUNTER — Encounter: Payer: Self-pay | Admitting: Podiatrist

## 2013-05-29 ENCOUNTER — Ambulatory Visit (INDEPENDENT_AMBULATORY_CARE_PROVIDER_SITE_OTHER): Payer: Medicare Other | Admitting: Podiatrist

## 2013-05-29 VITALS — BP 130/69 | HR 69 | Resp 16 | Ht 70.5 in | Wt 257.0 lb

## 2013-05-29 DIAGNOSIS — E1149 Type 2 diabetes mellitus with other diabetic neurological complication: Secondary | ICD-10-CM

## 2013-05-29 DIAGNOSIS — L97511 Non-pressure chronic ulcer of other part of right foot limited to breakdown of skin: Secondary | ICD-10-CM

## 2013-05-29 DIAGNOSIS — M79609 Pain in unspecified limb: Secondary | ICD-10-CM

## 2013-05-29 DIAGNOSIS — B351 Tinea unguium: Secondary | ICD-10-CM

## 2013-05-29 DIAGNOSIS — E1140 Type 2 diabetes mellitus with diabetic neuropathy, unspecified: Secondary | ICD-10-CM

## 2013-05-29 DIAGNOSIS — L97509 Non-pressure chronic ulcer of other part of unspecified foot with unspecified severity: Secondary | ICD-10-CM

## 2013-05-29 MED ORDER — AMITRIPTYLINE HCL 25 MG PO TABS: 25.0000 mg | ORAL_TABLET | Freq: Every day | ORAL | Status: DC

## 2013-05-29 MED ORDER — CEPHALEXIN 500 MG PO CAPS: 500.0000 mg | ORAL_CAPSULE | Freq: Three times a day (TID) | ORAL | Status: DC

## 2013-05-29 NOTE — Patient Instructions (Signed)
Instructions for Wound Care  The most important step to healing a foot wound is to reduce the pressure on your foot - it is extremely important to stay off your foot as much as possible  Cleanse your foot with saline wash or warm soapy water (dial antibacterial soap or similar).  Blot dry.  Apply prescribed medication to your wound and cover with gauze and a bandage.  May hold bandage in place with Coban (self sticky wrap), Ace bandage or tape.  You may find dressing supplies at your local [employer], [employer], drug store or medical supply store.   If you notice any foul odor, increase in pain, pus, increased swelling, red streaks or generalized redness occurring in your foot or leg-Call our office immediately to be seen.  This may be a sign of a limb or life threatening infection that will need prompt attention.  Marlowe Aschoff, DPM  [facility]    [phone] [facility]

## 2013-05-29 NOTE — Progress Notes (Addendum)
  Subjective: Patient presents today for routine nail care. Patient is a known diabetic and states that he has pain on his left heel, as well as pain on his right fifth toe. In addition to uncomfortable toenails with ambulation.  Objective: Vascular status continues to remain is intact with palpable pedal pulses DP and PT at 1+ out of 4 bilateral. Patient's toenails are thick and discolored and mycotic 1 through 5 bilateral. An ulceration is present on the medial aspect of the fifth digit right foot. It measures 3 mm in diameter and appears to be superficial with a fibro-granular base. Redness on the toe itself is also seen. No active pus, purulence, or draining is noted. There is also a hyperkeratotic lesion on the distal tip of the left hallux which reveals intact integument. It does however bleeds very easily upon debridement.  Assessment: 1) ulceration fifth toe right foot 2) neuropathy 3) mycotic toenails which are symptomatic 4) hyperkeratotic lesion left hallux  Plan: Debrided the ulceration with a #15 blade right foot. Iodosorb was applied as well as a dry sterile compressive dressing. The patient was given instructions for dressing changes and aftercare. The patient's toenails were also debrided without complication. A hyperkeratotic lesion at the distal tip of the left hallux was debrided and some iatrogenic bleeding was noted. A dressing was placed on this toe as well. A prescription for Keflex antibiotic was written as well as a prescription for amitriptyline.  He will be seen back in 2 weeks for followup.

## 2013-06-02 ENCOUNTER — Telehealth: Payer: Self-pay | Admitting: *Deleted

## 2013-06-02 NOTE — Telephone Encounter (Signed)
Request change of medication:  Iodosorb gel due to cost.  Dr Irving Shows discontinued Iodosorb gel and ordered Silvadene Cream to be applied to affected area daily with a sterile dressing.  Orders called to [phone] and faxed to [phone].

## 2013-06-12 ENCOUNTER — Encounter: Payer: Self-pay | Admitting: *Deleted

## 2013-06-12 ENCOUNTER — Telehealth: Payer: Self-pay | Admitting: *Deleted

## 2013-06-12 ENCOUNTER — Ambulatory Visit (INDEPENDENT_AMBULATORY_CARE_PROVIDER_SITE_OTHER): Payer: Medicare Other | Admitting: Podiatrist

## 2013-06-12 ENCOUNTER — Encounter: Payer: Self-pay | Admitting: Podiatrist

## 2013-06-12 VITALS — BP 101/72 | HR 78 | Temp 96.7°F | Resp 28

## 2013-06-12 DIAGNOSIS — L97411 Non-pressure chronic ulcer of right heel and midfoot limited to breakdown of skin: Secondary | ICD-10-CM

## 2013-06-12 DIAGNOSIS — L97409 Non-pressure chronic ulcer of unspecified heel and midfoot with unspecified severity: Secondary | ICD-10-CM

## 2013-06-12 NOTE — Progress Notes (Signed)
Subjective: Patient presents today for follow up ulcer right fifth toe. Dressing changes have been occurring at [facility].  Objective: Vascular status continues to remain is intact with palpable pedal pulses DP and PT at 1+ out of 4 bilateral. An improving ulceration is present on the medial aspect of the fifth digit right foot. It measures 3 mm in diameter and appears to be superficial with a fibro-granular base. Redness is resolved from the toe as opposed to last visit.  Assessment: 1) ulceration fifth toe right foot 2) neuropathy   Plan: Debrided the ulceration with a #15 blade right foot. Iodosorb was applied as well as a dry sterile compressive dressing. The patient was given instructions for dressing changes and aftercare.  He will be seen back in 3 weeks for followup.  Marlowe Aschoff, DPM

## 2013-06-12 NOTE — Telephone Encounter (Signed)
Tyler [name] from [facility] [phone] states that pt is allergic to Neosporin and Polysporin ointments.  Dr. Irving Shows ordered clean right heel ulcer once daily with saline and apply Bacitracin ointment and a gauze dressing.  Orders faxed to [phone].

## 2013-06-12 NOTE — Patient Instructions (Signed)
Instructions for Wound Care   Cleanse your foot with saline wash or warm soapy water (dial antibacterial soap or similar).  Blot dry.  Apply prescribed medication to your wound and cover with gauze and a bandage.  May hold bandage in place with Coban (self sticky wrap), Ace bandage or tape.  You may find dressing supplies at your local [employer], [employer], drug store or medical supply store.   If you notice any foul odor, increase in pain, pus, increased swelling, red streaks or generalized redness occurring in your foot or leg-Call our office immediately to be seen.  This may be a sign of a limb or life threatening infection that will need prompt attention.  Marlowe Aschoff, DPM  [facility]

## 2013-06-12 NOTE — Telephone Encounter (Signed)
Ms McWhite states pt is allergic to Neosporin and Polysporin ointments.  Dr. Irving Shows ordered clean right heel ulcer once daily with saline and apply Bacitracin ointment and a gauze dressing.  Orders faxed to [phone].

## 2013-06-16 ENCOUNTER — Other Ambulatory Visit: Payer: Medicare Other

## 2013-06-26 ENCOUNTER — Ambulatory Visit: Payer: Medicare Other | Admitting: Endocrinology

## 2013-06-26 ENCOUNTER — Ambulatory Visit (INDEPENDENT_AMBULATORY_CARE_PROVIDER_SITE_OTHER): Payer: Medicare Other | Admitting: Endocrinology

## 2013-06-26 ENCOUNTER — Encounter: Payer: Self-pay | Admitting: Endocrinology

## 2013-06-26 VITALS — BP 128/62 | HR 78 | Temp 98.3°F | Resp 12

## 2013-06-26 DIAGNOSIS — E119 Type 2 diabetes mellitus without complications: Secondary | ICD-10-CM

## 2013-06-26 LAB — BASIC METABOLIC PANEL
CO2: 30 mEq/L (ref 19–32)
Calcium: 8.8 mg/dL (ref 8.4–10.5)
Chloride: 102 mEq/L (ref 96–112)
Creatinine, Ser: 1.9 mg/dL — ABNORMAL HIGH (ref 0.4–1.5)
Sodium: 139 mEq/L (ref 135–145)

## 2013-06-26 LAB — HEMOGLOBIN A1C: Hgb A1c MFr Bld: 7.9 % — ABNORMAL HIGH (ref 4.6–6.5)

## 2013-06-26 NOTE — Patient Instructions (Signed)
See attached shhet

## 2013-06-26 NOTE — Progress Notes (Signed)
Patient ID: Tyler [name], male   DOB: [birth year], 79 y.o.   MRN: [id number]  Tyler [name] is an 79 y.o. male.   Reason for Appointment: Diabetes follow-up   History of Present Illness   Diagnosis: Type 2 DIABETES MELITUS, long-standing  He has been on insulin for several years with reasonably good control.  Blood sugars do fluctuate and are usually inconsistent for unknown reasons, probably from variability in his diet at the nursing home On his last visit his creatinine was 1.8 and his metformin was stopped RECENTLY his blood sugars appear to be much higher over the last 3 weeks or so for no apparent reason. He thinks this is from getting a foot infection. Review of his records over the last month show periodic high readings so over 300 although better on some days. Highest blood sugar 392 on 06/22/13  He thinks he is getting his mealtime insulin before meals consistently and is watching his diet as best he can He is generally having high readings in the morning, some at suppertime and often after supper     Oral hypoglycemic drugs: None       Side effects from medications: None Insulin regimen: NovoLog 10-6-AC, Lantus 18 a.m. and 35 in PM        Proper timing of medications in relation to meals: Yes.         Monitors blood glucose: Once a day.    Glucometer: One Touch.          Blood Glucose readings   PREMEAL Breakfast Lunch Dinner Bedtime Overall  Glucose range: 213-282 101-399 168-270 220-270   Mean/median:         Hypoglycemia: None       Meals: 3 meals per day.          Physical activity: exercise: Unable to do any            Wt Readings from Last 3 Encounters:  05/29/13 257 lb (116.574 kg)  06/10/12 268 lb 4.8 oz (121.7 kg)  06/10/12 268 lb 4.8 oz (121.7 kg)   Last eye exam 4/14  Diabetes labs:  Lab Results  Component Value Date   HGBA1C 7.9* 06/26/2013   HGBA1C 7.6* 03/20/2013   HGBA1C 6.7* 05/31/2012   Lab Results  Component Value Date   CREATININE  1.9* 06/26/2013       Medication List       This list is accurate as of: 06/26/13 11:59 PM.  Always use your most recent med list.               acetaminophen 500 MG tablet  Commonly known as:  TYLENOL  Take 1,000 mg by mouth every 6 (six) hours as needed. For severe headache     amitriptyline 25 MG tablet  Commonly known as:  ELAVIL  Take 1 tablet (25 mg total) by mouth at bedtime.     Azelastine-Fluticasone 137-50 MCG/ACT Susp  Place into the nose.     BEPREVE 1.5 % Soln  Generic drug:  Bepotastine Besilate  Place 1 drop into both eyes 2 (two) times daily.     cephALEXin 500 MG capsule  Commonly known as:  KEFLEX  Take 1 capsule (500 mg total) by mouth 3 (three) times daily.     cycloSPORINE 0.05 % ophthalmic emulsion  Commonly known as:  RESTASIS  Place 1 drop into both eyes 2 (two) times daily.     donepezil 10 MG tablet  Commonly known  as:  ARICEPT  Take 10 mg by mouth at bedtime.     doxazosin 2 MG tablet  Commonly known as:  CARDURA  Take 1 tablet (2 mg total) by mouth at bedtime.     famotidine 40 MG tablet  Commonly known as:  PEPCID  Take 40 mg by mouth at bedtime.     ferrous sulfate 325 (65 FE) MG tablet  Take 325 mg by mouth daily with breakfast.     fexofenadine 180 MG tablet  Commonly known as:  ALLEGRA  Take 180 mg by mouth daily as needed. For seasonal allergies     Fluticasone-Salmeterol 100-50 MCG/DOSE Aepb  Commonly known as:  ADVAIR  Inhale 1 puff into the lungs 2 (two) times daily. Rinse mouth and spit after each use     hydroxypropyl methylcellulose 2.5 % ophthalmic solution  Commonly known as:  ISOPTO TEARS  Place 1 drop into both eyes every 2 (two) hours as needed. For dry eyes     insulin aspart 100 UNIT/ML injection  Commonly known as:  novoLOG  Inject into the skin 3 (three) times daily before meals. Sliding scale 10-6-8     insulin glargine 100 UNIT/ML injection  Commonly known as:  LANTUS  Inject 18 Units into the skin  2 (two) times daily. 18 units in am and 35 units at bedtime     ipratropium 0.06 % nasal spray  Commonly known as:  ATROVENT  Place 2 sprays into the nose 3 (three) times daily.     metoCLOPramide 10 MG tablet  Commonly known as:  REGLAN  Take 0.5 tablets (5 mg total) by mouth at bedtime.     montelukast 10 MG tablet  Commonly known as:  SINGULAIR  Take 10 mg by mouth at bedtime. 1/2 tablet at bedtime     multivitamin with minerals Tabs tablet  Take 1 tablet by mouth every morning.     neomycin-bacitracin-polymyxin Oint  Commonly known as:  NEOSPORIN  Apply 1 application topically daily. To left big toe     pantoprazole 40 MG tablet  Commonly known as:  PROTONIX  Take 40 mg by mouth 2 (two) times daily.     sennosides-docusate sodium 8.6-50 MG tablet  Commonly known as:  SENOKOT-S  Take 2 tablets by mouth at bedtime as needed. For constipation     sertraline 25 MG tablet  Commonly known as:  ZOLOFT  Take 25 mg by mouth daily.     simvastatin 20 MG tablet  Commonly known as:  ZOCOR  Take 20 mg by mouth at bedtime.     sodium chloride 0.65 % Soln nasal spray  Commonly known as:  OCEAN  Place 1 spray into the nose 4 (four) times daily.     torsemide 100 MG tablet  Commonly known as:  DEMADEX  Take 50 mg by mouth daily.     trolamine salicylate 10 % cream  Commonly known as:  ASPERCREME  Apply 1 application topically 3 (three) times daily. To left shoulder     VENTOLIN HFA 108 (90 BASE) MCG/ACT inhaler  Generic drug:  albuterol  Inhale 2 puffs into the lungs every 4 (four) hours as needed. For shortness of breath     Vitamin D (Ergocalciferol) 50000 UNITS Caps capsule  Commonly known as:  DRISDOL  Take 50,000 Units by mouth every 7 (seven) days. Fridays     warfarin 5 MG tablet  Commonly known as:  COUMADIN  Take 7.5-10 mg by  mouth daily. Take 2 tablets (=10mg ) on Monday, Wednesday, and Friday.  Take 1.5 tablet (=7.5mg ) on Tuesday, Thursday, Saturday, and sunday         Allergies:  Allergies  Allergen Reactions  . Neosporin Original [Bacitracin-Neomycin-Polymyxin]     Pamela McWhite from [facility] [phone] states pt is allergic to Neosporin and Polysporin ointments.    Past Medical History  Diagnosis Date  . Diabetes mellitus 1980  . Hyperlipidemia 1980  . Hypertension 1980  . Asthma   . Allergic rhinitis   . GERD (gastroesophageal reflux disease)   . BPH (benign prostatic hypertrophy)     s/p thermo therapy, has bladdero utlet obst. and instablitiy, incontient  . Anemia   . Chronic constipation   . Sick sinus syndrome   . Paroxysmal atrial fibrillation     status post direct cardioversion- normal cardiac cath 2003, with normal left ventricular function (done after an abnormal cardiolite)  . Bradycardia     severe, on high doese AV-nodal blockers  . Bifascicular block     chronic  . Obesity   . Sleep apnea     noncomplicance w/ CPAP  . Hypoxia     in the past, likely multifactorial  . Gastroparesis     dx in 2006 through gastric scan  . Gynecomastia     nomral mammograms 11/2008  . Shortness of breath   . Chronic kidney disease   . Globus sensation     20 12 EGD, Ba swallow, both negative.  MBSS 2012 negative for dysphagia.     Past Surgical History  Procedure Laterality Date  . Cataract extraction      bilateral  . Rotator cuff repair      left  . Cardiac catheterization  2003    normal, left ventricular function (done after an abnormal cardiolite)  . Tumor removal      off arms  . Pacemaker insertion  06/07/12    SJM implanted by Dr [name] for symptomatic bradycardia    Family History  Problem Relation Age of Onset  . Colon cancer Neg Hx   . Prostate cancer Maternal Uncle   . Heart disease Paternal Uncle     x 3, paternal aunt    Social History:  reports that he quit smoking about 41 years ago. His smoking use included Cigarettes. He smoked 0.00 packs per day. He has quit using smokeless tobacco. His  smokeless tobacco use included Chew. He reports that he does not drink alcohol or use illicit drugs.  Review of Systems:  HYPERTENSION:  appears well controlled with current medications including doxazosin History of atrial fibrillation, on Coumadin  HYPERLIPIDEMIA: The lipid abnormality consists of elevated LDL.     Examination:   BP 128/62  Pulse 78  Temp(Src) 98.3 F (36.8 C)  Resp 12  SpO2 98%  Cannot calculate BMI with a height equal to zero.   ASSESSMENT/ PLAN::   Diabetes type 2   His blood sugars are significantly higher, possibly from stopping metformin but also he thinks this is from foot infection His blood sugars are high most of the time although somewhat better at times at lunch and supper when he gets correction doses of NovoLog. Since his fasting readings are also high will increase his Lantus by 5 units in the evening and 4 units in the morning Also will increase his dose at lunch and supper for mealtime coverage  LANTUS new doses: 22-40  NovoLog doses: 05-03-11  Marelly Wehrman 06/27/2013, 2:43 PM  Office Visit on 06/26/2013  Component Date Value Range Status  . Sodium 06/26/2013 139  135 - 145 mEq/L Final  . Potassium 06/26/2013 4.2  3.5 - 5.1 mEq/L Final  . Chloride 06/26/2013 102  96 - 112 mEq/L Final  . CO2 06/26/2013 30  19 - 32 mEq/L Final  . Glucose, Bld 06/26/2013 126* 70 - 99 mg/dL Final  . BUN 16/04/9603 30* 6 - 23 mg/dL Final  . Creatinine, Ser 06/26/2013 1.9* 0.4 - 1.5 mg/dL Final  . Calcium 54/03/8118 8.8  8.4 - 10.5 mg/dL Final  . GFR 14/78/2956 44.06* >60.00 mL/min Final  . Hemoglobin A1C 06/26/2013 7.9* 4.6 - 6.5 % Final   Glycemic Control Guidelines for People with Diabetes:Non Diabetic:  <6%Goal of Therapy: <7%Additional Action Suggested:  >8%

## 2013-07-02 ENCOUNTER — Encounter: Payer: Self-pay | Admitting: *Deleted

## 2013-07-03 ENCOUNTER — Ambulatory Visit (INDEPENDENT_AMBULATORY_CARE_PROVIDER_SITE_OTHER): Payer: Medicare Other | Admitting: Podiatrist

## 2013-07-03 ENCOUNTER — Encounter: Payer: Self-pay | Admitting: Podiatrist

## 2013-07-03 VITALS — BP 149/75 | HR 61 | Temp 96.0°F | Resp 20 | Ht 70.5 in | Wt 257.0 lb

## 2013-07-03 DIAGNOSIS — L97511 Non-pressure chronic ulcer of other part of right foot limited to breakdown of skin: Secondary | ICD-10-CM

## 2013-07-03 DIAGNOSIS — L97509 Non-pressure chronic ulcer of other part of unspecified foot with unspecified severity: Secondary | ICD-10-CM

## 2013-07-03 NOTE — Progress Notes (Addendum)
    Subjective: Patient presents today for follow up ulcer right fifth toe on the medial aspect.. He states it doesn't hurt anymore and relates that continued Dressing changes have been occurring at [facility] as I instructed.   Objective: Vascular status continues to remain is intact with palpable pedal pulses DP and PT at 1+ out of 4 bilateral. A significantly improved and almost healed ulceration is present on the medial aspect of the fifth digit right foot. It measures 3 mm in diameter and appears to be superficial with a fibrous base. No redness, no swelling, no streaking, no sign of infection present. A superficial callus on the distal tip of the left hallux is noted which is stable. Of note in the past this area and bleeds quite easily with debridement therefore no debridement was carried out today.  Assessment: 1) ulceration fifth toe right foot medial side 2) neuropathy   Plan: Minimal debridement to the ulceration with a #15 blade right foot. Iodosorb was applied as well as a dry sterile compressive dressing. The patient was given instructions for dressing changes and aftercare. Consisting of daily dressing changes with a 2 x 2 and paper tape. This should go on to heal uneventfully. If he has any problems he will call. Otherwise he'll be seen back for his routine care appointment in 60 days.    Marlowe Aschoff, DPM

## 2013-07-30 ENCOUNTER — Ambulatory Visit: Payer: Medicare Other | Admitting: Podiatrist

## 2013-07-31 ENCOUNTER — Encounter: Payer: Self-pay | Admitting: Podiatrist

## 2013-07-31 ENCOUNTER — Ambulatory Visit (INDEPENDENT_AMBULATORY_CARE_PROVIDER_SITE_OTHER): Payer: Medicare Other | Admitting: Podiatrist

## 2013-07-31 VITALS — BP 117/80 | HR 86 | Resp 12

## 2013-07-31 DIAGNOSIS — E1149 Type 2 diabetes mellitus with other diabetic neurological complication: Secondary | ICD-10-CM

## 2013-07-31 DIAGNOSIS — L97409 Non-pressure chronic ulcer of unspecified heel and midfoot with unspecified severity: Secondary | ICD-10-CM

## 2013-07-31 DIAGNOSIS — L02619 Cutaneous abscess of unspecified foot: Secondary | ICD-10-CM

## 2013-07-31 DIAGNOSIS — L03119 Cellulitis of unspecified part of limb: Secondary | ICD-10-CM

## 2013-07-31 DIAGNOSIS — L97411 Non-pressure chronic ulcer of right heel and midfoot limited to breakdown of skin: Secondary | ICD-10-CM

## 2013-07-31 DIAGNOSIS — E1140 Type 2 diabetes mellitus with diabetic neuropathy, unspecified: Secondary | ICD-10-CM

## 2013-07-31 DIAGNOSIS — E1142 Type 2 diabetes mellitus with diabetic polyneuropathy: Secondary | ICD-10-CM

## 2013-07-31 MED ORDER — CEPHALEXIN 500 MG PO CAPS: 500.0000 mg | ORAL_CAPSULE | Freq: Three times a day (TID) | ORAL | Status: DC

## 2013-07-31 NOTE — Progress Notes (Signed)
  Subjective: Patient presents today for follow up ulcer right fifth toe on the medial aspect.. He states it hurts some and relates new pain on the left heel.   Objective: Vascular status continues to remain is intact with palpable pedal pulses DP and PT at 1+ out of 4 bilateral. A significantly improved and almost healed ulceration is present on the medial aspect of the fifth digit right foot. It measures 2 mm in diameter and appears to be superficial with a fibrous base. No redness, no swelling, no streaking, no sign of infection present. A superficial callus on the distal tip of the left hallux is noted which is stable. Of note in the past this area and bleeds quite easily with debridement therefore no debridement was carried out today. Sorness, calor, and redness present to the left posterior heel which is a new finding   Assessment: 1) ulceration fifth toe right foot medial side 2) sore left heel/cellulitus  Plan: Minimal debridement to the ulceration on the right fifth toe with a #15 blade. Iodosorb was applied as well as a dry sterile compressive dressing. The patient was given instructions for dressing changes and aftercare. An oral antibiotic of keflex was written for the heel cellulitus.  I will see Mr. Yetta FlockHodges  Back for a recheck and he will call if problems arise.  An order for dressing changes and epsom salt soaks was also written. Marlowe AschoffKathryn Alaney Witter, DPM

## 2013-08-04 ENCOUNTER — Telehealth: Payer: Self-pay | Admitting: *Deleted

## 2013-08-04 NOTE — Telephone Encounter (Signed)
Dr [name] ordered discontinue Silvadine cream dressing to the area that has healed.  Faxed.

## 2013-08-07 ENCOUNTER — Ambulatory Visit: Payer: Medicare Other | Admitting: Endocrinology

## 2013-08-07 DIAGNOSIS — Z0289 Encounter for other administrative examinations: Secondary | ICD-10-CM

## 2013-08-08 ENCOUNTER — Encounter: Payer: Self-pay | Admitting: Internal Medicine

## 2013-08-14 ENCOUNTER — Encounter: Payer: Self-pay | Admitting: Podiatrist

## 2013-08-14 ENCOUNTER — Ambulatory Visit (INDEPENDENT_AMBULATORY_CARE_PROVIDER_SITE_OTHER): Payer: Medicare Other | Admitting: Podiatrist

## 2013-08-14 VITALS — BP 135/69 | HR 86 | Resp 12

## 2013-08-14 DIAGNOSIS — L03119 Cellulitis of unspecified part of limb: Secondary | ICD-10-CM

## 2013-08-14 DIAGNOSIS — M25472 Effusion, left ankle: Secondary | ICD-10-CM

## 2013-08-14 DIAGNOSIS — L02619 Cutaneous abscess of unspecified foot: Secondary | ICD-10-CM

## 2013-08-14 DIAGNOSIS — M25476 Effusion, unspecified foot: Secondary | ICD-10-CM

## 2013-08-14 DIAGNOSIS — M7750 Other enthesopathy of unspecified foot: Secondary | ICD-10-CM

## 2013-08-14 DIAGNOSIS — M19079 Primary osteoarthritis, unspecified ankle and foot: Secondary | ICD-10-CM

## 2013-08-14 DIAGNOSIS — M25473 Effusion, unspecified ankle: Secondary | ICD-10-CM

## 2013-08-14 MED ORDER — CEPHALEXIN 500 MG PO CAPS: 500.0000 mg | ORAL_CAPSULE | Freq: Four times a day (QID) | ORAL | Status: DC

## 2013-08-14 NOTE — Patient Instructions (Signed)
See written instructions

## 2013-08-14 NOTE — Progress Notes (Signed)
Patient presents today with a new complaint of ankle pain bilateral. He also states he has pain in his left heel. He states that the baby toe on the right foot is healing well and is not having any problems from this area of ulceration. No bandages have been applied lately.  Objective: Faintly palpable pedal pulses continued to be present bilateral. Swelling on the left foot in comparison with the right is seen. Pain along the sinus tarsi region of the right ankle is noted. No redness or swelling on the right foot is seen. Swelling of the left foot is seen redness and calor the left heel is noted. Pain of the sinus tarsi region of the left foot is noted. Ulceration on the medial aspect of fifth digit is also improved of the right foot.  Assessment: Left heel pressure irritation and pre-ulceration, left foot swelling and cellulitis., Sinus tarsi irritation right ankle  Plan: Recommended offloading boots to the left heel to keep the pressure off of the left heel. Order was written for this. A dry sterile compressive dressing is to be applied to the left heel order was written. Patient was placed on Keflex antibiotics for the swelling and redness of the left foot. Order was written. An injection of Kenalog and Marcaine mixture was infiltrated into the sinus tarsi region of the right ankle. The patient will be seen back in one week for recheck. He is interested in new diabetic shoes and is due in January. We will address this at the next visit.

## 2013-08-20 ENCOUNTER — Telehealth: Payer: Self-pay | Admitting: *Deleted

## 2013-08-20 NOTE — Telephone Encounter (Signed)
[facility]  Dr [name] states can stop now.  Orders to [phone].

## 2013-08-28 ENCOUNTER — Encounter: Payer: Self-pay | Admitting: Podiatrist

## 2013-08-28 ENCOUNTER — Ambulatory Visit (INDEPENDENT_AMBULATORY_CARE_PROVIDER_SITE_OTHER): Payer: Medicare Other | Admitting: Podiatrist

## 2013-08-28 VITALS — BP 142/76 | HR 86 | Resp 12

## 2013-08-28 DIAGNOSIS — L97409 Non-pressure chronic ulcer of unspecified heel and midfoot with unspecified severity: Secondary | ICD-10-CM

## 2013-08-28 NOTE — Progress Notes (Signed)
  Patient presents today stating "my left foot is doing much better" he's been wearing his heel cradles and keeping pressure off of his left heel. He also has dry skin on his heel which is starting to come off on its own. He states that the baby toe on the right foot is still giving him trouble and is sore. No bandages have been applied to the baby toe right. A dry sterile compressive dressing has been applied to the left heel  Objective: Faintly palpable pedal pulses continued to be present bilateral. Improved Swelling on the left foot in comparison with the right is seen. No redness or calor the left heel noted at today's visit which is an improvement from his last visit.Marland Kitchen. Ulceration on the medial aspect of fifth digit is also improved of the right foot. Macerated tissue is present fourth interspace right   Assessment: Left heel pressure irritation and pre-ulceration, interdigital maceration fourth interspace right   Plan: I debrided the loosening hyperkeratotic skin on the plantar aspect of the left heel to reveal intact integument.   Recommended continued offloading boots to keep the pressure off of the left heel. Order was written for this. Aquaphor and A dry sterile compressive dressing is to be applied to the left heel order was written. A 2 x 2 is to be placed in the fourth interspace of the right foot to  pad the area and decrease macerated tissue. The patient will be seen back in 2 weeks for recheck. He is interested in new diabetic shoes and is due in January. We will address this at the next visit.or when the heel is healthy enough for new shoes.

## 2013-09-04 ENCOUNTER — Ambulatory Visit: Payer: Medicare Other | Admitting: Podiatrist

## 2013-09-11 ENCOUNTER — Ambulatory Visit (INDEPENDENT_AMBULATORY_CARE_PROVIDER_SITE_OTHER): Payer: Medicare Other | Admitting: Podiatrist

## 2013-09-11 VITALS — BP 119/73 | HR 116 | Resp 16 | Ht 70.0 in | Wt 263.0 lb

## 2013-09-11 DIAGNOSIS — E1149 Type 2 diabetes mellitus with other diabetic neurological complication: Secondary | ICD-10-CM

## 2013-09-11 DIAGNOSIS — L89899 Pressure ulcer of other site, unspecified stage: Secondary | ICD-10-CM

## 2013-09-11 DIAGNOSIS — L89891 Pressure ulcer of other site, stage 1: Secondary | ICD-10-CM

## 2013-09-11 DIAGNOSIS — M79609 Pain in unspecified limb: Secondary | ICD-10-CM

## 2013-09-11 DIAGNOSIS — B351 Tinea unguium: Secondary | ICD-10-CM

## 2013-09-11 DIAGNOSIS — E1140 Type 2 diabetes mellitus with diabetic neuropathy, unspecified: Secondary | ICD-10-CM

## 2013-09-11 NOTE — Progress Notes (Signed)
   Patient presents today stating "my left foot is doing much better" he's been wearing his heel cradles and keeping pressure off of his left heel. He also has dry skin on his heel which is starting to come off on its own. He states that the baby toe on the right foot is no longer sore. No bandages have been applied 5th digit right. A dry sterile compressive dressing has been applied to the left heel   Objective: 2/4 dp and pt palpable pedal pulses are present bilateral. Continued swelling on the left foot in comparison with the right is seen. No redness or calor the left heel noted at today's visit which is an improvement from his last visit.Marland Kitchen. Ulceration on the medial aspect of fifth digit is healed on the right foot. Macerated tissue is present fourth interspace bilateral.  Dry. Peeling skin is present left heel-- intact integument noted.  All nails are thickened, discolored, and mycotic  Assessment: Left heel pressure irritation and pre-ulceration, interdigital maceration fourth interspace bilatersl,  Pre-ulcerative lesion left hallux tip, symptomatic mytcotic toenails.   Plan: I debrided the loosening hyperkeratotic skin on the plantar aspect of the left heel and left hallux tip to reveal intact integument. Recommended continued offloading boots to keep the pressure off of the left heel. Order was written for this. Aquaphor or other barrier cream/ointment and A dry sterile compressive dressing is to be applied to the left heel order was written. A 2 x 2 is to be placed in the fourth interspace of the right foot to pad the area and decrease macerated tissue. The patient will be seen back in 2 weeks for recheck. We measured the patient for new diabetic shoes.  These would be preventative in the future for further ulceration and risk for infection.

## 2013-10-09 ENCOUNTER — Ambulatory Visit (INDEPENDENT_AMBULATORY_CARE_PROVIDER_SITE_OTHER): Payer: Medicare Other | Admitting: Podiatrist

## 2013-10-09 ENCOUNTER — Encounter: Payer: Self-pay | Admitting: Podiatrist

## 2013-10-09 VITALS — BP 122/69 | HR 102 | Resp 12

## 2013-10-09 DIAGNOSIS — L97409 Non-pressure chronic ulcer of unspecified heel and midfoot with unspecified severity: Secondary | ICD-10-CM

## 2013-10-09 MED ORDER — SILVER SULFADIAZINE 1 % EX CREA: 1.0000 "application " | TOPICAL_CREAM | Freq: Every day | CUTANEOUS | Status: DC

## 2013-10-09 NOTE — Progress Notes (Signed)
   Patient presents today stating "my left foot is still swollen" he's been wearing his heel cradles and keeping pressure off of his left heel. He also has dry skin on his heel which is starting to come off on its own. He states he's noticed a little bit of drainage on the bandage weight change.   Objective: 2/4 dp and pt palpable pedal pulses are present bilateral. Continued swelling on the left foot in comparison with the right is seen. No redness or calor the left heel noted at today's visit  continued Dry, Peeling skin is present left heel-- very small ulceration measuring 2 mm in diameter present and does not appear to be infected.   Assessment: Left heel pressure irritation and ulceration,Pre-ulcerative lesion left hallux tip  Plan: I debrided the loosening hyperkeratotic skin on the plantar aspect of the left heel and applied Iodosorb and a dry sterile compressive dressing.. Recommended continued offloading boots to keep the pressure off of the left heel. Order was written for Silvadene cream/ointment and A dry sterile compressive dressing is to be applied to the left heel  The patient will be seen back in 3 weeks for recheck. Diabetic shoes should be back at the next visit

## 2013-10-09 NOTE — Patient Instructions (Signed)
Instructions for Wound Care  The most important step to healing a foot wound is to reduce the pressure on your foot - it is extremely important to stay off your foot as much as possible and wear the shoe/boot as instructed.  Cleanse your foot with saline wash or warm soapy water (dial antibacterial soap or similar).  Blot dry.  Apply prescribed medication to your wound and cover with gauze and a bandage.  May hold bandage in place with Coban (self sticky wrap), Ace bandage or tape.  You may find dressing supplies at your local [employer], [employer], drug store or medical supply store.  I have called in Silvadene cream to your pharmacy-- start using this on your heel and covering with a bandage daily

## 2013-10-30 ENCOUNTER — Encounter: Payer: Self-pay | Admitting: Podiatrist

## 2013-10-30 ENCOUNTER — Ambulatory Visit (INDEPENDENT_AMBULATORY_CARE_PROVIDER_SITE_OTHER): Payer: Medicare Other | Admitting: Podiatrist

## 2013-10-30 VITALS — BP 124/72 | HR 100 | Resp 12

## 2013-10-30 DIAGNOSIS — E1149 Type 2 diabetes mellitus with other diabetic neurological complication: Secondary | ICD-10-CM

## 2013-10-30 DIAGNOSIS — E1140 Type 2 diabetes mellitus with diabetic neuropathy, unspecified: Secondary | ICD-10-CM

## 2013-10-30 DIAGNOSIS — L89899 Pressure ulcer of other site, unspecified stage: Secondary | ICD-10-CM

## 2013-10-30 DIAGNOSIS — E1142 Type 2 diabetes mellitus with diabetic polyneuropathy: Secondary | ICD-10-CM

## 2013-10-30 DIAGNOSIS — L97409 Non-pressure chronic ulcer of unspecified heel and midfoot with unspecified severity: Secondary | ICD-10-CM

## 2013-10-30 DIAGNOSIS — L89891 Pressure ulcer of other site, stage 1: Secondary | ICD-10-CM

## 2013-10-30 DIAGNOSIS — L8991 Pressure ulcer of unspecified site, stage 1: Secondary | ICD-10-CM

## 2013-10-30 MED ORDER — CEPHALEXIN 500 MG PO CAPS: 500.0000 mg | ORAL_CAPSULE | Freq: Three times a day (TID) | ORAL | Status: DC

## 2013-10-30 MED ORDER — PREVALON HEEL PROTECTOR MISC: Status: AC

## 2013-10-30 NOTE — Progress Notes (Signed)
Patient presents today stating "my left foot is still swollen" he's been wearing his heel cradles and keeping pressure off of his left heel. The heel cradles are evaluated and appear to be worn thin.  He also has dry skin on his heel which is starting to come off on its own.   Objective: 2/4 dp and pt palpable pedal pulses are present bilateral. Continued swelling on the left foot in comparison with the right is seen. No redness or calor the left heel noted at today's visit continued Dry, Peeling skin is present left heel-- very small ulceration measuring 2 mm in diameter present and does not appear to be infected.   Assessment: Left heel pressure irritation and ulceration,Pre-ulcerative lesion left hallux tip   Plan: I debrided the loosening hyperkeratotic skin on the plantar aspect of the left heel and applied Duoderm. Recommended continued offloading boots and wrote a Rx for the Prevalon boots to keep the pressure off of the left heel. Order was written for Duoderm and offloading booties is to be applied to the left heel The patient will be seen back in 2 weeks for recheck. Diabetic shoes should be back at the next visit

## 2013-10-30 NOTE — Patient Instructions (Signed)
Dressing Orders-- apply duoderm to the foot every 3rd day.  Wear the new Prevalon boot daily and take pressure off your foot.

## 2013-10-31 ENCOUNTER — Telehealth: Payer: Self-pay | Admitting: *Deleted

## 2013-10-31 NOTE — Telephone Encounter (Signed)
I called a prescription into the pharmacy for Cephalexin 500 mg, Sig: one TID, Qty: 30, 0 refills.

## 2013-10-31 NOTE — Telephone Encounter (Signed)
Call us back.  He saw you yesterday.  Can we change the order of the Duoderm and the Epsom Salt soaks to every third day?  I informed them that Dr. [name] said that is fine.

## 2013-11-13 ENCOUNTER — Ambulatory Visit (INDEPENDENT_AMBULATORY_CARE_PROVIDER_SITE_OTHER): Payer: Medicare Other | Admitting: Podiatrist

## 2013-11-13 ENCOUNTER — Ambulatory Visit: Payer: Medicare Other | Admitting: Podiatrist

## 2013-11-13 ENCOUNTER — Encounter: Payer: Self-pay | Admitting: Podiatrist

## 2013-11-13 VITALS — BP 135/84 | HR 100 | Resp 12

## 2013-11-13 DIAGNOSIS — L97409 Non-pressure chronic ulcer of unspecified heel and midfoot with unspecified severity: Secondary | ICD-10-CM

## 2013-11-13 DIAGNOSIS — L89891 Pressure ulcer of other site, stage 1: Secondary | ICD-10-CM

## 2013-11-13 DIAGNOSIS — M7750 Other enthesopathy of unspecified foot: Secondary | ICD-10-CM

## 2013-11-13 DIAGNOSIS — E1149 Type 2 diabetes mellitus with other diabetic neurological complication: Secondary | ICD-10-CM

## 2013-11-13 DIAGNOSIS — L8991 Pressure ulcer of unspecified site, stage 1: Secondary | ICD-10-CM

## 2013-11-13 DIAGNOSIS — L89899 Pressure ulcer of other site, unspecified stage: Secondary | ICD-10-CM

## 2013-11-13 DIAGNOSIS — E1142 Type 2 diabetes mellitus with diabetic polyneuropathy: Secondary | ICD-10-CM

## 2013-11-13 DIAGNOSIS — E1140 Type 2 diabetes mellitus with diabetic neuropathy, unspecified: Secondary | ICD-10-CM

## 2013-11-13 MED ORDER — TRIAMCINOLONE ACETONIDE 10 MG/ML IJ SUSP
10.0000 mg | Freq: Once | INTRAMUSCULAR | Status: AC
  Administered 2013-11-13: 10 mg

## 2013-11-13 NOTE — Progress Notes (Signed)
   Subjective:    Patient ID: Mila PalmerHodgest L Housel, male    DOB: Jan 25, 1934, 80 y.o.   MRN: [id number]  HPI Patient presents for foot and ulcer check and to pick up diabetic shoes.  He also has new pain on the right ankle.   Review of Systems     Objective:   Physical Exam Neurovascular status unchanged with palpable pulses present bilateral.  Swelling has resolved to the left foot.  Left heel ulcer appears improved with duoderm.  Peeling skin is present and a slight amount is debrided off.  He has a new problem on his right ankle where there is pain. He has a small superficial ulcer on the lateral fibula.  It appears superficial, with a red granular base.  There is pain at the sinus tarsi region with inflammation present as well.         Assessment & Plan:  Assessment:  New ulcer lateral right ankle; capsulitis- sinus tarsi right; improving left heel ulcer  Plan:  Recommended use of another soft boot to protect the lateral right ankle.  i applied iodosorb and dressings to the ulcers and instructed the patiient on continued use of duoderm.  I injected the right ankle at the sinus tarsi region. I will see him back in 3 weeks. At that time it will be time for a routine nail trim as well.  We will hold on to his diabetic shoes until he is able to wear them

## 2013-11-14 ENCOUNTER — Telehealth: Payer: Self-pay | Admitting: *Deleted

## 2013-11-14 NOTE — Telephone Encounter (Signed)
Dr.Egerton ordered Duoderm to Right ankle, and left heel and heel pillow shields to both feet at all time.  Faxed to [phone].

## 2013-11-24 ENCOUNTER — Telehealth: Payer: Self-pay | Admitting: *Deleted

## 2013-11-24 NOTE — Telephone Encounter (Signed)
Need an order for home health for foot care, needs to be mentioned in between visits.  Please sign and fax back.  Dr. [name] signed the order.

## 2013-12-11 ENCOUNTER — Ambulatory Visit: Payer: Medicare Other | Admitting: Podiatrist

## 2013-12-18 ENCOUNTER — Ambulatory Visit: Payer: Medicare Other | Admitting: Podiatrist

## 2013-12-18 ENCOUNTER — Ambulatory Visit (INDEPENDENT_AMBULATORY_CARE_PROVIDER_SITE_OTHER): Payer: Medicare Other | Admitting: Podiatry

## 2013-12-18 VITALS — BP 139/81 | HR 112 | Temp 96.3°F | Resp 18 | Ht 70.5 in | Wt 266.0 lb

## 2013-12-18 DIAGNOSIS — B351 Tinea unguium: Secondary | ICD-10-CM

## 2013-12-18 DIAGNOSIS — M79609 Pain in unspecified limb: Secondary | ICD-10-CM

## 2013-12-18 DIAGNOSIS — L97509 Non-pressure chronic ulcer of other part of unspecified foot with unspecified severity: Secondary | ICD-10-CM

## 2013-12-18 NOTE — Progress Notes (Signed)
Subjective:     Patient ID: Tyler [name], male   DOB: 30-Jul-1933, 80 y.o.   MRN: [id number]  HPI patient presents stating I think the ulcers are doing well I need my nails cut and I know my diabetic shoes are here   Review of Systems     Objective:   Physical Exam Neurovascular status unchanged with healing area on the left heel and right ankle that did not show any signs of breakdown currently with no drainage noted in nail disease with thickness 1-5 both feet that are brittle yellow and painful    Assessment:     Healing well ulcerations with duties and medication and mycotic nail infection with pain 1-5 both feet    Plan:     Debridement painful nail bed 1-5 both feet and patient will have diabetic shoes dispensed by Dr. Donzetta Kohut at next visit

## 2013-12-19 ENCOUNTER — Telehealth: Payer: Self-pay | Admitting: *Deleted

## 2013-12-19 NOTE — Telephone Encounter (Signed)
We got 3 different appointments for him.  Calling to verify.  I called and left a message that we have him scheduled for an appointment on 01/01/2014 at 1:15pm.

## 2013-12-22 ENCOUNTER — Encounter: Payer: Self-pay | Admitting: Internal Medicine

## 2013-12-31 ENCOUNTER — Encounter: Payer: Medicare Other | Admitting: Internal Medicine

## 2014-01-01 ENCOUNTER — Encounter: Payer: Self-pay | Admitting: Podiatrist

## 2014-01-01 ENCOUNTER — Ambulatory Visit (INDEPENDENT_AMBULATORY_CARE_PROVIDER_SITE_OTHER): Payer: Medicare Other | Admitting: Podiatrist

## 2014-01-01 VITALS — BP 111/64 | HR 100 | Resp 12

## 2014-01-01 DIAGNOSIS — M19079 Primary osteoarthritis, unspecified ankle and foot: Secondary | ICD-10-CM

## 2014-01-01 DIAGNOSIS — L97509 Non-pressure chronic ulcer of other part of unspecified foot with unspecified severity: Secondary | ICD-10-CM

## 2014-01-01 DIAGNOSIS — E1149 Type 2 diabetes mellitus with other diabetic neurological complication: Secondary | ICD-10-CM

## 2014-01-01 NOTE — Progress Notes (Signed)
HPI  Patient presents for foot and ulcer check Review of Systems  Objective:   Physical Exam  Neurovascular status unchanged with palpable pulses present bilateral. Swelling has resolved to the left foot. Left heel ulcer appears improved  He has a small superficial ulcer on the lateral fibula also appears to be healing well.    Assessment & Plan:   Assessment: healing ulcer lateral right ankle;  improving left heel ulcer  Plan:  I applied iodosorb and dressings to the ulcers and instructed the patiient on continued use of duoderm.  I will see him back in 3 weeks.

## 2014-01-01 NOTE — Patient Instructions (Signed)
Your ulcers look healed!  Orders today:  Soak both feet in epsom salt soaks (as below)twice daily.  Apply duoderm only to the left heel for protection  Do both of these for the next 2 weeks then may discontinue.  Soak Instructions    Place 1/4 cup of epsom salts in a quart of warm tap water.  Submerge your foot or feet continue to soak in the solution for 20 minutes.  This soak should be done twice a day.  Next, remove your foot or feet from solution, blot dry the affected area and cover the left heel with duoderm.

## 2014-01-02 ENCOUNTER — Encounter: Payer: Self-pay | Admitting: Internal Medicine

## 2014-01-14 ENCOUNTER — Encounter: Payer: Medicare Other | Admitting: Internal Medicine

## 2014-01-22 ENCOUNTER — Encounter: Payer: Self-pay | Admitting: Podiatrist

## 2014-01-22 ENCOUNTER — Ambulatory Visit: Payer: Medicare Other | Admitting: Podiatrist

## 2014-01-22 ENCOUNTER — Encounter: Payer: Self-pay | Admitting: Internal Medicine

## 2014-01-22 ENCOUNTER — Ambulatory Visit (INDEPENDENT_AMBULATORY_CARE_PROVIDER_SITE_OTHER): Payer: Medicare Other | Admitting: *Deleted

## 2014-01-22 ENCOUNTER — Ambulatory Visit (INDEPENDENT_AMBULATORY_CARE_PROVIDER_SITE_OTHER): Payer: Medicare Other | Admitting: Podiatrist

## 2014-01-22 DIAGNOSIS — I498 Other specified cardiac arrhythmias: Secondary | ICD-10-CM

## 2014-01-22 DIAGNOSIS — I480 Paroxysmal atrial fibrillation: Secondary | ICD-10-CM

## 2014-01-22 DIAGNOSIS — I4891 Unspecified atrial fibrillation: Secondary | ICD-10-CM

## 2014-01-22 DIAGNOSIS — M79609 Pain in unspecified limb: Secondary | ICD-10-CM

## 2014-01-22 DIAGNOSIS — B351 Tinea unguium: Secondary | ICD-10-CM

## 2014-01-22 DIAGNOSIS — L97409 Non-pressure chronic ulcer of unspecified heel and midfoot with unspecified severity: Secondary | ICD-10-CM

## 2014-01-22 DIAGNOSIS — M79673 Pain in unspecified foot: Secondary | ICD-10-CM

## 2014-01-22 DIAGNOSIS — R001 Bradycardia, unspecified: Secondary | ICD-10-CM

## 2014-01-22 DIAGNOSIS — L97429 Non-pressure chronic ulcer of left heel and midfoot with unspecified severity: Secondary | ICD-10-CM

## 2014-01-22 LAB — MDC_IDC_ENUM_SESS_TYPE_REMOTE
Battery Remaining Longevity: 82 mo
Battery Remaining Percentage: 67 %
Battery Voltage: 2.93 V
Brady Statistic AS VS Percent: 91 %
Date Time Interrogation Session: 20150702065313
Implantable Pulse Generator Model: 2210
Implantable Pulse Generator Serial Number: 7411677
Lead Channel Impedance Value: 510 Ohm
Lead Channel Pacing Threshold Amplitude: 0.5 V
Lead Channel Pacing Threshold Pulse Width: 0.5 ms
Lead Channel Sensing Intrinsic Amplitude: 2.9 mV
Lead Channel Setting Pacing Amplitude: 2 V
Lead Channel Setting Pacing Pulse Width: 0.5 ms
MDC IDC MSMT LEADCHNL RA PACING THRESHOLD AMPLITUDE: 0.5 V
MDC IDC MSMT LEADCHNL RA PACING THRESHOLD PULSEWIDTH: 0.5 ms
MDC IDC MSMT LEADCHNL RV IMPEDANCE VALUE: 580 Ohm
MDC IDC MSMT LEADCHNL RV SENSING INTR AMPL: 9.1 mV
MDC IDC SET LEADCHNL RV PACING AMPLITUDE: 2.5 V
MDC IDC SET LEADCHNL RV SENSING SENSITIVITY: 2 mV
MDC IDC STAT BRADY AP VP PERCENT: 1 %
MDC IDC STAT BRADY AP VS PERCENT: 6.4 %
MDC IDC STAT BRADY AS VP PERCENT: 1.8 %
MDC IDC STAT BRADY RA PERCENT PACED: 4.4 %
MDC IDC STAT BRADY RV PERCENT PACED: 1.6 %

## 2014-01-22 NOTE — Progress Notes (Signed)
   Subjective: Patient presents today for followup of foot and nail care. The left foot swelling has teased decreased significantly. The heel is still sore but no active ulceration is present. The ulceration that was present on the right ankle is also healed as well. Patient complains of pain at the tip of the left  great toe from a lesion present. Patient's toenails are also painful and symptomatic.  Objective: Pedal pulses continue to be palpable DP and PT at 1/4 bilateral. Temperature is warm to warm. Patient's toenails are elongated, thickened and mycotic. The left heel appears to be macerated due to the use of the patches. Distal tip of the left hallux has a hyperkeratotic lesion present it does not appear to be infected.  Assessment: Symptomatic mycotic toenails, healing pressure sore left heel, healed ulcer right ankle  Plan: Recommended discontinuation of the duoderm to the heel.  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 Debrided the toenails without complication he'll be seen in 3 months or sooner if questions or concerns arise..Marland Kitchen

## 2014-01-22 NOTE — Progress Notes (Signed)
Remote pacemaker transmission.   

## 2014-01-27 ENCOUNTER — Telehealth: Payer: Self-pay | Admitting: *Deleted

## 2014-01-27 NOTE — Telephone Encounter (Signed)
He got some diabetic shoes from y'all.  One of them is tearing up.  He wants to know if you all can fix it or replace it.

## 2014-01-27 NOTE — Telephone Encounter (Signed)
He's a patient of y'alls.  Y'all gave him some black diabetic shoes.  They're coming apart at the seams.  Please give me a call.  Thank You!

## 2014-02-16 ENCOUNTER — Encounter: Payer: Self-pay | Admitting: Internal Medicine

## 2014-02-16 ENCOUNTER — Ambulatory Visit (INDEPENDENT_AMBULATORY_CARE_PROVIDER_SITE_OTHER): Payer: Medicare Other | Admitting: Internal Medicine

## 2014-02-16 VITALS — BP 131/79 | HR 113 | Ht 70.5 in | Wt 267.8 lb

## 2014-02-16 DIAGNOSIS — I4891 Unspecified atrial fibrillation: Secondary | ICD-10-CM

## 2014-02-16 DIAGNOSIS — I452 Bifascicular block: Secondary | ICD-10-CM

## 2014-02-16 DIAGNOSIS — I4892 Unspecified atrial flutter: Secondary | ICD-10-CM

## 2014-02-16 DIAGNOSIS — Z950 Presence of cardiac pacemaker: Secondary | ICD-10-CM

## 2014-02-16 DIAGNOSIS — I10 Essential (primary) hypertension: Secondary | ICD-10-CM

## 2014-02-16 DIAGNOSIS — I480 Paroxysmal atrial fibrillation: Secondary | ICD-10-CM

## 2014-02-16 LAB — MDC_IDC_ENUM_SESS_TYPE_INCLINIC
Battery Remaining Longevity: 111.6 mo
Battery Voltage: 2.93 V
Brady Statistic RA Percent Paced: 4.1 %
Brady Statistic RV Percent Paced: 1.6 %
Lead Channel Impedance Value: 475 Ohm
Lead Channel Impedance Value: 575 Ohm
Lead Channel Pacing Threshold Amplitude: 0.5 V
Lead Channel Pacing Threshold Amplitude: 0.5 V
Lead Channel Sensing Intrinsic Amplitude: 10.4 mV
Lead Channel Sensing Intrinsic Amplitude: 2.6 mV
Lead Channel Setting Pacing Amplitude: 2 V
Lead Channel Setting Pacing Pulse Width: 0.5 ms
MDC IDC MSMT LEADCHNL RV PACING THRESHOLD PULSEWIDTH: 0.5 ms
MDC IDC MSMT LEADCHNL RV PACING THRESHOLD PULSEWIDTH: 0.5 ms
MDC IDC PG SERIAL: 7411677
MDC IDC SESS DTM: 20150727151043
MDC IDC SET LEADCHNL RV PACING AMPLITUDE: 2.5 V
MDC IDC SET LEADCHNL RV SENSING SENSITIVITY: 2 mV

## 2014-02-16 NOTE — Progress Notes (Signed)
PCP: [name], MD   Tyler [name] is a 80 y.o. male who presents today for electrophysiology followup.  Since his last visit, the patient reports doing reasonably well.  He has occasional dizziness but feels that this is much improved in general.  He has developed BLE edema over the past few months.   Today, he denies symptoms of palpitations, chest pain, shortness of breath,  presyncope, or syncope.  The patient is otherwise without complaint today.   Past Medical History  Diagnosis Date  . Diabetes mellitus 1980  . Hyperlipidemia 1980  . Hypertension 1980  . Asthma   . Allergic rhinitis   . GERD (gastroesophageal reflux disease)   . BPH (benign prostatic hypertrophy)     s/p thermo therapy, has bladdero utlet obst. and instablitiy, incontient  . Anemia   . Chronic constipation   . Sick sinus syndrome   . Paroxysmal atrial fibrillation     status post direct cardioversion- normal cardiac cath 2003, with normal left ventricular function (done after an abnormal cardiolite)  . Bradycardia     severe, on high doese AV-nodal blockers  . Bifascicular block     chronic  . Obesity   . Sleep apnea     noncomplicance w/ CPAP  . Hypoxia     in the past, likely multifactorial  . Gastroparesis     dx in 2006 through gastric scan  . Gynecomastia     nomral mammograms 11/2008  . Shortness of breath   . Chronic kidney disease   . Globus sensation     2012 EGD, Ba swallow, both negative.  MBSS 2012 negative for dysphagia.   Marland Kitchen Atypical atrial flutter    Past Surgical History  Procedure Laterality Date  . Cataract extraction      bilateral  . Rotator cuff repair      left  . Cardiac catheterization  2003    normal, left ventricular function (done after an abnormal cardiolite)  . Tumor removal      off arms  . Pacemaker insertion  06/07/12    SJM implanted by Dr [name] for symptomatic bradycardia    Current Outpatient Prescriptions  Medication Sig Dispense Refill  .  albuterol (VENTOLIN HFA) 108 (90 BASE) MCG/ACT inhaler Inhale 2 puffs into the lungs every 4 (four) hours as needed. For shortness of breath      . amitriptyline (ELAVIL) 25 MG tablet Take 1 tablet (25 mg total) by mouth at bedtime.  30 tablet  1  . Azelastine-Fluticasone 137-50 MCG/ACT SUSP Place 2 sprays into the nose 2 (two) times daily as needed.       . donepezil (ARICEPT) 10 MG tablet Take 10 mg by mouth at bedtime.        Marland Kitchen doxazosin (CARDURA) 2 MG tablet Take 1 tablet (2 mg total) by mouth at bedtime.  30 tablet  0  . ferrous sulfate 325 (65 FE) MG tablet Take 325 mg by mouth daily with breakfast.        . fluticasone (FLONASE) 50 MCG/ACT nasal spray Place 2 sprays into both nostrils daily.      . Fluticasone-Salmeterol (ADVAIR) 100-50 MCG/DOSE AEPB Inhale 1 puff into the lungs 2 (two) times daily. Rinse mouth and spit after each use      . Foot Care Products (PREVALON HEEL PROTECTOR) MISC Apply the boot to the left foot to protect the heel at night and during the day when patient is in his chair  1  each  0  . guaiFENesin (MUCINEX) 600 MG 12 hr tablet Take 600 mg by mouth 2 (two) times daily as needed.      Marland Kitchen. HYDROcodone-acetaminophen (NORCO/VICODIN) 5-325 MG per tablet Take 1-2 tablets by mouth every 6 (six) hours as needed for severe pain.      Marland Kitchen. insulin aspart (NOVOLOG) 100 UNIT/ML injection Inject into the skin 3 (three) times daily before meals. Sliding scale      . insulin glargine (LANTUS) 100 UNIT/ML injection 22 units in the morning and 40 units at bedtime      . metoCLOPramide (REGLAN) 10 MG tablet Take 0.5 tablets (5 mg total) by mouth at bedtime.  30 tablet  11  . montelukast (SINGULAIR) 10 MG tablet Take 10 mg by mouth at bedtime.       Marland Kitchen. neomycin-bacitracin-polymyxin (NEOSPORIN) OINT Apply 1 application topically daily. To left big toe      . Olopatadine HCl (PATADAY) 0.2 % SOLN Apply 1 drop to eye daily.      Marland Kitchen. omeprazole (PRILOSEC) 40 MG capsule Take 40 mg by mouth daily.       . polyethylene glycol (MIRALAX / GLYCOLAX) packet Take 17 g by mouth as directed. Take on Mon.. Wed. and Fri.      . ranitidine (ZANTAC) 150 MG capsule Take 150 mg by mouth 2 (two) times daily.      . sennosides-docusate sodium (SENOKOT-S) 8.6-50 MG tablet Take 2 tablets by mouth at bedtime as needed. For constipation      . sertraline (ZOLOFT) 25 MG tablet Take 25 mg by mouth daily.        . simvastatin (ZOCOR) 20 MG tablet Take 20 mg by mouth at bedtime.        . tamsulosin (FLOMAX) 0.4 MG CAPS capsule Take 0.4 mg by mouth daily.      Marland Kitchen. torsemide (DEMADEX) 100 MG tablet Take 50 mg by mouth daily.        Marland Kitchen. trolamine salicylate (ASPERCREME) 10 % cream Apply 1 application topically as needed. To left shoulder      . Vitamin D, Ergocalciferol, (DRISDOL) 50000 UNITS CAPS Take 50,000 Units by mouth every 7 (seven) days. Fridays      . warfarin (COUMADIN) 2 MG tablet Take as directed by the coumadin clinic      . warfarin (COUMADIN) 5 MG tablet Take as directed by the coumadin clinic      . warfarin (COUMADIN) 5 MG tablet Take as directed by the coumadin clinic      . cephALEXin (KEFLEX) 500 MG capsule Take 1 capsule (500 mg total) by mouth 3 (three) times daily.  30 capsule  0   No current facility-administered medications for this visit.   ROS- all systems are reviewed and negative except as per HPI above  Physical Exam: Filed Vitals:   02/16/14 1058  BP: 131/79  Pulse: 113  Height: 5' 10.5" (1.791 m)  Weight: 267 lb 12.8 oz (121.473 kg)    GEN- The patient is elderly appearing, alert and oriented x 3 today.   Head- normocephalic, atraumatic Eyes-  Sclera clear, conjunctiva pink Ears- hearing intact Oropharynx- clear Lungs- Clear to ausculation bilaterally, normal work of breathing Chest- pacemaker pocket is well healed Heart- Regular rate and rhythm, no murmurs, rubs or gallops, PMI not laterally displaced GI- soft, NT, ND, + BS Extremities- no clubbing, cyanosis, +2 edema In a  wheelchair today  Pacemaker interrogation- reviewed in detail today,  See PACEART report  ekg today reveals sinus rhythm, RBBB, LAHB  Assessment and Plan:  1. Sinus Bradycardia Normal pacemaker function See Arita Miss Art report He was in atrial flutter today with undersensing and therefore had not mode swiched.  His device is reprogrammed today to promote adequate sensing of atrial flutter.  2. Afib/ atrial flutter Continue coumadin Today after discussing risks and benefits of cardioversion and confirming previously therapeutic INRs, I have been able to successfully pace terminate atrial flutter (CL 260 msec).  He will return to see Levora Angel in the device clinic in 6 weeks to see if he is still in sinus and to assess for improvement in edema with sinus rhythm.  3. HTN Stable No change required today  Merlin Return to see Levora Angel in 6 weeks I will see in 6 months

## 2014-02-16 NOTE — Patient Instructions (Signed)
Your physician recommends that you schedule a follow-up appointment in: 6 weeks with Shakila in device clinic and 6 months with Dr Johney FrameAllred

## 2014-03-05 ENCOUNTER — Encounter: Payer: Self-pay | Admitting: Podiatrist

## 2014-03-05 ENCOUNTER — Ambulatory Visit (INDEPENDENT_AMBULATORY_CARE_PROVIDER_SITE_OTHER): Payer: Medicare Other | Admitting: Podiatrist

## 2014-03-05 VITALS — BP 155/85 | HR 60 | Resp 16

## 2014-03-05 DIAGNOSIS — E1142 Type 2 diabetes mellitus with diabetic polyneuropathy: Secondary | ICD-10-CM

## 2014-03-05 DIAGNOSIS — E1140 Type 2 diabetes mellitus with diabetic neuropathy, unspecified: Secondary | ICD-10-CM

## 2014-03-05 DIAGNOSIS — M25472 Effusion, left ankle: Secondary | ICD-10-CM

## 2014-03-05 DIAGNOSIS — M25473 Effusion, unspecified ankle: Secondary | ICD-10-CM

## 2014-03-05 DIAGNOSIS — R609 Edema, unspecified: Secondary | ICD-10-CM

## 2014-03-05 DIAGNOSIS — M25476 Effusion, unspecified foot: Secondary | ICD-10-CM

## 2014-03-05 DIAGNOSIS — L97511 Non-pressure chronic ulcer of other part of right foot limited to breakdown of skin: Secondary | ICD-10-CM

## 2014-03-05 DIAGNOSIS — E1149 Type 2 diabetes mellitus with other diabetic neurological complication: Secondary | ICD-10-CM

## 2014-03-05 DIAGNOSIS — L97509 Non-pressure chronic ulcer of other part of unspecified foot with unspecified severity: Secondary | ICD-10-CM

## 2014-03-05 NOTE — Progress Notes (Signed)
Subjective: Patient presents today for concern over swelling on the left foot and with pain on the right foot do to the shoe hitting on the ankle. Overall he states his feet are uncomfortable and painful.   Objective: Pedal pulses continue to be palpable DP and PT at 1/4 bilateral. Temperature is warm to warm.  The left heel appears to be clearly healed at today's visit. Distal tip of the left hallux has a hyperkeratotic lesion present it does not appear to be infected. Mild swelling left foot is noted but much improved from its appearance previously. The shoe is put on the right foot and it appears to hit at the area of a normal shoe.  Assessment: healing pressure sore left heel, healed ulcer right ankle, pre-ulcer lesion tip of left great toe, swelling  Plan: Applied a boot to the left foot and ordered Unna boot wrap twice weekly for the next 2 weeks. I stretched out the shoe however discussed with him that the shoe hits at the area that any she would hit and that if it's painful with pressure I would recommend him go back into the surgical sandal. I debrided the tip of the left hallux carefully. Applied Iodosorb and a dressing and recommended continuation of covering the tip of the toe to prevent irritation. I will see him back in 2-3 weeks for followup.

## 2014-03-11 ENCOUNTER — Telehealth: Payer: Self-pay | Admitting: *Deleted

## 2014-03-11 NOTE — Telephone Encounter (Signed)
Calling about his orders from 03/05/2014 about the unna boot, isosorbide and a bandaid for his great big toe.  We need an order for home health because at this facility we are not able to do these orders.  Please give me a call back, thank you.

## 2014-03-12 ENCOUNTER — Ambulatory Visit (INDEPENDENT_AMBULATORY_CARE_PROVIDER_SITE_OTHER): Payer: Medicare Other | Admitting: Podiatrist

## 2014-03-12 ENCOUNTER — Encounter: Payer: Self-pay | Admitting: Podiatrist

## 2014-03-12 VITALS — BP 163/69 | HR 64 | Temp 99.4°F | Resp 18 | Ht 70.0 in | Wt 274.0 lb

## 2014-03-12 DIAGNOSIS — L97521 Non-pressure chronic ulcer of other part of left foot limited to breakdown of skin: Secondary | ICD-10-CM

## 2014-03-12 DIAGNOSIS — E1142 Type 2 diabetes mellitus with diabetic polyneuropathy: Secondary | ICD-10-CM

## 2014-03-12 DIAGNOSIS — L97509 Non-pressure chronic ulcer of other part of unspecified foot with unspecified severity: Secondary | ICD-10-CM

## 2014-03-12 DIAGNOSIS — E1140 Type 2 diabetes mellitus with diabetic neuropathy, unspecified: Secondary | ICD-10-CM

## 2014-03-12 DIAGNOSIS — E1149 Type 2 diabetes mellitus with other diabetic neurological complication: Secondary | ICD-10-CM

## 2014-03-12 NOTE — Telephone Encounter (Signed)
If they cannot do an unna boot we can remove it from the orders.  Can they put on a compression stocking?  We could do that instead.  Thanks!

## 2014-03-12 NOTE — Progress Notes (Signed)
   Subjective: Patient presents today for follow up of ulcer tip of left great toe -- remitting and relapsing and for continued concern over swelling on the left foot.  Unna boot was applied at the last visit and he states his facility is unable to apply an unna boot for him due to their rules. He would need a home health order for the unna boots.    Objective: Pedal pulses continue to be palpable DP and PT at 1/4 bilateral. Temperature is warm to warm. The left heel appears healed at today's visit. Distal tip of the left hallux has a hyperkeratotic lesion present it does not appear to be infected. Mild swelling left foot is noted but much improved from its appearance previously. No problems with the right foot or ankle today.  Assessment: healing pressure sore left heel, healed ulcer right ankle, pre-ulcer lesion tip of left great toe, swelling   Plan: Because there is great difficulty in working with his facility I decided to order him juxta light compression stockings to be applied for the left foot and lower leg. Light debridement of ulcer left great toe was performed and Iodosorb and a dressing was applied.  Instructions for af tercare including use of Iodosorb gel were written out for the staff at his facility to follow.. I will see him back for followup

## 2014-03-13 NOTE — Telephone Encounter (Signed)
We need his birth date before I can run his Medicare report.  Please call me back.  I called and left her a message of his birth date.

## 2014-03-25 ENCOUNTER — Ambulatory Visit: Payer: Medicare Other | Admitting: Podiatrist

## 2014-03-26 ENCOUNTER — Ambulatory Visit: Payer: Medicare Other | Admitting: Podiatrist

## 2014-04-02 ENCOUNTER — Ambulatory Visit (INDEPENDENT_AMBULATORY_CARE_PROVIDER_SITE_OTHER): Payer: Medicare Other | Admitting: *Deleted

## 2014-04-02 ENCOUNTER — Ambulatory Visit: Payer: Medicare Other | Admitting: Podiatrist

## 2014-04-02 DIAGNOSIS — R001 Bradycardia, unspecified: Secondary | ICD-10-CM

## 2014-04-02 DIAGNOSIS — I498 Other specified cardiac arrhythmias: Secondary | ICD-10-CM

## 2014-04-02 LAB — MDC_IDC_ENUM_SESS_TYPE_INCLINIC
Battery Remaining Longevity: 111.6 mo
Battery Voltage: 2.95 V
Brady Statistic RV Percent Paced: 0.17 %
Date Time Interrogation Session: 20150910120118
Implantable Pulse Generator Model: 2210
Implantable Pulse Generator Serial Number: 7411677
Lead Channel Impedance Value: 550 Ohm
Lead Channel Pacing Threshold Amplitude: 0.5 V
Lead Channel Pacing Threshold Amplitude: 0.75 V
Lead Channel Pacing Threshold Pulse Width: 0.5 ms
Lead Channel Pacing Threshold Pulse Width: 0.5 ms
Lead Channel Pacing Threshold Pulse Width: 0.5 ms
Lead Channel Setting Pacing Amplitude: 2.5 V
MDC IDC MSMT LEADCHNL RA IMPEDANCE VALUE: 462.5 Ohm
MDC IDC MSMT LEADCHNL RA PACING THRESHOLD AMPLITUDE: 0.75 V
MDC IDC MSMT LEADCHNL RA SENSING INTR AMPL: 3.6 mV
MDC IDC MSMT LEADCHNL RV PACING THRESHOLD AMPLITUDE: 0.5 V
MDC IDC MSMT LEADCHNL RV PACING THRESHOLD PULSEWIDTH: 0.5 ms
MDC IDC MSMT LEADCHNL RV SENSING INTR AMPL: 6.6 mV
MDC IDC SET LEADCHNL RA PACING AMPLITUDE: 2 V
MDC IDC SET LEADCHNL RV PACING PULSEWIDTH: 0.5 ms
MDC IDC SET LEADCHNL RV SENSING SENSITIVITY: 2 mV
MDC IDC STAT BRADY RA PERCENT PACED: 66 %

## 2014-04-02 NOTE — Progress Notes (Signed)
Follow up from visit 02/16/14. Pacemaker check in clinic. Normal device function. Thresholds, sensing, impedances consistent with previous measurements. Device programmed to maximize longevity. No mode switch or high ventricular rates noted. Device programmed at appropriate safety margins. Histogram distribution appropriate for patient activity level. Device programmed to optimize intrinsic conduction. Estimated longevity 7.9 years. Patient enrolled in remote follow-up/TTM's with [employer]. Plan to follow every 3 months remotely and see annually in office. Patient education completed.  Merlin 07/06/14.  + pitting edema of the left ankle only.

## 2014-04-08 ENCOUNTER — Encounter: Payer: Self-pay | Admitting: Internal Medicine

## 2014-04-27 ENCOUNTER — Encounter (HOSPITAL_BASED_OUTPATIENT_CLINIC_OR_DEPARTMENT_OTHER): Payer: Medicare Other | Attending: Plastic Surgery

## 2014-04-27 DIAGNOSIS — L97529 Non-pressure chronic ulcer of other part of left foot with unspecified severity: Secondary | ICD-10-CM | POA: Insufficient documentation

## 2014-04-29 NOTE — Consult Note (Signed)
NAMDarcel [name]:  Tyler [name], Tyler [name]            ACCOUNT NO.:  [id number]  MEDICAL RECORD NO.:  [id number]  LOCATION:  FOOT                         FACILITY:  [facility]  PHYSICIAN:  Glenna FellowsBrinda Tacuma Graffam, MD   DATE OF BIRTH:  July 13, 1934  DATE OF CONSULTATION:  04/27/2014 DATE OF DISCHARGE:                                CONSULTATION   CHIEF COMPLAINT:  Left great toe distal phalanx ulceration Wagner 2.  HISTORY OF PRESENT ILLNESS:  The patient is a 80 year old ambulatory male that has been under the care of Dr. [name] of Podiatry for ulcer of a distal phalanx of the left hallux.  The patient has undergone previous care including Unna boot placement, Iodosorb dressings, and DuoDerm.  Exam of the chart reveals that the ulcer has been present on and off for at least 2 months' time.  It also appears that the patient has had ulcerations over the right fifth toe and left heel that has gone on to heal.  PAST MEDICAL HISTORY: 1. Hyperlipidemia. 2. Hypertension. 3. Diabetes mellitus. 4. Paroxysmal atrial fibrillation. 5. Sick sinus syndrome with pacemaker. 6. Atypical atrial flutter. 7. Chronic kidney disease. 8. Sleep apnea. 9. Obesity.  PAST SURGICAL HISTORY:  Pacemaker insertion in 2013; cardiac catheterization in 2003, which was read as normal; rotator cuff repair on the left; bilateral cataract extractions.  SOCIAL HISTORY:  The patient reports he quit smoking in 1973.  He is also a former smoker and tobacco user.  ALLERGIES:  NEOSPORIN.  MEDICATIONS:  Albuterol, amitriptyline, Aricept, Cardura, ferrous sulfate, Flonase, Advair, Neurontin, Mucinex, Reglan, Singulair, torsemide, Zocor, Zoloft, Coumadin, ranitidine, MiraLax.  PHYSICAL EXAMINATION:  VITAL SIGNS:  Blood pressure is 150/50, blood sugar is 260, height is 5 feet 10 inches, weight is 266 pounds, temperature is 98, pulse 63, respirations 20, ABI is calculated of 1.06 on the right, left is 1.13.  He has absent sensation by  Semmes-Weinstein test over the first and third plantar surface of the toes on the left side.  He has an ulceration Wagner 2 over the left distal phalanx great toe measured at 0.3 x 0.3 x 0.1 cm.  He has palpable DP and posterior tibialis.  Wound is clean in nature and no debridement was performed.  The base of the wound is granulated.  We will institute collagen dressings and plan for followup in 1 week's time.  He does have offloading shoes.  We will need to obtain recent laboratory, they are not available.  The last hemoglobin A1c from December 2014 is 7.9.  We will plan for followup in 1 week's time.          ______________________________ Glenna FellowsBrinda Herta Hink, MD MBA     BT/MEDQ  D:  04/29/2014  T:  04/29/2014  Job:  [id number]

## 2014-05-04 ENCOUNTER — Other Ambulatory Visit (HOSPITAL_COMMUNITY): Payer: Self-pay | Admitting: Plastic Surgery

## 2014-05-04 ENCOUNTER — Ambulatory Visit (HOSPITAL_COMMUNITY)
Admission: RE | Admit: 2014-05-04 | Discharge: 2014-05-04 | Disposition: A | Discharge status: Home / Self Care | Payer: Medicare Other | Source: Ambulatory Visit | Attending: Diagnostic Radiology | Admitting: Diagnostic Radiology

## 2014-05-04 DIAGNOSIS — X58XXXA Exposure to other specified factors, initial encounter: Secondary | ICD-10-CM | POA: Diagnosis not present

## 2014-05-04 DIAGNOSIS — M869 Osteomyelitis, unspecified: Secondary | ICD-10-CM

## 2014-05-04 DIAGNOSIS — S91102A Unspecified open wound of left great toe without damage to nail, initial encounter: Secondary | ICD-10-CM | POA: Diagnosis present

## 2014-05-04 DIAGNOSIS — L97529 Non-pressure chronic ulcer of other part of left foot with unspecified severity: Secondary | ICD-10-CM | POA: Diagnosis not present

## 2014-05-18 DIAGNOSIS — L97529 Non-pressure chronic ulcer of other part of left foot with unspecified severity: Secondary | ICD-10-CM | POA: Diagnosis not present

## 2014-06-01 ENCOUNTER — Encounter (HOSPITAL_BASED_OUTPATIENT_CLINIC_OR_DEPARTMENT_OTHER): Payer: Medicare Other | Attending: Plastic Surgery

## 2014-06-01 DIAGNOSIS — L89622 Pressure ulcer of left heel, stage 2: Secondary | ICD-10-CM | POA: Diagnosis not present

## 2014-06-01 DIAGNOSIS — L97521 Non-pressure chronic ulcer of other part of left foot limited to breakdown of skin: Secondary | ICD-10-CM | POA: Insufficient documentation

## 2014-06-15 DIAGNOSIS — L89622 Pressure ulcer of left heel, stage 2: Secondary | ICD-10-CM | POA: Diagnosis not present

## 2014-06-15 DIAGNOSIS — L97521 Non-pressure chronic ulcer of other part of left foot limited to breakdown of skin: Secondary | ICD-10-CM | POA: Diagnosis not present

## 2014-06-17 ENCOUNTER — Encounter: Payer: Self-pay | Admitting: Surgery

## 2014-06-22 ENCOUNTER — Other Ambulatory Visit (HOSPITAL_COMMUNITY): Payer: Medicare Other

## 2014-06-22 ENCOUNTER — Encounter: Payer: Self-pay | Admitting: Surgery

## 2014-06-22 ENCOUNTER — Ambulatory Visit (INDEPENDENT_AMBULATORY_CARE_PROVIDER_SITE_OTHER): Payer: Medicare Other | Admitting: Surgery

## 2014-06-22 ENCOUNTER — Ambulatory Visit (HOSPITAL_COMMUNITY)
Admission: RE | Admit: 2014-06-22 | Discharge: 2014-06-22 | Disposition: A | Discharge status: Home / Self Care | Payer: Medicare Other | Source: Ambulatory Visit | Attending: Surgery | Admitting: Surgery

## 2014-06-22 ENCOUNTER — Encounter (HOSPITAL_COMMUNITY): Payer: Medicare Other

## 2014-06-22 ENCOUNTER — Other Ambulatory Visit: Payer: Self-pay

## 2014-06-22 VITALS — BP 134/62 | HR 88 | Temp 97.7°F | Resp 18 | Ht 70.5 in | Wt 270.0 lb

## 2014-06-22 DIAGNOSIS — I70244 Atherosclerosis of native arteries of left leg with ulceration of heel and midfoot: Secondary | ICD-10-CM | POA: Insufficient documentation

## 2014-06-22 DIAGNOSIS — I70245 Atherosclerosis of native arteries of left leg with ulceration of other part of foot: Secondary | ICD-10-CM

## 2014-06-22 DIAGNOSIS — I70269 Atherosclerosis of native arteries of extremities with gangrene, unspecified extremity: Secondary | ICD-10-CM

## 2014-06-22 NOTE — Progress Notes (Signed)
[facility]   Referred by:  [name], MD [address] [place], [place] [address]  Reason for referral: diabetic ulcer of left great toe.   History of Present Illness  Tyler [name] is a 80 y.o. (10-19-1933) male who presents with chief complaint: of ulcer at distal left great toe, left heel and right lateral heel ulcer that have been present since January of this year. He reports the wounds progressively worsened and saw a podiatrist initially. Two months ago, he started going to the wound center for local wound care. His wounds have since healed. He lives at [facility] assisted living facility and has been using a wheelchair for the past 7 months due to his wounds and need for elevation. He has some pain still with his left great toe. He says he ambulated with a walker previously. He denies any prior history of non-healing wounds. He denies intermittent claudication. He notes some intermittent throbbing in his left foot at night. He was seen back in 2010 and had normal ABIs at that time.   He has insulin dependent diabetes. He has hypertension not currently on antihypertensives. He takes a statin for hypercholesterolemia. He has a left sided pacemaker placed in 2013 for symptomatic bradycardia. He is on coumadin for atrial fibrillation.    Past Medical History  Diagnosis Date  . Diabetes mellitus 1980  . Hyperlipidemia 1980  . Hypertension 1980  . Asthma   . Allergic rhinitis   . GERD (gastroesophageal reflux disease)   . BPH (benign prostatic hypertrophy)     s/p thermo therapy, has bladdero utlet obst. and instablitiy, incontient  . Anemia   . Chronic constipation   . Sick sinus syndrome   . Paroxysmal atrial fibrillation     status post direct cardioversion- normal cardiac cath 2003, with normal left ventricular function (done after an abnormal cardiolite)  . Bradycardia     severe, on high doese AV-nodal blockers  .  Bifascicular block     chronic  . Obesity   . Sleep apnea     noncomplicance w/ CPAP  . Hypoxia     in the past, likely multifactorial  . Gastroparesis     dx in 2006 through gastric scan  . Gynecomastia     nomral mammograms 11/2008  . Shortness of breath   . Chronic kidney disease   . Globus sensation     2012 EGD, Ba swallow, both negative.  MBSS 2012 negative for dysphagia.   Marland Kitchen Atypical atrial flutter     Past Surgical History  Procedure Laterality Date  . Cataract extraction      bilateral  . Rotator cuff repair      left  . Cardiac catheterization  2003    normal, left ventricular function (done after an abnormal cardiolite)  . Tumor removal      off arms  . Pacemaker insertion  06/07/12    SJM implanted by Dr [name] for symptomatic bradycardia    History   Social History  . Marital Status: Widowed    Spouse Name: N/A    Number of Children: 2  . Years of Education: N/A   Occupational History  . retired    Social History Main Topics  . Smoking status: Former Smoker    Types: Cigarettes    Quit date: 07/25/1971  . Smokeless tobacco: Former Neurosurgeon    Types: Chew  . Alcohol Use: No  . Drug Use: No  .  Sexual Activity: Not on file   Other Topics Concern  . Not on file   Social History Narrative   Has lived in facility x aprox 4 years   Moved to [place] 08-2008   Does not have a POA   Does not have a living will    Family History  Problem Relation Age of Onset  . Colon cancer Neg Hx   . Prostate cancer Maternal Uncle   . Heart disease Paternal Uncle     x 3, paternal aunt     Current Outpatient Prescriptions on File Prior to Visit  Medication Sig Dispense Refill  . albuterol (VENTOLIN HFA) 108 (90 BASE) MCG/ACT inhaler Inhale 2 puffs into the lungs every 4 (four) hours as needed. For shortness of breath    . amitriptyline (ELAVIL) 25 MG tablet Take 1 tablet (25 mg total) by mouth at bedtime. 30 tablet 1  . Azelastine-Fluticasone 137-50 MCG/ACT  SUSP Place 2 sprays into the nose 2 (two) times daily as needed.     . donepezil (ARICEPT) 10 MG tablet Take 10 mg by mouth at bedtime.      Marland Kitchen doxazosin (CARDURA) 2 MG tablet Take 1 tablet (2 mg total) by mouth at bedtime. 30 tablet 0  . ferrous sulfate 325 (65 FE) MG tablet Take 325 mg by mouth daily with breakfast.      . fluticasone (FLONASE) 50 MCG/ACT nasal spray Place 2 sprays into both nostrils daily.    . Fluticasone-Salmeterol (ADVAIR) 100-50 MCG/DOSE AEPB Inhale 1 puff into the lungs 2 (two) times daily. Rinse mouth and spit after each use    . Foot Care Products (PREVALON HEEL PROTECTOR) MISC Apply the boot to the left foot to protect the heel at night and during the day when patient is in his chair 1 each 0  . gabapentin (NEURONTIN) 100 MG capsule Take 100 mg by mouth 3 (three) times daily.    Marland Kitchen guaiFENesin (MUCINEX) 600 MG 12 hr tablet Take 600 mg by mouth 2 (two) times daily as needed.    Marland Kitchen HYDROcodone-acetaminophen (NORCO/VICODIN) 5-325 MG per tablet Take 1-2 tablets by mouth every 6 (six) hours as needed for severe pain.    Marland Kitchen insulin aspart (NOVOLOG) 100 UNIT/ML injection Inject into the skin 3 (three) times daily before meals. 10 units before breakfast and lunch , 12units before dinner    . insulin glargine (LANTUS) 100 UNIT/ML injection 22 units in the morning and 40 units at bedtime    . montelukast (SINGULAIR) 10 MG tablet Take 10 mg by mouth at bedtime.     Marland Kitchen neomycin-bacitracin-polymyxin (NEOSPORIN) OINT Apply 1 application topically daily. To left big toe    . Olopatadine HCl (PATADAY) 0.2 % SOLN Apply 1 drop to eye daily.    Marland Kitchen omeprazole (PRILOSEC) 40 MG capsule Take 40 mg by mouth daily.    . polyethylene glycol (MIRALAX / GLYCOLAX) packet Take 17 g by mouth as directed. Take on Mon.. Wed. and Fri.    . ranitidine (ZANTAC) 150 MG capsule Take 150 mg by mouth 2 (two) times daily.    . sennosides-docusate sodium (SENOKOT-S) 8.6-50 MG tablet Take 2 tablets by mouth at bedtime  as needed. For constipation    . sertraline (ZOLOFT) 25 MG tablet Take 25 mg by mouth daily.      . simvastatin (ZOCOR) 20 MG tablet Take 20 mg by mouth at bedtime.      . tamsulosin (FLOMAX) 0.4 MG CAPS capsule Take 0.4  mg by mouth daily.    Marland Kitchen. torsemide (DEMADEX) 100 MG tablet Take 50 mg by mouth daily.      Marland Kitchen. trolamine salicylate (ASPERCREME) 10 % cream Apply 1 application topically as needed. To left shoulder    . Vitamin D, Ergocalciferol, (DRISDOL) 50000 UNITS CAPS Take 50,000 Units by mouth every 7 (seven) days. Fridays    . warfarin (COUMADIN) 2 MG tablet Take as directed by the coumadin clinic    . metoCLOPramide (REGLAN) 10 MG tablet Take 0.5 tablets (5 mg total) by mouth at bedtime. 30 tablet 11   No current facility-administered medications on file prior to visit.    Allergies  Allergen Reactions  . Neosporin Original [Bacitracin-Neomycin-Polymyxin]     Rinaldo Cloudamela McWhite from [facility] [phone] states pt is allergic to Neosporin and Polysporin ointments.      REVIEW OF SYSTEMS:  (Positives checked otherwise negative)  CARDIOVASCULAR:  []  chest pain, []  chest pressure, []  palpitations, []  shortness of breath when laying flat, []  shortness of breath with exertion,  []  pain in feet when walking, [x]  pain in feet when laying flat, []  history of blood clot in veins (DVT), []  history of phlebitis, [x]  swelling in legs, []  varicose veins  PULMONARY:  []  productive cough, []  asthma, []  wheezing  NEUROLOGIC:  []  weakness in arms or legs, []  numbness in arms or legs, []  difficulty speaking or slurred speech, []  temporary loss of vision in one eye, []  dizziness  HEMATOLOGIC:  []  bleeding problems, []  problems with blood clotting too easily  MUSCULOSKEL:  []  joint pain, []  joint swelling  GASTROINTEST:  []  vomiting blood, []  blood in stool     GENITOURINARY:  []  burning with urination, []  blood in urine  PSYCHIATRIC:  []  history of major depression  INTEGUMENTARY:  []  rashes,  [x]  ulcers  CONSTITUTIONAL:  []  fever, []  chills  For VQI Use Only  PRE-ADM LIVING: Nursing home  AMB STATUS: Wheelchair  CAD Sx: None  PRIOR CHF: None  STRESS TEST: [x ] No, [ ]  Normal, [ ]  + ischemia, [ ]  + MI, [ ]  Both   Physical Examination  Filed Vitals:   06/22/14 1318  BP: 134/62  Pulse: 88  Temp: 97.7 F (36.5 C)  TempSrc: Oral  Resp: 18  Height: 5' 10.5" (1.791 m)  Weight: 270 lb (122.471 kg)  SpO2: 97%    Body mass index is 38.18 kg/(m^2).  General: A&O x 3, WD Obese male in NAD  Head: [place]/AT  Neck: Supple  Pulmonary: Sym exp, good air movt, CTAB, no rales, rhonchi, & wheezing  Cardiac: RRR, Nl S1, S2, no Murmurs, rubs or gallops  Vascular: Vessel Right Left  Radial Palpable Palpable  Carotid Palpable, without bruit Palpable, without bruit  Aorta Not palpable N/A  Femoral Not Palpable Not Palpable  Popliteal Not palpable Not palpable  PT Not  Palpable Not Palpable  DP Faintly Palpable  Palpable 2+   Gastrointestinal: obese, moderately firm, non-tender, no organomegaly, normoactive bowel sounds.   Musculoskeletal: M/S 5/5 throughout. Well healed bilateral heel ulcers. Callus of left heel. Distal left great toe ulcer healed with callus. 1+ bilateral lower extremity edema.   Neurologic: CN 2-12 intact. Pain and light touch intact in extremities. Motor exam as listed above  Psychiatric: Judgment intact, Mood & affect appropriate for pt's clinical situation Poor judgement,  Dermatologic: See M/S exam for extremity exam, no rashes otherwise noted   Non-Invasive Vascular Imaging  ABI (Date: 06/22/2014)  RLE: 1.30,  triphasic waveforms throughout, TBI: 0.90  LLE: 1.36, triphasic waveforms throughout , TBI: 1.06  Medical Decision Making  Tyler [name] is a 80 y.o. male who presents with diabetic ulcer of left great toe that is nearly healed. His previous bilateral heel ulcers have healed as well. He has an easily palpable left dorsalis  pedis pulse. His ABIs today are normal with triphasic waveforms bilaterally. Continue local wound care to left great toe and foot hygiene. Recommend 20-30 mm compression stockings for swelling and encourage ambulation. He will follow up on an as needed basis.    Maris BergerKimberly Trinh, PA-C [facility] [place] Office: [phone] Pager: [phone]  06/22/2014, 1:49 PM  I agree with the above.  I have seen and examined the patient.  He has a history of bilateral ulcers, all of which have healed with the exception of a left great toe area that is just a scab now.  He has an easily palpable left dorsalis pedis pulse.  I checked ABIs today which were normal with triphasic waveforms.  He does have bilateral edema, and therefore I have recommended compression stockings to minimize his chance of ulcer recurrence or significant venous insufficiency problems.  The patient will contact me should he develop another ulcer.  Durene CalWells Ismahan Lippman

## 2014-06-29 ENCOUNTER — Encounter (HOSPITAL_BASED_OUTPATIENT_CLINIC_OR_DEPARTMENT_OTHER): Payer: Medicare Other | Attending: Plastic Surgery

## 2014-06-29 DIAGNOSIS — L97529 Non-pressure chronic ulcer of other part of left foot with unspecified severity: Secondary | ICD-10-CM | POA: Diagnosis not present

## 2014-07-02 ENCOUNTER — Encounter (HOSPITAL_COMMUNITY): Payer: Self-pay | Admitting: Internal Medicine

## 2014-07-06 ENCOUNTER — Telehealth: Payer: Self-pay | Admitting: Internal Medicine

## 2014-07-06 ENCOUNTER — Ambulatory Visit (INDEPENDENT_AMBULATORY_CARE_PROVIDER_SITE_OTHER): Payer: Medicare Other | Admitting: *Deleted

## 2014-07-06 ENCOUNTER — Encounter: Payer: Self-pay | Admitting: Internal Medicine

## 2014-07-06 DIAGNOSIS — I480 Paroxysmal atrial fibrillation: Secondary | ICD-10-CM

## 2014-07-06 LAB — MDC_IDC_ENUM_SESS_TYPE_REMOTE
Brady Statistic AP VP Percent: 1 %
Brady Statistic AP VS Percent: 47 %
Brady Statistic AS VP Percent: 1 %
Brady Statistic AS VS Percent: 41 %
Brady Statistic RV Percent Paced: 1.4 %
Implantable Pulse Generator Model: 2210
Implantable Pulse Generator Serial Number: 7411677
Lead Channel Impedance Value: 560 Ohm
Lead Channel Pacing Threshold Amplitude: 0.5 V
Lead Channel Pacing Threshold Pulse Width: 0.5 ms
Lead Channel Sensing Intrinsic Amplitude: 6.6 mV
Lead Channel Setting Pacing Amplitude: 2 V
Lead Channel Setting Pacing Amplitude: 2.5 V
MDC IDC MSMT BATTERY REMAINING LONGEVITY: 89 mo
MDC IDC MSMT BATTERY REMAINING PERCENTAGE: 74 %
MDC IDC MSMT BATTERY VOLTAGE: 2.95 V
MDC IDC MSMT LEADCHNL RA IMPEDANCE VALUE: 480 Ohm
MDC IDC MSMT LEADCHNL RA PACING THRESHOLD AMPLITUDE: 0.75 V
MDC IDC MSMT LEADCHNL RA SENSING INTR AMPL: 3.9 mV
MDC IDC MSMT LEADCHNL RV PACING THRESHOLD PULSEWIDTH: 0.5 ms
MDC IDC SESS DTM: 20151214075935
MDC IDC SET LEADCHNL RV PACING PULSEWIDTH: 0.5 ms
MDC IDC SET LEADCHNL RV SENSING SENSITIVITY: 2 mV
MDC IDC STAT BRADY RA PERCENT PACED: 41 %

## 2014-07-06 NOTE — Telephone Encounter (Signed)
New message         Pt nurse from nursing home facility would like to know if transmission was received

## 2014-07-06 NOTE — Telephone Encounter (Signed)
Updated facility we have received pt's transmission.

## 2014-07-06 NOTE — Progress Notes (Signed)
Remote pacemaker transmission.   

## 2014-07-13 ENCOUNTER — Encounter: Payer: Self-pay | Admitting: Cardiology

## 2014-07-13 DIAGNOSIS — L97529 Non-pressure chronic ulcer of other part of left foot with unspecified severity: Secondary | ICD-10-CM | POA: Diagnosis not present

## 2014-07-27 ENCOUNTER — Encounter (HOSPITAL_BASED_OUTPATIENT_CLINIC_OR_DEPARTMENT_OTHER): Payer: Medicare Other | Attending: Plastic Surgery

## 2014-07-27 DIAGNOSIS — L97521 Non-pressure chronic ulcer of other part of left foot limited to breakdown of skin: Secondary | ICD-10-CM | POA: Diagnosis not present

## 2014-07-27 DIAGNOSIS — E11621 Type 2 diabetes mellitus with foot ulcer: Secondary | ICD-10-CM | POA: Diagnosis present

## 2014-07-28 NOTE — Progress Notes (Signed)
[facility]  NAME:  Tyler [name], Tyler [name]            ACCOUNT NO.:  [id number]  MEDICAL RECORD NO.:  [id number]      DATE OF BIRTH:  [birth year]  PHYSICIAN:  Wayland Denislaire Sanger, DO            VISIT DATE:07/27/2014                                  OFFICE VISIT   The patient is a 81 year old male who is here for a followup on his left great toe, distal diabetic foot ulcer.  He has been using collagen on the area with some success.  [facility]  PAST MEDICAL HISTORY:  Positive for AFib, allergic rhinitis, chronic sinusitis, radiculopathy in the upper extremities, hyperlipidemia, hypertension, chronic kidney disease, asthma, gastroesophageal reflux disease, benign prostatic hypertrophy, depression, diabetes, neuropathy, obesity, pressure ulcers.  He has had a pacemaker and a rotator cuff repair surgically.  ALLERGIES:  Include Neosporin.  MEDICATIONS:  He is on multiple medications that were reviewed.  Otherwise, there have been no changes in his medical history.  PHYSICAL EXAMINATION:  On exam, he is alert, oriented.  He is pleasant. All questions are answered.  His pupils are equal.  His breathing is unlabored.  His heart rate is regular.  His abdomen is large, but soft. Nontender.  The lower extremity pulses present.  No sign of infection. The ulcer is distal.  There is no drainage, no redness.  Does not even appear to be tender.  His toenails are long and needs to be trimmed.  PLAN:  So the plan will be set him up with Podiatry for toenail clipping.  Continue with the collagen, elevation, multivitamin, vitamin C, zinc, protein for diabetic, and we will see him back in 2 weeks.     Wayland Denislaire Sanger, DO     CS/MEDQ  D:  07/27/2014  T:  07/28/2014  Job:  [id number]

## 2014-08-10 DIAGNOSIS — L97521 Non-pressure chronic ulcer of other part of left foot limited to breakdown of skin: Secondary | ICD-10-CM | POA: Diagnosis not present

## 2014-08-10 DIAGNOSIS — E11621 Type 2 diabetes mellitus with foot ulcer: Secondary | ICD-10-CM | POA: Diagnosis not present

## 2014-08-13 ENCOUNTER — Encounter: Payer: Self-pay | Admitting: Podiatrist

## 2014-08-13 ENCOUNTER — Ambulatory Visit (INDEPENDENT_AMBULATORY_CARE_PROVIDER_SITE_OTHER): Payer: Medicare Other | Admitting: Podiatrist

## 2014-08-13 DIAGNOSIS — E1140 Type 2 diabetes mellitus with diabetic neuropathy, unspecified: Secondary | ICD-10-CM

## 2014-08-13 DIAGNOSIS — B351 Tinea unguium: Secondary | ICD-10-CM

## 2014-08-13 DIAGNOSIS — M79673 Pain in unspecified foot: Secondary | ICD-10-CM

## 2014-08-13 LAB — PROTIME-INR

## 2014-08-13 NOTE — Progress Notes (Signed)
   Subjective: Patient presents today for followup of foot and nail care. The left foot swelling has decreased decreased significantly. The heel is still sore but no active ulceration is present. The ulceration on the distal tip of the left hallux appears to have healed from his treatment at the wound care center.  Objective: Pedal pulses continue to be palpable DP and PT at 1/4 bilateral. Temperature is warm to warm. Patient's toenails are elongated, thickened and mycotic. The left heel is soft and does not have any sign of breakdown. Distal tip of the left hallux has a hyperkeratotic lesion present it does not appear to be infected.  Assessment: Symptomatic mycotic toenails, healing pressure sore left heel, healed ulcer left great toe  Plan: Debrided the toenails without complication recommended elevation of his heel off of the supporting surface as much as possible to allow the left heel to improve. he'll be seen in 3 months or sooner if questions or concerns arise.. 

## 2014-08-24 ENCOUNTER — Encounter (HOSPITAL_BASED_OUTPATIENT_CLINIC_OR_DEPARTMENT_OTHER): Payer: Medicare Other | Attending: Plastic Surgery

## 2014-08-24 DIAGNOSIS — L97529 Non-pressure chronic ulcer of other part of left foot with unspecified severity: Secondary | ICD-10-CM | POA: Insufficient documentation

## 2014-08-24 DIAGNOSIS — E11621 Type 2 diabetes mellitus with foot ulcer: Secondary | ICD-10-CM | POA: Insufficient documentation

## 2014-09-14 DIAGNOSIS — L97529 Non-pressure chronic ulcer of other part of left foot with unspecified severity: Secondary | ICD-10-CM | POA: Diagnosis not present

## 2014-09-14 DIAGNOSIS — E11621 Type 2 diabetes mellitus with foot ulcer: Secondary | ICD-10-CM | POA: Diagnosis not present

## 2014-09-14 LAB — PROTIME-INR

## 2014-09-28 ENCOUNTER — Encounter (HOSPITAL_BASED_OUTPATIENT_CLINIC_OR_DEPARTMENT_OTHER): Payer: Medicare Other | Attending: Plastic Surgery

## 2014-09-28 DIAGNOSIS — E11621 Type 2 diabetes mellitus with foot ulcer: Secondary | ICD-10-CM | POA: Diagnosis not present

## 2014-09-28 DIAGNOSIS — L97529 Non-pressure chronic ulcer of other part of left foot with unspecified severity: Secondary | ICD-10-CM | POA: Insufficient documentation

## 2014-10-07 ENCOUNTER — Ambulatory Visit (INDEPENDENT_AMBULATORY_CARE_PROVIDER_SITE_OTHER): Payer: Medicare Other | Admitting: *Deleted

## 2014-10-07 DIAGNOSIS — I480 Paroxysmal atrial fibrillation: Secondary | ICD-10-CM | POA: Diagnosis not present

## 2014-10-07 LAB — MDC_IDC_ENUM_SESS_TYPE_REMOTE
Battery Remaining Longevity: 80 mo
Battery Remaining Percentage: 67 %
Battery Voltage: 2.93 V
Brady Statistic AP VP Percent: 1 %
Brady Statistic RA Percent Paced: 22 %
Brady Statistic RV Percent Paced: 1.4 %
Date Time Interrogation Session: 20160316063527
Implantable Pulse Generator Model: 2210
Implantable Pulse Generator Serial Number: 7411677
Lead Channel Impedance Value: 510 Ohm
Lead Channel Impedance Value: 580 Ohm
Lead Channel Pacing Threshold Amplitude: 0.5 V
Lead Channel Pacing Threshold Pulse Width: 0.5 ms
Lead Channel Pacing Threshold Pulse Width: 0.5 ms
Lead Channel Sensing Intrinsic Amplitude: 2.4 mV
Lead Channel Setting Pacing Amplitude: 2 V
Lead Channel Setting Pacing Pulse Width: 0.5 ms
Lead Channel Setting Sensing Sensitivity: 2 mV
MDC IDC MSMT LEADCHNL RA PACING THRESHOLD AMPLITUDE: 0.75 V
MDC IDC MSMT LEADCHNL RV SENSING INTR AMPL: 9.1 mV
MDC IDC SET LEADCHNL RV PACING AMPLITUDE: 2.5 V
MDC IDC STAT BRADY AP VS PERCENT: 44 %
MDC IDC STAT BRADY AS VP PERCENT: 1.4 %
MDC IDC STAT BRADY AS VS PERCENT: 43 %

## 2014-10-07 NOTE — Progress Notes (Signed)
Remote pacemaker transmission.   

## 2014-10-13 LAB — PROTIME-INR

## 2014-10-14 ENCOUNTER — Telehealth: Payer: Self-pay | Admitting: *Deleted

## 2014-10-14 NOTE — Telephone Encounter (Signed)
Informed patient that remote from 3/16 shows increased AF burden since December. Patient admits to worsening SOB. Per Fawn KirkJA pt needs an appt w/ Gypsy BalsamAmber Seiler, NP. Will forward to Glynda JaegerMelissa Tatum for scheduling---pt states that appts need to be made with [facility] (preferred #) and that he is unable to come in on Wed/Fri.

## 2014-10-22 ENCOUNTER — Encounter: Payer: Self-pay | Admitting: Internal Medicine

## 2014-10-31 ENCOUNTER — Encounter: Payer: Self-pay | Admitting: Nurse Practitioner

## 2014-10-31 NOTE — Progress Notes (Addendum)
Electrophysiology Office Note Date: 11/02/2014  ID:  Tyler [name], Tyler [name] 1934-02-17, MRN [id number]  PCP: [name], MD Electrophysiologist: Allred  CC: atrial arrhythmias seen on pacemaker remote interrogation  Tyler [name] is a 81 y.o. male is seen today for Dr [name]. He presents today for add-on electrophysiology followup.  Recent pacemaker remote transmission demonstrated significant increase in atrial arrhythmia burden with increased shortness of breath.  He has a long standing history of atrial fibrillation and atrial flutter.  At his last office visit in July of 2015, his atrial flutter was pace terminated and his symptoms of LE edema and shortness of breath improved. Since 03-2014, he has been in atrial flutter 41% of the time. He reports increased shortness of breath, LE edema recently.  He denies chest pain, nausea, vomiting, dizziness, syncope.  Last echo 05/2012 demonstrated EF 60-65%, no RWMA, grade 2 diastolic dysfunction, mildly dilated LA.  Device History: STJ dual chamber PPM implanted 2013 for sick sinus syndrome   Past Medical History  Diagnosis Date  . Diabetes mellitus    . Hyperlipidemia    . Hypertension    . Asthma   . Allergic rhinitis   . GERD (gastroesophageal reflux disease)   . BPH (benign prostatic hypertrophy)     s/p thermo therapy, has bladdero utlet obst. and instablitiy, incontient  . Anemia   . Chronic constipation   . Sick sinus syndrome     a. s/p STJ dual chamber pacemaker  . Paroxysmal atrial fibrillation     a. status post direct cardioversion b. anticoagulated with Warfarin  . Obesity   . Sleep apnea     noncomplicance w/ CPAP  . Gastroparesis     dx in 2006 through gastric scan  . Gynecomastia     nomral mammograms 11/2008  . Chronic kidney disease   . Globus sensation     2012 EGD, Ba swallow, both negative.  MBSS 2012 negative for dysphagia.   Marland Kitchen Atypical atrial flutter     a. CL b. able to be pace  terminated   Past Surgical History  Procedure Laterality Date  . Cataract extraction      bilateral  . Rotator cuff repair      left  . Cardiac catheterization  2003    normal, left ventricular function (done after an abnormal cardiolite)  . Tumor removal      off arms  . Permanent pacemaker insertion N/A 06/07/2012    STJ Assurity dual chamber pacemaker implanted by Dr [name] for SSS    Current Outpatient Prescriptions  Medication Sig Dispense Refill  . albuterol (VENTOLIN HFA) 108 (90 BASE) MCG/ACT inhaler Inhale 2 puffs into the lungs every 4 (four) hours as needed. For shortness of breath    . amitriptyline (ELAVIL) 25 MG tablet Take 1 tablet (25 mg total) by mouth at bedtime. 30 tablet 1  . Azelastine-Fluticasone 137-50 MCG/ACT SUSP Place 2 sprays into the nose 2 (two) times daily as needed.     . cetaphil (CETAPHIL) lotion Apply 1 application topically as needed for dry skin.    Marland Kitchen diclofenac sodium (VOLTAREN) 1 % GEL Place onto the skin daily as needed. 4 TIMES DAILY AS NEEDED    . donepezil (ARICEPT) 10 MG tablet Take 10 mg by mouth at bedtime.      Marland Kitchen doxazosin (CARDURA) 2 MG tablet Take 1 tablet (2 mg total) by mouth at bedtime. 30 tablet 0  . ferrous sulfate 325 (65 FE)  MG tablet Take 325 mg by mouth daily with breakfast.      . fluticasone (FLONASE) 50 MCG/ACT nasal spray Place 2 sprays into both nostrils daily.    . Fluticasone-Salmeterol (ADVAIR) 100-50 MCG/DOSE AEPB Inhale 1 puff into the lungs 2 (two) times daily. Rinse mouth and spit after each use    . Foot Care Products (PREVALON HEEL PROTECTOR) MISC Apply the boot to the left foot to protect the heel at night and during the day when patient is in his chair 1 each 0  . gabapentin (NEURONTIN) 100 MG capsule Take 100 mg by mouth 3 (three) times daily.    Marland Kitchen. guaiFENesin (MUCINEX) 600 MG 12 hr tablet Take 600 mg by mouth 2 (two) times daily as needed.    Marland Kitchen. HYDROcodone-acetaminophen (NORCO/VICODIN) 5-325 MG per tablet Take  1-2 tablets by mouth every 6 (six) hours as needed for severe pain.    Marland Kitchen. insulin aspart (NOVOLOG) 100 UNIT/ML injection Inject into the skin 3 (three) times daily before meals. 10 units before breakfast and lunch , 12units before dinner    . insulin glargine (LANTUS) 100 UNIT/ML injection 22 units in the morning and 40 units at bedtime    . meloxicam (MOBIC) 7.5 MG tablet Take 7.5 mg by mouth daily.    . metoCLOPramide (REGLAN) 10 MG tablet Take 0.5 tablets (5 mg total) by mouth at bedtime. 30 tablet 11  . mirabegron ER (MYRBETRIQ) 50 MG TB24 tablet Take 50 mg by mouth daily.    . montelukast (SINGULAIR) 10 MG tablet Take 10 mg by mouth at bedtime.     . Multiple Vitamin (DAILY VITE) TABS Take by mouth daily.    Marland Kitchen. neomycin-bacitracin-polymyxin (NEOSPORIN) OINT Apply 1 application topically daily. To left big toe    . Olopatadine HCl (PATADAY) 0.2 % SOLN Apply 1 drop to eye daily.    Marland Kitchen. omeprazole (PRILOSEC) 40 MG capsule Take 40 mg by mouth daily.    . polyethylene glycol (MIRALAX / GLYCOLAX) packet Take 17 g by mouth as directed. Take on Mon.. Wed. and Fri.    . ranitidine (ZANTAC) 150 MG capsule Take 150 mg by mouth 2 (two) times daily.    . sennosides-docusate sodium (SENOKOT-S) 8.6-50 MG tablet Take 2 tablets by mouth at bedtime as needed. For constipation    . sertraline (ZOLOFT) 25 MG tablet Take 25 mg by mouth daily.      . simvastatin (ZOCOR) 20 MG tablet Take 20 mg by mouth at bedtime.      . tamsulosin (FLOMAX) 0.4 MG CAPS capsule Take 0.4 mg by mouth daily.    Marland Kitchen. torsemide (DEMADEX) 100 MG tablet Take 50 mg by mouth daily.      Marland Kitchen. trolamine salicylate (ASPERCREME) 10 % cream Apply 1 application topically as needed. To left shoulder    . vitamin C (ASCORBIC ACID) 500 MG tablet Take 500 mg by mouth daily.    . Vitamin D, Ergocalciferol, (DRISDOL) 50000 UNITS CAPS Take 50,000 Units by mouth every 7 (seven) days. Fridays    . warfarin (COUMADIN) 2 MG tablet Take as directed by the coumadin  clinic     No current facility-administered medications for this visit.    Allergies:   Neosporin original   Social History: History   Social History  . Marital Status: Widowed    Spouse Name: N/A  . Number of Children: 2  . Years of Education: N/A   Occupational History  . retired    Chief Executive Officerocial  History Main Topics  . Smoking status: Former Smoker    Types: Cigarettes    Quit date: 07/25/1971  . Smokeless tobacco: Former Neurosurgeon    Types: Chew  . Alcohol Use: No  . Drug Use: No  . Sexual Activity: Not on file   Other Topics Concern  . Not on file   Social History Narrative   Has lived in facility x aprox 4 years   Moved to [place] 08-2008   Does not have a POA   Does not have a living will    Family History: Family History  Problem Relation Age of Onset  . Colon cancer Neg Hx   . Prostate cancer Maternal Uncle   . Heart disease Paternal Uncle     x 3, paternal aunt     Review of Systems: All other systems reviewed and are otherwise negative except as noted above.   Physical Exam: VS:  BP 118/64 mmHg  Pulse 60  Ht 5' 10.5" (1.791 m)  Wt 283 lb 3.2 oz (128.459 kg)  BMI 40.05 kg/m2 , BMI Body mass index is 40.05 kg/(m^2).  GEN- The patient is morbidly obese appearing, alert and oriented x 3 today.   HEENT: normocephalic, atraumatic; sclera clear, conjunctiva pink; hearing intact; oropharynx clear; neck supple, no JVP Lymph- no cervical lymphadenopathy Lungs- Clear to ausculation bilaterally, normal work of breathing.  No wheezes, rales, rhonchi Heart- Regular rate and rhythm, no murmurs, rubs or gallops  GI- soft, non-tender, non-distended, bowel sounds present  Extremities- no clubbing, cyanosis, 1+ edema MS- no significant deformity or atrophy Skin- warm and dry, no rash or lesion; PPM pocket well healed Psych- euthymic mood, full affect Neuro- strength and sensation are intact  PPM Interrogation- reviewed in detail today,  See PACEART report  EKG:   EKG is ordered today. The ekg ordered today shows atrial pacing, RBBB, rate 60, QRS 148  Recent Labs: No results found for requested labs within last 365 days.   Wt Readings from Last 3 Encounters:  11/02/14 283 lb 3.2 oz (128.459 kg)  06/22/14 270 lb (122.471 kg)  03/12/14 274 lb (124.286 kg)     Other studies Reviewed: Additional studies/ records that were reviewed today include: Dr Jenel Lucks office notes  Assessment and Plan:  1.  Atrial flutter/atrial fibrillation Continue Warfarin for CHADS2VASC of 4 - followed by Dr Cyndia Bent. It is not clear how symptomatic his atrial arrhythmias are.  He states he has had increased shortess of breath for the last couple of months which corresponds with increased atrial flutter burden on device interrogation.  Not a candidate for Tikosyn or Sotalol with renal insufficiency. Discussed with Dr [name], with current sinus rhythm, will not add AAD therapy at this time.  Will see back in 6 weeks to reassess atrial arrhythmia burden  2.  Shortness of breath Likely multi-factorial +sleep disordered breathing and daytime somnolence. He carries a diagnosis of sleep apnea but has never worn CPAP Recommended repeat sleep study at this time - he is not willing to schedule at this time  2. Symptomatic bradycardia  Normal PPM function See Pace Art report No changes today  3.  HTN Stable No change required today   Current medicines are reviewed at length with the patient today.   The patient does not have concerns regarding his medicines.  The following changes were made today:  none  Labs/ tests ordered today include: none    Disposition:   Follow up with me in 6  weeks   Signed, Gypsy Balsam, NP 11/02/2014 10:05 AM  [facility] [address] [place] [address] [phone] (office) [phone] (fax)

## 2014-11-02 ENCOUNTER — Encounter: Payer: Self-pay | Admitting: Internal Medicine

## 2014-11-02 ENCOUNTER — Encounter: Payer: Self-pay | Admitting: Nurse Practitioner

## 2014-11-02 ENCOUNTER — Other Ambulatory Visit: Payer: Self-pay | Admitting: Internal Medicine

## 2014-11-02 ENCOUNTER — Ambulatory Visit (INDEPENDENT_AMBULATORY_CARE_PROVIDER_SITE_OTHER): Payer: Medicare Other | Admitting: Nurse Practitioner

## 2014-11-02 VITALS — BP 118/64 | HR 60 | Ht 70.5 in | Wt 283.2 lb

## 2014-11-02 DIAGNOSIS — I495 Sick sinus syndrome: Secondary | ICD-10-CM

## 2014-11-02 DIAGNOSIS — I480 Paroxysmal atrial fibrillation: Secondary | ICD-10-CM

## 2014-11-02 DIAGNOSIS — I484 Atypical atrial flutter: Secondary | ICD-10-CM | POA: Diagnosis not present

## 2014-11-02 NOTE — Patient Instructions (Signed)
Your physician recommends that you continue on your current medications as directed. Please refer to the Current Medication list given to you today.  Your physician recommends that you schedule a follow-up appointment in: 6 weeks with Gypsy BalsamAmber Seiler, NP

## 2014-11-16 LAB — MDC_IDC_ENUM_SESS_TYPE_INCLINIC
Lead Channel Setting Pacing Amplitude: 2 V
Lead Channel Setting Pacing Amplitude: 2.5 V
Lead Channel Setting Sensing Sensitivity: 2 mV
MDC IDC PG SERIAL: 7411677
MDC IDC SET LEADCHNL RV PACING PULSEWIDTH: 0.5 ms

## 2014-12-10 ENCOUNTER — Ambulatory Visit (INDEPENDENT_AMBULATORY_CARE_PROVIDER_SITE_OTHER): Payer: Medicare Other | Admitting: Podiatry

## 2014-12-10 DIAGNOSIS — B351 Tinea unguium: Secondary | ICD-10-CM | POA: Diagnosis not present

## 2014-12-10 DIAGNOSIS — M79673 Pain in unspecified foot: Secondary | ICD-10-CM

## 2014-12-10 DIAGNOSIS — E1140 Type 2 diabetes mellitus with diabetic neuropathy, unspecified: Secondary | ICD-10-CM | POA: Diagnosis not present

## 2014-12-10 NOTE — Progress Notes (Signed)
   Subjective: Patient presents today for followup of foot and nail care. The left foot swelling has decreased decreased significantly. The heel is still sore but no active ulceration is present. The ulceration on the distal tip of the left hallux appears to have healed from his treatment at the wound care center.  Objective: Pedal pulses continue to be palpable DP and PT at 1/4 bilateral. Temperature is warm to warm. Patient's toenails are elongated, thickened and mycotic. The left heel is soft and does not have any sign of breakdown. Distal tip of the left hallux has a hyperkeratotic lesion present it does not appear to be infected.  Assessment: Symptomatic mycotic toenails, healing pressure sore left heel, healed ulcer left great toe  Plan: Debrided the toenails without complication recommended elevation of his heel off of the supporting surface as much as possible to allow the left heel to improve. he'll be seen in 3 months or sooner if questions or concerns arise..Marland Kitchen

## 2014-12-14 ENCOUNTER — Ambulatory Visit: Payer: Medicare Other | Admitting: Nurse Practitioner

## 2014-12-25 NOTE — Progress Notes (Signed)
Electrophysiology Office Note Date: 12/28/2014  ID:  Tyler [name], Tyler [name] Jul 27, 1933, MRN [id number]  PCP: [name], MD Electrophysiologist: Allred  CC: follow up atrial arrhythmias   Tyler [name] is a 81 y.o. male is seen today for Dr [name]. He was seen last month after pacemaker remote transmission demonstrated significant increase in atrial arrhythmia burden with increased shortness of breath.  He has a long standing history of atrial fibrillation and atrial flutter.  He reports no increased shortness of breath or LE edema recently.  He denies chest pain, nausea, vomiting, dizziness, syncope.  He is wheelchair bound and does not ambulate much due to foot ulcers.   Last echo 05/2012 demonstrated EF 60-65%, no RWMA, grade 2 diastolic dysfunction, mildly dilated LA.  Device History: STJ dual chamber PPM implanted 2013 for sick sinus syndrome   Past Medical History  Diagnosis Date  . Diabetes mellitus    . Hyperlipidemia    . Hypertension    . Asthma   . Allergic rhinitis   . GERD (gastroesophageal reflux disease)   . BPH (benign prostatic hypertrophy)     s/p thermo therapy, has bladdero utlet obst. and instablitiy, incontient  . Anemia   . Chronic constipation   . Sick sinus syndrome     a. s/p STJ dual chamber pacemaker  . Paroxysmal atrial fibrillation     a. status post direct cardioversion b. anticoagulated with Warfarin  . Obesity   . Sleep apnea     noncomplicance w/ CPAP  . Gastroparesis     dx in 2006 through gastric scan  . Gynecomastia     nomral mammograms 11/2008  . Chronic kidney disease   . Globus sensation     2012 EGD, Ba swallow, both negative.  MBSS 2012 negative for dysphagia.   Marland Kitchen Atypical atrial flutter     a. CL b. able to be pace terminated   Past Surgical History  Procedure Laterality Date  . Cataract extraction      bilateral  . Rotator cuff repair      left  . Cardiac catheterization  2003    normal, left  ventricular function (done after an abnormal cardiolite)  . Tumor removal      off arms  . Permanent pacemaker insertion N/A 06/07/2012    STJ Assurity dual chamber pacemaker implanted by Dr [name] for SSS    Current Outpatient Prescriptions  Medication Sig Dispense Refill  . albuterol (VENTOLIN HFA) 108 (90 BASE) MCG/ACT inhaler Inhale 2 puffs into the lungs every 4 (four) hours as needed. For shortness of breath    . amitriptyline (ELAVIL) 25 MG tablet Take 1 tablet (25 mg total) by mouth at bedtime. 30 tablet 1  . Azelastine-Fluticasone 137-50 MCG/ACT SUSP Place 2 sprays into the nose 2 (two) times daily as needed.     . cetaphil (CETAPHIL) lotion Apply 1 application topically as needed for dry skin.    Marland Kitchen diclofenac sodium (VOLTAREN) 1 % GEL Place onto the skin daily as needed. 4 TIMES DAILY AS NEEDED    . donepezil (ARICEPT) 10 MG tablet Take 10 mg by mouth at bedtime.      Marland Kitchen doxazosin (CARDURA) 2 MG tablet Take 1 tablet (2 mg total) by mouth at bedtime. 30 tablet 0  . ferrous sulfate 325 (65 FE) MG tablet Take 325 mg by mouth daily with breakfast.      . fluticasone (FLONASE) 50 MCG/ACT nasal spray Place 2 sprays into  both nostrils daily.    . Fluticasone-Salmeterol (ADVAIR) 100-50 MCG/DOSE AEPB Inhale 1 puff into the lungs 2 (two) times daily. Rinse mouth and spit after each use    . Foot Care Products (PREVALON HEEL PROTECTOR) MISC Apply the boot to the left foot to protect the heel at night and during the day when patient is in his chair 1 each 0  . gabapentin (NEURONTIN) 100 MG capsule Take 100 mg by mouth 3 (three) times daily.    Marland Kitchen guaiFENesin (MUCINEX) 600 MG 12 hr tablet Take 600 mg by mouth 2 (two) times daily as needed.    Marland Kitchen HYDROcodone-acetaminophen (NORCO/VICODIN) 5-325 MG per tablet Take 1-2 tablets by mouth every 6 (six) hours as needed for severe pain.    Marland Kitchen insulin aspart (NOVOLOG) 100 UNIT/ML injection Inject into the skin 3 (three) times daily before meals. 10 units  before breakfast and lunch , 12units before dinner    . insulin glargine (LANTUS) 100 UNIT/ML injection 22 units in the morning and 40 units at bedtime    . meloxicam (MOBIC) 7.5 MG tablet Take 7.5 mg by mouth daily.    . metoCLOPramide (REGLAN) 10 MG tablet Take 0.5 tablets (5 mg total) by mouth at bedtime. 30 tablet 11  . mirabegron ER (MYRBETRIQ) 50 MG TB24 tablet Take 50 mg by mouth daily.    . montelukast (SINGULAIR) 10 MG tablet Take 10 mg by mouth at bedtime.     . Multiple Vitamin (DAILY VITE) TABS Take by mouth daily.    Marland Kitchen neomycin-bacitracin-polymyxin (NEOSPORIN) OINT Apply 1 application topically daily. To left big toe    . Olopatadine HCl (PATADAY) 0.2 % SOLN Apply 1 drop to eye daily.    Marland Kitchen omeprazole (PRILOSEC) 40 MG capsule Take 40 mg by mouth daily.    . polyethylene glycol (MIRALAX / GLYCOLAX) packet Take 17 g by mouth as directed. Take on Mon.. Wed. and Fri.    . ranitidine (ZANTAC) 150 MG capsule Take 150 mg by mouth 2 (two) times daily.    . sennosides-docusate sodium (SENOKOT-S) 8.6-50 MG tablet Take 2 tablets by mouth at bedtime as needed. For constipation    . sertraline (ZOLOFT) 25 MG tablet Take 25 mg by mouth daily.      . simvastatin (ZOCOR) 20 MG tablet Take 20 mg by mouth at bedtime.      . tamsulosin (FLOMAX) 0.4 MG CAPS capsule Take 0.4 mg by mouth daily.    Marland Kitchen torsemide (DEMADEX) 100 MG tablet Take 50 mg by mouth daily.      Marland Kitchen trolamine salicylate (ASPERCREME) 10 % cream Apply 1 application topically as needed. To left shoulder    . vitamin C (ASCORBIC ACID) 500 MG tablet Take 500 mg by mouth daily.    . Vitamin D, Ergocalciferol, (DRISDOL) 50000 UNITS CAPS Take 50,000 Units by mouth every 7 (seven) days. Fridays    . warfarin (COUMADIN) 2 MG tablet Take as directed by the coumadin clinic     No current facility-administered medications for this visit.    Allergies:   Neosporin original   Social History: History   Social History  . Marital Status: Widowed      Spouse Name: N/A  . Number of Children: 2  . Years of Education: N/A   Occupational History  . retired    Social History Main Topics  . Smoking status: Former Smoker    Types: Cigarettes    Quit date: 07/25/1971  . Smokeless tobacco: Former  User    Types: Chew  . Alcohol Use: No  . Drug Use: No  . Sexual Activity: Not on file   Other Topics Concern  . Not on file   Social History Narrative   Has lived in facility x aprox 4 years   Moved to [place] 08-2008   Does not have a POA   Does not have a living will    Family History: Family History  Problem Relation Age of Onset  . Colon cancer Neg Hx   . Prostate cancer Maternal Uncle   . Heart disease Paternal Uncle     x 3, paternal aunt     Review of Systems: All other systems reviewed and are otherwise negative except as noted above.   Physical Exam: VS:  BP 106/74 mmHg  Pulse 103  Ht 5' 10.5" (1.791 m)  Wt 275 lb 6.4 oz (124.921 kg)  BMI 38.94 kg/m2 , BMI Body mass index is 38.94 kg/(m^2).  GEN- The patient is morbidly obese, wheelchair bound appearing, alert and oriented x 3 today.   HEENT: normocephalic, atraumatic; sclera clear, conjunctiva pink; hearing intact; oropharynx clear; neck supple, no JVP Lymph- no cervical lymphadenopathy Lungs- Clear to ausculation bilaterally, normal work of breathing.  No wheezes, rales, rhonchi Heart- Tachycardic regular rate and rhythm  GI- soft, non-tender, non-distended, bowel sounds present  Extremities- no clubbing, cyanosis, 1+ edema MS- no significant deformity or atrophy Skin- warm and dry, no rash or lesion; PPM pocket well healed Psych- euthymic mood, full affect Neuro- strength and sensation are intact  PPM Interrogation- reviewed in detail today,  See PACEART report  EKG:  EKG is ordered today and demonstrates atypical atrial flutter, ventricular rate 103, RBBB  Recent Labs: No results found for requested labs within last 365 days.   Wt Readings from  Last 3 Encounters:  12/28/14 275 lb 6.4 oz (124.921 kg)  11/02/14 283 lb 3.2 oz (128.459 kg)  06/22/14 270 lb (122.471 kg)     Other studies Reviewed: Additional studies/ records that were reviewed today include: Dr Jenel LucksAllred's office notes  Assessment and Plan:  1.  Atrial flutter/atrial fibrillation Continue Warfarin for CHADS2VASC of 4 - followed by Dr Cyndia BentBadger. AF/flutter burden 26 % sine last office visit.   Not a candidate for Tikosyn or Sotalol with renal insufficiency. Discussed with Dr Johney FrameAllred - with few symptoms, obesity, and decreased functional capacity, he is not a candidate for EP procedures at this time.   2.  Shortness of breath Likely multi-factorial +sleep disordered breathing and daytime somnolence. He carries a diagnosis of sleep apnea but has never worn CPAP Recommended repeat sleep study at this time - he is not willing to schedule at this time  2. Symptomatic bradycardia  Normal PPM function See Pace Art report No changes today  3.  HTN Stable No change required today   Current medicines are reviewed at length with the patient today.   The patient does not have concerns regarding his medicines.  The following changes were made today:  none  Labs/ tests ordered today include: none    Disposition:   Follow up with me in 6 months, Merlin in 3 months   Signed, Gypsy BalsamAmber Verl Whitmore, NP 12/28/2014 9:56 AM  [facility] [address] [place] [address] [phone] (office) [phone] (fax)

## 2014-12-28 ENCOUNTER — Other Ambulatory Visit: Payer: Self-pay | Admitting: Internal Medicine

## 2014-12-28 ENCOUNTER — Encounter: Payer: Self-pay | Admitting: Nurse Practitioner

## 2014-12-28 ENCOUNTER — Ambulatory Visit (INDEPENDENT_AMBULATORY_CARE_PROVIDER_SITE_OTHER): Payer: Medicare Other | Admitting: Nurse Practitioner

## 2014-12-28 VITALS — BP 106/74 | HR 103 | Ht 70.5 in | Wt 275.4 lb

## 2014-12-28 DIAGNOSIS — I484 Atypical atrial flutter: Secondary | ICD-10-CM

## 2014-12-28 DIAGNOSIS — I495 Sick sinus syndrome: Secondary | ICD-10-CM | POA: Diagnosis not present

## 2014-12-28 DIAGNOSIS — I481 Persistent atrial fibrillation: Secondary | ICD-10-CM

## 2014-12-28 DIAGNOSIS — I4819 Other persistent atrial fibrillation: Secondary | ICD-10-CM

## 2014-12-28 DIAGNOSIS — I452 Bifascicular block: Secondary | ICD-10-CM

## 2014-12-28 LAB — CUP PACEART INCLINIC DEVICE CHECK
Brady Statistic RV Percent Paced: 2.5 %
Date Time Interrogation Session: 20160606100155
Lead Channel Pacing Threshold Amplitude: 0.5 V
Lead Channel Pacing Threshold Pulse Width: 0.5 ms
Lead Channel Sensing Intrinsic Amplitude: 2.6 mV
Lead Channel Sensing Intrinsic Amplitude: 7.7 mV
MDC IDC MSMT LEADCHNL RA IMPEDANCE VALUE: 530 Ohm
MDC IDC MSMT LEADCHNL RV IMPEDANCE VALUE: 600 Ohm
MDC IDC PG SERIAL: 7411677
MDC IDC SET LEADCHNL RA PACING AMPLITUDE: 2 V
MDC IDC SET LEADCHNL RV PACING AMPLITUDE: 2.5 V
MDC IDC SET LEADCHNL RV PACING PULSEWIDTH: 0.5 ms
MDC IDC SET LEADCHNL RV SENSING SENSITIVITY: 2 mV
MDC IDC STAT BRADY RA PERCENT PACED: 54 %
Pulse Gen Model: 2210

## 2014-12-28 MED ORDER — METOPROLOL TARTRATE 25 MG PO TABS: 25.0000 mg | ORAL_TABLET | Freq: Two times a day (BID) | ORAL | Status: DC

## 2014-12-28 NOTE — Patient Instructions (Signed)
Medication Instructions:  Your physician has recommended you make the following change in your medication:  1) Start Metoprolol 25 mg twice daily   Labwork: None ordered  Testing/Procedures: None ordered  Follow-Up: Your physician wants you to follow-up in: 6 months with Tyler BalsamAmber Seiler, NP You will receive a reminder letter in the mail two months in advance. If you don't receive a letter, please call our office to schedule the follow-up appointment.   Any Other Special Instructions Will Be Listed Below (If Applicable).

## 2015-01-01 ENCOUNTER — Encounter: Payer: Self-pay | Admitting: Internal Medicine

## 2015-01-06 ENCOUNTER — Telehealth: Payer: Self-pay | Admitting: Cardiology

## 2015-01-06 ENCOUNTER — Encounter: Payer: Medicare Other | Admitting: *Deleted

## 2015-01-06 NOTE — Telephone Encounter (Signed)
LMOVM reminding pt to send remote transmission.   

## 2015-01-07 ENCOUNTER — Encounter: Payer: Self-pay | Admitting: Cardiology

## 2015-01-11 ENCOUNTER — Ambulatory Visit (INDEPENDENT_AMBULATORY_CARE_PROVIDER_SITE_OTHER): Payer: Medicare Other | Admitting: *Deleted

## 2015-01-11 DIAGNOSIS — I495 Sick sinus syndrome: Secondary | ICD-10-CM

## 2015-01-11 NOTE — Progress Notes (Signed)
Remote pacemaker transmission.   

## 2015-01-12 LAB — CUP PACEART REMOTE DEVICE CHECK
Battery Remaining Longevity: 93 mo
Battery Remaining Percentage: 81 %
Battery Voltage: 2.93 V
Brady Statistic AP VS Percent: 73 %
Brady Statistic AS VP Percent: 1.9 %
Brady Statistic RA Percent Paced: 43 %
Lead Channel Impedance Value: 560 Ohm
Lead Channel Pacing Threshold Pulse Width: 0.5 ms
Lead Channel Sensing Intrinsic Amplitude: 2.6 mV
Lead Channel Sensing Intrinsic Amplitude: 7.9 mV
Lead Channel Setting Pacing Amplitude: 2.5 V
Lead Channel Setting Pacing Pulse Width: 0.5 ms
Lead Channel Setting Sensing Sensitivity: 2 mV
MDC IDC MSMT LEADCHNL RA IMPEDANCE VALUE: 430 Ohm
MDC IDC MSMT LEADCHNL RA PACING THRESHOLD AMPLITUDE: 0.5 V
MDC IDC MSMT LEADCHNL RA PACING THRESHOLD PULSEWIDTH: 0.5 ms
MDC IDC MSMT LEADCHNL RV PACING THRESHOLD AMPLITUDE: 0.5 V
MDC IDC SESS DTM: 20160620070810
MDC IDC SET LEADCHNL RA PACING AMPLITUDE: 2 V
MDC IDC STAT BRADY AP VP PERCENT: 2.7 %
MDC IDC STAT BRADY AS VS PERCENT: 19 %
MDC IDC STAT BRADY RV PERCENT PACED: 6.6 %
Pulse Gen Model: 2210
Pulse Gen Serial Number: 7411677

## 2015-01-14 ENCOUNTER — Encounter: Payer: Self-pay | Admitting: Cardiology

## 2015-01-20 ENCOUNTER — Encounter: Payer: Self-pay | Admitting: Internal Medicine

## 2015-02-18 ENCOUNTER — Ambulatory Visit (INDEPENDENT_AMBULATORY_CARE_PROVIDER_SITE_OTHER): Payer: Medicare Other

## 2015-02-18 ENCOUNTER — Encounter: Payer: Self-pay | Admitting: Podiatry

## 2015-02-18 ENCOUNTER — Ambulatory Visit (INDEPENDENT_AMBULATORY_CARE_PROVIDER_SITE_OTHER): Payer: Medicare Other | Admitting: Podiatry

## 2015-02-18 VITALS — BP 142/86 | HR 69 | Resp 12

## 2015-02-18 DIAGNOSIS — L97521 Non-pressure chronic ulcer of other part of left foot limited to breakdown of skin: Secondary | ICD-10-CM

## 2015-02-18 DIAGNOSIS — R52 Pain, unspecified: Secondary | ICD-10-CM

## 2015-02-18 DIAGNOSIS — E1140 Type 2 diabetes mellitus with diabetic neuropathy, unspecified: Secondary | ICD-10-CM

## 2015-02-18 DIAGNOSIS — L97421 Non-pressure chronic ulcer of left heel and midfoot limited to breakdown of skin: Secondary | ICD-10-CM | POA: Diagnosis not present

## 2015-02-18 MED ORDER — AMOXICILLIN-POT CLAVULANATE 875-125 MG PO TABS: 1.0000 | ORAL_TABLET | Freq: Two times a day (BID) | ORAL | Status: DC

## 2015-02-19 NOTE — Progress Notes (Signed)
Subjective:     Patient ID: Tyler [name], male   DOB: 1934-04-03, 81 y.o.   MRN: [id number]  HPIThis patient presents to the office with draining ulcer on the tip of his left big toe.  This patient is a resident of [facility] and is diabetic taking novolog.   Patient says this started when Dr. Irving Shows provided treatment and removed his big toe nail left foot.  He says the skin broke down following the nail removal.  He was seen at the wound care for his wound care.  He presents to the office saying his toe has broken down and drainage is present.  He presents for evaluation and treatment.   Review of Systems     Objective:   Physical Exam GENERAL APPEARANCE: Alert, conversant. Appropriately groomed. No acute distress.  VASCULAR: Pedal pulses  DP and PT are palpablet . bilateral.  Capillary refill time is immediate to all digits,    NEUROLOGIC: sensation is diminished epicritically and protectively to 5.07 monofilament at 5/5 sites bilateral.    MUSCULOSKELETAL: acceptable muscle strength, tone and stability bilateral.  Intrinsic muscluature intact bilateral.  Rectus appearance of foot and digits noted bilateral.   DERMATOLOGIC: There is a 5 mm. X 7 mm. Ulcer on distal tip of left hallux.  Masceration of skin noted dorsally at the dorsal aspect nail bed.No malodor noted.  Granulation tissue noted in ulcerated area.  No infection  Or pus noted at ulcer site.      Assessment:     Diabetic Ulcer left Hallux    Plan:     Debridement of Necrotic tissue down .  Neosporin/DSD  X-rays taken. Xrays reveal changes to the distal phalanx possibly an old fracture.  There appears to be possible cystic lesion distal phalanx.  Home instructions with soaks and bandaging.  Augmentin 875 mg  #20  One BID.  RTC 2 weeks

## 2015-03-01 ENCOUNTER — Ambulatory Visit (INDEPENDENT_AMBULATORY_CARE_PROVIDER_SITE_OTHER): Payer: Medicare Other | Admitting: Podiatry

## 2015-03-01 ENCOUNTER — Encounter: Payer: Self-pay | Admitting: Podiatry

## 2015-03-01 VITALS — BP 129/72 | HR 96 | Resp 12

## 2015-03-01 DIAGNOSIS — E1140 Type 2 diabetes mellitus with diabetic neuropathy, unspecified: Secondary | ICD-10-CM | POA: Diagnosis not present

## 2015-03-01 DIAGNOSIS — L84 Corns and callosities: Secondary | ICD-10-CM | POA: Diagnosis not present

## 2015-03-01 DIAGNOSIS — L97521 Non-pressure chronic ulcer of other part of left foot limited to breakdown of skin: Secondary | ICD-10-CM | POA: Diagnosis not present

## 2015-03-02 ENCOUNTER — Encounter: Payer: Self-pay | Admitting: Podiatry

## 2015-03-02 NOTE — Progress Notes (Signed)
Patient ID: Tyler [name], male   DOB: 1934/06/19, 81 y.o.   MRN: [id number]  Subjective: 81 year old male presents the office today follow-up evaluation of left hallux ulceration. States he gets some intermittent discomfort to the big toe. He has been changing the dressing daily with Neosporin and a Band-Aid. He has finished his course of antibiotics which are prescribed last appointment. He denies any drainage or pus coming from the wound and denies any redness, swelling. He has a states he has pain to his left heel. He has been floating his heels on pillows. No other complaints at this time. Denies any systemic complaints as fevers, chills, nausea, vomiting.  Objective: AAO 3, NAD DP/PT pulses palpable, CRT less than 3 seconds  Protective sensation decreased with Simms Weinstein monofilament At the distal aspect of the left hallux there is a small superficial ulceration with hyperkeratotic periwound. After debridement the wound measures possible 0.3 x 0.2 cm. There is no probing to bone, undermining, tunneling. Is no surrounding erythema, ascending saline disc, fluctuance, crepitus, malodor, drainage/purulence. There is blanchable erythema to the left heel due to pressure. There is no open lesion at this time. No other open lesions or pre-ulcer lesions identified bilaterally There is no pain with calf compression, swelling, warmth, erythema.  Assessment: Left hallux ulceration, left heel pre-ulcerative lesion  Plan: -Treatment options discussed including all alternatives, risks, and complications -Left hallux ulceration was sharply debrided without complications to healthy, bleeding, granular tissue. Iodosorb was applied followed by dry sterile dressing. Continue daily dressing changes. Dispensed surgical shoe to help take pressure off the hallux. Monitor for any clinical signs or symptoms of infection and directed to call the office immediately should any occur or go to the ER. -Continue  to monitor left heel daily. Continue to float the heels on pillows while in bed. -Follow-up 2-3 weeks or sooner if any problems arise. In the meantime, encouraged to call the office with any questions, concerns, change in symptoms.   Ovid Curd, DPM

## 2015-03-15 ENCOUNTER — Encounter (HOSPITAL_COMMUNITY): Payer: Self-pay | Admitting: *Deleted

## 2015-03-15 ENCOUNTER — Emergency Department (HOSPITAL_COMMUNITY): Payer: Medicare Other

## 2015-03-15 ENCOUNTER — Inpatient Hospital Stay (HOSPITAL_COMMUNITY)
Admission: EM | Admit: 2015-03-15 | Discharge: 2015-03-17 | DRG: 292 | Disposition: A | Discharge status: Skilled Nursing Facility | Payer: Medicare Other | Attending: Family Medicine | Admitting: Family Medicine

## 2015-03-15 DIAGNOSIS — E785 Hyperlipidemia, unspecified: Secondary | ICD-10-CM | POA: Diagnosis not present

## 2015-03-15 DIAGNOSIS — Z9861 Coronary angioplasty status: Secondary | ICD-10-CM | POA: Diagnosis not present

## 2015-03-15 DIAGNOSIS — I452 Bifascicular block: Secondary | ICD-10-CM | POA: Diagnosis present

## 2015-03-15 DIAGNOSIS — I517 Cardiomegaly: Secondary | ICD-10-CM | POA: Diagnosis not present

## 2015-03-15 DIAGNOSIS — Z9119 Patient's noncompliance with other medical treatment and regimen: Secondary | ICD-10-CM | POA: Diagnosis present

## 2015-03-15 DIAGNOSIS — Z794 Long term (current) use of insulin: Secondary | ICD-10-CM

## 2015-03-15 DIAGNOSIS — I10 Essential (primary) hypertension: Secondary | ICD-10-CM | POA: Diagnosis not present

## 2015-03-15 DIAGNOSIS — E669 Obesity, unspecified: Secondary | ICD-10-CM | POA: Diagnosis present

## 2015-03-15 DIAGNOSIS — Z7901 Long term (current) use of anticoagulants: Secondary | ICD-10-CM

## 2015-03-15 DIAGNOSIS — E119 Type 2 diabetes mellitus without complications: Secondary | ICD-10-CM | POA: Diagnosis not present

## 2015-03-15 DIAGNOSIS — L97529 Non-pressure chronic ulcer of other part of left foot with unspecified severity: Secondary | ICD-10-CM | POA: Diagnosis present

## 2015-03-15 DIAGNOSIS — Z6841 Body Mass Index (BMI) 40.0 and over, adult: Secondary | ICD-10-CM | POA: Diagnosis not present

## 2015-03-15 DIAGNOSIS — Z79899 Other long term (current) drug therapy: Secondary | ICD-10-CM

## 2015-03-15 DIAGNOSIS — Z950 Presence of cardiac pacemaker: Secondary | ICD-10-CM

## 2015-03-15 DIAGNOSIS — N183 Chronic kidney disease, stage 3 unspecified: Secondary | ICD-10-CM | POA: Diagnosis present

## 2015-03-15 DIAGNOSIS — R0602 Shortness of breath: Secondary | ICD-10-CM

## 2015-03-15 DIAGNOSIS — I739 Peripheral vascular disease, unspecified: Secondary | ICD-10-CM | POA: Diagnosis present

## 2015-03-15 DIAGNOSIS — E1121 Type 2 diabetes mellitus with diabetic nephropathy: Secondary | ICD-10-CM | POA: Diagnosis present

## 2015-03-15 DIAGNOSIS — Z881 Allergy status to other antibiotic agents status: Secondary | ICD-10-CM | POA: Diagnosis not present

## 2015-03-15 DIAGNOSIS — Z87891 Personal history of nicotine dependence: Secondary | ICD-10-CM

## 2015-03-15 DIAGNOSIS — D649 Anemia, unspecified: Secondary | ICD-10-CM | POA: Diagnosis present

## 2015-03-15 DIAGNOSIS — E1152 Type 2 diabetes mellitus with diabetic peripheral angiopathy with gangrene: Secondary | ICD-10-CM | POA: Diagnosis not present

## 2015-03-15 DIAGNOSIS — I253 Aneurysm of heart: Secondary | ICD-10-CM | POA: Diagnosis not present

## 2015-03-15 DIAGNOSIS — R109 Unspecified abdominal pain: Secondary | ICD-10-CM

## 2015-03-15 DIAGNOSIS — R531 Weakness: Secondary | ICD-10-CM

## 2015-03-15 DIAGNOSIS — E875 Hyperkalemia: Secondary | ICD-10-CM | POA: Diagnosis present

## 2015-03-15 DIAGNOSIS — J45909 Unspecified asthma, uncomplicated: Secondary | ICD-10-CM | POA: Diagnosis present

## 2015-03-15 DIAGNOSIS — R0902 Hypoxemia: Secondary | ICD-10-CM | POA: Diagnosis present

## 2015-03-15 DIAGNOSIS — N390 Urinary tract infection, site not specified: Secondary | ICD-10-CM | POA: Diagnosis present

## 2015-03-15 DIAGNOSIS — I129 Hypertensive chronic kidney disease with stage 1 through stage 4 chronic kidney disease, or unspecified chronic kidney disease: Secondary | ICD-10-CM | POA: Diagnosis present

## 2015-03-15 DIAGNOSIS — Z7951 Long term (current) use of inhaled steroids: Secondary | ICD-10-CM | POA: Diagnosis not present

## 2015-03-15 DIAGNOSIS — N400 Enlarged prostate without lower urinary tract symptoms: Secondary | ICD-10-CM | POA: Diagnosis present

## 2015-03-15 DIAGNOSIS — K219 Gastro-esophageal reflux disease without esophagitis: Secondary | ICD-10-CM | POA: Diagnosis present

## 2015-03-15 DIAGNOSIS — G4733 Obstructive sleep apnea (adult) (pediatric): Secondary | ICD-10-CM | POA: Diagnosis present

## 2015-03-15 DIAGNOSIS — I480 Paroxysmal atrial fibrillation: Secondary | ICD-10-CM | POA: Diagnosis present

## 2015-03-15 DIAGNOSIS — R140 Abdominal distension (gaseous): Secondary | ICD-10-CM | POA: Diagnosis present

## 2015-03-15 DIAGNOSIS — I509 Heart failure, unspecified: Secondary | ICD-10-CM

## 2015-03-15 DIAGNOSIS — I5033 Acute on chronic diastolic (congestive) heart failure: Secondary | ICD-10-CM | POA: Diagnosis present

## 2015-03-15 DIAGNOSIS — I5030 Unspecified diastolic (congestive) heart failure: Secondary | ICD-10-CM | POA: Diagnosis not present

## 2015-03-15 DIAGNOSIS — I4892 Unspecified atrial flutter: Secondary | ICD-10-CM | POA: Diagnosis present

## 2015-03-15 LAB — PROTIME-INR
INR: 2.37 — ABNORMAL HIGH (ref 0.00–1.49)
PROTHROMBIN TIME: 25.6 s — AB (ref 11.6–15.2)

## 2015-03-15 LAB — BASIC METABOLIC PANEL
ANION GAP: 9 (ref 5–15)
BUN: 32 mg/dL — AB (ref 6–20)
CALCIUM: 8.6 mg/dL — AB (ref 8.9–10.3)
CO2: 27 mmol/L (ref 22–32)
CREATININE: 2.05 mg/dL — AB (ref 0.61–1.24)
Chloride: 106 mmol/L (ref 101–111)
GFR calc Af Amer: 34 mL/min — ABNORMAL LOW (ref >60)
GFR calc non Af Amer: 29 mL/min — ABNORMAL LOW (ref >60)
GLUCOSE: 48 mg/dL — AB (ref 65–99)
Potassium: 5.7 mmol/L — ABNORMAL HIGH (ref 3.5–5.1)
Sodium: 142 mmol/L (ref 135–145)

## 2015-03-15 LAB — CBC
HCT: 31.9 % — ABNORMAL LOW (ref 39.0–52.0)
HEMOGLOBIN: 10.4 g/dL — AB (ref 13.0–17.0)
MCH: 29.5 pg (ref 26.0–34.0)
MCHC: 32.6 g/dL (ref 30.0–36.0)
MCV: 90.4 fL (ref 78.0–100.0)
Platelets: 177 10*3/uL (ref 150–400)
RBC: 3.53 MIL/uL — ABNORMAL LOW (ref 4.22–5.81)
RDW: 13.2 % (ref 11.5–15.5)
WBC: 8.2 10*3/uL (ref 4.0–10.5)

## 2015-03-15 LAB — HEPATIC FUNCTION PANEL
ALBUMIN: 3.2 g/dL — AB (ref 3.5–5.0)
ALT: 20 U/L (ref 17–63)
AST: 65 U/L — AB (ref 15–41)
Alkaline Phosphatase: 93 U/L (ref 38–126)
BILIRUBIN TOTAL: 1.8 mg/dL — AB (ref 0.3–1.2)
Bilirubin, Direct: 0.7 mg/dL — ABNORMAL HIGH (ref 0.1–0.5)
Indirect Bilirubin: 1.1 mg/dL — ABNORMAL HIGH (ref 0.3–0.9)
Total Protein: 6.5 g/dL (ref 6.5–8.1)

## 2015-03-15 LAB — URINALYSIS, ROUTINE W REFLEX MICROSCOPIC
BILIRUBIN URINE: NEGATIVE
GLUCOSE, UA: NEGATIVE mg/dL
Ketones, ur: NEGATIVE mg/dL
NITRITE: NEGATIVE
PH: 5 (ref 5.0–8.0)
Protein, ur: NEGATIVE mg/dL
SPECIFIC GRAVITY, URINE: 1.011 (ref 1.005–1.030)
Urobilinogen, UA: 1 mg/dL (ref 0.0–1.0)

## 2015-03-15 LAB — I-STAT TROPONIN, ED: TROPONIN I, POC: 0.02 ng/mL (ref 0.00–0.08)

## 2015-03-15 LAB — AMMONIA: Ammonia: 68 umol/L — ABNORMAL HIGH (ref 9–35)

## 2015-03-15 LAB — URINE MICROSCOPIC-ADD ON

## 2015-03-15 LAB — GLUCOSE, CAPILLARY: Glucose-Capillary: 144 mg/dL — ABNORMAL HIGH (ref 65–99)

## 2015-03-15 LAB — BRAIN NATRIURETIC PEPTIDE: B Natriuretic Peptide: 195.8 pg/mL — ABNORMAL HIGH (ref 0.0–100.0)

## 2015-03-15 LAB — CBG MONITORING, ED
GLUCOSE-CAPILLARY: 41 mg/dL — AB (ref 65–99)
Glucose-Capillary: 177 mg/dL — ABNORMAL HIGH (ref 65–99)

## 2015-03-15 MED ORDER — ALBUTEROL SULFATE (2.5 MG/3ML) 0.083% IN NEBU: 3.0000 mL | INHALATION_SOLUTION | RESPIRATORY_TRACT | Status: DC | PRN

## 2015-03-15 MED ORDER — OLOPATADINE HCL 0.1 % OP SOLN
1.0000 [drp] | Freq: Two times a day (BID) | OPHTHALMIC | Status: DC
  Administered 2015-03-16 – 2015-03-17 (×4): 1 [drp] via OPHTHALMIC
  Filled 2015-03-15: qty 5

## 2015-03-15 MED ORDER — ACETAMINOPHEN 650 MG RE SUPP
650.0000 mg | Freq: Four times a day (QID) | RECTAL | Status: DC | PRN
  Filled 2015-03-15: qty 1

## 2015-03-15 MED ORDER — HEPARIN SODIUM (PORCINE) 5000 UNIT/ML IJ SOLN
5000.0000 [IU] | Freq: Three times a day (TID) | INTRAMUSCULAR | Status: DC
  Administered 2015-03-16: 5000 [IU] via SUBCUTANEOUS
  Filled 2015-03-15: qty 1

## 2015-03-15 MED ORDER — NITROGLYCERIN 2 % TD OINT
0.5000 [in_us] | TOPICAL_OINTMENT | Freq: Four times a day (QID) | TRANSDERMAL | Status: DC
  Administered 2015-03-15: 0.5 [in_us] via TOPICAL
  Filled 2015-03-15: qty 1

## 2015-03-15 MED ORDER — SODIUM CHLORIDE 0.9 % IJ SOLN
3.0000 mL | Freq: Two times a day (BID) | INTRAMUSCULAR | Status: DC
  Administered 2015-03-17: 3 mL via INTRAVENOUS

## 2015-03-15 MED ORDER — TAMSULOSIN HCL 0.4 MG PO CAPS
0.4000 mg | ORAL_CAPSULE | Freq: Every day | ORAL | Status: DC
  Administered 2015-03-16 – 2015-03-17 (×2): 0.4 mg via ORAL
  Filled 2015-03-15 (×2): qty 1

## 2015-03-15 MED ORDER — DEXTROSE 5 % IV SOLN
1.0000 g | Freq: Once | INTRAVENOUS | Status: AC
  Administered 2015-03-15: 1 g via INTRAVENOUS
  Filled 2015-03-15: qty 10

## 2015-03-15 MED ORDER — MOMETASONE FURO-FORMOTEROL FUM 100-5 MCG/ACT IN AERO
2.0000 | INHALATION_SPRAY | Freq: Two times a day (BID) | RESPIRATORY_TRACT | Status: DC
  Administered 2015-03-16 – 2015-03-17 (×5): 2 via RESPIRATORY_TRACT
  Filled 2015-03-15: qty 8.8

## 2015-03-15 MED ORDER — FUROSEMIDE 10 MG/ML IJ SOLN
60.0000 mg | Freq: Two times a day (BID) | INTRAMUSCULAR | Status: AC
  Administered 2015-03-16 (×2): 60 mg via INTRAVENOUS
  Filled 2015-03-15 (×2): qty 8

## 2015-03-15 MED ORDER — HYDROCODONE-ACETAMINOPHEN 5-325 MG PO TABS
1.0000 | ORAL_TABLET | Freq: Four times a day (QID) | ORAL | Status: DC | PRN
  Administered 2015-03-17: 1 via ORAL
  Filled 2015-03-15: qty 1

## 2015-03-15 MED ORDER — SODIUM CHLORIDE 0.9 % IJ SOLN: 3.0000 mL | INTRAMUSCULAR | Status: DC | PRN

## 2015-03-15 MED ORDER — SODIUM CHLORIDE 0.9 % IV SOLN: 250.0000 mL | INTRAVENOUS | Status: DC | PRN

## 2015-03-15 MED ORDER — SERTRALINE HCL 25 MG PO TABS
25.0000 mg | ORAL_TABLET | Freq: Every day | ORAL | Status: DC
  Administered 2015-03-16 – 2015-03-17 (×2): 25 mg via ORAL
  Filled 2015-03-15 (×3): qty 1

## 2015-03-15 MED ORDER — DEXTROSE 50 % IV SOLN
1.0000 | Freq: Once | INTRAVENOUS | Status: AC
  Administered 2015-03-15: 50 mL via INTRAVENOUS
  Filled 2015-03-15: qty 50

## 2015-03-15 MED ORDER — INSULIN ASPART 100 UNIT/ML ~~LOC~~ SOLN
0.0000 [IU] | Freq: Every day | SUBCUTANEOUS | Status: DC
  Administered 2015-03-16: 3 [IU] via SUBCUTANEOUS

## 2015-03-15 MED ORDER — DEXTROSE 5 % IV SOLN
1.0000 g | Freq: Once | INTRAVENOUS | Status: DC
  Filled 2015-03-15: qty 10

## 2015-03-15 MED ORDER — SENNOSIDES-DOCUSATE SODIUM 8.6-50 MG PO TABS: 2.0000 | ORAL_TABLET | Freq: Every evening | ORAL | Status: DC | PRN

## 2015-03-15 MED ORDER — ACETAMINOPHEN 325 MG PO TABS
650.0000 mg | ORAL_TABLET | Freq: Four times a day (QID) | ORAL | Status: DC | PRN
  Administered 2015-03-17: 650 mg via ORAL
  Filled 2015-03-15: qty 2

## 2015-03-15 MED ORDER — INSULIN ASPART 100 UNIT/ML ~~LOC~~ SOLN
0.0000 [IU] | Freq: Three times a day (TID) | SUBCUTANEOUS | Status: DC
  Administered 2015-03-16 (×2): 5 [IU] via SUBCUTANEOUS
  Administered 2015-03-16: 3 [IU] via SUBCUTANEOUS
  Administered 2015-03-17 (×2): 8 [IU] via SUBCUTANEOUS
  Administered 2015-03-17: 15 [IU] via SUBCUTANEOUS

## 2015-03-15 MED ORDER — FUROSEMIDE 10 MG/ML IJ SOLN
40.0000 mg | Freq: Once | INTRAMUSCULAR | Status: AC
  Administered 2015-03-15: 40 mg via INTRAVENOUS
  Filled 2015-03-15: qty 4

## 2015-03-15 MED ORDER — METOCLOPRAMIDE HCL 5 MG PO TABS
5.0000 mg | ORAL_TABLET | Freq: Every day | ORAL | Status: DC
Start: 2015-03-15 — End: 2015-03-18
  Administered 2015-03-16 (×2): 5 mg via ORAL
  Filled 2015-03-15 (×2): qty 1

## 2015-03-15 MED ORDER — MIRABEGRON ER 50 MG PO TB24
50.0000 mg | ORAL_TABLET | Freq: Every day | ORAL | Status: DC
  Administered 2015-03-16 – 2015-03-17 (×2): 50 mg via ORAL
  Filled 2015-03-15 (×2): qty 1

## 2015-03-15 MED ORDER — AZITHROMYCIN 250 MG PO TABS
500.0000 mg | ORAL_TABLET | Freq: Once | ORAL | Status: AC
Start: 2015-03-15 — End: 2015-03-15
  Administered 2015-03-15: 500 mg via ORAL
  Filled 2015-03-15: qty 2

## 2015-03-15 MED ORDER — PANTOPRAZOLE SODIUM 40 MG PO TBEC
40.0000 mg | DELAYED_RELEASE_TABLET | Freq: Every day | ORAL | Status: DC
  Administered 2015-03-16 – 2015-03-17 (×2): 40 mg via ORAL
  Filled 2015-03-15 (×3): qty 1

## 2015-03-15 MED ORDER — AMITRIPTYLINE HCL 50 MG PO TABS
25.0000 mg | ORAL_TABLET | Freq: Every day | ORAL | Status: DC
  Administered 2015-03-16: 25 mg via ORAL
  Filled 2015-03-15: qty 1

## 2015-03-15 MED ORDER — DOXAZOSIN MESYLATE 2 MG PO TABS
2.0000 mg | ORAL_TABLET | Freq: Every day | ORAL | Status: DC
  Administered 2015-03-16: 2 mg via ORAL
  Filled 2015-03-15 (×3): qty 1

## 2015-03-15 MED ORDER — GABAPENTIN 100 MG PO CAPS
100.0000 mg | ORAL_CAPSULE | Freq: Three times a day (TID) | ORAL | Status: DC
  Administered 2015-03-16 – 2015-03-17 (×6): 100 mg via ORAL
  Filled 2015-03-15 (×6): qty 1

## 2015-03-15 MED ORDER — MONTELUKAST SODIUM 10 MG PO TABS
10.0000 mg | ORAL_TABLET | Freq: Every day | ORAL | Status: DC
  Administered 2015-03-16 (×2): 10 mg via ORAL
  Filled 2015-03-15 (×2): qty 1

## 2015-03-15 MED ORDER — SIMVASTATIN 20 MG PO TABS
20.0000 mg | ORAL_TABLET | Freq: Every day | ORAL | Status: DC
  Administered 2015-03-16 (×2): 20 mg via ORAL
  Filled 2015-03-15 (×2): qty 1

## 2015-03-15 MED ORDER — CEFTRIAXONE SODIUM 1 G IJ SOLR: 1.0000 g | Freq: Once | INTRAMUSCULAR | Status: DC

## 2015-03-15 MED ORDER — SODIUM CHLORIDE 0.9 % IJ SOLN
3.0000 mL | Freq: Two times a day (BID) | INTRAMUSCULAR | Status: DC
  Administered 2015-03-16 (×3): 3 mL via INTRAVENOUS

## 2015-03-15 NOTE — ED Provider Notes (Signed)
The patient is a 81 year old male, he is morbidly obese, he has congestive heart failure.   History of echocardiogram in 2013 showing an ejection fraction of over 60% but grade 2 diastolic dysfunction. He is currently being treated for chronic kidney disease and is on Demadex as a diaphoretic. He lives in an assisted care facility but his daughters have noticed increased sleepiness, weakness and increased respiratory rate. He was noted to be hypoxic and tachypneic in the office and sent to the emergency department for further evaluation. On my exam the patient has bilateral lower extremity pitting edema which appears to be overall symmetrical, he is diffusely weak but able to speak. He is mildly tachypneic and has slight rales at the bases bilaterally. His heart sounds are otherwise normal, strong pulses at the radial arteries, normal appearing mucous membranes, normal-appearing pupils. He speaks in shortened sentences.  Vital signs show tachypnea, hypertension, paced rhythm on the EKG, afebrile.  Chest x-ray, labs, further evaluate the source of the shortness of breath which appears to be fluid overload. Anticipate admission. We'll give diuresis and decrease preload with Nitropaste.   EKG Interpretation  Date/Time:  Monday March 15 2015 17:00:31 EDT Ventricular Rate:  60 PR Interval:  194 QRS Duration: 156 QT Interval:  473 QTC Calculation: 473 R Axis:   -93 Text Interpretation:  Sinus rhythm Ventricular premature complex RBBB and LAFB Baseline wander in lead(s) V6 since last tracing no significant change Confirmed by Sonny Anthes  MD, Ishan Sanroman ([id number]) on 03/15/2015 5:27:30 PM        Medical screening examination/treatment/procedure(s) were conducted as a shared visit with non-physician practitioner(s) and myself.  I personally evaluated the patient during the encounter.  Clinical Impression:   Final diagnoses:  UTI (lower urinary tract infection)  Generalized weakness  Shortness of breath          Eber Hong, MD 03/17/15 [phone]

## 2015-03-15 NOTE — ED Notes (Signed)
NTG patch removed per admitting MD.

## 2015-03-15 NOTE — ED Notes (Signed)
Pt presents via [facility] from MD office for generalized weakness and SOB on exertion x 1 week.  Pt sent for evaluation of PNA vs CHF. Hx: Afib, HTN, DM T2, CKD, HLD.  Pt O2 at MD office was 80s on RA, 2L [place] applied by EMS up to 100%.  BP-156/82, A x 4, NAD, sleeping on and off on arrival.  96% RA.

## 2015-03-15 NOTE — H&P (Signed)
Family Medicine Teaching [facility] Admission History and Physical Service Pager: [phone]  Patient name: Tyler [name] Medical record number: [id number] Date of birth: 1934/02/27 Age: 81 y.o. Gender: male  Primary Care Provider: [name], MD Consultants: none Code Status: Full  Chief Complaint: short of breath  Assessment and Plan: Tyler [name] is a 81 y.o. male presenting with dyspnea . PMH is significant for CHF, SSS s/p pacemaker, a flutter/fib on warfarin, CKD, HTN, T2DM, HLD, BPH, Asthma, OSA, anemia  # SOB/CP: New oxygen requirement in outpatient office with o2 sat in 80s and ED, though on admission oxygen was off and sating in upper 90s. Afebrile, no leukocytosis, BNP 196, CXR with cardiomegaly and pulmonary vascular congestion. Echo 2013 EF 60-65% grade 2 diastolic dysfunction.  Home diuretic dose torsemide 50mg . DDx CHF exacerbation, asthma/copd exacerbation, pneumonia, ACS. S/p IV lasix 40mg  in ED, azithromycin for possible CAP which I feel is less likely.  - telemetry - continue lasix 60mg  IV x 2 doses overnight - cycle troponins - daily weights - strict I&Os - repeat echo in AM - consider consult to cards in AM (pt and family would prefer to be seen as recent   # Hyperkalemia: K+ 5.7 on admission. On no potassium sparing agents. No EKG changes - getting loop diuretics overnight - repeat BMP in AM  # CKD: Cr 2.05 on admission, prior value 1.9 in 06/2013 and 1.8 02/2013 so may be baseline - repeat BMP in AM  # UTI: was on treatment with macrobid as outpatient for this starting last week. UA with large leukocytes, microscopy TNTC WBC and few bacteria. S/p ceftriaxone in ED. - urine cx - continue ctx ordered for tomorrow night  # T2DM: on lantus 22u in AM and 40u qHS  - CBG monitoring qAC qHS - SSI  # HTN: stable  - hold metoprolol in acute exacerbation  # BPH:  - on both flomax and cardura... Will continue both for now and discuss in AM  with patient - continue myrbetriq  # Afib/flutter on warfarin - continue warfarin per pharm  FEN/GI: diet HH/carb mod / saline lock Prophylaxis: heparin sq  Disposition: admit   History of Present Illness: Tyler [name] is a 81 y.o. male presenting with SOB, chest pain, "stomach" complaint. Pt reports symptoms have been ongoing for "years". SOB he says is baseline, "maybe" worse recently but can't give an exact time frame. He has a non-productive cough, subjective fever but not measured. His daughters at bedside say he was diagnosed with CHF 11 years ago. He measures his weight weekly, says it was last 284lbs which is about where it was the week before but up 20lbs since March. He also says he has chest pain across his whole chest that lasts for a few minutes which is something he has had going on. The last complaint of stomach bloating has been present for years as well and has not noticed it being any worse recently. Pt was seen in PCP office and found to be hypoxic with oxygen saturation in 80s and sent to ED for further evaluation. Ox sat responded well to 2L O2 by [place] and while in the ED he was able to get off of this.   Review Of Systems: Per HPI with the following additions: +trouble swallowing (chronic dry mouth, globus sensation) Otherwise 12 point review of systems was performed and was unremarkable.  Patient Active Problem List   Diagnosis Date Noted  . Shortness of breath 03/15/2015  .  Pacemaker-St.Jude 06/10/2012  . 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 Constipation, chronic 05/30/2012  . Warfarin anticoagulation 05/30/2012  . Obesity 05/30/2012  . OSA (obstructive sleep apnea)-noncompliant with CPAP 05/30/2012  . Junctional rhythm 05/29/2012  . Bifascicular block 05/29/2012  . CKD (chronic kidney disease) stage 3, GFR 30-59 ml/min 05/29/2012  . Type II or unspecified type diabetes mellitus without mention of complication, uncontrolled 11/26/2009  . HYPERLIPIDEMIA 11/26/2009   . ANEMIA-NOS 11/26/2009  . HYPERTENSION 11/26/2009  . ATRIAL FIBRILLATION, PAROXYSMAL 11/26/2009  . ALLERGIC RHINITIS 11/26/2009  . ASTHMA 08/16/2006  . GERD 08/16/2006  . GASTROPARESIS 08/16/2006  . ABNORMAL RESULT, FUNCTION STUDY, LIVER 08/16/2006   Past Medical History: Past Medical History  Diagnosis Date  . Diabetes mellitus    . Hyperlipidemia    . Hypertension    . Asthma   . Allergic rhinitis   . GERD (gastroesophageal reflux disease)   . BPH (benign prostatic hypertrophy)     s/p thermo therapy, has bladdero utlet obst. and instablitiy, incontient  . Anemia   . Chronic constipation   . Sick sinus syndrome     a. s/p STJ dual chamber pacemaker  . Paroxysmal atrial fibrillation     a. status post direct cardioversion b. anticoagulated with Warfarin  . Obesity   . Sleep apnea     noncomplicance w/ CPAP  . Gastroparesis     dx in 2006 through gastric scan  . Gynecomastia     nomral mammograms 11/2008  . Chronic kidney disease   . Globus sensation     2012 EGD, Ba swallow, both negative.  MBSS 2012 negative for dysphagia.   Marland Kitchen Atypical atrial flutter     a. CL b. able to be pace terminated   Past Surgical History: Past Surgical History  Procedure Laterality Date  . Cataract extraction      bilateral  . Rotator cuff repair      left  . Cardiac catheterization  2003    normal, left ventricular function (done after an abnormal cardiolite)  . Tumor removal      off arms  . Permanent pacemaker insertion N/A 06/07/2012    STJ Assurity dual chamber pacemaker implanted by Dr [name] for SSS   Social History: Social History  Substance Use Topics  . Smoking status: Former Smoker    Types: Cigarettes    Quit date: 07/25/1971  . Smokeless tobacco: Former Neurosurgeon    Types: Chew  . Alcohol Use: No   Additional social history: lives at ALF ([facility]), uses wheelchair for getting around.  Please also refer to relevant sections of EMR.  Family  History: Family History  Problem Relation Age of Onset  . Colon cancer Neg Hx   . Prostate cancer Maternal Uncle   . Heart disease Paternal Uncle     x 3, paternal aunt   Allergies and Medications: Allergies  Allergen Reactions  . Neosporin Original [Bacitracin-Neomycin-Polymyxin]     Rinaldo Cloud McWhite from [facility] [phone] states pt is allergic to Neosporin and Polysporin ointments.   No current facility-administered medications on file prior to encounter.   Current Outpatient Prescriptions on File Prior to Encounter  Medication Sig Dispense Refill  . albuterol (VENTOLIN HFA) 108 (90 BASE) MCG/ACT inhaler Inhale 2 puffs into the lungs every 4 (four) hours as needed. For shortness of breath    . amitriptyline (ELAVIL) 25 MG tablet Take 1 tablet (25 mg total) by mouth at bedtime. 30 tablet 1  . amoxicillin-clavulanate (AUGMENTIN)  875-125 MG per tablet Take 1 tablet by mouth 2 (two) times daily. 20 tablet 0  . Azelastine-Fluticasone 137-50 MCG/ACT SUSP Place 2 sprays into the nose 2 (two) times daily as needed.     . cetaphil (CETAPHIL) lotion Apply 1 application topically as needed for dry skin.    Marland Kitchen diclofenac sodium (VOLTAREN) 1 % GEL Place onto the skin daily as needed. 4 TIMES DAILY AS NEEDED    . donepezil (ARICEPT) 10 MG tablet Take 10 mg by mouth at bedtime.      Marland Kitchen doxazosin (CARDURA) 2 MG tablet Take 1 tablet (2 mg total) by mouth at bedtime. 30 tablet 0  . ferrous sulfate 325 (65 FE) MG tablet Take 325 mg by mouth daily with breakfast.      . fluticasone (FLONASE) 50 MCG/ACT nasal spray Place 2 sprays into both nostrils daily.    . Fluticasone-Salmeterol (ADVAIR) 100-50 MCG/DOSE AEPB Inhale 1 puff into the lungs 2 (two) times daily. Rinse mouth and spit after each use    . Foot Care Products (PREVALON HEEL PROTECTOR) MISC Apply the boot to the left foot to protect the heel at night and during the day when patient is in his chair 1 each 0  . gabapentin (NEURONTIN) 100 MG  capsule Take 100 mg by mouth 3 (three) times daily.    Marland Kitchen guaiFENesin (MUCINEX) 600 MG 12 hr tablet Take 600 mg by mouth 2 (two) times daily as needed.    Marland Kitchen HYDROcodone-acetaminophen (NORCO/VICODIN) 5-325 MG per tablet Take 1-2 tablets by mouth every 6 (six) hours as needed for severe pain.    Marland Kitchen insulin aspart (NOVOLOG) 100 UNIT/ML injection Inject into the skin 3 (three) times daily before meals. 10 units before breakfast and lunch , 12units before dinner    . insulin glargine (LANTUS) 100 UNIT/ML injection 22 units in the morning and 40 units at bedtime    . meloxicam (MOBIC) 7.5 MG tablet Take 7.5 mg by mouth daily.    . metoCLOPramide (REGLAN) 10 MG tablet Take 0.5 tablets (5 mg total) by mouth at bedtime. 30 tablet 11  . metoprolol tartrate (LOPRESSOR) 25 MG tablet Take 1 tablet (25 mg total) by mouth 2 (two) times daily. 180 tablet 3  . mirabegron ER (MYRBETRIQ) 50 MG TB24 tablet Take 50 mg by mouth daily.    . montelukast (SINGULAIR) 10 MG tablet Take 10 mg by mouth at bedtime.     . Multiple Vitamin (DAILY VITE) TABS Take by mouth daily.    Marland Kitchen neomycin-bacitracin-polymyxin (NEOSPORIN) OINT Apply 1 application topically daily. To left big toe    . Olopatadine HCl (PATADAY) 0.2 % SOLN Apply 1 drop to eye daily.    Marland Kitchen omeprazole (PRILOSEC) 40 MG capsule Take 40 mg by mouth daily.    . polyethylene glycol (MIRALAX / GLYCOLAX) packet Take 17 g by mouth as directed. Take on Mon.. Wed. and Fri.    . ranitidine (ZANTAC) 150 MG capsule Take 150 mg by mouth 2 (two) times daily.    . sennosides-docusate sodium (SENOKOT-S) 8.6-50 MG tablet Take 2 tablets by mouth at bedtime as needed. For constipation    . sertraline (ZOLOFT) 25 MG tablet Take 25 mg by mouth daily.      . simvastatin (ZOCOR) 20 MG tablet Take 20 mg by mouth at bedtime.      . tamsulosin (FLOMAX) 0.4 MG CAPS capsule Take 0.4 mg by mouth daily.    Marland Kitchen torsemide (DEMADEX) 100 MG tablet Take  50 mg by mouth daily.      Marland Kitchen trolamine salicylate  (ASPERCREME) 10 % cream Apply 1 application topically as needed. To left shoulder    . vitamin C (ASCORBIC ACID) 500 MG tablet Take 500 mg by mouth daily.    . Vitamin D, Ergocalciferol, (DRISDOL) 50000 UNITS CAPS Take 50,000 Units by mouth every 7 (seven) days. Fridays    . warfarin (COUMADIN) 2 MG tablet Take as directed by the coumadin clinic      Objective: BP 145/69 mmHg  Pulse 59  Temp(Src) 99.3 F (37.4 C) (Oral)  Resp 25  Wt 275 lb (124.739 kg)  SpO2 98% Exam: General: NAD Eyes: pupils constricted bilaterally but reactive to light, arcus senilis bilaterally ENTM: poor dentition/halitosis. Membranes are dry appearing Neck: normal Cardiovascular: RRR, quiet heart sounds, no murmurs appreciated. 2+ radial pulses, unable to palpate DP/PT pulses but feet/toes are WWP. 2-3+ pitting edema bilaterally Respiratory: bibasilar crackles and end expiratory wheezes present, normal effort Abdomen: obese, distended, nontender, normal bowel sounds MSK: a few anterior LE ulcers (not wheeping), otherwise no deformities Skin: no rashes Neuro: alert and oriented, able to speak in short sentences. No focal deficits. Psych: normal thought content and speech.  Labs and Imaging: CBC BMET   Recent Labs Lab 03/15/15 1800  WBC 8.2  HGB 10.4*  HCT 31.9*  PLT 177    Recent Labs Lab 03/15/15 1800  NA 142  K 5.7*  CL 106  CO2 27  BUN 32*  CREATININE 2.05*  GLUCOSE 48*  CALCIUM 8.6*     Dg Chest 2 View  03/15/2015   CLINICAL DATA:  Shortness of breath today, history hypertension, type II diabetes mellitus, hyperlipidemia, asthma, former smoker  EXAM: CHEST  2 VIEW  COMPARISON:  06/08/2012  FINDINGS: LEFT subclavian transvenous pacemaker leads project at RIGHT atrium and RIGHT ventricle, appearing unchanged from previous exam.  Enlargement of cardiac silhouette with pulmonary vascular congestion.  Persistent small pleural effusions.  Chronic elevation of RIGHT diaphragm.  No definite  pulmonary edema, segmental consolidation or pneumothorax.  Bones unremarkable.  IMPRESSION: Enlargement of cardiac silhouette with pulmonary vascular congestion.  Small bibasilar effusions.   Electronically Signed   By: Ulyses Southward M.D.   On: 03/15/2015 17:44    Nani Ravens, MD 03/15/2015, 8:31 PM PGY-3, [facility] Family Medicine FPTS Intern pager: [phone], text pages welcome

## 2015-03-15 NOTE — ED Provider Notes (Signed)
CSN: [id number]     Arrival date & time 03/15/15  1653 History   First MD Initiated Contact with Patient 03/15/15 1654     Chief Complaint  Patient presents with  . Shortness of Breath  . Weakness     Patient is a 81 y.o. male presenting with shortness of breath and weakness. The history is provided by the patient. No language interpreter was used.  Shortness of Breath Associated symptoms: chest pain   Associated symptoms: no abdominal pain, no cough, no diaphoresis, no fever, no headaches, no neck pain, no rash, no vomiting and no wheezing   Weakness Associated symptoms include arthralgias, chest pain, fatigue, nausea and weakness. Pertinent negatives include no abdominal pain, chills, congestion, coughing, diaphoresis, fever, headaches, myalgias, neck pain, numbness, rash or vomiting.      Tyler [name] is a 81 y.o. male with a PMH of DM, HLD, HTN, CKD, asthma, atrial fibrillation on coumadin who presents to the ED with shortness of breath and weakness x 1 week. Patient reports he was at his doctor's office and was noted to have an O2 sat in the 80s on RA. 2L Per EMS report, O2 sat improved to 100% s/p administration of 2L O2 by [place].  He reports shortness of breath for the past month, which worsened this week. He reports movement exacerbates his shortness of breath and resting relieves his symptoms. He reports generalized weakness and fatigue. He reports chest pain, which he states is in the center of his chest and comes and goes. He states nothing seems to precipitate his chest pain and he is unable to identify any alleviating factors. He denies headaches, lightheadedness, dizziness, syncope, visual changes. He denies fever, chills, abdominal pain. He reports nausea. He denies vomiting, diarrhea, constipation. Reports urinary frequency and urgency, which he states is unchanged from baseline. He denies dysuria, back pain.   Past Medical History  Diagnosis Date  . Diabetes mellitus    .  Hyperlipidemia    . Hypertension    . Asthma   . Allergic rhinitis   . GERD (gastroesophageal reflux disease)   . BPH (benign prostatic hypertrophy)     s/p thermo therapy, has bladdero utlet obst. and instablitiy, incontient  . Anemia   . Chronic constipation   . Sick sinus syndrome     a. s/p STJ dual chamber pacemaker  . Paroxysmal atrial fibrillation     a. status post direct cardioversion b. anticoagulated with Warfarin  . Obesity   . Sleep apnea     noncomplicance w/ CPAP  . Gastroparesis     dx in 2006 through gastric scan  . Gynecomastia     nomral mammograms 11/2008  . Chronic kidney disease   . Globus sensation     2012 EGD, Ba swallow, both negative.  MBSS 2012 negative for dysphagia.   Marland Kitchen Atypical atrial flutter     a. CL b. able to be pace terminated   Past Surgical History  Procedure Laterality Date  . Cataract extraction      bilateral  . Rotator cuff repair      left  . Cardiac catheterization  2003    normal, left ventricular function (done after an abnormal cardiolite)  . Tumor removal      off arms  . Permanent pacemaker insertion N/A 06/07/2012    STJ Assurity dual chamber pacemaker implanted by Dr [name] for SSS   Family History  Problem Relation Age of Onset  . Colon  cancer Neg Hx   . Prostate cancer Maternal Uncle   . Heart disease Paternal Uncle     x 3, paternal aunt   Social History  Substance Use Topics  . Smoking status: Former Smoker    Types: Cigarettes    Quit date: 07/25/1971  . Smokeless tobacco: Former Neurosurgeon    Types: Chew  . Alcohol Use: No      Review of Systems  Constitutional: Positive for fatigue. Negative for fever, chills, diaphoresis, activity change and appetite change.  HENT: Negative for congestion.   Eyes: Negative for visual disturbance.  Respiratory: Positive for shortness of breath. Negative for cough, wheezing and stridor.   Cardiovascular: Positive for chest pain and leg swelling. Negative for  palpitations.  Gastrointestinal: Positive for nausea and abdominal distention. Negative for vomiting, abdominal pain, diarrhea and constipation.       Reports he feels more bloated.  Genitourinary: Positive for urgency and frequency. Negative for dysuria, hematuria and flank pain.  Musculoskeletal: Positive for arthralgias. Negative for myalgias, back pain, gait problem, neck pain and neck stiffness.       Reports his joints ache all over.  Skin: Negative for color change, pallor, rash and wound.  Neurological: Positive for weakness. Negative for dizziness, syncope, light-headedness, numbness and headaches.       Reports generalized weakness.  All other systems reviewed and are negative.     Allergies  Neosporin original  Home Medications   Prior to Admission medications   Medication Sig Start Date End Date Taking? Authorizing Provider  albuterol (VENTOLIN HFA) 108 (90 BASE) MCG/ACT inhaler Inhale 2 puffs into the lungs every 4 (four) hours as needed. For shortness of breath   Yes Historical Provider, MD  amitriptyline (ELAVIL) 25 MG tablet Take 1 tablet (25 mg total) by mouth at bedtime. 05/29/13  Yes Delories Heinz, DPM  Azelastine-Fluticasone 137-50 MCG/ACT SUSP Place 2 sprays into the nose 2 (two) times daily as needed.    Yes Historical Provider, MD  Bepotastine Besilate 1.5 % SOLN    Yes Historical Provider, MD  cetaphil (CETAPHIL) lotion Apply 1 application topically daily.    Yes Historical Provider, MD  cetirizine (ZYRTEC) 10 MG tablet Take 10 mg by mouth daily.   Yes Historical Provider, MD  donepezil (ARICEPT) 10 MG tablet Take 10 mg by mouth at bedtime.     Yes Historical Provider, MD  doxazosin (CARDURA) 2 MG tablet Take 1 tablet (2 mg total) by mouth at bedtime. 06/10/12  Yes Rhetta Mura, MD  ferrous sulfate 325 (65 FE) MG tablet Take 325 mg by mouth daily with breakfast.     Yes Historical Provider, MD  fluticasone (FLONASE) 50 MCG/ACT nasal spray Place 2 sprays  into both nostrils daily.   Yes Historical Provider, MD  Fluticasone-Salmeterol (ADVAIR) 100-50 MCG/DOSE AEPB Inhale 1 puff into the lungs 2 (two) times daily. Rinse mouth and spit after each use   Yes Historical Provider, MD  Foot Care Products (PREVALON HEEL PROTECTOR) MISC Apply the boot to the left foot to protect the heel at night and during the day when patient is in his chair 10/30/13  Yes Delories Heinz, DPM  gabapentin (NEURONTIN) 100 MG capsule Take 100 mg by mouth 3 (three) times daily.   Yes Historical Provider, MD  HYDROcodone-acetaminophen (NORCO/VICODIN) 5-325 MG per tablet Take 1 tablet by mouth every 6 (six) hours as needed for severe pain.    Yes Historical Provider, MD  insulin aspart (NOVOLOG) 100  UNIT/ML injection Inject into the skin 3 (three) times daily before meals. 10 units before breakfast and lunch , 12units before dinner   Yes Historical Provider, MD  insulin glargine (LANTUS) 100 UNIT/ML injection 27 units in the morning and 50 units at bedtime 06/10/12  Yes Rhetta Mura, MD  loratadine (CLARITIN) 10 MG tablet Take 10 mg by mouth daily.   Yes Historical Provider, MD  magnesium hydroxide (MILK OF MAGNESIA) 400 MG/5ML suspension Take 30 mLs by mouth daily as needed for mild constipation.   Yes Historical Provider, MD  meloxicam (MOBIC) 7.5 MG tablet Take 7.5 mg by mouth daily.   Yes Historical Provider, MD  metoCLOPramide (REGLAN) 10 MG tablet Take 0.5 tablets (5 mg total) by mouth at bedtime. 09/18/11 03/16/15 Yes Rachael Fee, MD  metoprolol tartrate (LOPRESSOR) 25 MG tablet Take 1 tablet (25 mg total) by mouth 2 (two) times daily. 12/28/14  Yes Amber Caryl Bis, NP  mirabegron ER (MYRBETRIQ) 50 MG TB24 tablet Take 50 mg by mouth daily.   Yes Historical Provider, MD  montelukast (SINGULAIR) 10 MG tablet Take 10 mg by mouth at bedtime.    Yes Historical Provider, MD  Multiple Vitamin (DAILY VITE) TABS Take by mouth daily. 08/25/14  Yes Historical Provider, MD   omeprazole (PRILOSEC) 40 MG capsule Take 40 mg by mouth daily.   Yes Historical Provider, MD  polyethylene glycol (MIRALAX / GLYCOLAX) packet Take 17 g by mouth as directed. Take on Mon.. Wed. and Fri.   Yes Historical Provider, MD  ranitidine (ZANTAC) 150 MG capsule Take 150 mg by mouth 2 (two) times daily.   Yes Historical Provider, MD  sertraline (ZOLOFT) 25 MG tablet Take 25 mg by mouth daily.     Yes Historical Provider, MD  simvastatin (ZOCOR) 40 MG tablet Take 40 mg by mouth daily at 6 PM.   Yes Historical Provider, MD  tamsulosin (FLOMAX) 0.4 MG CAPS capsule Take 0.4 mg by mouth daily.   Yes Historical Provider, MD  torsemide (DEMADEX) 100 MG tablet Take 50 mg by mouth daily.     Yes Historical Provider, MD  vitamin C (ASCORBIC ACID) 500 MG tablet Take 500 mg by mouth daily. 08/25/14 08/25/15 Yes Historical Provider, MD  Vitamin D, Ergocalciferol, (DRISDOL) 50000 UNITS CAPS Take 50,000 Units by mouth every 7 (seven) days. Fridays   Yes Historical Provider, MD  warfarin (COUMADIN) 7.5 MG tablet Take 7.5 mg by mouth one time only at 6 PM.   Yes Historical Provider, MD    BP 155/42 mmHg  Pulse 59  Temp(Src) 99 F (37.2 C) (Oral)  Resp 28  Wt 287 lb 8 oz (130.409 kg)  SpO2 95% Physical Exam  Constitutional: He is oriented to person, place, and time. No distress.  Obese male in no acute distress.  HENT:  Head: Normocephalic and atraumatic.  Right Ear: External ear normal.  Left Ear: External ear normal.  Nose: Nose normal.  Mouth/Throat: Uvula is midline, oropharynx is clear and moist and mucous membranes are normal. No oropharyngeal exudate.  Eyes: Conjunctivae, EOM and lids are normal. Pupils are equal, round, and reactive to light. Right eye exhibits no discharge. Left eye exhibits no discharge. No scleral icterus.  Neck: Normal range of motion. Neck supple.  Difficult to assess JVD given patient's body habitus.  Cardiovascular: Normal rate, regular rhythm, normal heart sounds and  intact distal pulses.   Pulmonary/Chest: No accessory muscle usage. No respiratory distress. He has no wheezes. He has rales.  He exhibits no tenderness.  Rales to lung bases bilaterally.  Abdominal: Soft. Bowel sounds are normal. He exhibits distension. He exhibits no mass. There is no tenderness. There is no rebound and no guarding.  Abdomen soft, distended.  Musculoskeletal: Normal range of motion. He exhibits edema.  1+ pitting edema to lower extremities bilaterally.  Neurological: He is alert and oriented to person, place, and time. He has normal strength. No cranial nerve deficit or sensory deficit.  Skin: Skin is warm and dry. No rash noted. He is not diaphoretic. No erythema. No pallor.  Psychiatric: He has a normal mood and affect. His speech is normal and behavior is normal.  Nursing note and vitals reviewed.   ED Course  Procedures (including critical care time)  Labs Review Labs Reviewed  BASIC METABOLIC PANEL - Abnormal; Notable for the following:    Potassium 5.7 (*)    Glucose, Bld 48 (*)    BUN 32 (*)    Creatinine, Ser 2.05 (*)    Calcium 8.6 (*)    GFR calc non Af Amer 29 (*)    GFR calc Af Amer 34 (*)    All other components within normal limits  CBC - Abnormal; Notable for the following:    RBC 3.53 (*)    Hemoglobin 10.4 (*)    HCT 31.9 (*)    All other components within normal limits  PROTIME-INR - Abnormal; Notable for the following:    Prothrombin Time 25.6 (*)    INR 2.37 (*)    All other components within normal limits  URINALYSIS, ROUTINE W REFLEX MICROSCOPIC (NOT AT [facility]) - Abnormal; Notable for the following:    Hgb urine dipstick SMALL (*)    Leukocytes, UA LARGE (*)    All other components within normal limits  BRAIN NATRIURETIC PEPTIDE - Abnormal; Notable for the following:    B Natriuretic Peptide 195.8 (*)    All other components within normal limits  HEPATIC FUNCTION PANEL - Abnormal; Notable for the following:    Albumin 3.2 (*)    AST  65 (*)    Total Bilirubin 1.8 (*)    Bilirubin, Direct 0.7 (*)    Indirect Bilirubin 1.1 (*)    All other components within normal limits  AMMONIA - Abnormal; Notable for the following:    Ammonia 68 (*)    All other components within normal limits  URINE MICROSCOPIC-ADD ON - Abnormal; Notable for the following:    Bacteria, UA FEW (*)    All other components within normal limits  GLUCOSE, CAPILLARY - Abnormal; Notable for the following:    Glucose-Capillary 144 (*)    All other components within normal limits  CBG MONITORING, ED - Abnormal; Notable for the following:    Glucose-Capillary 41 (*)    All other components within normal limits  CBG MONITORING, ED - Abnormal; Notable for the following:    Glucose-Capillary 177 (*)    All other components within normal limits  URINE CULTURE  BASIC METABOLIC PANEL  TROPONIN I  TROPONIN I  TROPONIN I  PROTIME-INR  I-STAT TROPOININ, ED   Imaging Review Dg Chest 2 View  03/15/2015   CLINICAL DATA:  Shortness of breath today, history hypertension, type II diabetes mellitus, hyperlipidemia, asthma, former smoker  EXAM: CHEST  2 VIEW  COMPARISON:  06/08/2012  FINDINGS: LEFT subclavian transvenous pacemaker leads project at RIGHT atrium and RIGHT ventricle, appearing unchanged from previous exam.  Enlargement of cardiac silhouette with pulmonary vascular  congestion.  Persistent small pleural effusions.  Chronic elevation of RIGHT diaphragm.  No definite pulmonary edema, segmental consolidation or pneumothorax.  Bones unremarkable.  IMPRESSION: Enlargement of cardiac silhouette with pulmonary vascular congestion.  Small bibasilar effusions.   Electronically Signed   By: Ulyses Southward M.D.   On: 03/15/2015 17:44     I have personally reviewed and evaluated these images and lab results as part of my medical decision-making.   EKG Interpretation   Date/Time:  Monday March 15 2015 17:00:31 EDT Ventricular Rate:  60 PR Interval:  194 QRS  Duration: 156 QT Interval:  473 QTC Calculation: 473 R Axis:   -93 Text Interpretation:  Sinus rhythm Ventricular premature complex RBBB and  LAFB Baseline wander in lead(s) V6 since last tracing no significant  change Confirmed by MILLER  MD, BRIAN ([id number]) on 03/15/2015 5:27:30 PM      MDM   Final diagnoses:  UTI (lower urinary tract infection)  Generalized weakness  Shortness of breath    81 year old male presents with shortness of breath and generalized weakness x 1 week. SOB exacerbated with activity, relieved with rest. Patient is afebrile. O2 sat 97% on RA in the ED. BP stable. On exam, obese male in no acute distress, intermittently falls asleep. Rales to bilateral lung bases. Abdomen soft, distended. 1+ pitting edema to lower extremities bilaterally. Last echo in 2013 demonstrates EF 60-65% and grade 2 diastolic dysfunction. CXR demonstrates enlargement of cardiac silhouette with pulmonary vascular congestion and small bibasilar effusion. Noted to be hypoxic PTA, tachypneic in the ED. Given azithromycin due to concern for possible CAP. Given lasix for diuresis and nitro paste to reduce preload given concern for CHF exacerbation.   CBC negative for leukocytosis, remarkable for anemia, which appears chronic and stable. Potassium elevated at 5.7, given 40 mg lasix. Glucose 48, given 1 ampule D50 with subsequent increase in glucose to 177. BUN elevated at 32, creatinine elevated at 2.05, consistent with CKD. BNP mildly elevated at 195.8.  UA with large leukocytes and TNTC WBC. Consistent with UTI. Given dose of rocephin in the ED.   Reports chest pain, though is unable to identify precipitating or alleviating factors. No evidence of acute ischemia on EKG. Troponin negative. Heart score 4.   Hepatic function panel remarkable for elevated AST at 65 and increased bilirubin (total 1.8, direct 0.7, indirect 1.1), ammonia elevated at 68. INR 2.37.   Patient discussed with internal medicine,  who will admit.  BP 155/42 mmHg  Pulse 59  Temp(Src) 99 F (37.2 C) (Oral)  Resp 28  Wt 287 lb 8 oz (130.409 kg)  SpO2 95%      Mady Gemma, PA-C 03/16/15 0121  Eber Hong, MD 03/17/15 [phone]

## 2015-03-15 NOTE — ED Notes (Signed)
PA at bedside.

## 2015-03-16 ENCOUNTER — Ambulatory Visit (HOSPITAL_BASED_OUTPATIENT_CLINIC_OR_DEPARTMENT_OTHER): Payer: Medicare Other | Attending: Family Medicine

## 2015-03-16 ENCOUNTER — Encounter (HOSPITAL_COMMUNITY): Payer: Self-pay | Admitting: *Deleted

## 2015-03-16 ENCOUNTER — Other Ambulatory Visit (HOSPITAL_BASED_OUTPATIENT_CLINIC_OR_DEPARTMENT_OTHER): Payer: Medicare Other

## 2015-03-16 DIAGNOSIS — I5030 Unspecified diastolic (congestive) heart failure: Secondary | ICD-10-CM

## 2015-03-16 DIAGNOSIS — I253 Aneurysm of heart: Secondary | ICD-10-CM | POA: Insufficient documentation

## 2015-03-16 DIAGNOSIS — I10 Essential (primary) hypertension: Secondary | ICD-10-CM | POA: Insufficient documentation

## 2015-03-16 DIAGNOSIS — I517 Cardiomegaly: Secondary | ICD-10-CM | POA: Diagnosis not present

## 2015-03-16 DIAGNOSIS — E119 Type 2 diabetes mellitus without complications: Secondary | ICD-10-CM | POA: Insufficient documentation

## 2015-03-16 DIAGNOSIS — E785 Hyperlipidemia, unspecified: Secondary | ICD-10-CM | POA: Insufficient documentation

## 2015-03-16 DIAGNOSIS — R0602 Shortness of breath: Secondary | ICD-10-CM

## 2015-03-16 DIAGNOSIS — I509 Heart failure, unspecified: Secondary | ICD-10-CM

## 2015-03-16 LAB — GLUCOSE, CAPILLARY
GLUCOSE-CAPILLARY: 241 mg/dL — AB (ref 65–99)
GLUCOSE-CAPILLARY: 222 mg/dL — AB (ref 65–99)
Glucose-Capillary: 183 mg/dL — ABNORMAL HIGH (ref 65–99)
Glucose-Capillary: 270 mg/dL — ABNORMAL HIGH (ref 65–99)

## 2015-03-16 LAB — TROPONIN I: Troponin I: <0.03 ng/mL (ref <0.031)

## 2015-03-16 LAB — BASIC METABOLIC PANEL
ANION GAP: 11 (ref 5–15)
BUN: 30 mg/dL — ABNORMAL HIGH (ref 6–20)
CALCIUM: 8.5 mg/dL — AB (ref 8.9–10.3)
CHLORIDE: 102 mmol/L (ref 101–111)
CO2: 25 mmol/L (ref 22–32)
Creatinine, Ser: 2 mg/dL — ABNORMAL HIGH (ref 0.61–1.24)
GFR calc non Af Amer: 30 mL/min — ABNORMAL LOW (ref >60)
GFR, EST AFRICAN AMERICAN: 35 mL/min — AB (ref >60)
GLUCOSE: 249 mg/dL — AB (ref 65–99)
Potassium: 4.4 mmol/L (ref 3.5–5.1)
Sodium: 138 mmol/L (ref 135–145)

## 2015-03-16 LAB — PROTIME-INR
INR: 2.48 — AB (ref 0.00–1.49)
PROTHROMBIN TIME: 26.5 s — AB (ref 11.6–15.2)

## 2015-03-16 MED ORDER — WARFARIN - PHARMACIST DOSING INPATIENT: Freq: Every day | Status: DC

## 2015-03-16 MED ORDER — WARFARIN SODIUM 7.5 MG PO TABS
7.5000 mg | ORAL_TABLET | Freq: Every day | ORAL | Status: DC
  Administered 2015-03-16 – 2015-03-17 (×2): 7.5 mg via ORAL
  Filled 2015-03-16 (×2): qty 1

## 2015-03-16 MED ORDER — MUPIROCIN CALCIUM 2 % EX CREA
TOPICAL_CREAM | Freq: Every day | CUTANEOUS | Status: DC
  Administered 2015-03-17: 1 via TOPICAL
  Filled 2015-03-16: qty 15

## 2015-03-16 MED ORDER — CEFTRIAXONE SODIUM 1 G IJ SOLR
1.0000 g | INTRAMUSCULAR | Status: DC
  Administered 2015-03-16: 1 g via INTRAVENOUS
  Filled 2015-03-16 (×2): qty 10

## 2015-03-16 MED ORDER — PERFLUTREN LIPID MICROSPHERE
1.0000 mL | INTRAVENOUS | Status: AC | PRN
  Administered 2015-03-16: 4 mL via INTRAVENOUS
  Filled 2015-03-16: qty 10

## 2015-03-16 NOTE — Progress Notes (Signed)
ANTICOAGULATION CONSULT NOTE  Pharmacy Consult for Coumadin Indication: atrial fibrillation  Allergies  Allergen Reactions  . Neosporin Original [Bacitracin-Neomycin-Polymyxin]     Rinaldo Cloud McWhite from [facility] [phone] states pt is allergic to Neosporin and Polysporin ointments.    Patient Measurements: Height:  (177.8 cm) Weight: 287 lb 8 oz (130.409 kg) IBW/kg (Calculated) : 73  Vital Signs: Temp: 98.4 F (36.9 C) (08/23 0540) Temp Source: Oral (08/23 0540) BP: 141/44 mmHg (08/23 0540) Pulse Rate: 60 (08/23 0540)  Labs:  Recent Labs  03/15/15 1800 03/16/15 0045 03/16/15 0458  HGB 10.4*  --   --   HCT 31.9*  --   --   PLT 177  --   --   LABPROT 25.6*  --  26.5*  INR 2.37*  --  2.48*  CREATININE 2.05*  --  2.00*  TROPONINI  --  <0.03 <0.03    Estimated Creatinine Clearance: 40.7 mL/min (by C-G formula based on Cr of 2).   Medical History: Past Medical History  Diagnosis Date  . Diabetes mellitus    . Hyperlipidemia    . Hypertension    . Asthma   . Allergic rhinitis   . GERD (gastroesophageal reflux disease)   . BPH (benign prostatic hypertrophy)     s/p thermo therapy, has bladdero utlet obst. and instablitiy, incontient  . Anemia   . Chronic constipation   . Sick sinus syndrome     a. s/p STJ dual chamber pacemaker  . Paroxysmal atrial fibrillation     a. status post direct cardioversion b. anticoagulated with Warfarin  . Obesity   . Sleep apnea     noncomplicance w/ CPAP  . Gastroparesis     dx in 2006 through gastric scan  . Gynecomastia     nomral mammograms 11/2008  . Chronic kidney disease   . Globus sensation     2012 EGD, Ba swallow, both negative.  MBSS 2012 negative for dysphagia.   Marland Kitchen Atypical atrial flutter     a. CL b. able to be pace terminated    Assessment: 81 y.o. male admitted with SOB/cehst pain, h/o Afib, to continue Coumadin pta 7.5 mg daily at [facility] for Afib. Pharmacy to dose inpatient. INR therapeutic,  stable 2.37>>2.48. SQ heparin ordered per MD d/c'd. Hg moderately low 10.4, plt wnl. No bleed issues documented. Last dose unknown. No dose ordered last night - will order for tonight.   Goal of Therapy:  INR 2-3 Monitor platelets by anticoagulation protocol: Yes   Plan:  Resume home dose - Warfarin 7.5mg  daily for now D/c sq heparin Daily INR for now Mon s/sx bleeding  Babs Bertin, PharmD Clinical Pharmacist Pager [phone] 03/16/2015 9:06 AM

## 2015-03-16 NOTE — Consult Note (Signed)
Reason for Consult: SOB/CP  Requesting Physician: Deirdre Priest  HPI: Mr. Galentine is a 81 year old mildly overweight married African-American male admitted on 822 with chest pain and shortness of breath. His past medical history is notable for treated hypertension, diabetes, hyperlipidemia. He has chronic fascicular block and atrial fibrillation status post permanent transvenous pacemaker insertion by Dr. [name] 06/07/12 followed quickly by [facility]. He is on Coumadin anticoagulation. He had a normal cardiac catheterization 2003 after an abnormal Cardiolite. His last echo performed 05/30/12 revealed an EF of 60-65% with grade 2 diastolic dysfunction. He also has been followed by Dr. Myra Gianotti in the past for diabetic foot ulcer with normal ABIs. On this admission, his chest x-ray shows cardiomegaly with mild interstitial edema although his BNP is 195. Troponins are negative. He has a paced rhythm. He is on torsemide at home 50 mg a day and is getting 60 mg of furosemide IV twice a day with minimal fluid output. He has chronic renal insufficiency with a creatinine in the 2 range.  Problem List: Patient Active Problem List   Diagnosis Date Noted  . Shortness of breath 03/15/2015  . CHF (congestive heart failure) 03/15/2015  . SOB (shortness of breath) 03/15/2015  . Pacemaker-St.Jude 06/10/2012  . 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 Constipation, chronic 05/30/2012  . Warfarin anticoagulation 05/30/2012  . Obesity 05/30/2012  . OSA (obstructive sleep apnea)-noncompliant with CPAP 05/30/2012  . Junctional rhythm 05/29/2012  . Bifascicular block 05/29/2012  . CKD (chronic kidney disease) stage 3, GFR 30-59 ml/min 05/29/2012  . Diabetes mellitus, type II 11/26/2009  . Hyperlipidemia 11/26/2009  . ANEMIA-NOS 11/26/2009  . Essential hypertension 11/26/2009  . ATRIAL FIBRILLATION, PAROXYSMAL 11/26/2009  . ALLERGIC RHINITIS 11/26/2009  . ASTHMA 08/16/2006  . GERD 08/16/2006  .  GASTROPARESIS 08/16/2006  . ABNORMAL RESULT, FUNCTION STUDY, LIVER 08/16/2006    PMHx:  Past Medical History  Diagnosis Date  . Diabetes mellitus    . Hyperlipidemia    . Hypertension    . Asthma   . Allergic rhinitis   . GERD (gastroesophageal reflux disease)   . BPH (benign prostatic hypertrophy)     s/p thermo therapy, has bladdero utlet obst. and instablitiy, incontient  . Anemia   . Chronic constipation   . Sick sinus syndrome     a. s/p STJ dual chamber pacemaker  . Paroxysmal atrial fibrillation     a. status post direct cardioversion b. anticoagulated with Warfarin  . Obesity   . Sleep apnea     noncomplicance w/ CPAP  . Gastroparesis     dx in 2006 through gastric scan  . Gynecomastia     nomral mammograms 11/2008  . Chronic kidney disease   . Globus sensation     2012 EGD, Ba swallow, both negative.  MBSS 2012 negative for dysphagia.   Marland Kitchen Atypical atrial flutter     a. CL b. able to be pace terminated   Past Surgical History  Procedure Laterality Date  . Cataract extraction      bilateral  . Rotator cuff repair      left  . Cardiac catheterization  2003    normal, left ventricular function (done after an abnormal cardiolite)  . Tumor removal      off arms  . Permanent pacemaker insertion N/A 06/07/2012    STJ Assurity dual chamber pacemaker implanted by Dr [name] for SSS    FAMHx: Family History  Problem Relation Age of Onset  . Colon cancer  Neg Hx   . Prostate cancer Maternal Uncle   . Heart disease Paternal Uncle     x 3, paternal aunt    SOCHx:  reports that he quit smoking about 43 years ago. His smoking use included Cigarettes. He has quit using smokeless tobacco. His smokeless tobacco use included Chew. He reports that he does not drink alcohol or use illicit drugs.  ALLERGIES: Allergies  Allergen Reactions  . Neosporin Original [Bacitracin-Neomycin-Polymyxin]     Rinaldo Cloud McWhite from [facility] [phone] states pt is allergic to  Neosporin and Polysporin ointments.    ROS: Pertinent items are noted in HPI.  HOME MEDICATIONS: Prescriptions prior to admission  Medication Sig Dispense Refill Last Dose  . albuterol (VENTOLIN HFA) 108 (90 BASE) MCG/ACT inhaler Inhale 2 puffs into the lungs every 4 (four) hours as needed. For shortness of breath   unknown at unknown  . amitriptyline (ELAVIL) 25 MG tablet Take 1 tablet (25 mg total) by mouth at bedtime. 30 tablet 1 unknown at unknown  . Azelastine-Fluticasone 137-50 MCG/ACT SUSP Place 2 sprays into the nose 2 (two) times daily as needed.    unknown at unknown  . Bepotastine Besilate 1.5 % SOLN      . cetaphil (CETAPHIL) lotion Apply 1 application topically daily.    unknown at unknown  . cetirizine (ZYRTEC) 10 MG tablet Take 10 mg by mouth daily.   unknown at unknown  . donepezil (ARICEPT) 10 MG tablet Take 10 mg by mouth at bedtime.     unknown at unknown  . doxazosin (CARDURA) 2 MG tablet Take 1 tablet (2 mg total) by mouth at bedtime. 30 tablet 0 unknown at unknown  . ferrous sulfate 325 (65 FE) MG tablet Take 325 mg by mouth daily with breakfast.     unknown at unknown  . fluticasone (FLONASE) 50 MCG/ACT nasal spray Place 2 sprays into both nostrils daily.   unknown at unknown  . Fluticasone-Salmeterol (ADVAIR) 100-50 MCG/DOSE AEPB Inhale 1 puff into the lungs 2 (two) times daily. Rinse mouth and spit after each use   unknown at unknown  . Foot Care Products (PREVALON HEEL PROTECTOR) MISC Apply the boot to the left foot to protect the heel at night and during the day when patient is in his chair 1 each 0 unknown at unknown  . gabapentin (NEURONTIN) 100 MG capsule Take 100 mg by mouth 3 (three) times daily.   unknown at unknown  . HYDROcodone-acetaminophen (NORCO/VICODIN) 5-325 MG per tablet Take 1 tablet by mouth every 6 (six) hours as needed for severe pain.    unknown at unknown  . insulin aspart (NOVOLOG) 100 UNIT/ML injection Inject into the skin 3 (three) times daily  before meals. 10 units before breakfast and lunch , 12units before dinner   unknown at unknown  . insulin glargine (LANTUS) 100 UNIT/ML injection 27 units in the morning and 50 units at bedtime   unknown at unknown  . loratadine (CLARITIN) 10 MG tablet Take 10 mg by mouth daily.   unknown at unknown  . magnesium hydroxide (MILK OF MAGNESIA) 400 MG/5ML suspension Take 30 mLs by mouth daily as needed for mild constipation.   unknown at unknown  . meloxicam (MOBIC) 7.5 MG tablet Take 7.5 mg by mouth daily.   unknown at unknown  . metoCLOPramide (REGLAN) 10 MG tablet Take 0.5 tablets (5 mg total) by mouth at bedtime. 30 tablet 11 unknown at unknown  . metoprolol tartrate (LOPRESSOR) 25 MG tablet Take  1 tablet (25 mg total) by mouth 2 (two) times daily. 180 tablet 3 unknown at unknown  . mirabegron ER (MYRBETRIQ) 50 MG TB24 tablet Take 50 mg by mouth daily.   uunknown at unknown  . montelukast (SINGULAIR) 10 MG tablet Take 10 mg by mouth at bedtime.    unknown at unknown  . Multiple Vitamin (DAILY VITE) TABS Take by mouth daily.   unknown at unknown  . omeprazole (PRILOSEC) 40 MG capsule Take 40 mg by mouth daily.   unknown at unknown  . polyethylene glycol (MIRALAX / GLYCOLAX) packet Take 17 g by mouth as directed. Take on Mon.. Wed. and Fri.   unknown at unknown  . ranitidine (ZANTAC) 150 MG capsule Take 150 mg by mouth 2 (two) times daily.   unknown at unknown  . sertraline (ZOLOFT) 25 MG tablet Take 25 mg by mouth daily.     Taking  . simvastatin (ZOCOR) 40 MG tablet Take 40 mg by mouth daily at 6 PM.   unknown at unknown  . tamsulosin (FLOMAX) 0.4 MG CAPS capsule Take 0.4 mg by mouth daily.   unknown at unknown  . torsemide (DEMADEX) 100 MG tablet Take 50 mg by mouth daily.     unknown at unknown  . vitamin C (ASCORBIC ACID) 500 MG tablet Take 500 mg by mouth daily.   unknown at unknown  . Vitamin D, Ergocalciferol, (DRISDOL) 50000 UNITS CAPS Take 50,000 Units by mouth every 7 (seven) days. Fridays    unknown at unknown  . warfarin (COUMADIN) 7.5 MG tablet Take 7.5 mg by mouth one time only at 6 PM.   unknown at unknown    HOSPITAL MEDICATIONS: I have reviewed the patient's current medications.  VITALS: Blood pressure 141/44, pulse 60, temperature 98.4 F (36.9 C), temperature source Oral, resp. rate 18, height 5\' 10"  (1.778 m), weight 287 lb 8 oz (130.409 kg), SpO2 95 %.  INPUT/OUTPUT I/O last 3 completed shifts: In: -  Out: 725 [Urine:725] Total I/O In: 120 [P.O.:120] Out: -     PHYSICAL EXAM: General appearance: alert and no distress Neck: no adenopathy, no carotid bruit, no JVD, supple, symmetrical, trachea midline and thyroid not enlarged, symmetric, no tenderness/mass/nodules Lungs: clear to auscultation bilaterally Heart: regular rate and rhythm, S1, S2 normal, no murmur, click, rub or gallop Extremities: 1+ lower extremity edema bilaterally  LABS:  BMP  Recent Labs  03/15/15 1800 03/16/15 0458  NA 142 138  K 5.7* 4.4  CL 106 102  CO2 27 25  GLUCOSE 48* 249*  BUN 32* 30*  CREATININE 2.05* 2.00*  CALCIUM 8.6* 8.5*  GFRNONAA 29* 30*  GFRAA 34* 35*    CBC  Recent Labs Lab 03/15/15 1800  WBC 8.2  RBC 3.53*  HGB 10.4*  HCT 31.9*  PLT 177  MCV 90.4    HEMOGLOBIN A1C Lab Results  Component Value Date   HGBA1C 7.9* 06/26/2013   MPG 146* 05/31/2012    Cardiac Panel (last 3 results)  Recent Labs  03/16/15 0045 03/16/15 0458  TROPONINI <0.03 <0.03    BNP (last 3 results) No results for input(s): PROBNP in the last 8760 hours.  TSH No results for input(s): TSH in the last 8760 hours.  CHOLESTEROL No results for input(s): CHOL in the last 8760 hours.  Hepatic Function Panel  Recent Labs  03/15/15 1942  PROT 6.5  ALBUMIN 3.2*  AST 65*  ALT 20  ALKPHOS 93  BILITOT 1.8*  BILIDIR 0.7*  IBILI  1.1*   Tele: V paced rhythm  IMAGING: Dg Chest 2 View  03/15/2015   CLINICAL DATA:  Shortness of breath today, history  hypertension, type II diabetes mellitus, hyperlipidemia, asthma, former smoker  EXAM: CHEST  2 VIEW  COMPARISON:  06/08/2012  FINDINGS: LEFT subclavian transvenous pacemaker leads project at RIGHT atrium and RIGHT ventricle, appearing unchanged from previous exam.  Enlargement of cardiac silhouette with pulmonary vascular congestion.  Persistent small pleural effusions.  Chronic elevation of RIGHT diaphragm.  No definite pulmonary edema, segmental consolidation or pneumothorax.  Bones unremarkable.  IMPRESSION: Enlargement of cardiac silhouette with pulmonary vascular congestion.  Small bibasilar effusions.   Electronically Signed   By: Ulyses Southward M.D.   On: 03/15/2015 17:44    IMPRESSION: 1. Shortness of breath: Mr. Romeo Apple has history of diastolic dysfunction by echo performed 05/30/12. He is on torsemide at home has chronic renal insufficiency with a creatinine in the 2 range. His BNP was 195 and has had minimal urine output. He says she's been short of breath for months both at rest and with exertion. There is no convincing evidence of volume overload or CHF at this time although he may benefit from a slightly higher diuretic  Dose 2. Chest pain: His troponins are negative, and he had a normal cath in 2003 after an abnormal Cardiolite. His pain does not sound cardiac. 3. Hypertension: His hypertension is under good control today on metoprolol 4. Atrial fibrillation: He has a permanent transvenous pacemaker in place implanted 06/07/12 by [name]. His rhythm is paced. He is on Coumadin anticoagulation with a therapeutic INR   RECOMMENDATION: 1. At this point, I'm not convinced that his shortness of breath is necessarily cardiac in nature. Especially in light of the fact that his BNP is low and he has no signs of volume overload. I recommend repeating a 2-D echocardiogram. We can increase his Lasix from 60-80 mg IV twice a day and follow his creatinine and fluid balance..  Time Spent Directly with  Patient: 45 minutes  Nanetta Batty 03/16/2015, 10:04 AM

## 2015-03-16 NOTE — Progress Notes (Signed)
Utilization review completed.  

## 2015-03-16 NOTE — Progress Notes (Signed)
ANTICOAGULATION CONSULT NOTE - Initial Consult  Pharmacy Consult for Coumadin Indication: atrial fibrillation  Allergies  Allergen Reactions  . Neosporin Original [Bacitracin-Neomycin-Polymyxin]     Rinaldo Cloud McWhite from [facility] [phone] states pt is allergic to Neosporin and Polysporin ointments.    Patient Measurements: Weight: 287 lb 8 oz (130.409 kg)  Vital Signs: Temp: 99 F (37.2 C) (08/22 2208) Temp Source: Oral (08/22 2208) BP: 155/42 mmHg (08/22 2208) Pulse Rate: 59 (08/22 2208)  Labs:  Recent Labs  03/15/15 1800  HGB 10.4*  HCT 31.9*  PLT 177  LABPROT 25.6*  INR 2.37*  CREATININE 2.05*    Estimated Creatinine Clearance: 40 mL/min (by C-G formula based on Cr of 2.05).   Medical History: Past Medical History  Diagnosis Date  . Diabetes mellitus    . Hyperlipidemia    . Hypertension    . Asthma   . Allergic rhinitis   . GERD (gastroesophageal reflux disease)   . BPH (benign prostatic hypertrophy)     s/p thermo therapy, has bladdero utlet obst. and instablitiy, incontient  . Anemia   . Chronic constipation   . Sick sinus syndrome     a. s/p STJ dual chamber pacemaker  . Paroxysmal atrial fibrillation     a. status post direct cardioversion b. anticoagulated with Warfarin  . Obesity   . Sleep apnea     noncomplicance w/ CPAP  . Gastroparesis     dx in 2006 through gastric scan  . Gynecomastia     nomral mammograms 11/2008  . Chronic kidney disease   . Globus sensation     2012 EGD, Ba swallow, both negative.  MBSS 2012 negative for dysphagia.   Marland Kitchen Atypical atrial flutter     a. CL b. able to be pace terminated    Medications:  See Medication History Coumadin 7.5 mg daily  Assessment: 81 y.o. male admitted with SOB/cehst pain, h/o Afib, to continue Coumadin  Goal of Therapy:  INR 2-3 Monitor platelets by anticoagulation protocol: Yes   Plan:  Daily INR  Tison Leibold, Gary Fleet 03/16/2015,12:17 AM

## 2015-03-16 NOTE — Progress Notes (Signed)
Inpatient Diabetes Program Recommendations  AACE/ADA: New Consensus Statement on Inpatient Glycemic Control (2013)  Target Ranges:  Prepandial:   less than 140 mg/dL      Peak postprandial:   less than 180 mg/dL (1-2 hours)      Critically ill patients:  140 - 180 mg/dL   Review of Glycemic Control:  Results for DAYLEN, LIPSKY (MRN [id number]) as of 03/16/2015 10:22  Ref. Range 03/15/2015 19:09 03/15/2015 19:34 03/15/2015 22:43 03/16/2015 06:30  Glucose-Capillary Latest Ref Range: 65-99 mg/dL 41 (LL) 119 (H) 147 (H) 241 (H)    Diabetes history: Type 2 diabetes Outpatient Diabetes medications: Lantus 22 units q AM and Lantus 40 units q HS, Novolog 10 units with breakfast and lunch and Novolog 12 units with dinner Current orders for Inpatient glycemic control:  Novolog moderate tid with meals and HS  Please consider restarting a portion of patient's home insulin.  Consider Lantus 30 units q HS and Novolog 5 units tid with meals (Hold if patient eats less than 50%). May consider checking A1C to determine past 2-3 month glycemic control.   Thanks, Beryl Meager, RN, BC-ADM Inpatient Diabetes Coordinator Pager [phone] (8a-5p)

## 2015-03-16 NOTE — Consult Note (Addendum)
WOC wound consult note Reason for Consult: Consult requested for left great toe wound.  Pt is followed by a podiatrist as an outpatient and has been using antibiotic ointment and has serial debridements performed, according to the EMR. Wound type: Chronic full thickness wound to tip of toe Measurement: .2X.2cm Wound bed: dry eschar, no odor or drainage Dressing procedure/placement/frequency: Continue present plan of care with antibiotic ointment.  Pt can resume follow-up with podiatrist after discharge. Please re-consult if further assistance is needed.  Thank-you,  Cammie Mcgee MSN, RN, CWOCN, [place], CNS [phone]

## 2015-03-16 NOTE — Progress Notes (Signed)
Family Medicine Teaching Service Daily Progress Note Intern Pager: [phone]  Patient name: Tyler [name] Medical record number: [id number] Date of birth: 1933/11/18 Age: 81 y.o. Gender: male  Primary Care Provider: [name], MD Consultants: Cards Code Status: Full  Pt Overview and Major Events to Date:  8/22: Admit for SOB, abdominal pain  Assessment and Plan:  Tyler [name] is a 81 y.o. male presenting with dyspnea . PMH is significant for CHF, SSS s/p pacemaker, a flutter/fib on warfarin, CKD, HTN, T2DM, HLD, BPH, Asthma, OSA, anemia  # SOB/CP: concern for worsening HF, less likely pneumonia - telemetry - continue lasix  IV  - cycle troponins - daily weights - strict I&Os - f/u echo today - Cards c/s  # Hyperkalemia: Resolved -follow BMPs  # CKD: Cr 2.05 on admission, prior value 1.9 in 06/2013 and 1.8 02/2013, likely baseline - follow Cr  # UTI: was on treatment with macrobid as outpatient for this starting last week. UA with large leukocytes, microscopy TNTC WBC and few bacteria. S/p ceftriaxone in ED. - urine cx - continue ctx   # T2DM: on lantus 22u in AM and 40u qHS  - CBG monitoring qAC qHS - SSI  # HTN: stable  - hold metoprolol in acute exacerbation  # BPH:  - on both flomax and cardura... Will continue both for now and discuss in AM with patient - d/c amitriptyline as can potentiate urinary retention - continue myrbetriq  # Afib/flutter on warfarin - continue warfarin per pharm  FEN/GI: diet HH/carb mod / saline lock Prophylaxis: heparin sq  Disposition: Pending clinical improvement  Subjective:  Improved SOB, chest pain and abd pain  Objective: Temp:  [98.4 F (36.9 C)-99.3 F (37.4 C)] 98.4 F (36.9 C) (08/23 0540) Pulse Rate:  [58-61] 60 (08/23 0540) Resp:  [18-30] 18 (08/23 0540) BP: (130-161)/(38-72) 141/44 mmHg (08/23 0540) SpO2:  [94 %-100 %] 95 % (08/23 0900) Weight:  [275 lb (124.739 kg)-287 lb 8 oz  (130.409 kg)] 287 lb 8 oz (130.409 kg) (08/23 0540) Physical Exam: General: NAD, sitting up in bed eating breakfast Cardiovascular: RRR, no murmurs Respiratory: bibasilar crackles, normal WOB Abdomen: distended, taught, no fluid wave, + BS, non tender Extremities: hairless bilateral LE extremities, few ulcers, left great toes gangrenous ulcer not draining  Laboratory:  Recent Labs Lab 03/15/15 1800  WBC 8.2  HGB 10.4*  HCT 31.9*  PLT 177    Recent Labs Lab 03/15/15 1800 03/15/15 1942 03/16/15 0458  NA 142  --  138  K 5.7*  --  4.4  CL 106  --  102  CO2 27  --  25  BUN 32*  --  30*  CREATININE 2.05*  --  2.00*  CALCIUM 8.6*  --  8.5*  PROT  --  6.5  --   BILITOT  --  1.8*  --   ALKPHOS  --  93  --   ALT  --  20  --   AST  --  65*  --   GLUCOSE 48*  --  249*      Imaging/Diagnostic Tests:   CXR 8/22  IMPRESSION: Enlargement of cardiac silhouette with pulmonary vascular congestion.  Small bibasilar effusions.  Bonney Aid, MD 03/16/2015, 9:26 AM PGY-2, [facility] FPTS Intern pager: [phone], text pages welcome

## 2015-03-16 NOTE — Care Management Note (Signed)
Case Management Note Donn Pierini RN, BSN Unit 2W-Case Manager [phone]  Patient Details  Name: Tyler [name] MRN: [id number] Date of Birth: [birth year]  Subjective/Objective:      Pt admitted with fluid overload, SOB              Action/Plan: PTA pt lived at  ALF- [facility]- CSW consulted for placement needs  Expected Discharge Date:                  Expected Discharge Plan:  Assisted Living / Rest Home  In-House Referral:  Clinical Social Work  Discharge planning Services  CM Consult  Post Acute Care Choice:    Choice offered to:     DME Arranged:    DME Agency:     HH Arranged:    HH Agency:     Status of Service:  In process, will continue to follow  Medicare Important Message Given:    Date Medicare IM Given:    Medicare IM give by:    Date Additional Medicare IM Given:    Additional Medicare Important Message give by:     If discussed at Long Length of Stay Meetings, dates discussed:    Additional Comments:  Darrold Span, RN 03/16/2015, 11:02 AM

## 2015-03-16 NOTE — Discharge Summary (Signed)
Family Medicine Teaching [facility] Discharge Summary  Patient name: Tyler [name] Medical record number: [id number] Date of birth: [birth year] Age: 81 y.o. Gender: male Date of Admission: 03/15/2015  Date of Discharge: 03/17/2015 Admitting Physician: Carney Living, MD  Primary Care Provider: [name], MD Consultants: Cardiology  Indication for Hospitalization:  Shortness of breath Chest pain  Discharge Diagnoses/Problem List:  Patient Active Problem List   Diagnosis Date Noted  . Shortness of breath 03/15/2015  . CHF (congestive heart failure) 03/15/2015  . SOB (shortness of breath) 03/15/2015  . Pacemaker-St.Jude 06/10/2012  . 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 Constipation, chronic 05/30/2012  . Warfarin anticoagulation 05/30/2012  . Obesity 05/30/2012  . OSA (obstructive sleep apnea)-noncompliant with CPAP 05/30/2012  . Junctional rhythm 05/29/2012  . Bifascicular block 05/29/2012  . CKD (chronic kidney disease) stage 3, GFR 30-59 ml/min 05/29/2012  . Diabetes mellitus, type II 11/26/2009  . Hyperlipidemia 11/26/2009  . ANEMIA-NOS 11/26/2009  . Essential hypertension 11/26/2009  . ATRIAL FIBRILLATION, PAROXYSMAL 11/26/2009  . ALLERGIC RHINITIS 11/26/2009  . ASTHMA 08/16/2006  . GERD 08/16/2006  . GASTROPARESIS 08/16/2006  . ABNORMAL RESULT, FUNCTION STUDY, LIVER 08/16/2006     Disposition: ALF  Discharge Condition: Stable  Discharge Exam:   Temp: [98 F (36.7 C)-98.2 F (36.8 C)] 98.1 F (36.7 C) (08/24 0543) Pulse Rate: [60-61] 60 (08/24 0543) Resp: [18-20] 18 (08/24 0543) BP: (134-172)/(43-53) 153/43 mmHg (08/24 0543) SpO2: [94 %-98 %] 94 % (08/24 0543) Weight: [279 lb 1.6 oz (126.599 kg)] 279 lb 1.6 oz (126.599 kg) (08/24 0543) Physical Exam: General: NAD, sitting up in bed eating breakfast Cardiovascular: RRR, no murmurs Respiratory: Clear lung sounds, exam limited by pt's cecreased mobility normal WOB at rest, but increased  WOB after mobilization Abdomen: distended, taught, no fluid wave, + BS, mildly tender diffusely Extremities: hairless bilateral LE extremities, few ulcers, left great toes gangrenous ulcer not draining  Brief Hospital Course:  Tyler [name] is a 81 y.o. male admitted with dyspnea . PMH is significant for CHF, SSS s/p pacemaker, a flutter/fib on warfarin, CKD, HTN, T2DM, HLD, BPH, Asthma, OSA, anemia  On admission his presentation was consistent with being volume overloaded concerning for CHF exacerbation. Cardiology was consulted and recommended continued diuresis. Echo was performed which was largely unchanged from previous echocardiogram in 2013.  His shortness of breath mildly improved but he was noted to have continued abdominal swelling of unclear etioology likely contributing to his shortness of breath. He had an abdominal US to evaluate it and did not show any acute process.  He also had a UTI which was being treated as as outpt with initiation of treatment with macrobid on 8/19. He was maintained on ceftriaxone IV while inpatient then transitioned to oral Keflex. He was discharged to continue macrobid for one additional day to complete a 7 day course total. His amitriptyline was discontinued due to concern for worsening urinary retention  Issues for Follow Up:  1. Completion of macobid for urinary tract infection 2. Discontinued amitriptyline 3. Concern for multiple agents for urinary retention, Cardura and flomax. Consider tailoring as an outpatient  Significant Procedures:  Echo cardiogram: 55-60%. No RWMA. Grade 2 diastolic dysfunction.   Study Conclusions  - Left ventricle: The cavity size was normal. Wall thickness was normal. Systolic function was normal. The estimated ejection fraction was in the range of 55% to 60%. Wall motion was normal; there were no regional wall motion abnormalities. Features are consistent with a pseudonormal left ventricular filling  pattern,  with concomitant abnormal relaxation and increased filling pressure (grade 2 diastolic dysfunction). Doppler parameters are consistent with high ventricular filling pressure. - Right ventricle: The cavity size was mildly dilated. - Right atrium: The atrium was mildly dilated. - Atrial septum: There was an atrial septal aneurysm.  Impressions:  - Technically difficult; definity used; normal LV systolic function; grade 2 diastolic dysfunction; mild RAE and RVE.  Abdominal U/S: 1. No cholelithiasis or sonographic evidence of acute cholecystitis. 2. No ascites.    Significant Labs and Imaging:   Recent Labs Lab 03/15/15 1800 03/17/15 0441  WBC 8.2 7.5  HGB 10.4* 10.0*  HCT 31.9* 31.7*  PLT 177 168    Recent Labs Lab 03/15/15 1800 03/15/15 1942 03/16/15 0458 03/17/15 0441  NA 142  --  138 134*  K 5.7*  --  4.4 5.0  CL 106  --  102 99*  CO2 27  --  25 24  GLUCOSE 48*  --  249* 411*  BUN 32*  --  30* 34*  CREATININE 2.05*  --  2.00* 2.21*  CALCIUM 8.6*  --  8.5* 8.3*  ALKPHOS  --  93  --   --   AST  --  65*  --   --   ALT  --  20  --   --   ALBUMIN  --  3.2*  --   --       Results/Tests Pending at Time of Discharge:  Urine Culture  Discharge Medications:    Medication List    STOP taking these medications        amitriptyline 25 MG tablet  Commonly known as:  ELAVIL     metoCLOPramide 10 MG tablet  Commonly known as:  REGLAN      TAKE these medications        Azelastine-Fluticasone 137-50 MCG/ACT Susp  Place 2 sprays into the nose 2 (two) times daily as needed.     Bepotastine Besilate 1.5 % Soln     cetaphil lotion  Apply 1 application topically daily.     cetirizine 10 MG tablet  Commonly known as:  ZYRTEC  Take 10 mg by mouth daily.     DAILY VITE Tabs  Take by mouth daily.     donepezil 10 MG tablet  Commonly known as:  ARICEPT  Take 10 mg by mouth at bedtime.     doxazosin 2 MG tablet  Commonly known as:  CARDURA  Take 1 tablet  (2 mg total) by mouth at bedtime.     ferrous sulfate 325 (65 FE) MG tablet  Take 325 mg by mouth daily with breakfast.     fluticasone 50 MCG/ACT nasal spray  Commonly known as:  FLONASE  Place 2 sprays into both nostrils daily.     Fluticasone-Salmeterol 100-50 MCG/DOSE Aepb  Commonly known as:  ADVAIR  Inhale 1 puff into the lungs 2 (two) times daily. Rinse mouth and spit after each use     gabapentin 100 MG capsule  Commonly known as:  NEURONTIN  Take 100 mg by mouth 3 (three) times daily.     HYDROcodone-acetaminophen 5-325 MG per tablet  Commonly known as:  NORCO/VICODIN  Take 1 tablet by mouth every 6 (six) hours as needed for severe pain.     insulin aspart 100 UNIT/ML injection  Commonly known as:  novoLOG  Inject into the skin 3 (three) times daily before meals. 10 units before breakfast and lunch , 12units before  dinner     insulin glargine 100 UNIT/ML injection  Commonly known as:  LANTUS  27 units in the morning and 50 units at bedtime     loratadine 10 MG tablet  Commonly known as:  CLARITIN  Take 10 mg by mouth daily.     magnesium hydroxide 400 MG/5ML suspension  Commonly known as:  MILK OF MAGNESIA  Take 30 mLs by mouth daily as needed for mild constipation.     meloxicam 7.5 MG tablet  Commonly known as:  MOBIC  Take 7.5 mg by mouth daily.     metoprolol tartrate 25 MG tablet  Commonly known as:  LOPRESSOR  Take 1 tablet (25 mg total) by mouth 2 (two) times daily.     mirabegron ER 50 MG Tb24 tablet  Commonly known as:  MYRBETRIQ  Take 50 mg by mouth daily.     montelukast 10 MG tablet  Commonly known as:  SINGULAIR  Take 10 mg by mouth at bedtime.     omeprazole 40 MG capsule  Commonly known as:  PRILOSEC  Take 40 mg by mouth daily.     polyethylene glycol packet  Commonly known as:  MIRALAX / GLYCOLAX  Take 17 g by mouth as directed. Take on Mon.. Wed. and Fri.     PREVALON HEEL PROTECTOR Misc  Apply the boot to the left foot to  protect the heel at night and during the day when patient is in his chair     ranitidine 150 MG capsule  Commonly known as:  ZANTAC  Take 150 mg by mouth 2 (two) times daily.     sertraline 25 MG tablet  Commonly known as:  ZOLOFT  Take 25 mg by mouth daily.     simvastatin 40 MG tablet  Commonly known as:  ZOCOR  Take 40 mg by mouth daily at 6 PM.     tamsulosin 0.4 MG Caps capsule  Commonly known as:  FLOMAX  Take 0.4 mg by mouth daily.     torsemide 100 MG tablet  Commonly known as:  DEMADEX  Take 50 mg by mouth daily.     VENTOLIN HFA 108 (90 BASE) MCG/ACT inhaler  Generic drug:  albuterol  Inhale 2 puffs into the lungs every 4 (four) hours as needed. For shortness of breath     vitamin C 500 MG tablet  Commonly known as:  ASCORBIC ACID  Take 500 mg by mouth daily.     Vitamin D (Ergocalciferol) 50000 UNITS Caps capsule  Commonly known as:  DRISDOL  Take 50,000 Units by mouth every 7 (seven) days. Fridays     warfarin 7.5 MG tablet  Commonly known as:  COUMADIN  Take 7.5 mg by mouth one time only at 6 PM.        Discharge Instructions: Please refer to Patient Instructions section of EMR for full details.  Patient was counseled important signs and symptoms that should prompt return to medical care, changes in medications, dietary instructions, activity restrictions, and follow up appointments.   Follow-Up Appointments: Follow-up Information    Schedule an appointment as soon as possible for a visit with [name], MD.   Specialty:  Family Medicine   Contact information:   [address] [place] [address] [phone]       Bonney Aid, MD 03/17/2015, 5:15 PM PGY-2, [facility]

## 2015-03-16 NOTE — Progress Notes (Signed)
Echocardiogram 2D Echocardiogram has been performed.  Dorothey Baseman 03/16/2015, 12:32 PM

## 2015-03-16 NOTE — Evaluation (Addendum)
Occupational Therapy Evaluation Patient Details Name: Tyler [name] MRN: [id number] DOB: 1934-02-09 Today's Date: 03/16/2015    History of Present Illness 81 y.o. male admitted with dyspnea . PMH is significant for CHF, SSS s/p pacemaker, a flutter/fib on warfarin, CKD, HTN, T2DM, HLD, BPH, Asthma, OSA, anemia   Clinical Impression   Pt admitted with above. Pt requiring assist with ADLs/IADLs, PTA. Feel pt will benefit from acute OT to increase independence and strength prior to d/c. Recommending HHOT as long as ALF can provide necessary assist. If not, then SNF would be next option.    Follow Up Recommendations  Home health OT;Supervision/Assistance - 24 hour (as long as ALF can provide necessary assist)   Equipment Recommendations  Other (comment) (AE)    Recommendations for Other Services       Precautions / Restrictions Precautions Precautions: Fall Restrictions Weight Bearing Restrictions: No      Mobility Bed Mobility Overal bed mobility: Needs Assistance Bed Mobility: Supine to Sit;Sit to Supine     Supine to sit: Mod assist Sit to supine: Min assist   General bed mobility comments: assist with trunk and hips to come to sitting position.  Transfers                 General transfer comment: not assessed. Did not feel safe attempting to stand pt with +1 assist.    Balance      Pt sat EOB with no LOB during functional tasks.                                       ADL Overall ADL's : Needs assistance/impaired     Grooming: Wash/dry face;Sitting;Minimal assistance Grooming Details (indicate cue type and reason): no physical assist needed to wash face Upper Body Bathing: Sitting;Minimal assitance Upper Body Bathing Details (indicate cue type and reason): OT assisted in washing more thoroughly under left armpit         Lower Body Dressing: Maximal assistance;Total assistance;Bed level;Sitting/lateral leans                  General ADL Comments: Pt sat EOB and performed ADL tasks. Suggested a long sponge for pt to wash LB. Educated on what pt could use for toilet aide for hygiene.     Vision     Perception     Praxis      Pertinent Vitals/Pain Pain Assessment: 0-10 Pain Score: 3  Pain Location: abdomen Pain Descriptors / Indicators: Sore Pain Intervention(s): Monitored during session     Hand Dominance     Extremity/Trunk Assessment Upper Extremity Assessment Upper Extremity Assessment: Generalized weakness (limited shoulder ROM due to shoulder injuries- able to withstand very minimal resistance with shoulder flexion)   Lower Extremity Assessment Lower Extremity Assessment: Defer to PT evaluation       Communication Communication Communication: No difficulties   Cognition Arousal/Alertness: Awake/alert Behavior During Therapy: Flat affect Overall Cognitive Status: Within Functional Limits for tasks assessed                     General Comments       Exercises       Shoulder Instructions      Home Living Family/patient expects to be discharged to:: Assisted living  Home Equipment: Walker - 2 wheels;Walker - 4 wheels;Shower seat - built in;Grab bars - toilet;Grab bars - tub/shower;Adaptive equipment;Wheelchair - Equities trader: Reacher   Comments: Pt has elevated toilet and walk-in shower        Prior Functioning/Environment Level of Independence: Needs assistance  Gait / Transfers Assistance Needed: reports 2 persron assist with transfers within last week ADL's / Homemaking Assistance Needed: assist with meal prep, cleaning, and bathing/dressing as well as toilet hygiene        OT Diagnosis: Generalized weakness;Acute pain   OT Problem List: Decreased strength;Obesity;Decreased range of motion;Decreased activity tolerance;Impaired UE functional use;Pain;Decreased knowledge of precautions;Decreased knowledge of  use of DME or AE   OT Treatment/Interventions: Self-care/ADL training;Therapeutic exercise;DME and/or AE instruction;Balance training;Patient/family education;Therapeutic activities    OT Goals(Current goals can be found in the care plan section) Acute Rehab OT Goals Patient Stated Goal: not stated OT Goal Formulation: With patient/family Time For Goal Achievement: 03/23/15 Potential to Achieve Goals: Fair ADL Goals Pt Will Perform Upper Body Dressing: with set-up;sitting Pt Will Perform Lower Body Dressing: with mod assist;sit to/from stand;with adaptive equipment;sitting/lateral leans Pt Will Transfer to Toilet: stand pivot transfer;grab bars;with mod assist (elevated toilet) Pt Will Perform Toileting - Clothing Manipulation and hygiene: with mod assist;with adaptive equipment;sit to/from stand;sitting/lateral leans  OT Frequency: Min 2X/week   Barriers to D/C:            Co-evaluation              End of Session Nurse Communication: Other (comment);Mobility status (do not attempt standing alone)  Activity Tolerance: Patient tolerated treatment well Patient left: in bed;with call bell/phone within reach;with bed alarm set   Time: 1610-9604 OT Time Calculation (min): 20 min Charges:  OT General Charges $OT Visit: 1 Procedure OT Evaluation $Initial OT Evaluation Tier I: 1 Procedure G-CodesEarlie Raveling OTR/L [phone] 03/16/2015, 4:21 PM

## 2015-03-16 NOTE — Evaluation (Signed)
Physical Therapy Evaluation Patient Details Name: Tyler [name] MRN: [id number] DOB: 03-01-1934 Today's Date: 03/16/2015   History of Present Illness  81 y.o. male admitted with dyspnea . PMH is significant for CHF, SSS s/p pacemaker, a flutter/fib on warfarin, CKD, HTN, T2DM, HLD, BPH, Asthma, OSA, anemiaMedical and Cardiac workup in progress, triponins are negative, and per cardiologist, "there is no convincing evidence of volume overload or CHF".    Clinical Impression  Pt was able to get up OOB to chair with the Huntley Dec assisted standing frame.  He and his daughter report that he has been requiring more assistance for the last 2 weeks at the ALF (prior to that he was walking with RW).  Their preference is to return to [facility] if the ALF is agreeable to his higher level of care.  I would like for him to have PT after he is discharged from acute.  If he discharges to [facility], then there with them.   PT to follow acutely for deficits listed below.       Follow Up Recommendations Home health PT;Other (comment) (at ALF if ALF is willing to take him back)    Equipment Recommendations  None recommended by PT    Recommendations for Other Services   NA    Precautions / Restrictions Precautions Precautions: Fall Precaution Comments: pt with h/o falls and recent decline in his mobility.  Restrictions Weight Bearing Restrictions: No      Mobility  Bed Mobility Overal bed mobility: Needs Assistance Bed Mobility: Supine to Sit     Supine to sit: Mod assist;HOB elevated Sit to supine: Min assist   General bed mobility comments: Mod assist to support trunk to pull to sitting.  One arm pulling on therapist, the other arm on the bed rail.    Transfers Overall transfer level: Needs assistance   Transfers: Sit to/from Stand;Stand Pivot Transfers Sit to Stand: From elevated surface (from bed with Huntley Dec) Stand pivot transfers: From elevated surface (from bed with Huntley Dec)       General transfer comment: Used the Huntley Dec assisted stander to get pt OOB to chair and on his feet.  He did actively assist in the stand and his legs did not buckle while pivoting to the chair, he did demonstrate uncontrolled sit for the last 1/3 of the way down to low recliner chair.    Ambulation/Gait             General Gait Details: unable at this time due to weakness         Balance Overall balance assessment: Needs assistance Sitting-balance support: Feet supported;Bilateral upper extremity supported Sitting balance-Leahy Scale: Fair     Standing balance support: Bilateral upper extremity supported Standing balance-Leahy Scale: Zero                               Pertinent Vitals/Pain Pain Assessment: 0-10 Pain Score: 3  Pain Location: abdomen Pain Descriptors / Indicators: Sore Pain Intervention(s): Monitored during session    Home Living Family/patient expects to be discharged to:: Assisted living ([facility]) Living Arrangements: Alone Available Help at Discharge: Personal care attendant Type of Home: Apartment Home Access: Level entry     Home Layout: One level Home Equipment: Environmental consultant - 2 wheels;Walker - 4 wheels;Shower seat - built in;Grab bars - toilet;Grab bars - tub/shower;Adaptive equipment;Wheelchair - manual      Prior Function Level of Independence: Needs assistance  Gait / Transfers Assistance Needed: per daughter and pt he has needed much more assist to get up over the past two weeks.  Before that time he could walk with his RW.  He reports they help him up into his WC and he pushes backwards with his feet.   ADL's / Homemaking Assistance Needed: assist with meal prep, cleaning, and bathing/dressing as well as toilet hygiene (per OT evaluation)           Extremity/Trunk Assessment   Upper Extremity Assessment: RUE deficits/detail;LUE deficits/detail RUE Deficits / Details: right arm has had previous RTC repair per pt and is  lacking ~25% of full ROM.  Left arm is significantly more limited and is lacking ~50% motion.  He is able to move both arms against gravity within avaialable ROM with scapular compensations.      LUE Deficits / Details: right arm has had previous RTC repair per pt and is lacking ~25% of full ROM.  Left arm is significantly more limited and is lacking ~50% motion.  He is able to move both arms against gravity within avaialable ROM with scapular compensations.    Lower Extremity Assessment: Generalized weakness (3-/5 for most motions, also limited ROM especially knee ext)      Cervical / Trunk Assessment: Normal  Communication   Communication: HOH  Cognition Arousal/Alertness: Awake/alert Behavior During Therapy: Flat affect Overall Cognitive Status: Within Functional Limits for tasks assessed                         Exercises General Exercises - Upper Extremity Shoulder Flexion: AAROM;Both;10 reps;Seated (within available ROM) Elbow Flexion: AAROM;AROM;Both;10 reps;Seated Elbow Extension: AROM;AAROM;Both;10 reps;Seated General Exercises - Lower Extremity Long Arc Quad: AROM;Both;10 reps;Seated Hip ABduction/ADduction: AROM;Both;10 reps;Seated Hip Flexion/Marching: AROM;Both;10 reps;Seated Toe Raises: AROM;Both;10 reps;Seated      Assessment/Plan    PT Assessment Patient needs continued PT services  PT Diagnosis Difficulty walking;Abnormality of gait;Generalized weakness   PT Problem List Decreased strength;Decreased activity tolerance;Decreased balance;Decreased mobility;Decreased knowledge of use of DME;Obesity  PT Treatment Interventions DME instruction;Gait training;Functional mobility training;Therapeutic activities;Therapeutic exercise;Balance training;Neuromuscular re-education;Patient/family education   PT Goals (Current goals can be found in the Care Plan section) Acute Rehab PT Goals Patient Stated Goal: to go back to ALF PT Goal Formulation: With  patient/family Time For Goal Achievement: 03/30/15 Potential to Achieve Goals: Good    Frequency Min 3X/week    End of Session Equipment Utilized During Treatment: Gait belt;Other (comment) (sara lift) Activity Tolerance: Patient tolerated treatment well Patient left: in chair;with call bell/phone within reach;with family/visitor present;with chair alarm set Nurse Communication: Mobility status;Need for lift equipment         Time: 9562-1308 PT Time Calculation (min) (ACUTE ONLY): 30 min   Charges:   PT Evaluation $Initial PT Evaluation Tier I: 1 Procedure PT Treatments $Therapeutic Activity: 8-22 mins        Corby Villasenor B. Tyrone Pautsch, PT, DPT [phone]   03/16/2015, 5:03 PM

## 2015-03-17 ENCOUNTER — Inpatient Hospital Stay (HOSPITAL_COMMUNITY): Payer: Medicare Other

## 2015-03-17 DIAGNOSIS — I5033 Acute on chronic diastolic (congestive) heart failure: Principal | ICD-10-CM

## 2015-03-17 DIAGNOSIS — E1152 Type 2 diabetes mellitus with diabetic peripheral angiopathy with gangrene: Secondary | ICD-10-CM

## 2015-03-17 DIAGNOSIS — Z7901 Long term (current) use of anticoagulants: Secondary | ICD-10-CM

## 2015-03-17 LAB — PROTIME-INR
INR: 2.17 — ABNORMAL HIGH (ref 0.00–1.49)
Prothrombin Time: 24 seconds — ABNORMAL HIGH (ref 11.6–15.2)

## 2015-03-17 LAB — BASIC METABOLIC PANEL
Anion gap: 11 (ref 5–15)
BUN: 34 mg/dL — AB (ref 6–20)
CALCIUM: 8.3 mg/dL — AB (ref 8.9–10.3)
CO2: 24 mmol/L (ref 22–32)
Chloride: 99 mmol/L — ABNORMAL LOW (ref 101–111)
Creatinine, Ser: 2.21 mg/dL — ABNORMAL HIGH (ref 0.61–1.24)
GFR calc Af Amer: 31 mL/min — ABNORMAL LOW (ref >60)
GFR, EST NON AFRICAN AMERICAN: 27 mL/min — AB (ref >60)
GLUCOSE: 411 mg/dL — AB (ref 65–99)
POTASSIUM: 5 mmol/L (ref 3.5–5.1)
Sodium: 134 mmol/L — ABNORMAL LOW (ref 135–145)

## 2015-03-17 LAB — CBC
HEMATOCRIT: 31.7 % — AB (ref 39.0–52.0)
Hemoglobin: 10 g/dL — ABNORMAL LOW (ref 13.0–17.0)
MCH: 28.6 pg (ref 26.0–34.0)
MCHC: 31.5 g/dL (ref 30.0–36.0)
MCV: 90.6 fL (ref 78.0–100.0)
PLATELETS: 168 10*3/uL (ref 150–400)
RBC: 3.5 MIL/uL — ABNORMAL LOW (ref 4.22–5.81)
RDW: 13 % (ref 11.5–15.5)
WBC: 7.5 10*3/uL (ref 4.0–10.5)

## 2015-03-17 LAB — GLUCOSE, CAPILLARY
GLUCOSE-CAPILLARY: 262 mg/dL — AB (ref 65–99)
Glucose-Capillary: 396 mg/dL — ABNORMAL HIGH (ref 65–99)
Glucose-Capillary: 292 mg/dL — ABNORMAL HIGH (ref 65–99)

## 2015-03-17 MED ORDER — SENNOSIDES-DOCUSATE SODIUM 8.6-50 MG PO TABS
2.0000 | ORAL_TABLET | ORAL | Status: DC | PRN
  Administered 2015-03-17: 2 via ORAL
  Filled 2015-03-17: qty 2

## 2015-03-17 MED ORDER — CEPHALEXIN 250 MG PO CAPS: 250.0000 mg | ORAL_CAPSULE | Freq: Three times a day (TID) | ORAL | Status: DC

## 2015-03-17 MED ORDER — METOPROLOL TARTRATE 25 MG PO TABS
25.0000 mg | ORAL_TABLET | Freq: Two times a day (BID) | ORAL | Status: DC
  Administered 2015-03-17: 25 mg via ORAL
  Filled 2015-03-17: qty 1

## 2015-03-17 MED ORDER — INSULIN GLARGINE 100 UNIT/ML ~~LOC~~ SOLN
20.0000 [IU] | Freq: Two times a day (BID) | SUBCUTANEOUS | Status: DC
  Administered 2015-03-17: 20 [IU] via SUBCUTANEOUS
  Filled 2015-03-17 (×2): qty 0.2

## 2015-03-17 MED ORDER — CEPHALEXIN 250 MG PO CAPS
250.0000 mg | ORAL_CAPSULE | Freq: Three times a day (TID) | ORAL | Status: DC
  Administered 2015-03-17 (×2): 250 mg via ORAL
  Filled 2015-03-17 (×4): qty 1

## 2015-03-17 MED ORDER — NITROFURANTOIN MONOHYD MACRO 100 MG PO CAPS: 100.0000 mg | ORAL_CAPSULE | Freq: Two times a day (BID) | ORAL | Status: DC

## 2015-03-17 MED FILL — Perflutren Lipid Microsphere IV Susp 1.1 MG/ML: INTRAVENOUS | Qty: 10 | Status: AC

## 2015-03-17 NOTE — Progress Notes (Addendum)
12:30pm [facility] is able to accept pt back when stable for DC- MD confirms plan is to DC pt back to ALF today  9:30am CSW spoke with [facility] ([phone]) concerning pt return to facility when ready to DC from hospital- CSW faxed PT/OT clinicals they will review and make decision accordingly.  CSW will continue to follow.  Merlyn Lot, LCSWA Clinical Social Worker [phone]

## 2015-03-17 NOTE — Discharge Instructions (Signed)
Follow-up Information    Schedule an appointment as soon as possible for a visit with [name], MD.   Specialty:  Family Medicine   Contact information:   [address] [place] [address] [phone]        Cardiac Diet This diet can help prevent heart disease and stroke. Many factors influence your heart health, including eating and exercise habits. Coronary risk rises a lot with abnormal blood fat (lipid) levels. Cardiac meal planning includes limiting unhealthy fats, increasing healthy fats, and making other small dietary changes. General guidelines are as follows:  Adjust calorie intake to reach and maintain desirable body weight.  Limit total fat intake to less than 30% of total calories. Saturated fat should be less than 7% of calories.  Saturated fats are found in animal products and in some vegetable products. Saturated vegetable fats are found in coconut oil, cocoa butter, palm oil, and palm kernel oil. Read labels carefully to avoid these products as much as possible. Use butter in moderation. Choose tub margarines and oils that have 2 grams of fat or less. Good cooking oils are canola and olive oils.  Practice low-fat cooking techniques. Do not fry food. Instead, broil, bake, boil, steam, grill, roast on a rack, stir-fry, or microwave it. Other fat reducing suggestions include:  Remove the skin from poultry.  Remove all visible fat from meats.  Skim the fat off stews, soups, and gravies before serving them.  Steam vegetables in water or broth instead of sauting them in fat.  Avoid foods with trans fat (or hydrogenated oils), such as commercially fried foods and commercially baked goods. Commercial shortening and deep-frying fats will contain trans fat.  Increase intake of fruits, vegetables, whole grains, and legumes to replace foods high in fat.  Increase consumption of nuts, legumes, and seeds to at least 4 servings weekly. One serving of a legume  equals  cup, and 1 serving of nuts or seeds equals  cup.  Choose whole grains more often. Have 3 servings per day (a serving is 1 ounce [oz]).  Eat 4 to 5 servings of vegetables per day. A serving of vegetables is 1 cup of raw leafy vegetables;  cup of raw or cooked cut-up vegetables;  cup of vegetable juice.  Eat 4 to 5 servings of fruit per day. A serving of fruit is 1 medium whole fruit;  cup of dried fruit;  cup of fresh, frozen, or canned fruit;  cup of 100% fruit juice.  Increase your intake of dietary fiber to 20 to 30 grams per day. Insoluble fiber may help lower your risk of heart disease and may help curb your appetite.  Soluble fiber binds cholesterol to be removed from the blood. Foods high in soluble fiber are dried beans, citrus fruits, oats, apples, bananas, broccoli, Brussels sprouts, and eggplant.  Try to include foods fortified with plant sterols or stanols, such as yogurt, breads, juices, or margarines. Choose several fortified foods to achieve a daily intake of 2 to 3 grams of plant sterols or stanols.  Foods with omega-3 fats can help reduce your risk of heart disease. Aim to have a 3.5 oz portion of fatty fish twice per week, such as salmon, mackerel, albacore tuna, sardines, lake trout, or herring. If you wish to take a fish oil supplement, choose one that contains 1 gram of both DHA and EPA.  Limit processed meats to 2 servings (3 oz portion) weekly.  Limit the sodium in your diet to  1500 milligrams (mg) per day. If you have high blood pressure, talk to a registered dietitian about a DASH (Dietary Approaches to Stop Hypertension) eating plan.  Limit sweets and beverages with added sugar, such as soda, to no more than 5 servings per week. One serving is:   1 tablespoon sugar.  1 tablespoon jelly or jam.   cup sorbet.  1 cup lemonade.   cup regular soda. CHOOSING FOODS Starches  Allowed: Breads: All kinds (wheat, rye, raisin, white, oatmeal, [place],  [place], and English muffin bread). Low-fat rolls: English muffins, frankfurter and hamburger buns, bagels, pita bread, tortillas (not fried). Pancakes, waffles, biscuits, and muffins made with recommended oil.  Avoid: Products made with saturated or trans fats, oils, or whole milk products. Butter rolls, cheese breads, croissants. Commercial doughnuts, muffins, sweet rolls, biscuits, waffles, pancakes, store-bought mixes. Crackers  Allowed: Low-fat crackers and snacks: Animal, graham, rye, saltine (with recommended oil, no lard), oyster, and matzo crackers. Bread sticks, melba toast, rusks, flatbread, pretzels, and light popcorn.  Avoid: High-fat crackers: cheese crackers, butter crackers, and those made with coconut, palm oil, or trans fat (hydrogenated oils). Buttered popcorn. Cereals  Allowed: Hot or cold whole-grain cereals.  Avoid: Cereals containing coconut, hydrogenated vegetable fat, or animal fat. Potatoes / Pasta / Rice  Allowed: All kinds of potatoes, rice, and pasta (such as macaroni, spaghetti, and noodles).  Avoid: Pasta or rice prepared with cream sauce or high-fat cheese. Chow mein noodles, [place] fries. Vegetables  Allowed: All vegetables and vegetable juices.  Avoid: Fried vegetables. Vegetables in cream, butter, or high-fat cheese sauces. Limit coconut. Fruit in cream or custard. Protein  Allowed: Limit your intake of meat, seafood, and poultry to no more than 6 oz (cooked weight) per day. All lean, well-trimmed beef, veal, pork, and lamb. All chicken and [place] without skin. All fish and shellfish. Wild game: wild duck, rabbit, pheasant, and venison. Egg whites or low-cholesterol egg substitutes may be used as desired. Meatless dishes: recipes with dried beans, peas, lentils, and tofu (soybean curd). Seeds and nuts: all seeds and most nuts.  Avoid: Prime grade and other heavily marbled and fatty meats, such as short ribs, spare ribs, rib eye roast or steak,  frankfurters, sausage, bacon, and high-fat luncheon meats, mutton. Caviar. Commercially fried fish. Domestic duck, goose, venison sausage. Organ meats: liver, gizzard, heart, chitterlings, brains, kidney, sweetbreads. Dairy  Allowed: Low-fat cheeses: nonfat or low-fat cottage cheese (1% or 2% fat), cheeses made with part skim milk, such as mozzarella, farmers, string, or ricotta. (Cheeses should be labeled no more than 2 to 6 grams fat per oz.). Skim (or 1%) milk: liquid, powdered, or evaporated. Buttermilk made with low-fat milk. Drinks made with skim or low-fat milk or cocoa. Chocolate milk or cocoa made with skim or low-fat (1%) milk. Nonfat or low-fat yogurt.  Avoid: Whole milk cheeses, including colby, cheddar, [place], [address], [place], [place], [place], [address], [place], and blue. Creamed cottage cheese, cream cheese. Whole milk and whole milk products, including buttermilk or yogurt made from whole milk, drinks made from whole milk. Condensed milk, evaporated whole milk, and 2% milk. Soups and Combination Foods  Allowed: Low-fat low-sodium soups: broth, dehydrated soups, homemade broth, soups with the fat removed, homemade cream soups made with skim or low-fat milk. Low-fat spaghetti, lasagna, chili, and Spanish rice if low-fat ingredients and low-fat cooking techniques are used.  Avoid: Cream soups made with whole milk, cream, or high-fat cheese. All other soups. Desserts and Sweets  Allowed: Sherbet, fruit  ices, gelatins, meringues, and angel food cake. Homemade desserts with recommended fats, oils, and milk products. Jam, jelly, honey, marmalade, sugars, and syrups. Pure sugar candy, such as gum drops, hard candy, jelly beans, marshmallows, mints, and small amounts of dark chocolate.  Avoid: Commercially prepared cakes, pies, cookies, frosting, pudding, or mixes for these products. Desserts containing whole milk products, chocolate, coconut, lard, palm oil, or palm kernel oil. Ice  cream or ice cream drinks. Candy that contains chocolate, coconut, butter, hydrogenated fat, or unknown ingredients. Buttered syrups. Fats and Oils  Allowed: Vegetable oils: safflower, sunflower, corn, soybean, cottonseed, sesame, canola, olive, or peanut. Non-hydrogenated margarines. Salad dressing or mayonnaise: homemade or commercial, made with a recommended oil. Low or nonfat salad dressing or mayonnaise.  Limit added fats and oils to 6 to 8 tsp per day (includes fats used in cooking, baking, salads, and spreads on bread). Remember to count the "hidden fats" in foods.  Avoid: Solid fats and shortenings: butter, lard, salt pork, bacon drippings. Gravy containing meat fat, shortening, or suet. Cocoa butter, coconut. Coconut oil, palm oil, palm kernel oil, or hydrogenated oils: these ingredients are often used in bakery products, nondairy creamers, whipped toppings, candy, and commercially fried foods. Read labels carefully. Salad dressings made of unknown oils, sour cream, or cheese, such as blue cheese and [place]. Cream, all kinds: half-and-half, light, heavy, or whipping. Sour cream or cream cheese (even if "light" or low-fat). Nondairy cream substitutes: coffee creamers and sour cream substitutes made with palm, palm kernel, hydrogenated oils, or coconut oil. Beverages  Allowed: Coffee (regular or decaffeinated), tea. Diet carbonated beverages, mineral water. Alcohol: Check with your caregiver. Moderation is recommended.  Avoid: Whole milk, regular sodas, and juice drinks with added sugar. Condiments  Allowed: All seasonings and condiments. Cocoa powder. "Cream" sauces made with recommended ingredients.  Avoid: Carob powder made with hydrogenated fats. SAMPLE MENU Breakfast   cup orange juice   cup oatmeal  1 slice toast  1 tsp margarine  1 cup skim milk Lunch  [place] sandwich with 2 oz [place], 2 slices bread  Lettuce and tomato slices  Fresh fruit  Carrot  sticks  Coffee or tea Snack  Fresh fruit or low-fat crackers Dinner  3 oz lean ground beef  1 baked potato  1 tsp margarine   cup asparagus  Lettuce salad  1 tbs non-creamy dressing   cup peach slices  1 cup skim milk Document Released: 04/18/2008 Document Revised: 01/09/2012 Document Reviewed: 09/09/2013 ExitCare Patient Information 2015 [place], [place]. This information is not intended to replace advice given to you by your health care provider. Make sure you discuss any questions you have with your health care provider.

## 2015-03-17 NOTE — Clinical Social Work Note (Signed)
Clinical Social Work Assessment  Patient Details  Name: Tyler [name] MRN: [id number] Date of Birth: 02/24/1934  Date of referral:  03/17/15               Reason for consult:  Facility Placement                Permission sought to share information with:  Facility Medical sales representative, Family Supports Permission granted to share information::     Name::     Tyler [name]  Agency::  [facility]  Relationship::  daughter  Solicitor Information:     Housing/Transportation Living arrangements for the past 2 months:  Assisted Living Facility ([facility]) Source of Information:  Adult Children Patient Interpreter Needed:  None Criminal Activity/Legal Involvement Pertinent to Current Situation/Hospitalization:  No - Comment as needed Significant Relationships:  Adult Children Lives with:  Facility Resident Do you feel safe going back to the place where you live?  Yes Need for family participation in patient care:  Yes (Comment) (decision making/ arrangements)  Care giving concerns:  Pt lives at [facility]- pt required fair amount of assistance but ALF states they are able to accept pt back at time of DC given current needs   Office manager / plan:  CSW spoke with pt daughter concerning plan for return to ALF  Employment status:  Retired Health and safety inspector:  Medicare PT Recommendations:  Home with Home Health Information / Referral to community resources:     Patient/Family's Response to care: Pt family is agreeable to pt return to ALF when stable for DC  Patient/Family's Understanding of and Emotional Response to Diagnosis, Current Treatment, and Prognosis:  Pt daughter has been extremely displeased with patient care at [facility]- stated that she has asked several nurses to have an MD call her and that no physician has called them to explain what was being done for the patient.  Daughter does not feel comfortable having pt go back to [facility] without  having any communication with a doctor.  CSW informed MD of pt daughter concerns- MD to follow up with pt daughter.  Emotional Assessment Appearance:  Appears stated age Attitude/Demeanor/Rapport:  Unable to Assess Affect (typically observed):  Unable to Assess Orientation:  Oriented to Self, Oriented to Place, Oriented to  Time Alcohol / Substance use:  Not Applicable Psych involvement (Current and /or in the community):  No (Comment)  Discharge Needs  Concerns to be addressed:  Care Coordination Readmission within the last 30 days:  No Current discharge risk:  Physical Impairment Barriers to Discharge:  Continued Medical Work up   [employer], LCSW 03/17/2015, 1:44 PM

## 2015-03-17 NOTE — Progress Notes (Signed)
Family Medicine Teaching Service Daily Progress Note Intern Pager: [phone]  Patient name: Tyler [name] Medical record number: [id number] Date of birth: 28-Apr-1934 Age: 81 y.o. Gender: male  Primary Care Provider: [name], MD Consultants: Cards Code Status: Full  Pt Overview and Major Events to Date:  8/22: Admit for SOB, abdominal pain  Assessment and Plan:  Gamble L Javid is a 81 y.o. male presenting with dyspnea . PMH is significant for CHF, SSS s/p pacemaker, a flutter/fib on warfarin, CKD, HTN, T2DM, HLD, BPH, Asthma, OSA, anemia  # SOB/CP: concern for worsening HF, less likely pneumonia - telemetry - S/p lasix 60mg  IV, with good UOP response -  troponins neg - daily weights - strict I&Os - s/p echo - Cards c/s--> appreciate recs  # Hyperkalemia: Resolved -follow BMPs  # CKD: Cr 2.05 on admission, prior value 1.9 in 06/2013 and 1.8 02/2013, likely baseline - follow Cr  # UTI: was on treatment with macrobid as outpatient for this starting last week. UA with large leukocytes, microscopy TNTC WBC and few bacteria. S/p ceftriaxone in ED. - urine cx pending - s/p 2 days rosephin transition to keflex today, to continue for 7 day course ( D6) abx  # T2DM: on lantus 22u in AM and 40u qHS  - CBG monitoring qAC qHS - SSI - startig Lantus 20 BID  # HTN: stable  - continue home metoprolol  # BPH:  - on both flomax and cardura. Will continue both for now, consider stopping one as outpatient - d/c amitriptyline as can potentiate urinary retention - continue myrbetriq  # Afib/flutter on warfarin - continue warfarin per pharm  FEN/GI: diet HH/carb mod / saline lock Prophylaxis: heparin sq  Disposition: Pending PT/OT recs likely to ALF  Subjective:  Feels improved breathing, chest pain and abd pain  Objective: Temp:  [98 F (36.7 C)-98.2 F (36.8 C)] 98.1 F (36.7 C) (08/24 0543) Pulse Rate:  [60-61] 60 (08/24 0543) Resp:  [18-20] 18 (08/24  0543) BP: (134-172)/(43-53) 153/43 mmHg (08/24 0543) SpO2:  [94 %-98 %] 94 % (08/24 0543) Weight:  [279 lb 1.6 oz (126.599 kg)] 279 lb 1.6 oz (126.599 kg) (08/24 0543) Physical Exam: General: NAD, sitting up in bed eating breakfast Cardiovascular: RRR, no murmurs Respiratory: Clear lung sounds, exam limited by pt's cecreased mobility normal WOB at rest, but increased WOB after mobilization Abdomen: distended, taught, no fluid wave, + BS, non tender Extremities: hairless bilateral LE extremities, few ulcers, left great toes gangrenous ulcer not draining  Laboratory:  Recent Labs Lab 03/15/15 1800 03/17/15 0441  WBC 8.2 7.5  HGB 10.4* 10.0*  HCT 31.9* 31.7*  PLT 177 168    Recent Labs Lab 03/15/15 1800 03/15/15 1942 03/16/15 0458 03/17/15 0441  NA 142  --  138 134*  K 5.7*  --  4.4 5.0  CL 106  --  102 99*  CO2 27  --  25 24  BUN 32*  --  30* 34*  CREATININE 2.05*  --  2.00* 2.21*  CALCIUM 8.6*  --  8.5* 8.3*  PROT  --  6.5  --   --   BILITOT  --  1.8*  --   --   ALKPHOS  --  93  --   --   ALT  --  20  --   --   AST  --  65*  --   --   GLUCOSE 48*  --  249* 411*  Imaging/Diagnostic Tests:   CXR 8/22  IMPRESSION: Enlargement of cardiac silhouette with pulmonary vascular congestion.  Small bibasilar effusions.  Bonney Aid, MD 03/17/2015, 8:13 AM PGY-2, [facility] FPTS Intern pager: [phone], text pages welcome

## 2015-03-17 NOTE — Progress Notes (Signed)
ANTICOAGULATION CONSULT NOTE  Pharmacy Consult for Coumadin Indication: atrial fibrillation  Allergies  Allergen Reactions  . Neosporin Original [Bacitracin-Neomycin-Polymyxin]     Rinaldo Cloud McWhite from [facility] [phone] states pt is allergic to Neosporin and Polysporin ointments.    Patient Measurements: Height:  (177.8 cm) Weight: 279 lb 1.6 oz (126.599 kg) IBW/kg (Calculated) : 73  Vital Signs: Temp: 98.3 F (36.8 C) (08/24 1033) Temp Source: Oral (08/24 1033) BP: 153/54 mmHg (08/24 1033) Pulse Rate: 60 (08/24 1033)  Labs:  Recent Labs  03/15/15 1800 03/16/15 0045 03/16/15 0458 03/16/15 1021 03/17/15 0441  HGB 10.4*  --   --   --  10.0*  HCT 31.9*  --   --   --  31.7*  PLT 177  --   --   --  168  LABPROT 25.6*  --  26.5*  --  24.0*  INR 2.37*  --  2.48*  --  2.17*  CREATININE 2.05*  --  2.00*  --  2.21*  TROPONINI  --  <0.03 <0.03 <0.03  --     Estimated Creatinine Clearance: 36.2 mL/min (by C-G formula based on Cr of 2.21).    Assessment: 81 y.o. male admitted with SOB/chest pain, h/o Afib, to continue Coumadin pta 7.5 mg daily at [facility] for Afib. Pharmacy to dose inpatient. INR therapeutic,  2.37>>2.48>2.17, dose missed 8/22, Hg stable at 10, plt wnl. No bleeding reported.  Expect INR will drop due to missed dose 8/22.   Goal of Therapy:  INR 2-3  Plan:  continue home dose - Warfarin 7.5mg  daily for now Daily INR for now Mon s/sx bleeding  Herby Abraham, Pharm.D. [phone] 03/17/2015 11:08 AM

## 2015-03-17 NOTE — Progress Notes (Signed)
Patient has been discharged with family; IV therapy site has been removed; no s/s of distress at time of discharge.

## 2015-03-17 NOTE — Plan of Care (Signed)
Problem: Phase I Progression Outcomes Goal: OOB as tolerated unless otherwise ordered Outcome: Progressing Requires sara lift

## 2015-03-17 NOTE — Progress Notes (Signed)
03/17/15  1919  Called [facility] gave report to [employer].

## 2015-03-17 NOTE — Progress Notes (Signed)
Pt ALF is able to admit patient if there are no new medications added to the patients medication list.  CSW provided hand off to evening social worker who will follow up if patient is able to DC.  Merlyn Lot, [facility] Clinical Social Worker [phone]

## 2015-03-17 NOTE — Progress Notes (Signed)
Patient Name: Tyler [name] Date of Encounter: 03/17/2015  Principal Problem:   Shortness of breath Active Problems:   Diabetes mellitus, type II   Hyperlipidemia   Essential hypertension   CKD (chronic kidney disease) stage 3, GFR 30-59 ml/min   Warfarin anticoagulation   CHF (congestive heart failure)   SOB (shortness of breath)   Length of Stay: 2  SUBJECTIVE  Excellent UO and 3.6L net negative balance. Despite this, little improvement in breathing. Severely distended, tense abdomen. Echo showed normal LVEF and "grade 2 diastolic dysfunction /high filling pressures" (the Doppler velocities are actually borderline indicative of high filling pressures.  CURRENT MEDS . cephALEXin  250 mg Oral 3 times per day  . doxazosin  2 mg Oral QHS  . gabapentin  100 mg Oral TID  . insulin aspart  0-15 Units Subcutaneous TID WC  . insulin aspart  0-5 Units Subcutaneous QHS  . insulin glargine  20 Units Subcutaneous BID  . metoCLOPramide  5 mg Oral QHS  . metoprolol tartrate  25 mg Oral BID  . mirabegron ER  50 mg Oral Daily  . mometasone-formoterol  2 puff Inhalation BID  . montelukast  10 mg Oral QHS  . mupirocin cream   Topical Daily  . olopatadine  1 drop Both Eyes BID  . pantoprazole  40 mg Oral Daily  . sertraline  25 mg Oral Daily  . simvastatin  20 mg Oral QHS  . sodium chloride  3 mL Intravenous Q12H  . sodium chloride  3 mL Intravenous Q12H  . tamsulosin  0.4 mg Oral Daily  . warfarin  7.5 mg Oral q1800  . Warfarin - Pharmacist Dosing Inpatient   Does not apply q1800    OBJECTIVE   Intake/Output Summary (Last 24 hours) at 03/17/15 1139 Last data filed at 03/17/15 1000  Gross per 24 hour  Intake    530 ml  Output   2900 ml  Net  -2370 ml   Filed Weights   03/15/15 2208 03/16/15 0540 03/17/15 0543  Weight: 130.409 kg (287 lb 8 oz) 130.409 kg (287 lb 8 oz) 126.599 kg (279 lb 1.6 oz)    PHYSICAL EXAM Filed Vitals:   03/16/15 1950 03/17/15 0543 03/17/15  1033 03/17/15 1036  BP: 166/53 153/43 153/54   Pulse: 61 60 60   Temp: 98 F (36.7 C) 98.1 F (36.7 C) 98.3 F (36.8 C)   TempSrc: Oral Oral Oral   Resp: 18 18    Height:      Weight:  126.599 kg (279 lb 1.6 oz)    SpO2: 98% 94% 97% 94%   General: Alert, oriented x3, no distress Head: no evidence of trauma, PERRL, EOMI, no exophtalmos or lid lag, no myxedema, no xanthelasma; normal ears, nose and oropharynx Neck: normal jugular venous pulsations and no hepatojugular reflux; brisk carotid pulses without delay and no carotid bruits Chest: clear to auscultation, no signs of consolidation by percussion or palpation, normal fremitus, symmetrical and full respiratory excursions Cardiovascular: normal position and quality of the apical impulse, regular rhythm, normal first and widely split second heart sounds, no rubs or gallops, no murmur Abdomen: tense, marked distention, mildly hypoactive bowel sounds Extremities: no clubbing, cyanosis or edema; dressing right pretibial; 2+ radial, ulnar and brachial pulses bilaterally; unable to palpate femoral, popliteal or posterior tibial pulses on either side; 2+ left dorsalis pedis, cannot palpate right dorsalis pedis; no subclavian or femoral bruits Neurological: grossly nonfocal  LABS  CBC  Recent  Labs  03/15/15 1800 03/17/15 0441  WBC 8.2 7.5  HGB 10.4* 10.0*  HCT 31.9* 31.7*  MCV 90.4 90.6  PLT 177 168   Basic Metabolic Panel  Recent Labs  03/16/15 0458 03/17/15 0441  NA 138 134*  K 4.4 5.0  CL 102 99*  CO2 25 24  GLUCOSE 249* 411*  BUN 30* 34*  CREATININE 2.00* 2.21*  CALCIUM 8.5* 8.3*   Liver Function Tests  Recent Labs  03/15/15 1942  AST 65*  ALT 20  ALKPHOS 93  BILITOT 1.8*  PROT 6.5  ALBUMIN 3.2*   No results for input(s): LIPASE, AMYLASE in the last 72 hours. Cardiac Enzymes  Recent Labs  03/16/15 0045 03/16/15 0458 03/16/15 1021  TROPONINI <0.03 <0.03 <0.03    Radiology Studies Imaging results  have been reviewed and Dg Chest 2 View  03/15/2015   CLINICAL DATA:  Shortness of breath today, history hypertension, type II diabetes mellitus, hyperlipidemia, asthma, former smoker  EXAM: CHEST  2 VIEW  COMPARISON:  06/08/2012  FINDINGS: LEFT subclavian transvenous pacemaker leads project at RIGHT atrium and RIGHT ventricle, appearing unchanged from previous exam.  Enlargement of cardiac silhouette with pulmonary vascular congestion.  Persistent small pleural effusions.  Chronic elevation of RIGHT diaphragm.  No definite pulmonary edema, segmental consolidation or pneumothorax.  Bones unremarkable.  IMPRESSION: Enlargement of cardiac silhouette with pulmonary vascular congestion.  Small bibasilar effusions.   Electronically Signed   By: Ulyses Southward M.D.   On: 03/15/2015 17:44    TELE A paced, V sensed  ECG A paced, RBBB  ASSESSMENT AND PLAN  Abdominal distention may be principal cause of dyspnea. Etiology is unclear. Ascites?  Acute on chronic diastolic dysfunction - minimally abnormal BNP, minimal signs of increased filling pressures by echo Doppler, now with worsening renal function. Would not diurese any further.  Acute on chronic renal insufficiency/diabetic-hypertensive nephropathy  Paroxysmal atrial flutter/atrial fibrillation Continue anticoagulation CHADS2VASC of 4  Dual chamber pacemaker with normal function ([employer]. [employer])  Severe PAD with left toe ulceration  Essential HTN - borderline control; note low diastolic BP, likely very noncompliant arterial system   Thurmon Fair, MD, [facility] [phone] office [phone] pager 03/17/2015 11:39 AM

## 2015-03-17 NOTE — Progress Notes (Signed)
Pt's paperwork prepared and sent to [facility] this pm.  Facility agreeable to accepting pt back.  Daughter to transport.

## 2015-03-18 ENCOUNTER — Ambulatory Visit: Payer: Medicare Other | Admitting: Podiatry

## 2015-03-18 NOTE — Care Management Important Message (Signed)
Important Message  Patient Details  Name: Tyler [name] MRN: [id number] Date of Birth: [birth year]   Medicare Important Message Given:  N/A - LOS <3 / Initial given by admissions    Darrold Span, RN 03/18/2015, 2:41 PM

## 2015-03-19 ENCOUNTER — Telehealth: Payer: Self-pay | Admitting: Family Medicine

## 2015-03-19 LAB — URINE CULTURE: Culture: >=100000

## 2015-03-19 NOTE — Telephone Encounter (Signed)
Paged by pharmacist regarding Urine culture.  UCx showed Klebsiella UTI resistant to macrobid, but sensitive to ceftriaxone and Keflex.  Finished 6.5 days of appropriate antibiotics prior to discharged.  Was discharged with macrobid, but completed appropriate course prior to discharge.  No need to call patient and switch abx.  Erasmo Downer, MD, MPH PGY-2,  [facility] 03/19/2015 2:55 PM

## 2015-03-19 NOTE — Progress Notes (Signed)
Urine culture from 8/22 returned resistant to Nitrofurantoin his discharge antibiotics  Given a uti in a male will treat with 7 more days of Keflex 250 mg twice daily  [facility]oke with [facility]  faxed rx to [facility] [phone] and [facility] [phone]  Alaija Ruble L

## 2015-03-22 ENCOUNTER — Ambulatory Visit (INDEPENDENT_AMBULATORY_CARE_PROVIDER_SITE_OTHER): Payer: Medicare Other | Admitting: Podiatry

## 2015-03-22 DIAGNOSIS — E1140 Type 2 diabetes mellitus with diabetic neuropathy, unspecified: Secondary | ICD-10-CM

## 2015-03-22 DIAGNOSIS — L97521 Non-pressure chronic ulcer of other part of left foot limited to breakdown of skin: Secondary | ICD-10-CM

## 2015-03-22 DIAGNOSIS — M2021 Hallux rigidus, right foot: Secondary | ICD-10-CM

## 2015-03-22 DIAGNOSIS — L84 Corns and callosities: Secondary | ICD-10-CM | POA: Diagnosis not present

## 2015-03-22 DIAGNOSIS — M2022 Hallux rigidus, left foot: Secondary | ICD-10-CM

## 2015-03-22 DIAGNOSIS — B351 Tinea unguium: Secondary | ICD-10-CM

## 2015-03-22 NOTE — Progress Notes (Signed)
Patient ID: Tyler [name], male   DOB: Feb 12, 1934, 81 y.o.   MRN: [id number]  Subjective: 81 year old male presents the office today follow-up evaluation of left hallux ulceration. He states his continue to wear the surgical shoe. He does stated they are changing the bandage daily. He denies any redness or any drainage coming from the wound. He also states that he has been taking the pressure off his left heel with pillows when laid in bed. He also is requesting nail debridement today as his nails causing irritation inside shoes. Denies any redness or drainage on the nail sites. He also presents for diabetic shoes. No other complaints at this time in no acute changes. Denies any systemic complaints as fevers, chills, nausea, vomiting.  Objective: AAO 3, NAD DP/PT pulses palpable, CRT less than 3 seconds  Protective sensation decreased with Simms Weinstein monofilament At the distal aspect of the left hallux there is a small superficial ulceration with hyperkeratotic periwound. After debridement the wound measures possible 0.2 x 0.2 cm. The wound is superficial. There is no probing to bone, undermining, tunneling. There is no surrounding erythema, ascending cellulitis, fluctuance, crepitus, malodor, drainage/purulence. There is no erythema to the left heel at this time. There is no open lesion at this time bilaterally.  Nails hypertrophic, dystrophic, brittle, discolored, elongated 10. There is tenderness overlying nails 1-5 bilaterally. There is no surrounding erythema or drainage. There is no pain with calf compression, swelling, warmth, erythema.  Assessment: Left hallux ulceration, left heel pre-ulcerative lesion; symptomatic mycosis.   Plan: -Treatment options discussed including all alternatives, risks, and complications -Left hallux ulceration was sharply debrided without complications to healthy, bleeding, granular tissue. Iodosorb was applied followed by dry sterile dressing. Continue  daily dressing changes. Continue with surgical shoe. Monitor for any clinical signs or symptoms of infection and directed to call the office immediately should any occur or go to the ER. -Continue to monitor left heel daily. Continue to float the heels on pillows while in bed. -nails sharply debrided 10 without complication/cleaning. -Diabetic shoes were dispensed. Break in instructions were discussed with him. If there are any problems  to hold off on wearing them a call the office immediately. -Follow-up 3 weeks or sooner if any problems arise to recheck left foot. In the meantime, encouraged to call the office with any questions, concerns, change in symptoms.   Ovid Curd, DPM

## 2015-03-30 ENCOUNTER — Encounter (HOSPITAL_BASED_OUTPATIENT_CLINIC_OR_DEPARTMENT_OTHER): Payer: Medicare Other | Attending: General Surgery

## 2015-03-30 DIAGNOSIS — M1990 Unspecified osteoarthritis, unspecified site: Secondary | ICD-10-CM | POA: Insufficient documentation

## 2015-03-30 DIAGNOSIS — K219 Gastro-esophageal reflux disease without esophagitis: Secondary | ICD-10-CM | POA: Insufficient documentation

## 2015-03-30 DIAGNOSIS — L97811 Non-pressure chronic ulcer of other part of right lower leg limited to breakdown of skin: Secondary | ICD-10-CM | POA: Insufficient documentation

## 2015-03-30 DIAGNOSIS — Z794 Long term (current) use of insulin: Secondary | ICD-10-CM | POA: Insufficient documentation

## 2015-03-30 DIAGNOSIS — I509 Heart failure, unspecified: Secondary | ICD-10-CM | POA: Diagnosis not present

## 2015-03-30 DIAGNOSIS — Z9841 Cataract extraction status, right eye: Secondary | ICD-10-CM | POA: Diagnosis not present

## 2015-03-30 DIAGNOSIS — Z9842 Cataract extraction status, left eye: Secondary | ICD-10-CM | POA: Diagnosis not present

## 2015-03-30 DIAGNOSIS — L97521 Non-pressure chronic ulcer of other part of left foot limited to breakdown of skin: Secondary | ICD-10-CM | POA: Insufficient documentation

## 2015-03-30 DIAGNOSIS — Z950 Presence of cardiac pacemaker: Secondary | ICD-10-CM | POA: Diagnosis not present

## 2015-03-30 DIAGNOSIS — E1140 Type 2 diabetes mellitus with diabetic neuropathy, unspecified: Secondary | ICD-10-CM | POA: Insufficient documentation

## 2015-03-30 DIAGNOSIS — E11622 Type 2 diabetes mellitus with other skin ulcer: Secondary | ICD-10-CM | POA: Insufficient documentation

## 2015-03-30 DIAGNOSIS — I10 Essential (primary) hypertension: Secondary | ICD-10-CM | POA: Insufficient documentation

## 2015-03-30 DIAGNOSIS — W19XXXA Unspecified fall, initial encounter: Secondary | ICD-10-CM | POA: Insufficient documentation

## 2015-03-30 DIAGNOSIS — I484 Atypical atrial flutter: Secondary | ICD-10-CM | POA: Insufficient documentation

## 2015-03-30 DIAGNOSIS — Z87891 Personal history of nicotine dependence: Secondary | ICD-10-CM | POA: Insufficient documentation

## 2015-03-30 DIAGNOSIS — J45909 Unspecified asthma, uncomplicated: Secondary | ICD-10-CM | POA: Diagnosis not present

## 2015-03-30 DIAGNOSIS — G4730 Sleep apnea, unspecified: Secondary | ICD-10-CM | POA: Insufficient documentation

## 2015-03-30 DIAGNOSIS — E11621 Type 2 diabetes mellitus with foot ulcer: Secondary | ICD-10-CM | POA: Diagnosis not present

## 2015-03-30 LAB — GLUCOSE, CAPILLARY: GLUCOSE-CAPILLARY: 123 mg/dL — AB (ref 65–99)

## 2015-04-05 ENCOUNTER — Inpatient Hospital Stay (HOSPITAL_COMMUNITY)
Admission: EM | Admit: 2015-04-05 | Discharge: 2015-04-21 | DRG: 291 | Disposition: A | Discharge status: Skilled Nursing Facility | Payer: Medicare Other | Attending: Family Medicine | Admitting: Family Medicine

## 2015-04-05 ENCOUNTER — Emergency Department (HOSPITAL_COMMUNITY): Payer: Medicare Other

## 2015-04-05 ENCOUNTER — Encounter (HOSPITAL_COMMUNITY): Payer: Self-pay | Admitting: *Deleted

## 2015-04-05 DIAGNOSIS — J309 Allergic rhinitis, unspecified: Secondary | ICD-10-CM | POA: Diagnosis present

## 2015-04-05 DIAGNOSIS — I130 Hypertensive heart and chronic kidney disease with heart failure and stage 1 through stage 4 chronic kidney disease, or unspecified chronic kidney disease: Principal | ICD-10-CM | POA: Diagnosis present

## 2015-04-05 DIAGNOSIS — Z7901 Long term (current) use of anticoagulants: Secondary | ICD-10-CM

## 2015-04-05 DIAGNOSIS — E875 Hyperkalemia: Secondary | ICD-10-CM | POA: Diagnosis present

## 2015-04-05 DIAGNOSIS — K5901 Slow transit constipation: Secondary | ICD-10-CM | POA: Insufficient documentation

## 2015-04-05 DIAGNOSIS — N183 Chronic kidney disease, stage 3 unspecified: Secondary | ICD-10-CM | POA: Diagnosis present

## 2015-04-05 DIAGNOSIS — N138 Other obstructive and reflux uropathy: Secondary | ICD-10-CM | POA: Diagnosis present

## 2015-04-05 DIAGNOSIS — N189 Chronic kidney disease, unspecified: Secondary | ICD-10-CM | POA: Insufficient documentation

## 2015-04-05 DIAGNOSIS — K560 Paralytic ileus: Secondary | ICD-10-CM | POA: Diagnosis present

## 2015-04-05 DIAGNOSIS — K3184 Gastroparesis: Secondary | ICD-10-CM | POA: Diagnosis present

## 2015-04-05 DIAGNOSIS — E662 Morbid (severe) obesity with alveolar hypoventilation: Secondary | ICD-10-CM | POA: Diagnosis present

## 2015-04-05 DIAGNOSIS — R21 Rash and other nonspecific skin eruption: Secondary | ICD-10-CM | POA: Diagnosis present

## 2015-04-05 DIAGNOSIS — T40605A Adverse effect of unspecified narcotics, initial encounter: Secondary | ICD-10-CM | POA: Diagnosis present

## 2015-04-05 DIAGNOSIS — I5033 Acute on chronic diastolic (congestive) heart failure: Secondary | ICD-10-CM

## 2015-04-05 DIAGNOSIS — Z91048 Other nonmedicinal substance allergy status: Secondary | ICD-10-CM

## 2015-04-05 DIAGNOSIS — R140 Abdominal distension (gaseous): Secondary | ICD-10-CM | POA: Insufficient documentation

## 2015-04-05 DIAGNOSIS — E1152 Type 2 diabetes mellitus with diabetic peripheral angiopathy with gangrene: Secondary | ICD-10-CM | POA: Diagnosis not present

## 2015-04-05 DIAGNOSIS — E1122 Type 2 diabetes mellitus with diabetic chronic kidney disease: Secondary | ICD-10-CM | POA: Diagnosis present

## 2015-04-05 DIAGNOSIS — E119 Type 2 diabetes mellitus without complications: Secondary | ICD-10-CM

## 2015-04-05 DIAGNOSIS — N179 Acute kidney failure, unspecified: Secondary | ICD-10-CM | POA: Diagnosis present

## 2015-04-05 DIAGNOSIS — J45909 Unspecified asthma, uncomplicated: Secondary | ICD-10-CM | POA: Diagnosis present

## 2015-04-05 DIAGNOSIS — E871 Hypo-osmolality and hyponatremia: Secondary | ICD-10-CM | POA: Diagnosis present

## 2015-04-05 DIAGNOSIS — I480 Paroxysmal atrial fibrillation: Secondary | ICD-10-CM | POA: Diagnosis present

## 2015-04-05 DIAGNOSIS — E785 Hyperlipidemia, unspecified: Secondary | ICD-10-CM | POA: Diagnosis present

## 2015-04-05 DIAGNOSIS — Z87891 Personal history of nicotine dependence: Secondary | ICD-10-CM | POA: Diagnosis not present

## 2015-04-05 DIAGNOSIS — Z9119 Patient's noncompliance with other medical treatment and regimen: Secondary | ICD-10-CM | POA: Diagnosis present

## 2015-04-05 DIAGNOSIS — K593 Megacolon, not elsewhere classified: Secondary | ICD-10-CM | POA: Diagnosis present

## 2015-04-05 DIAGNOSIS — Z794 Long term (current) use of insulin: Secondary | ICD-10-CM | POA: Diagnosis not present

## 2015-04-05 DIAGNOSIS — K5669 Other intestinal obstruction: Secondary | ICD-10-CM | POA: Diagnosis not present

## 2015-04-05 DIAGNOSIS — I5031 Acute diastolic (congestive) heart failure: Secondary | ICD-10-CM | POA: Diagnosis not present

## 2015-04-05 DIAGNOSIS — K219 Gastro-esophageal reflux disease without esophagitis: Secondary | ICD-10-CM | POA: Diagnosis present

## 2015-04-05 DIAGNOSIS — Z79899 Other long term (current) drug therapy: Secondary | ICD-10-CM

## 2015-04-05 DIAGNOSIS — Z993 Dependence on wheelchair: Secondary | ICD-10-CM | POA: Diagnosis not present

## 2015-04-05 DIAGNOSIS — K599 Functional intestinal disorder, unspecified: Secondary | ICD-10-CM | POA: Diagnosis not present

## 2015-04-05 DIAGNOSIS — N390 Urinary tract infection, site not specified: Secondary | ICD-10-CM | POA: Diagnosis not present

## 2015-04-05 DIAGNOSIS — Z8249 Family history of ischemic heart disease and other diseases of the circulatory system: Secondary | ICD-10-CM

## 2015-04-05 DIAGNOSIS — R109 Unspecified abdominal pain: Secondary | ICD-10-CM | POA: Insufficient documentation

## 2015-04-05 DIAGNOSIS — B961 Klebsiella pneumoniae [K. pneumoniae] as the cause of diseases classified elsewhere: Secondary | ICD-10-CM | POA: Diagnosis present

## 2015-04-05 DIAGNOSIS — N184 Chronic kidney disease, stage 4 (severe): Secondary | ICD-10-CM | POA: Diagnosis present

## 2015-04-05 DIAGNOSIS — J9601 Acute respiratory failure with hypoxia: Secondary | ICD-10-CM | POA: Diagnosis present

## 2015-04-05 DIAGNOSIS — G4733 Obstructive sleep apnea (adult) (pediatric): Secondary | ICD-10-CM | POA: Diagnosis present

## 2015-04-05 DIAGNOSIS — K5981 Ogilvie syndrome: Secondary | ICD-10-CM | POA: Diagnosis present

## 2015-04-05 DIAGNOSIS — G8929 Other chronic pain: Secondary | ICD-10-CM | POA: Diagnosis present

## 2015-04-05 DIAGNOSIS — J449 Chronic obstructive pulmonary disease, unspecified: Secondary | ICD-10-CM | POA: Diagnosis present

## 2015-04-05 DIAGNOSIS — I5032 Chronic diastolic (congestive) heart failure: Secondary | ICD-10-CM

## 2015-04-05 DIAGNOSIS — E1142 Type 2 diabetes mellitus with diabetic polyneuropathy: Secondary | ICD-10-CM | POA: Diagnosis present

## 2015-04-05 DIAGNOSIS — N401 Enlarged prostate with lower urinary tract symptoms: Secondary | ICD-10-CM | POA: Diagnosis present

## 2015-04-05 DIAGNOSIS — Z950 Presence of cardiac pacemaker: Secondary | ICD-10-CM

## 2015-04-05 DIAGNOSIS — I129 Hypertensive chronic kidney disease with stage 1 through stage 4 chronic kidney disease, or unspecified chronic kidney disease: Secondary | ICD-10-CM | POA: Diagnosis present

## 2015-04-05 DIAGNOSIS — K5909 Other constipation: Secondary | ICD-10-CM | POA: Diagnosis present

## 2015-04-05 DIAGNOSIS — K5900 Constipation, unspecified: Secondary | ICD-10-CM | POA: Diagnosis not present

## 2015-04-05 DIAGNOSIS — R0602 Shortness of breath: Secondary | ICD-10-CM | POA: Diagnosis present

## 2015-04-05 DIAGNOSIS — Z7401 Bed confinement status: Secondary | ICD-10-CM | POA: Diagnosis not present

## 2015-04-05 DIAGNOSIS — E1143 Type 2 diabetes mellitus with diabetic autonomic (poly)neuropathy: Secondary | ICD-10-CM | POA: Insufficient documentation

## 2015-04-05 DIAGNOSIS — Z6841 Body Mass Index (BMI) 40.0 and over, adult: Secondary | ICD-10-CM

## 2015-04-05 DIAGNOSIS — R1084 Generalized abdominal pain: Secondary | ICD-10-CM | POA: Diagnosis not present

## 2015-04-05 DIAGNOSIS — K598 Other specified functional intestinal disorders: Secondary | ICD-10-CM

## 2015-04-05 DIAGNOSIS — I509 Heart failure, unspecified: Secondary | ICD-10-CM

## 2015-04-05 DIAGNOSIS — K5989 Other specified functional intestinal disorders: Secondary | ICD-10-CM | POA: Insufficient documentation

## 2015-04-05 LAB — CBC WITH DIFFERENTIAL/PLATELET
BASOS ABS: 0 10*3/uL (ref 0.0–0.1)
Basophils Relative: 0 % (ref 0–1)
EOS PCT: 4 % (ref 0–5)
Eosinophils Absolute: 0.3 10*3/uL (ref 0.0–0.7)
HEMATOCRIT: 33.8 % — AB (ref 39.0–52.0)
HEMOGLOBIN: 10.7 g/dL — AB (ref 13.0–17.0)
LYMPHS PCT: 9 % — AB (ref 12–46)
Lymphs Abs: 0.7 10*3/uL (ref 0.7–4.0)
MCH: 29.2 pg (ref 26.0–34.0)
MCHC: 31.7 g/dL (ref 30.0–36.0)
MCV: 92.1 fL (ref 78.0–100.0)
Monocytes Absolute: 0.8 10*3/uL (ref 0.1–1.0)
Monocytes Relative: 10 % (ref 3–12)
NEUTROS ABS: 6.2 10*3/uL (ref 1.7–7.7)
NEUTROS PCT: 77 % (ref 43–77)
PLATELETS: 174 10*3/uL (ref 150–400)
RBC: 3.67 MIL/uL — AB (ref 4.22–5.81)
RDW: 13.2 % (ref 11.5–15.5)
WBC: 8.1 10*3/uL (ref 4.0–10.5)

## 2015-04-05 LAB — GLUCOSE, CAPILLARY: Glucose-Capillary: 91 mg/dL (ref 65–99)

## 2015-04-05 LAB — COMPREHENSIVE METABOLIC PANEL
ALK PHOS: 105 U/L (ref 38–126)
ALT: 17 U/L (ref 17–63)
AST: 19 U/L (ref 15–41)
Albumin: 3.7 g/dL (ref 3.5–5.0)
Anion gap: 6 (ref 5–15)
BUN: 34 mg/dL — AB (ref 6–20)
CHLORIDE: 107 mmol/L (ref 101–111)
CO2: 27 mmol/L (ref 22–32)
CREATININE: 1.98 mg/dL — AB (ref 0.61–1.24)
Calcium: 8.6 mg/dL — ABNORMAL LOW (ref 8.9–10.3)
GFR calc Af Amer: 35 mL/min — ABNORMAL LOW (ref >60)
GFR, EST NON AFRICAN AMERICAN: 30 mL/min — AB (ref >60)
Glucose, Bld: 67 mg/dL (ref 65–99)
Potassium: 5.2 mmol/L — ABNORMAL HIGH (ref 3.5–5.1)
Sodium: 140 mmol/L (ref 135–145)
Total Bilirubin: 0.6 mg/dL (ref 0.3–1.2)
Total Protein: 7.8 g/dL (ref 6.5–8.1)

## 2015-04-05 LAB — TROPONIN I

## 2015-04-05 LAB — PROTIME-INR
INR: 2.27 — ABNORMAL HIGH (ref 0.00–1.49)
PROTHROMBIN TIME: 24.9 s — AB (ref 11.6–15.2)

## 2015-04-05 LAB — BRAIN NATRIURETIC PEPTIDE: B Natriuretic Peptide: 317.1 pg/mL — ABNORMAL HIGH (ref 0.0–100.0)

## 2015-04-05 LAB — D-DIMER, QUANTITATIVE: D-Dimer, Quant: <0.27 ug/mL-FEU (ref 0.00–0.48)

## 2015-04-05 MED ORDER — SODIUM CHLORIDE 0.9 % IV SOLN
INTRAVENOUS | Status: DC
  Administered 2015-04-05: 16:00:00 via INTRAVENOUS

## 2015-04-05 MED ORDER — WARFARIN - PHARMACIST DOSING INPATIENT
Freq: Every day | Status: DC
  Administered 2015-04-07 – 2015-04-14 (×4)

## 2015-04-05 MED ORDER — FUROSEMIDE 10 MG/ML IJ SOLN
100.0000 mg | Freq: Once | INTRAVENOUS | Status: AC
  Administered 2015-04-05: 100 mg via INTRAVENOUS
  Filled 2015-04-05: qty 10

## 2015-04-05 MED ORDER — WARFARIN SODIUM 7.5 MG PO TABS
7.5000 mg | ORAL_TABLET | Freq: Every day | ORAL | Status: DC
  Administered 2015-04-05 – 2015-04-08 (×4): 7.5 mg via ORAL
  Filled 2015-04-05 (×4): qty 1

## 2015-04-05 NOTE — ED Notes (Signed)
Pt and family updated regarding admission and transfer to [facility]. Family concerned over previous negative experience. Experience was conveyed to [employer] at [facility]. Report given and transport requested.

## 2015-04-05 NOTE — H&P (Signed)
Family Medicine Teaching [facility] Admission History and Physical Service Pager: [phone]  Patient name: Tyler [name] Medical record number: [id number] Date of birth: July 12, 1934 Age: 81 y.o. Gender: male  Primary Care Provider: [name], MD Consultants: Cardiology Code Status: Full  Chief Complaint: Short of breath  Assessment and Plan: Tyler [name] is a 81 y.o. male presenting with recurrent hypoxemic respiratory failure from acute CHF exacerbation. PMH is significant for HFpEF, HTN, HLD, T2DM, OSA, OHS, SSS and PAF s/p dual chamber pacer 2013,   Acute hypoxemic respiratory failure: Recurrent CHF exacerbation primary cause due to holding diuretic due to renal impairment. Limited pulmonary reserve, OSA (noncompliant with CPAP), OHS, remote history of tobacco use likely contributing. No signs of infection, ACS, or DVT/PE.  - Wean oxygen as tolerated to off vs. new home oxygen requirement.  - Continue allergy/reactive airway treatments: flonase, loratadine, singulair, formulary LABA/ICS, prn albuterol  Acute on chronic HFpEF: Pro-BNP elevated to 317. stable cardiomegaly on CXR. Echo on recent admission with EF 55 - 60%, G2DD. 291 lbs on admission, up 12lbs from 279lbs (?EDW) on discharge 8/24.  - STOP NS @ 125cc/hr.  - Diuresis with lasix 40mg  IV BID (had 100mg  IV in ED). Home diuretic is torsemide 50mg  daily.  - Daily weights, strict I/O - Cardiology consult in AM - Hold beta blocker given low suspicion for ACS.  - Hydralazine 10mg  IV q2h prn SBP > 170 - Continue statin (ongoing consideration of risk v. benefit as technically aged out of statin benefit group)  - Repeat troponin x 1 for low likelihood ACS r/o  Arrhythmias: Sick sinus, paroxysmal AFib/flutter: Therapeutic anticoagulation on coumadin with dual chamber pacer in situ.  - Anticoagulation per pharmacy  Hyperkalemia: Mild on admission at 5.2, was 5.9 as outpatient recently.  - Start IV loop diuretic  and recheck in AM.   Stage III CKD: Difficult balance to strike in setting of brittle pulmonary status and CHF. Creatinine improved from discharge possibly due to holding diuretic. Will monitor this as diuresis proceeds.  - Milk of Magnesia outpatient prn constipation treatment should be discontinued at discharge due to SCr persistently > 2mg /dl.  - Consider renal consult in AM  ID-T2DM: Well, possibly too tightly, controlled - Hb A1c 6.6% on 9/8. Though longstanding sequelae likely include nephropathy, neuropathy, and gastroparesis (Dx w/gastroscintigraphy 2006). EMR states lantus dose is 27u qAM and 45u qPM and novolog SSI.  - Decrease home insulin to lantus 35u qAM (did not receive PM dose) and start resistant SSI qAC/HS - Continue neurontin 100mg  TID  Abdominal distention: Non surgical. Noted on last hospitalization worked up with negative U/S. PCP seems to think related to constipation.  - KUB - Continue reglan 5mg  qHS; consider increasing this dose and/or frequency if diabetic gastroparesis is thought to be contributing. - Continue formulary PPI for GERD, doubt this is contributing - Continue miralax  Chronic pain:  - Continue norco home dose.  - Discontinue home mobic given CHF   Social: Pt is being treated for cognitive impairment. Mr. Davis daughter, [name], requests daily updates to which the patient has consented verbally. I have told her that definitive plans for the day can be relayed to her around 1:45 - 2:00pm daily after rounds and noon conference. She will be present at this time 9/13 and has asked that she be called if not able to be present during this time.  - I have updated her in detail about the above plans. - Continue donepezil  History of recurrent Klebsiella UTI due to BOO from BPH: Recently treated, now asymptomatic with benign U/A on 9/8. Of note, has positive Cx's from July 2013, April and July 2014, Aug 2015.  - Low threshold for repeat U/A and bladder scan  if symptoms recur or urinary retention suspected. Consider further urologic investigation as outpatient.  - Continue myrbetriq; will order cardura as well, consider stopping redundant additional alpha-blocker flomax - Hold elavil for concern of worsening retention  Chronic RLE wound: Followed at wound care clinic as outpatient.  - WOC recommendations appreciated.   FEN/GI: Heart healthy/carb modified diet, saline lock IV Prophylaxis: Therapeutic coumadin  Disposition: Admit on tele for gentle diuresis. Consult CSW for likely dispo back to ALF.  History of Present Illness:  Tyler [name] is a 81 y.o. male presenting with dyspnea, LE swelling and orthopnea.   Most of history derived from Daughter, [name], who reports that [name] never really got back to baseline upon discharge on 8/24 when hospitalized for CHF exacerbation. He continued to take all medications as directed but continued to feel more short of breath when ambulating. Course was gradual over the past 2 weeks until seen by his PCP, Dr. Cyndia Bent, earlier this week and told to hold his diuretic because he appeared dehydrated. This worsened symptoms prompting trip to [facility] ED where he was found to have stigmata of recurrent acute CHF, so he was transferred to [facility] for FMTS readmission.   He reports increased orthopnea (sleeps in hospital bed), enlargement of his abdomen which is somewhat painful described as "tense," and occasional palpitations. He denies fever, chills, daily weighing, cough or sputum, wheezing, chest pain, nausea, vomiting, changes in bowel or bladder habits. Reports no dysuria, increased urgency or frequency.   His daughter requests that his cardiologist Corinda Gubler) see him while in the hospital. Pt's PCP is Dr. Cyndia [facility] at [facility] Clinic encounter from today was reviewed, but no note is available at time of admission.   Review Of Systems: Per HPI. Otherwise 12 point review of systems  was performed and was unremarkable.  Patient Active Problem List   Diagnosis Date Noted  . Shortness of breath 03/15/2015  . CHF (congestive heart failure) 03/15/2015  . SOB (shortness of breath) 03/15/2015  . Pacemaker-St.Jude 06/10/2012  . 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 Constipation, chronic 05/30/2012  . Warfarin anticoagulation 05/30/2012  . Obesity 05/30/2012  . OSA (obstructive sleep apnea)-noncompliant with CPAP 05/30/2012  . Junctional rhythm 05/29/2012  . Bifascicular block 05/29/2012  . CKD (chronic kidney disease) stage 3, GFR 30-59 ml/min 05/29/2012  . Diabetes mellitus, type II 11/26/2009  . Hyperlipidemia 11/26/2009  . ANEMIA-NOS 11/26/2009  . Essential hypertension 11/26/2009  . ATRIAL FIBRILLATION, PAROXYSMAL 11/26/2009  . ALLERGIC RHINITIS 11/26/2009  . ASTHMA 08/16/2006  . GERD 08/16/2006  . GASTROPARESIS 08/16/2006  . ABNORMAL RESULT, FUNCTION STUDY, LIVER 08/16/2006   Past Medical History: Past Medical History  Diagnosis Date  . Diabetes mellitus    . Hyperlipidemia    . Hypertension    . Asthma   . Allergic rhinitis   . GERD (gastroesophageal reflux disease)   . BPH (benign prostatic hypertrophy)     s/p thermo therapy, has bladdero utlet obst. and instablitiy, incontient  . Anemia   . Chronic constipation   . Sick sinus syndrome     a. s/p STJ dual chamber pacemaker  . Paroxysmal atrial fibrillation     a. status post direct cardioversion b. anticoagulated with  Warfarin  . Obesity   . Sleep apnea     noncomplicance w/ CPAP  . Gastroparesis     dx in 2006 through gastric scan  . Gynecomastia     nomral mammograms 11/2008  . Chronic kidney disease   . Globus sensation     2012 EGD, Ba swallow, both negative.  MBSS 2012 negative for dysphagia.   Marland Kitchen Atypical atrial flutter     a. CL b. able to be pace terminated   Past Surgical History: Past Surgical History  Procedure Laterality Date  . Cataract extraction      bilateral  .  Rotator cuff repair      left  . Cardiac catheterization  2003    normal, left ventricular function (done after an abnormal cardiolite)  . Tumor removal      off arms  . Permanent pacemaker insertion N/A 06/07/2012    STJ Assurity dual chamber pacemaker implanted by Dr [name] for SSS   Social History: Social History  Substance Use Topics  . Smoking status: Former Smoker    Types: Cigarettes    Quit date: 07/25/1971  . Smokeless tobacco: Former Neurosurgeon    Types: Chew  . Alcohol Use: No   Additional social history: Lives in [facility] Please also refer to relevant sections of EMR.  Family History: Family History  Problem Relation Age of Onset  . Colon cancer Neg Hx   . Prostate cancer Maternal Uncle   . Heart disease Paternal Uncle     x 3, paternal aunt   Allergies and Medications: Allergies  Allergen Reactions  . Neosporin Original [Bacitracin-Neomycin-Polymyxin]     Rinaldo Cloud McWhite from [facility] [phone] states pt is allergic to Neosporin and Polysporin ointments.   No current facility-administered medications on file prior to encounter.   Current Outpatient Prescriptions on File Prior to Encounter  Medication Sig Dispense Refill  . albuterol (VENTOLIN HFA) 108 (90 BASE) MCG/ACT inhaler Inhale 2 puffs into the lungs every 4 (four) hours as needed. For shortness of breath    . donepezil (ARICEPT) 10 MG tablet Take 10 mg by mouth at bedtime.      Marland Kitchen doxazosin (CARDURA) 2 MG tablet Take 1 tablet (2 mg total) by mouth at bedtime. 30 tablet 0  . ferrous sulfate 325 (65 FE) MG tablet Take 325 mg by mouth daily with breakfast.      . fluticasone (FLONASE) 50 MCG/ACT nasal spray Place 2 sprays into both nostrils daily.    . Fluticasone-Salmeterol (ADVAIR) 100-50 MCG/DOSE AEPB Inhale 1 puff into the lungs 2 (two) times daily. Rinse mouth and spit after each use    . Foot Care Products (PREVALON HEEL PROTECTOR) MISC Apply the boot to the left foot to protect the heel at  night and during the day when patient is in his chair 1 each 0  . gabapentin (NEURONTIN) 100 MG capsule Take 100 mg by mouth 3 (three) times daily.    Marland Kitchen HYDROcodone-acetaminophen (NORCO/VICODIN) 5-325 MG per tablet Take 1 tablet by mouth every 8 (eight) hours as needed for severe pain.     Marland Kitchen insulin aspart (NOVOLOG) 100 UNIT/ML injection Inject into the skin 3 (three) times daily before meals. 10 units before breakfast and lunch , 12units before dinner along with sliding scale    . insulin glargine (LANTUS) 100 UNIT/ML injection 27 units in the morning and 45 units at bedtime    . loratadine (CLARITIN) 10 MG tablet Take 10 mg  by mouth daily.    . magnesium hydroxide (MILK OF MAGNESIA) 400 MG/5ML suspension Take 30 mLs by mouth daily as needed for mild constipation.    . meloxicam (MOBIC) 7.5 MG tablet Take 7.5 mg by mouth daily.    . metoprolol tartrate (LOPRESSOR) 25 MG tablet Take 1 tablet (25 mg total) by mouth 2 (two) times daily. 180 tablet 3  . mirabegron ER (MYRBETRIQ) 50 MG TB24 tablet Take 50 mg by mouth daily.    . montelukast (SINGULAIR) 10 MG tablet Take 10 mg by mouth at bedtime.     . Multiple Vitamin (DAILY VITE) TABS Take by mouth daily.    Marland Kitchen omeprazole (PRILOSEC) 40 MG capsule Take 40 mg by mouth daily.    . polyethylene glycol (MIRALAX / GLYCOLAX) packet Take 17 g by mouth daily. Take on Mon.. Wed. and Fri.    . ranitidine (ZANTAC) 150 MG capsule Take 150 mg by mouth 2 (two) times daily.    . sertraline (ZOLOFT) 25 MG tablet Take 25 mg by mouth daily.      . simvastatin (ZOCOR) 40 MG tablet Take 40 mg by mouth daily at 6 PM.    . tamsulosin (FLOMAX) 0.4 MG CAPS capsule Take 0.4 mg by mouth daily.    Marland Kitchen torsemide (DEMADEX) 100 MG tablet Take 50 mg by mouth daily.      . vitamin C (ASCORBIC ACID) 500 MG tablet Take 500 mg by mouth daily.    . Vitamin D, Ergocalciferol, (DRISDOL) 50000 UNITS CAPS Take 50,000 Units by mouth every 7 (seven) days. Fridays    . nitrofurantoin,  macrocrystal-monohydrate, (MACROBID) 100 MG capsule Take 1 capsule (100 mg total) by mouth 2 (two) times daily. (Patient not taking: Reported on 04/05/2015) 2 capsule 0    Objective: BP 163/56 mmHg  Pulse 58  Temp(Src) 98.7 F (37.1 C) (Oral)  Resp 22  Ht 5\' 10"  (1.778 m)  Wt 291 lb 12.8 oz (132.36 kg)  BMI 41.87 kg/m2  SpO2 99% Exam: General: Pleasant elderly man laying with elevated HOB resting quietly in no distress Eyes: Small, symmetric, reactive pupils with cataracts L > R and arcus senilis ENTM: Geographic tongue, oropharynx clear Neck: Large, supple Cardiovascular: Regular, distant S1S2 without murmur, rub or gallop. JVD difficult to determine.  Respiratory: Nonlabored on 6L O2 by [place]. Faintly decreased sounds at bilateral bases. No crackles, wheezes, rhonchi. Abdomen: Protuberant without tenderness or fluid wave. + bowel sounds.  Ext: 1+ Bilateral dependent edema. LE's glossy without hair, DP pulses 1+ bilaterally Skin: Venous stasis changes bilateral LEs, RLE wound wrapped in ACE bandage and foam dressing not examined.  Neuro: Alert and oriented, social, 5/5 extremity strength with decreased LE sensation in stocking pattern.  Labs and Imaging: CBC BMET   Recent Labs Lab 04/05/15 1537  WBC 8.1  HGB 10.7*  HCT 33.8*  PLT 174    Recent Labs Lab 04/05/15 1537  NA 140  K 5.2*  CL 107  CO2 27  BUN 34*  CREATININE 1.98*  GLUCOSE 67  CALCIUM 8.6*     BNP: 317 Tn: < 0.03 D-Dimer: < 0.27 PT - INR: 2.27 - 24.9 CXR: Cardiomegaly and small left pleural effusion stable from last imaging. No obvious pulmonary edema or infiltrate. ECG: AV dual-paced rhythm at 60bpm  PREVIOUS ADMISSION DATA: Echo cardiogram: 55-60%. No RWMA. Grade 2 diastolic dysfunction.  Abdominal U/S: 1. No cholelithiasis or sonographic evidence of acute cholecystitis. 2. No ascites.   Tyrone Nine, MD  04/05/2015, 11:12 PM PGY-3, [facility] FPTS Intern pager: [phone], text  pages welcome

## 2015-04-05 NOTE — ED Notes (Addendum)
Patient is from [facility] . Pt was seen at Primary care office to day for lab draw for Creatinine when he became SOB. Pt has been off of Lasix per M.D. Recommendation. Pt is A&O x 4. Pt c/o gastric distention and c/o pain 5/10. Denied N/V

## 2015-04-05 NOTE — Progress Notes (Signed)
ANTICOAGULATION CONSULT NOTE  Pharmacy Consult for Coumadin Indication: atrial fibrillation  Allergies  Allergen Reactions  . Neosporin Original [Bacitracin-Neomycin-Polymyxin]     Rinaldo Cloud McWhite from [facility] [phone] states pt is allergic to Neosporin and Polysporin ointments.    Patient Measurements:    Vital Signs: Temp: 98 F (36.7 C) (09/12 1957) Temp Source: Oral (09/12 1930) BP: 153/50 mmHg (09/12 1957) Pulse Rate: 59 (09/12 1957)  Labs:  Recent Labs  04/05/15 1537  HGB 10.7*  HCT 33.8*  PLT 174  LABPROT 24.9*  INR 2.27*  CREATININE 1.98*  TROPONINI <0.03    CrCl cannot be calculated (Unknown ideal weight.).    Assessment: 81 y.o. male transferred from [facility] with symptoms c/w CHF, h/o Afib.  On Coumadin pta 7.5 mg daily at [facility] for Afib- last dose 9/11 at 1700. Pharmacy to dose inpatient. INR therapeutic at  2.27. No bleeding reported.  CBC stable compared to CBC on 03/17/15.   Goal of Therapy:  INR 2-3   Plan:  continue home dose - Warfarin 7.5mg  daily for now Daily INR for now Mon s/sx bleeding  Herby Abraham, Pharm.D. [phone] 04/05/2015 9:15 PM

## 2015-04-05 NOTE — Progress Notes (Signed)
Admitted pt from [place].AAOx3. ivf infusing at 125 ml/hr paged admitting MD.oriented to room and call bell.tele on. SR up x3. Bed alarm on

## 2015-04-05 NOTE — ED Provider Notes (Signed)
CSN: [id number]     Arrival date & time 04/05/15  1355 History   First MD Initiated Contact with Patient 04/05/15 1502     Chief Complaint  Patient presents with  . Shortness of Breath     (Consider location/radiation/quality/duration/timing/severity/associated sxs/prior Treatment) HPI Comments: Pt told by his pcp to hold his lasix and dyspnea increased--also with increased whole body edema and orthopnea  Patient is a 81 y.o. male presenting with shortness of breath. The history is provided by the patient.  Shortness of Breath Severity:  Moderate Onset quality:  Gradual Duration:  2 weeks Timing:  Constant Progression:  Worsening Chronicity:  Recurrent Context: activity   Relieved by:  Nothing Worsened by:  Activity and coughing Ineffective treatments:  None tried Associated symptoms: cough   Associated symptoms: no fever, no syncope and no vomiting     Past Medical History  Diagnosis Date  . Diabetes mellitus    . Hyperlipidemia    . Hypertension    . Asthma   . Allergic rhinitis   . GERD (gastroesophageal reflux disease)   . BPH (benign prostatic hypertrophy)     s/p thermo therapy, has bladdero utlet obst. and instablitiy, incontient  . Anemia   . Chronic constipation   . Sick sinus syndrome     a. s/p STJ dual chamber pacemaker  . Paroxysmal atrial fibrillation     a. status post direct cardioversion b. anticoagulated with Warfarin  . Obesity   . Sleep apnea     noncomplicance w/ CPAP  . Gastroparesis     dx in 2006 through gastric scan  . Gynecomastia     nomral mammograms 11/2008  . Chronic kidney disease   . Globus sensation     2012 EGD, Ba swallow, both negative.  MBSS 2012 negative for dysphagia.   Marland Kitchen Atypical atrial flutter     a. CL b. able to be pace terminated   Past Surgical History  Procedure Laterality Date  . Cataract extraction      bilateral  . Rotator cuff repair      left  . Cardiac catheterization  2003    normal, left  ventricular function (done after an abnormal cardiolite)  . Tumor removal      off arms  . Permanent pacemaker insertion N/A 06/07/2012    STJ Assurity dual chamber pacemaker implanted by Dr [name] for SSS   Family History  Problem Relation Age of Onset  . Colon cancer Neg Hx   . Prostate cancer Maternal Uncle   . Heart disease Paternal Uncle     x 3, paternal aunt   Social History  Substance Use Topics  . Smoking status: Former Smoker    Types: Cigarettes    Quit date: 07/25/1971  . Smokeless tobacco: Former Neurosurgeon    Types: Chew  . Alcohol Use: No    Review of Systems  Constitutional: Negative for fever.  Respiratory: Positive for cough and shortness of breath.   Cardiovascular: Negative for syncope.  Gastrointestinal: Negative for vomiting.  All other systems reviewed and are negative.     Allergies  Neosporin original  Home Medications   Prior to Admission medications   Medication Sig Start Date End Date Taking? Authorizing Provider  amitriptyline (ELAVIL) 25 MG tablet Take 25 mg by mouth at bedtime.   Yes Historical Provider, MD  donepezil (ARICEPT) 10 MG tablet Take 10 mg by mouth at bedtime.     Yes Historical Provider, MD  doxazosin (  CARDURA) 2 MG tablet Take 1 tablet (2 mg total) by mouth at bedtime. 06/10/12  Yes Rhetta Mura, MD  ferrous sulfate 325 (65 FE) MG tablet Take 325 mg by mouth daily with breakfast.     Yes Historical Provider, MD  fluticasone (FLONASE) 50 MCG/ACT nasal spray Place 2 sprays into both nostrils daily.   Yes Historical Provider, MD  Fluticasone-Salmeterol (ADVAIR) 100-50 MCG/DOSE AEPB Inhale 1 puff into the lungs 2 (two) times daily. Rinse mouth and spit after each use   Yes Historical Provider, MD  gabapentin (NEURONTIN) 100 MG capsule Take 100 mg by mouth 3 (three) times daily.   Yes Historical Provider, MD  insulin glargine (LANTUS) 100 UNIT/ML injection 27 units in the morning and 45 units at bedtime 06/10/12  Yes Rhetta Mura, MD  loratadine (CLARITIN) 10 MG tablet Take 10 mg by mouth daily.   Yes Historical Provider, MD  meloxicam (MOBIC) 7.5 MG tablet Take 7.5 mg by mouth daily.   Yes Historical Provider, MD  albuterol (VENTOLIN HFA) 108 (90 BASE) MCG/ACT inhaler Inhale 2 puffs into the lungs every 4 (four) hours as needed. For shortness of breath    Historical Provider, MD  Azelastine-Fluticasone 137-50 MCG/ACT SUSP Place 2 sprays into the nose 2 (two) times daily as needed.     Historical Provider, MD  Bepotastine Besilate 1.5 % SOLN     Historical Provider, MD  cetaphil (CETAPHIL) lotion Apply 1 application topically daily.     Historical Provider, MD  cetirizine (ZYRTEC) 10 MG tablet Take 10 mg by mouth daily.    Historical Provider, MD  Foot Care Products (PREVALON HEEL PROTECTOR) MISC Apply the boot to the left foot to protect the heel at night and during the day when patient is in his chair 10/30/13   Delories Heinz, DPM  HYDROcodone-acetaminophen (NORCO/VICODIN) 5-325 MG per tablet Take 1 tablet by mouth every 6 (six) hours as needed for severe pain.     Historical Provider, MD  insulin aspart (NOVOLOG) 100 UNIT/ML injection Inject into the skin 3 (three) times daily before meals. 10 units before breakfast and lunch , 12units before dinner    Historical Provider, MD  magnesium hydroxide (MILK OF MAGNESIA) 400 MG/5ML suspension Take 30 mLs by mouth daily as needed for mild constipation.    Historical Provider, MD  metoprolol tartrate (LOPRESSOR) 25 MG tablet Take 1 tablet (25 mg total) by mouth 2 (two) times daily. 12/28/14   Amber Caryl Bis, NP  mirabegron ER (MYRBETRIQ) 50 MG TB24 tablet Take 50 mg by mouth daily.    Historical Provider, MD  montelukast (SINGULAIR) 10 MG tablet Take 10 mg by mouth at bedtime.     Historical Provider, MD  Multiple Vitamin (DAILY VITE) TABS Take by mouth daily. 08/25/14   Historical Provider, MD  nitrofurantoin, macrocrystal-monohydrate, (MACROBID) 100 MG capsule Take 1  capsule (100 mg total) by mouth 2 (two) times daily. 03/17/15   Joanna Puff, MD  omeprazole (PRILOSEC) 40 MG capsule Take 40 mg by mouth daily.    Historical Provider, MD  polyethylene glycol (MIRALAX / GLYCOLAX) packet Take 17 g by mouth as directed. Take on Mon.. Wed. and Fri.    Historical Provider, MD  ranitidine (ZANTAC) 150 MG capsule Take 150 mg by mouth 2 (two) times daily.    Historical Provider, MD  sertraline (ZOLOFT) 25 MG tablet Take 25 mg by mouth daily.      Historical Provider, MD  simvastatin (ZOCOR) 40 MG  tablet Take 40 mg by mouth daily at 6 PM.    Historical Provider, MD  tamsulosin (FLOMAX) 0.4 MG CAPS capsule Take 0.4 mg by mouth daily.    Historical Provider, MD  torsemide (DEMADEX) 100 MG tablet Take 50 mg by mouth daily.      Historical Provider, MD  vitamin C (ASCORBIC ACID) 500 MG tablet Take 500 mg by mouth daily. 08/25/14 08/25/15  Historical Provider, MD  Vitamin D, Ergocalciferol, (DRISDOL) 50000 UNITS CAPS Take 50,000 Units by mouth every 7 (seven) days. Fridays    Historical Provider, MD  warfarin (COUMADIN) 7.5 MG tablet Take 7.5 mg by mouth one time only at 6 PM.    Historical Provider, MD   BP 142/51 mmHg  Pulse 60  Temp(Src) 97.9 F (36.6 C) (Oral)  Resp 18  SpO2 100% Physical Exam  Constitutional: He is oriented to person, place, and time. He appears well-developed and well-nourished.  Non-toxic appearance. No distress.  HENT:  Head: Normocephalic and atraumatic.  Eyes: Conjunctivae, EOM and lids are normal. Pupils are equal, round, and reactive to light.  Neck: Normal range of motion. Neck supple. No tracheal deviation present. No thyroid mass present.  Cardiovascular: Normal rate, regular rhythm and normal heart sounds.  Exam reveals no gallop.   No murmur heard. Pulmonary/Chest: Effort normal. No stridor. No respiratory distress. He has decreased breath sounds. He has no wheezes. He has rhonchi. He has no rales.  Abdominal: Soft. Normal appearance  and bowel sounds are normal. He exhibits no distension. There is no tenderness. There is no rebound and no CVA tenderness.  Musculoskeletal: Normal range of motion. He exhibits no edema or tenderness.  3 plus bilatreal pitting edema  Neurological: He is alert and oriented to person, place, and time. He has normal strength. No cranial nerve deficit or sensory deficit. GCS eye subscore is 4. GCS verbal subscore is 5. GCS motor subscore is 6.  Skin: Skin is warm and dry. No abrasion and no rash noted.  Psychiatric: He has a normal mood and affect. His speech is normal and behavior is normal.  Nursing note and vitals reviewed.   ED Course  Procedures (including critical care time) Labs Review Labs Reviewed  BRAIN NATRIURETIC PEPTIDE  TROPONIN I  CBC WITH DIFFERENTIAL/PLATELET  COMPREHENSIVE METABOLIC PANEL  PROTIME-INR    Imaging Review No results found. I have personally reviewed and evaluated these images and lab results as part of my medical decision-making.   EKG Interpretation   Date/Time:  Monday April 05 2015 15:38:40 EDT Ventricular Rate:  60 PR Interval:  180 QRS Duration: 168 QT Interval:  459 QTC Calculation: 459 R Axis:   -64 Text Interpretation:  Atrial-ventricular dual-paced rhythm No further  analysis attempted due to paced rhythm Confirmed by Assunta Pupo  MD, Naturi Alarid  ([id number]) on 04/05/2015 5:56:21 PM      MDM   Final diagnoses:  SOB (shortness of breath)    Patient here with clinical symptoms consistent with CHF. Patient given his normal dose of diabetic care. He is oxygen requiring at this time. We'll consult family practice for admission  Lorre Nick, MD 04/05/15 1759

## 2015-04-05 NOTE — ED Notes (Signed)
Bed: WA09 Expected date:  Expected time:  Means of arrival:  Comments: EMS 

## 2015-04-06 ENCOUNTER — Encounter (HOSPITAL_BASED_OUTPATIENT_CLINIC_OR_DEPARTMENT_OTHER): Payer: Medicare Other | Attending: General Surgery

## 2015-04-06 ENCOUNTER — Inpatient Hospital Stay (HOSPITAL_COMMUNITY): Payer: Medicare Other

## 2015-04-06 DIAGNOSIS — I480 Paroxysmal atrial fibrillation: Secondary | ICD-10-CM

## 2015-04-06 DIAGNOSIS — I5033 Acute on chronic diastolic (congestive) heart failure: Secondary | ICD-10-CM

## 2015-04-06 DIAGNOSIS — N183 Chronic kidney disease, stage 3 (moderate): Secondary | ICD-10-CM

## 2015-04-06 DIAGNOSIS — K5901 Slow transit constipation: Secondary | ICD-10-CM | POA: Insufficient documentation

## 2015-04-06 DIAGNOSIS — R140 Abdominal distension (gaseous): Secondary | ICD-10-CM | POA: Insufficient documentation

## 2015-04-06 LAB — PROTIME-INR
INR: 2.26 — ABNORMAL HIGH (ref 0.00–1.49)
PROTHROMBIN TIME: 24.7 s — AB (ref 11.6–15.2)

## 2015-04-06 LAB — GLUCOSE, CAPILLARY
GLUCOSE-CAPILLARY: 79 mg/dL (ref 65–99)
GLUCOSE-CAPILLARY: 109 mg/dL — AB (ref 65–99)
GLUCOSE-CAPILLARY: 240 mg/dL — AB (ref 65–99)
Glucose-Capillary: 218 mg/dL — ABNORMAL HIGH (ref 65–99)

## 2015-04-06 LAB — BASIC METABOLIC PANEL
ANION GAP: 6 (ref 5–15)
ANION GAP: 5 (ref 5–15)
BUN: 29 mg/dL — AB (ref 6–20)
BUN: 31 mg/dL — ABNORMAL HIGH (ref 6–20)
CALCIUM: 8.5 mg/dL — AB (ref 8.9–10.3)
CHLORIDE: 108 mmol/L (ref 101–111)
CO2: 27 mmol/L (ref 22–32)
CO2: 28 mmol/L (ref 22–32)
Calcium: 8.5 mg/dL — ABNORMAL LOW (ref 8.9–10.3)
Chloride: 108 mmol/L (ref 101–111)
Creatinine, Ser: 2.08 mg/dL — ABNORMAL HIGH (ref 0.61–1.24)
Creatinine, Ser: 2.11 mg/dL — ABNORMAL HIGH (ref 0.61–1.24)
GFR, EST AFRICAN AMERICAN: 33 mL/min — AB (ref >60)
GFR, EST AFRICAN AMERICAN: 33 mL/min — AB (ref >60)
GFR, EST NON AFRICAN AMERICAN: 29 mL/min — AB (ref >60)
GFR, EST NON AFRICAN AMERICAN: 28 mL/min — AB (ref >60)
GLUCOSE: 111 mg/dL — AB (ref 65–99)
Glucose, Bld: 115 mg/dL — ABNORMAL HIGH (ref 65–99)
POTASSIUM: 4.8 mmol/L (ref 3.5–5.1)
POTASSIUM: 5.3 mmol/L — AB (ref 3.5–5.1)
SODIUM: 141 mmol/L (ref 135–145)
SODIUM: 141 mmol/L (ref 135–145)

## 2015-04-06 LAB — TROPONIN I: TROPONIN I: 0.03 ng/mL (ref <0.031)

## 2015-04-06 MED ORDER — FUROSEMIDE 10 MG/ML IJ SOLN: 80.0000 mg | Freq: Two times a day (BID) | INTRAMUSCULAR | Status: DC

## 2015-04-06 MED ORDER — HYDRALAZINE HCL 20 MG/ML IJ SOLN: 10.0000 mg | INTRAMUSCULAR | Status: DC | PRN

## 2015-04-06 MED ORDER — DONEPEZIL HCL 10 MG PO TABS
10.0000 mg | ORAL_TABLET | Freq: Every day | ORAL | Status: DC
  Administered 2015-04-06 – 2015-04-20 (×16): 10 mg via ORAL
  Filled 2015-04-06 (×16): qty 1

## 2015-04-06 MED ORDER — METOCLOPRAMIDE HCL 5 MG PO TABS
5.0000 mg | ORAL_TABLET | Freq: Every day | ORAL | Status: DC
  Administered 2015-04-06: 5 mg via ORAL
  Filled 2015-04-06: qty 1

## 2015-04-06 MED ORDER — SODIUM CHLORIDE 0.9 % IJ SOLN: 3.0000 mL | INTRAMUSCULAR | Status: DC | PRN

## 2015-04-06 MED ORDER — HYDROCODONE-ACETAMINOPHEN 5-325 MG PO TABS: 1.0000 | ORAL_TABLET | Freq: Three times a day (TID) | ORAL | Status: DC | PRN

## 2015-04-06 MED ORDER — INSULIN GLARGINE 100 UNIT/ML ~~LOC~~ SOLN
35.0000 [IU] | Freq: Every day | SUBCUTANEOUS | Status: DC
  Administered 2015-04-06 – 2015-04-07 (×3): 35 [IU] via SUBCUTANEOUS
  Filled 2015-04-06 (×4): qty 0.35

## 2015-04-06 MED ORDER — MOMETASONE FURO-FORMOTEROL FUM 100-5 MCG/ACT IN AERO
2.0000 | INHALATION_SPRAY | Freq: Two times a day (BID) | RESPIRATORY_TRACT | Status: DC
  Filled 2015-04-06: qty 8.8

## 2015-04-06 MED ORDER — PANTOPRAZOLE SODIUM 40 MG PO TBEC
40.0000 mg | DELAYED_RELEASE_TABLET | Freq: Every day | ORAL | Status: DC
  Administered 2015-04-06 – 2015-04-13 (×8): 40 mg via ORAL
  Filled 2015-04-06 (×9): qty 1

## 2015-04-06 MED ORDER — MIRABEGRON ER 50 MG PO TB24
50.0000 mg | ORAL_TABLET | Freq: Every day | ORAL | Status: DC
  Administered 2015-04-06 – 2015-04-21 (×16): 50 mg via ORAL
  Filled 2015-04-06 (×17): qty 1

## 2015-04-06 MED ORDER — SIMVASTATIN 40 MG PO TABS: 40.0000 mg | ORAL_TABLET | Freq: Every day | ORAL | Status: DC

## 2015-04-06 MED ORDER — MUPIROCIN CALCIUM 2 % EX CREA
1.0000 "application " | TOPICAL_CREAM | Freq: Every day | CUTANEOUS | Status: DC
  Administered 2015-04-06 – 2015-04-21 (×16): 1 via TOPICAL
  Filled 2015-04-06 (×2): qty 15

## 2015-04-06 MED ORDER — SODIUM CHLORIDE 0.9 % IJ SOLN
3.0000 mL | Freq: Two times a day (BID) | INTRAMUSCULAR | Status: DC
  Administered 2015-04-07 – 2015-04-13 (×2): 3 mL via INTRAVENOUS

## 2015-04-06 MED ORDER — DOXAZOSIN MESYLATE 2 MG PO TABS
2.0000 mg | ORAL_TABLET | Freq: Every day | ORAL | Status: DC
  Administered 2015-04-06: 2 mg via ORAL
  Filled 2015-04-06 (×2): qty 1

## 2015-04-06 MED ORDER — FUROSEMIDE 10 MG/ML IJ SOLN
40.0000 mg | Freq: Once | INTRAMUSCULAR | Status: AC
  Administered 2015-04-06: 40 mg via INTRAVENOUS
  Filled 2015-04-06: qty 4

## 2015-04-06 MED ORDER — FUROSEMIDE 10 MG/ML IJ SOLN
10.0000 mg/h | INTRAVENOUS | Status: DC
  Administered 2015-04-06 – 2015-04-11 (×5): 10 mg/h via INTRAVENOUS
  Filled 2015-04-06 (×10): qty 25

## 2015-04-06 MED ORDER — INSULIN ASPART 100 UNIT/ML ~~LOC~~ SOLN
0.0000 [IU] | Freq: Three times a day (TID) | SUBCUTANEOUS | Status: DC
  Administered 2015-04-06: 7 [IU] via SUBCUTANEOUS
  Administered 2015-04-07: 4 [IU] via SUBCUTANEOUS
  Administered 2015-04-07: 7 [IU] via SUBCUTANEOUS
  Administered 2015-04-07: 4 [IU] via SUBCUTANEOUS
  Administered 2015-04-08: 20 [IU] via SUBCUTANEOUS
  Administered 2015-04-08: 15 [IU] via SUBCUTANEOUS
  Administered 2015-04-08: 7 [IU] via SUBCUTANEOUS
  Administered 2015-04-09: 11 [IU] via SUBCUTANEOUS
  Administered 2015-04-09: 7 [IU] via SUBCUTANEOUS
  Administered 2015-04-09: 15 [IU] via SUBCUTANEOUS
  Administered 2015-04-10 (×2): 20 [IU] via SUBCUTANEOUS
  Administered 2015-04-10: 3 [IU] via SUBCUTANEOUS
  Administered 2015-04-11 (×3): 7 [IU] via SUBCUTANEOUS
  Administered 2015-04-12: 4 [IU] via SUBCUTANEOUS
  Administered 2015-04-12 (×2): 15 [IU] via SUBCUTANEOUS
  Administered 2015-04-13: 11 [IU] via SUBCUTANEOUS
  Administered 2015-04-13: 4 [IU] via SUBCUTANEOUS
  Administered 2015-04-13: 15 [IU] via SUBCUTANEOUS
  Administered 2015-04-14 (×2): 11 [IU] via SUBCUTANEOUS
  Administered 2015-04-14: 15 [IU] via SUBCUTANEOUS
  Administered 2015-04-15: 4 [IU] via SUBCUTANEOUS
  Administered 2015-04-15: 11 [IU] via SUBCUTANEOUS
  Administered 2015-04-15: 7 [IU] via SUBCUTANEOUS
  Administered 2015-04-16: 3 [IU] via SUBCUTANEOUS
  Administered 2015-04-16: 4 [IU] via SUBCUTANEOUS
  Administered 2015-04-16: 3 [IU] via SUBCUTANEOUS
  Administered 2015-04-17 (×3): 4 [IU] via SUBCUTANEOUS

## 2015-04-06 MED ORDER — MONTELUKAST SODIUM 10 MG PO TABS
10.0000 mg | ORAL_TABLET | Freq: Every day | ORAL | Status: DC
  Administered 2015-04-06 – 2015-04-20 (×16): 10 mg via ORAL
  Filled 2015-04-06 (×16): qty 1

## 2015-04-06 MED ORDER — FUROSEMIDE 10 MG/ML IJ SOLN: 60.0000 mg | Freq: Two times a day (BID) | INTRAMUSCULAR | Status: DC

## 2015-04-06 MED ORDER — SERTRALINE HCL 50 MG PO TABS
25.0000 mg | ORAL_TABLET | Freq: Every day | ORAL | Status: DC
  Administered 2015-04-06 – 2015-04-21 (×16): 25 mg via ORAL
  Filled 2015-04-06 (×16): qty 1

## 2015-04-06 MED ORDER — MOMETASONE FURO-FORMOTEROL FUM 100-5 MCG/ACT IN AERO
2.0000 | INHALATION_SPRAY | Freq: Two times a day (BID) | RESPIRATORY_TRACT | Status: DC
  Administered 2015-04-06 – 2015-04-21 (×24): 2 via RESPIRATORY_TRACT
  Filled 2015-04-06 (×4): qty 8.8

## 2015-04-06 MED ORDER — ALBUTEROL SULFATE (2.5 MG/3ML) 0.083% IN NEBU: 3.0000 mL | INHALATION_SOLUTION | RESPIRATORY_TRACT | Status: DC | PRN

## 2015-04-06 MED ORDER — FUROSEMIDE 10 MG/ML IJ SOLN: 40.0000 mg | Freq: Two times a day (BID) | INTRAMUSCULAR | Status: DC

## 2015-04-06 MED ORDER — METOCLOPRAMIDE HCL 10 MG PO TABS
10.0000 mg | ORAL_TABLET | Freq: Three times a day (TID) | ORAL | Status: DC
  Filled 2015-04-06: qty 1

## 2015-04-06 MED ORDER — PRAVASTATIN SODIUM 40 MG PO TABS: 40.0000 mg | ORAL_TABLET | Freq: Every day | ORAL | Status: DC

## 2015-04-06 MED ORDER — SODIUM CHLORIDE 0.9 % IV SOLN: 250.0000 mL | INTRAVENOUS | Status: DC | PRN

## 2015-04-06 MED ORDER — AMIODARONE HCL 200 MG PO TABS
200.0000 mg | ORAL_TABLET | Freq: Two times a day (BID) | ORAL | Status: DC
  Administered 2015-04-06 – 2015-04-20 (×29): 200 mg via ORAL
  Filled 2015-04-06 (×29): qty 1

## 2015-04-06 MED ORDER — ERYTHROMYCIN LACTOBIONATE 500 MG IV SOLR
250.0000 mg | Freq: Three times a day (TID) | INTRAVENOUS | Status: DC
Start: 2015-04-06 — End: 2015-04-07
  Administered 2015-04-06 – 2015-04-07 (×3): 250 mg via INTRAVENOUS
  Filled 2015-04-06 (×5): qty 5

## 2015-04-06 MED ORDER — FUROSEMIDE 10 MG/ML IJ SOLN
60.0000 mg | Freq: Two times a day (BID) | INTRAMUSCULAR | Status: DC
  Administered 2015-04-06: 60 mg via INTRAVENOUS
  Filled 2015-04-06: qty 6

## 2015-04-06 MED ORDER — GABAPENTIN 100 MG PO CAPS
100.0000 mg | ORAL_CAPSULE | Freq: Three times a day (TID) | ORAL | Status: DC
  Administered 2015-04-06 – 2015-04-21 (×47): 100 mg via ORAL
  Filled 2015-04-06 (×47): qty 1

## 2015-04-06 MED ORDER — LORATADINE 10 MG PO TABS
10.0000 mg | ORAL_TABLET | Freq: Every day | ORAL | Status: DC
  Administered 2015-04-06 – 2015-04-21 (×16): 10 mg via ORAL
  Filled 2015-04-06 (×16): qty 1

## 2015-04-06 MED ORDER — SODIUM CHLORIDE 0.9 % IJ SOLN
3.0000 mL | Freq: Two times a day (BID) | INTRAMUSCULAR | Status: DC
Start: 2015-04-06 — End: 2015-04-21
  Administered 2015-04-06 – 2015-04-21 (×24): 3 mL via INTRAVENOUS

## 2015-04-06 MED ORDER — TAMSULOSIN HCL 0.4 MG PO CAPS
0.4000 mg | ORAL_CAPSULE | Freq: Every day | ORAL | Status: DC
  Administered 2015-04-06 – 2015-04-21 (×16): 0.4 mg via ORAL
  Filled 2015-04-06 (×17): qty 1

## 2015-04-06 MED ORDER — POLYETHYLENE GLYCOL 3350 17 G PO PACK
17.0000 g | PACK | ORAL | Status: DC
  Administered 2015-04-07: 17 g via ORAL
  Filled 2015-04-06 (×2): qty 1

## 2015-04-06 MED ORDER — SODIUM CHLORIDE 0.9 % IV SOLN
400.0000 mg | Freq: Three times a day (TID) | INTRAVENOUS | Status: DC
  Filled 2015-04-06 (×3): qty 8

## 2015-04-06 NOTE — Progress Notes (Signed)
Family Medicine Teaching Service Daily Progress Note Intern Pager: [phone]  Patient name: Tyler [name] Medical record number: [id number] Date of birth: 05-26-1934 Age: 81 y.o. Gender: male  Primary Care Provider: [name], MD Consultants: Cardiology Code Status: Full  Pt Overview and Major Events to Date:  9/12: Admitted for shortness of breath concerning for CHF exacerbation  Assessment and Plan:  Tyler [name] is a 81 y.o. male presenting with recurrent hypoxemic respiratory failure from acute CHF exacerbation. PMH is significant for HFpEF, HTN, HLD, T2DM, OSA, OHS, SSS and PAF s/p dual chamber pacer 2013,   Acute hypoxemic respiratory failure: Recurrent CHF exacerbation with symptoms likely contributed to by protuberant abdomen - Wean oxygen as tolerated to off vs. new home oxygen requirement, will have PT assess O2 status as CHF exacerbation improves - Continue allergy/reactive airway treatments: flonase, loratadine, singulair, formulary LABA/ICS, prn albuterol  Acute on chronic HFpEF: Pro-BNP elevated to 317. Weight 275 << 291 on admission in the ED, Weight on discharge 8/24 was 279 ( ? DW) stable cardiomegaly on CXR. Echo on recent admission with EF 55 - 60%, G2DD. - Diuresis with lasix 60mg  IV BID. Home diuretic is torsemide 50mg  daily.  - Daily weights, strict I/O - Cardiology consult today, appreciate recs - Hold beta blocker given low suspicion for ACS.  - Hydralazine 10mg  IV q2h prn SBP > 170 - Continue statin  - troponins neg x2  Arrhythmias: Sick sinus, paroxysmal AFib/flutter: Therapeutic anticoagulation on coumadin with dual chamber pacer in situ.  - Anticoagulation per pharmacy  Hyperkalemia: Mild on admission at 5.2, was 5.9 as outpatient recently.  - Lasix IV - F/u BMP this AM, 9/13  Stage III CKD: SCr 1.98 improved from discharge on 8/24, today 2.08 likely given aggressive diuresis.  - Continue to monitor closely given need to diuresis  in setting of CHF - Milk of Magnesia outpatient prn constipation treatment should be discontinued at discharge due to SCr persistently > 2mg /dl.    ID-T2DM: Well, possibly too tightly, controlled - Hb A1c 6.6% on 9/8. Though longstanding sequelae likely include nephropathy, neuropathy, and gastroparesis (Dx w/gastroscintigraphy 2006). EMR states lantus dose is 27u qAM and 45u qPM and novolog SSI.  - Decrease home insulin to lantus 35u qAM (did not receive PM dose) and start resistant SSI qAC/HS - consider d/c on this dose - Continue neurontin 100mg  TID  Abdominal distention: Stable mildly tender but unchanged from baseline. Negative [place] on last admission. Pt reports daily BMs with daily miralax  - KUB - On reglan 5mg  qHS; very minute dose, and given daily BMs with miralax will consider stopping it - Continue formulary PPI for GERD, doubt this is contributing - Continue miralax  Chronic pain:  - Continue norco home dose.  - Discontinue home mobic given CHF   Social: Pt is being treated for cognitive impairment. Tyler [name] daughter, Tyler [name], requests daily updates to which the patient has consented verbally.She had been told that definitive plans for the day can be relayed to her around 1:45 - 2:00pm daily after rounds and noon conference. - I have updated her in detail about the above plans. - Continue donepezil  History of recurrent Klebsiella UTI due to Bladder outlet obstruction from BPH: Recently treated, now asymptomatic with benign U/A on 9/8. Of note, has positive Cx's from July 2013, April and July 2014, Aug 2015.  - Low threshold for repeat U/A and bladder scan if symptoms recur or urinary retention suspected. Consider further urologic investigation  as outpatient.  - Continue myrbetriq;flomax, hold cardura as redundant therapy - Hold elavil for concern of worsening retention  Chronic RLE wound: Followed at wound care clinic as outpatient.  - WOC recommendations  appreciated.   FEN/GI: Heart healthy/carb modified diet, saline lock IV Prophylaxis: Therapeutic coumadin  Disposition: Admit on tele for gentle diuresis. Consult CSW for likely dispo back to ALF.   Subjective:  Reports continued SOB but denied chest pain. Denies difficulty urinating but states that he does have difficulty ambulating to the toilet due to abdominal girth  Objective: Temp:  [97.1 F (36.2 C)-98.7 F (37.1 C)] 98.3 F (36.8 C) (09/13 0600) Pulse Rate:  [58-61] 60 (09/13 0600) Resp:  [18-28] 19 (09/13 0600) BP: (138-163)/(44-56) 138/44 mmHg (09/13 0600) SpO2:  [95 %-100 %] 100 % (09/13 0600) Weight:  [275 lb 1.6 oz (124.785 kg)-291 lb 12.8 oz (132.36 kg)] 275 lb 1.6 oz (124.785 kg) (09/13 0600) Physical Exam: General: NAD, sitting in bed eating breakfast Cardiovascular: RRR,  Respiratory: CTAB, normal WOB on 2L Abdomen: taught, protuberant abdomen, mildly tender diffusely Extremities: 1+ b/l LE edema, non tender   Laboratory:  Recent Labs Lab 04/05/15 1537  WBC 8.1  HGB 10.7*  HCT 33.8*  PLT 174    Recent Labs Lab 04/05/15 1537 04/06/15 0315  NA 140 141  K 5.2* 4.8  CL 107 108  CO2 27 27  BUN 34* 29*  CREATININE 1.98* 2.08*  CALCIUM 8.6* 8.5*  PROT 7.8  --   BILITOT 0.6  --   ALKPHOS 105  --   ALT 17  --   AST 19  --   GLUCOSE 67 115*      Imaging/Diagnostic Tests: CXR 9/12  IMPRESSION: Persistent cardiomegaly and small left pleural effusion. No frank edema or consolidation. No appreciable change compared to recent prior study.  Bonney Aid, MD 04/06/2015, 9:22 AM PGY-2, [facility] FPTS Intern pager: [phone], text pages welcome

## 2015-04-06 NOTE — Progress Notes (Signed)
Patient emptied 550cc of clear urine, no odor noted. He also had a large incontinent episode. PVR at less than 22cc of urine.

## 2015-04-06 NOTE — Evaluation (Signed)
Occupational Therapy Evaluation Patient Details Name: Tyler [name] MRN: [id number] DOB: [birth year] Today's Date: 04/06/2015    History of Present Illness 81 y.o. male admitted with dyspnea and ileus. PMH is significant for CHF, SSS s/p pacemaker, a flutter/fib on warfarin, CKD, HTN, T2DM, HLD, BPH, Asthma, OSA, anemia   Clinical Impression   Pt was assisted for bathing and dressing prior to admission and could perform transfers to his w/c and propel his manual w/c.  Pt presents with weakness, decreased balance and lethargy this visit.  Required +2 assist to return to bed from chair.  Will follow acutely.    Follow Up Recommendations  Home health OT;Supervision/Assistance - 24 hour (at ALF)    Equipment Recommendations       Recommendations for Other Services       Precautions / Restrictions Precautions Precautions: Fall Precaution Comments: pt with h/o falls and progressive decline in his mobility.  Restrictions Weight Bearing Restrictions: No      Mobility Bed Mobility Overal bed mobility: Needs Assistance Bed Mobility: Sit to Supine       Sit to supine: Mod assist   General bed mobility comments: assist for LEs back in bed  Transfers Overall transfer level: Needs assistance Equipment used: Rolling walker (2 wheeled) Transfers: Sit to/from [employer] Transfers Sit to Stand: +2 physical assistance;Min assist Stand pivot transfers: +2 physical assistance;Min assist       General transfer comment: assist for balance and to rise from chair    Balance     Sitting balance-Leahy Scale: Fair       Standing balance-Leahy Scale: Poor                              ADL Overall ADL's : Needs assistance/impaired Eating/Feeding: Independent;Sitting   Grooming: Set up;Wash/dry hands;Wash/dry face;Sitting   Upper Body Bathing: Sitting;Minimal assitance   Lower Body Bathing: +2 for physical assistance;Total assistance;Sit to/from stand    Upper Body Dressing : Minimal assistance;Sitting   Lower Body Dressing: +2 for physical assistance;Total assistance;Sit to/from stand   Toilet Transfer: +2 for safety/equipment;Minimal assistance;Stand-pivot;RW   Toileting- Clothing Manipulation and Hygiene: +2 for physical assistance;Total assistance;Sit to/from stand               Vision     Perception     Praxis      Pertinent Vitals/Pain Pain Assessment: No/denies pain Pain Score: 3  Pain Location: abdomen Pain Descriptors / Indicators: Sore Pain Intervention(s): Limited activity within patient's tolerance     Hand Dominance Right   Extremity/Trunk Assessment Upper Extremity Assessment Upper Extremity Assessment: Generalized weakness   Lower Extremity Assessment Lower Extremity Assessment: Defer to PT evaluation RLE Deficits / Details: bil hip flexion 5/5, knee extension 5/5, knee flexion 3/5 LLE Deficits / Details: bil hip flexion 5/5, knee extension 5/5, knee flexion 3/5   Cervical / Trunk Assessment Cervical / Trunk Assessment: Normal   Communication Communication Communication: HOH   Cognition Arousal/Alertness: Lethargic Behavior During Therapy: Flat affect Overall Cognitive Status: Within Functional Limits for tasks assessed                     General Comments       Exercises       Shoulder Instructions      Home Living Family/patient expects to be discharged to:: Assisted living Living Arrangements: Alone Available Help at Discharge: Personal care attendant;Available 24 hours/day Type of Home:  Assisted living Home Access: Level entry     Home Layout: One level               Home Equipment: Walker - 2 wheels;Walker - 4 wheels;Shower seat - built in;Grab bars - toilet;Grab bars - tub/shower;Adaptive equipment;Wheelchair - Equities trader: Reacher        Prior Functioning/Environment Level of Independence: Needs assistance  Gait / Transfers Assistance  Needed: pt can get OOB on his own and transfer to [facility], toilet, tub bench ADL's / Homemaking Assistance Needed: assist with meal prep, cleaning, and bathing/dressing as well as toilet hygiene        OT Diagnosis: Generalized weakness   OT Problem List: Decreased strength;Decreased activity tolerance;Impaired balance (sitting and/or standing);Decreased knowledge of use of DME or AE;Obesity;Cardiopulmonary status limiting activity   OT Treatment/Interventions: Self-care/ADL training;Therapeutic exercise;DME and/or AE instruction;Balance training;Patient/family education;Therapeutic activities    OT Goals(Current goals can be found in the care plan section) Acute Rehab OT Goals Patient Stated Goal: to go back to ALF OT Goal Formulation: With patient/family Time For Goal Achievement: 04/13/15 Potential to Achieve Goals: Fair ADL Goals Pt Will Transfer to Toilet: with supervision;stand pivot transfer;bedside commode Pt Will Perform Toileting - Clothing Manipulation and hygiene: with supervision;sit to/from stand  OT Frequency: Min 2X/week   Barriers to D/C:            Co-evaluation              End of Session    Activity Tolerance: Patient limited by fatigue Patient left: in bed;with call bell/phone within reach;with bed alarm set   Time: 1610-9604 OT Time Calculation (min): 32 min Charges:  OT General Charges $OT Visit: 1 Procedure OT Evaluation $Initial OT Evaluation Tier I: 1 Procedure OT Treatments $Self Care/Home Management : 8-22 mins G-Codes:    Evern Bio 04/06/2015, 3:53 PM [phone]

## 2015-04-06 NOTE — Consult Note (Addendum)
WOC wound consult note Reason for Consult: Consult requested for left great toe and right leg wounds.  Pt states he is followed by podiatry as an outpatient for left toe, and the outpatient wound care center for the right leg. Wound type: Full thickness to both sites. Measurement: Left great toe with full thickness wound; .8X1.2cm, dry eschar, no odor or drainage or fluctuance. Right outer upper calf full thickness wound; 7X4X.2cm and 1.5X2.5X.1cm.  Both are 100% red and moist, no odor,mod amt yellow drainage.  Generalized edema to RLE Dressing procedure/placement/frequency: Podiatry had previously prescribed Iodosorb ointment to left great toe; this topical treatment is not available in the [facility] formulary.  Plan to substitute Bactroban until patient is discharged;  this is a similar antimicrobial product. Alginate to absorb drainage to right outer leg wounds, with ace wrap for light compression.  Pt can resume previous plan of care after discharge and follow-up with podiatry and the wound care center; discussed with daughter at the bedside.  Please re-consult if further assistance is needed.  Thank-you,  Cammie Mcgee MSN, RN, CWOCN, Andrew, CNS [phone]

## 2015-04-06 NOTE — Discharge Instructions (Signed)

## 2015-04-06 NOTE — Evaluation (Signed)
Physical Therapy Evaluation Patient Details Name: LADARIEN BEEKS MRN: [id number] DOB: 27-Aug-1933 Today's Date: 04/06/2015   History of Present Illness  81 y.o. male admitted with dyspnea and ileus. PMH is significant for CHF, SSS s/p pacemaker, a flutter/fib on warfarin, CKD, HTN, T2DM, HLD, BPH, Asthma, OSA, anemia  Clinical Impression  Pt reports continued decline in function over the last several years and would like to return to limited gait. Pt spends most of the time in his WC using bil UE and LE to mobilize chair with tendency for pushing chair backward due to limited hamstring strength. Pt with decreased activity tolerance and function who will benefit from acute therapy to maximize mobility, gait and function.     Follow Up Recommendations Home health PT    Equipment Recommendations  None recommended by PT    Recommendations for Other Services       Precautions / Restrictions Precautions Precautions: Fall Precaution Comments: pt with h/o falls and progressive decline in his mobility.  Restrictions Weight Bearing Restrictions: No      Mobility  Bed Mobility               General bed mobility comments: up in chair on arrival  Transfers Overall transfer level: Needs assistance   Transfers: Sit to/from Stand Sit to Stand: Supervision         General transfer comment: cues for hand placement with transfers pt with controlled descent  Ambulation/Gait Ambulation/Gait assistance: Supervision Ambulation Distance (Feet): 6 Feet Assistive device: Rolling walker (2 wheeled) Gait Pattern/deviations: Shuffle;Trunk flexed;Wide base of support   Gait velocity interpretation: Below normal speed for age/gender General Gait Details: pt with flexed posture with cues for position in RW and erect posture, limited by fatigue  Stairs            Wheelchair Mobility    Modified Rankin (Stroke Patients Only)       Balance     Sitting balance-Leahy Scale:  Fair       Standing balance-Leahy Scale: Poor                               Pertinent Vitals/Pain Pain Score: 3  Pain Location: abdomen Pain Descriptors / Indicators: Sore Pain Intervention(s): Limited activity within patient's tolerance  Unable to obtain sats on pulse ox, no acute distress and on 2L throughout    Home Living Family/patient expects to be discharged to:: Assisted living Living Arrangements: Alone Available Help at Discharge: Personal care attendant Type of Home: Apartment Home Access: Level entry     Home Layout: One level Home Equipment: Walker - 2 wheels;Walker - 4 wheels;Shower seat - built in;Grab bars - toilet;Grab bars - tub/shower;Adaptive equipment;Wheelchair - manual      Prior Function Level of Independence: Needs assistance   Gait / Transfers Assistance Needed: pt can get OOB on his own and transfer to [facility], toilet, tub bench  ADL's / Homemaking Assistance Needed: assist with meal prep, cleaning, and bathing/dressing as well as toilet hygiene        Hand Dominance        Extremity/Trunk Assessment   Upper Extremity Assessment: Generalized weakness           Lower Extremity Assessment: RLE deficits/detail;LLE deficits/detail RLE Deficits / Details: bil hip flexion 5/5, knee extension 5/5, knee flexion 3/5 LLE Deficits / Details: bil hip flexion 5/5, knee extension 5/5, knee flexion 3/5  Cervical / Trunk  Assessment: Normal  Communication   Communication: HOH  Cognition Arousal/Alertness: Awake/alert Behavior During Therapy: Flat affect Overall Cognitive Status: Within Functional Limits for tasks assessed                      General Comments      Exercises General Exercises - Lower Extremity Long Arc Quad: AROM;Both;10 reps;Seated Heel Slides: AROM;Seated;Both;10 reps      Assessment/Plan    PT Assessment Patient needs continued PT services  PT Diagnosis Difficulty walking;Generalized weakness   PT  Problem List Decreased strength;Decreased activity tolerance;Decreased balance;Decreased mobility;Decreased knowledge of use of DME;Obesity  PT Treatment Interventions DME instruction;Gait training;Functional mobility training;Therapeutic activities;Therapeutic exercise;Balance training;Patient/family education   PT Goals (Current goals can be found in the Care Plan section) Acute Rehab PT Goals Patient Stated Goal: to go back to ALF PT Goal Formulation: With patient/family Time For Goal Achievement: 04/20/15 Potential to Achieve Goals: Good    Frequency Min 3X/week   Barriers to discharge        Co-evaluation               End of Session Equipment Utilized During Treatment: Gait belt;Oxygen Activity Tolerance: Patient tolerated treatment well Patient left: in chair;with call bell/phone within reach;with chair alarm set;with nursing/sitter in room Nurse Communication: Mobility status;Precautions         Time: 1610-9604 PT Time Calculation (min) (ACUTE ONLY): 24 min   Charges:   PT Evaluation $Initial PT Evaluation Tier I: 1 Procedure     PT G CodesDelorse Lek 04/06/2015, 12:23 PM Delaney Meigs, PT [phone]

## 2015-04-06 NOTE — Progress Notes (Signed)
ANTICOAGULATION CONSULT NOTE  Pharmacy Consult for Coumadin Indication: atrial fibrillation  Allergies  Allergen Reactions  . Neosporin Original [Bacitracin-Neomycin-Polymyxin]     Tyler [name] from [facility] [phone] states pt is allergic to Neosporin and Polysporin ointments.    Patient Measurements: Height:  (177.8 cm) Weight: 275 lb 1.6 oz (124.785 kg) IBW/kg (Calculated) : 73  Vital Signs: Temp: 98.3 F (36.8 C) (09/13 0600) Temp Source: Oral (09/13 0600) BP: 138/44 mmHg (09/13 0600) Pulse Rate: 60 (09/13 0600)  Labs:  Recent Labs  04/05/15 1537 04/06/15 0315  HGB 10.7*  --   HCT 33.8*  --   PLT 174  --   LABPROT 24.9* 24.7*  INR 2.27* 2.26*  CREATININE 1.98* 2.08*  TROPONINI <0.03 0.03    Estimated Creatinine Clearance: 38.2 mL/min (by C-G formula based on Cr of 2.08).  Assessment: 81 y.o. male transferred from [facility] with symptoms c/w CHF, h/o Afib.  On Coumadin pta 7.5 mg daily at [facility] for Afib- last dose 9/11 at 1700. Pharmacy to dose inpatient. INR therapeutic on admission and remains stable at 2.26. Hgb 10.7, plts wnl. No s/s of bleed  Goal of Therapy:  INR 2-3   Plan:  Continue home dose - Coumadin 7.5mg  daily Monitor daily INR, CBC, s/s of bleed  Enzo Bi, PharmD Clinical Pharmacist Pager [phone] 04/06/2015 9:42 AM

## 2015-04-06 NOTE — Progress Notes (Signed)
PT Cancellation Note  Patient Details Name: Tyler [name] MRN: [id number] DOB: Dec 16, 1933   Cancelled Treatment:    Reason Eval/Treat Not Completed: Patient at procedure or test/unavailable   Toney Sang Beth 04/06/2015, 9:53 AM Delaney Meigs, PT [phone]

## 2015-04-06 NOTE — Consult Note (Addendum)
Advanced Heart Failure Team Consult Note  Referring Physician: Dr Pollie Meyer Primary Physician: Dr. Cyndia Bent Primary Cardiologist: Dr [name] HF: Ellis Parents Shirlee Latch)  Reason for Consultation: A/C Diastolic HF  HPI:    Tyler [name] is a 81 y.o. male with history of Chronic diastolic CHF, Echo 03/16/15 Echo EF 55-60%, Grade 2 DD, mildly dilated RV, hx of sick sinus syndrome s/p STJ dual chamber PCM 2013, paroxysmal a fib stable on coumadin, OHS/OSA non-compliant with CPAP, HTN, and DM.  He currently resides at an Assisted living facility. [facility] in [place].  His last cardiology appt was 12/28/14 with Gypsy Balsam, NP after remote pacemaker transmission showed increased atrial arrhythmia burden with ? associated SOB. He was discussed with Dr [name] and considered not to be a candidate for any EP procedures at that time with few symptoms, obesity, and poor functional capacity.  He was admitted on 8/22 with SOB and CP. He was seen by cardiology and had no obvious signs of volume overload or CHF exacerbation, though his IV lasix was increased as he had poor urine output at that time.  He was also treated for UTI that admission. Had Abd [place] for distention but no acute process seen. He diuresed on IV lasix. Discharge weight was 279.  He presented to [facility] 9/12 with worsening SOB, orthopnea, and edema after being told by his PCP to hold his diuretics in setting of worsening Cr. Pertinent admission lab include K 5.2, Cr 1.98, BNP 317.1, and negative troponins. CXR showed persistent cardiomegaly and small L pleural effusion. No frank edema or consolidation. No change from previous.  Daughter, Lendon Collar, present at bedside. Pt says he has been feeling bad over the past month with no obvious inciting factors.  Primary complaint is fatigue and SOB. He has gross abdominal distention that his PCP thinks is 2/2 constipation, recently started Linzess. Also says he "aches all over." He states he feels the same as his prior  admission, and in fact did not feel any better on discharge. His weights have been increasing at facility, with 286 lbs on Sunday. He is unsure of his baseline, but says he has been gaining weight since Easter of this year.  Estimates upwards of 20 lbs. Has SOB with minimal exertion and sometimes at rest.  He can do some of his ADLs. He gets dressed with minimal assistance but requires help bathing.  He says he has occasional non specific CP with no aggravating/relieving factors. He denies fever, chills, N/V/D, sick contacts or recent travel. He takes all medications as directed. He denies fluid or dietary non compliance.   Says "most of all, I want to feel better when I leave. I did not last time, and am only feeling worse."   Review of Systems: [y] = yes, [ ]  = no   General: Weight gain [y]; Weight loss [ ] ; Anorexia [ ] ; Fatigue [y]; Fever [ ] ; Chills [ ] ; Weakness [y]  Cardiac: Chest pain/pressure [y]; Resting SOB [y]; Exertional SOB [ ] ; Orthopnea [y]; Pedal Edema [y]; Palpitations [ ] ; Syncope [ ] ; Presyncope [ ] ; Paroxysmal nocturnal dyspnea[ ]   Pulmonary: Cough [y]; Wheezing[ ] ; Hemoptysis[ ] ; Sputum [ ] ; Snoring [y]  GI: Vomiting[ ] ; Dysphagia[ ] ; Melena[ ] ; Hematochezia [ ] ; Heartburn[ ] ; Abdominal pain [ ] ; Constipation [place] ]; Diarrhea [ ] ; BRBPR [ ]   GU: Hematuria[ ] ; Dysuria [ ] ; Nocturia[ ]   Vascular: Pain in legs with walking [ ] ; Pain in feet with lying flat [ ] ; Non-healing  sores ; Stroke ; TIA ; Slurred speech ;  Neuro: Headaches[ ] ; Vertigo[ ] ; Seizures[ ] ; Paresthesias[ ] ;Blurred vision ; Diplopia ; Vision changes   Ortho/Skin: Arthritis [y]; Joint pain [y]; Muscle pain ; Joint swelling ; Back Pain ; Rash   Psych: Depression[ ] ; Anxiety[ ]   Heme: Bleeding problems ; Clotting disorders ; Anemia   Endocrine: Diabetes [y]; Thyroid dysfunction[ ]   Home Medications Prior to Admission medications   Medication Sig Start Date End Date Taking?  Authorizing Provider  albuterol (VENTOLIN HFA) 108 (90 BASE) MCG/ACT inhaler Inhale 2 puffs into the lungs every 4 (four) hours as needed. For shortness of breath   Yes Historical Provider, MD  amitriptyline (ELAVIL) 25 MG tablet Take 25 mg by mouth at bedtime.   Yes Historical Provider, MD  donepezil (ARICEPT) 10 MG tablet Take 10 mg by mouth at bedtime.     Yes Historical Provider, MD  doxazosin (CARDURA) 2 MG tablet Take 1 tablet (2 mg total) by mouth at bedtime. 06/10/12  Yes Rhetta Mura, MD  ferrous sulfate 325 (65 FE) MG tablet Take 325 mg by mouth daily with breakfast.     Yes Historical Provider, MD  fluticasone (FLONASE) 50 MCG/ACT nasal spray Place 2 sprays into both nostrils daily.   Yes Historical Provider, MD  Fluticasone-Salmeterol (ADVAIR) 100-50 MCG/DOSE AEPB Inhale 1 puff into the lungs 2 (two) times daily. Rinse mouth and spit after each use   Yes Historical Provider, MD  Foot Care Products (PREVALON HEEL PROTECTOR) MISC Apply the boot to the left foot to protect the heel at night and during the day when patient is in his chair 10/30/13  Yes Delories Heinz, DPM  gabapentin (NEURONTIN) 100 MG capsule Take 100 mg by mouth 3 (three) times daily.   Yes Historical Provider, MD  HYDROcodone-acetaminophen (NORCO/VICODIN) 5-325 MG per tablet Take 1 tablet by mouth every 8 (eight) hours as needed for severe pain.    Yes Historical Provider, MD  insulin aspart (NOVOLOG) 100 UNIT/ML injection Inject into the skin 3 (three) times daily before meals. 10 units before breakfast and lunch , 12units before dinner along with sliding scale   Yes Historical Provider, MD  insulin glargine (LANTUS) 100 UNIT/ML injection 27 units in the morning and 45 units at bedtime 06/10/12  Yes Rhetta Mura, MD  loratadine (CLARITIN) 10 MG tablet Take 10 mg by mouth daily.   Yes Historical Provider, MD  metoCLOPramide (REGLAN) 10 MG tablet Take 5 mg by mouth at bedtime.   Yes Historical Provider, MD   metoprolol tartrate (LOPRESSOR) 25 MG tablet Take 1 tablet (25 mg total) by mouth 2 (two) times daily. 12/28/14  Yes Amber Caryl Bis, NP  mirabegron ER (MYRBETRIQ) 50 MG TB24 tablet Take 50 mg by mouth daily.   Yes Historical Provider, MD  montelukast (SINGULAIR) 10 MG tablet Take 10 mg by mouth at bedtime.    Yes Historical Provider, MD  Multiple Vitamin (DAILY VITE) TABS Take by mouth daily. 08/25/14  Yes Historical Provider, MD  omeprazole (PRILOSEC) 40 MG capsule Take 40 mg by mouth daily.   Yes Historical Provider, MD  polyethylene glycol (MIRALAX / GLYCOLAX) packet Take 17 g by mouth daily. Take on Mon.. Wed. and Fri.   Yes Historical Provider, MD  ranitidine (ZANTAC) 150 MG capsule Take 150 mg by mouth 2 (two) times daily.   Yes Historical Provider, MD  sertraline (ZOLOFT)  25 MG tablet Take 25 mg by mouth daily.     Yes Historical Provider, MD  simvastatin (ZOCOR) 40 MG tablet Take 40 mg by mouth daily at 6 PM.   Yes Historical Provider, MD  tamsulosin (FLOMAX) 0.4 MG CAPS capsule Take 0.4 mg by mouth daily.   Yes Historical Provider, MD  torsemide (DEMADEX) 100 MG tablet Take 50 mg by mouth daily.     Yes Historical Provider, MD  vitamin C (ASCORBIC ACID) 500 MG tablet Take 500 mg by mouth daily. 08/25/14 08/25/15 Yes Historical Provider, MD  Vitamin D, Ergocalciferol, (DRISDOL) 50000 UNITS CAPS Take 50,000 Units by mouth every 7 (seven) days. Fridays   Yes Historical Provider, MD  warfarin (COUMADIN) 7.5 MG tablet Take 7.5 mg by mouth daily. 03/26/15  Yes Historical Provider, MD    Past Medical History: Past Medical History  Diagnosis Date  . Diabetes mellitus    . Hyperlipidemia    . Hypertension    . Asthma   . Allergic rhinitis   . GERD (gastroesophageal reflux disease)   . BPH (benign prostatic hypertrophy)     s/p thermo therapy, has bladdero utlet obst. and instablitiy, incontient  . Anemia   . Chronic constipation   . Sick sinus syndrome     a. s/p STJ dual chamber pacemaker  .  Paroxysmal atrial fibrillation     a. status post direct cardioversion b. anticoagulated with Warfarin  . Obesity   . Sleep apnea     noncomplicance w/ CPAP  . Gastroparesis     dx in 2006 through gastric scan  . Gynecomastia     nomral mammograms 11/2008  . Chronic kidney disease   . Globus sensation     2012 EGD, Ba swallow, both negative.  MBSS 2012 negative for dysphagia.   Marland Kitchen Atypical atrial flutter     a. CL b. able to be pace terminated    Past Surgical History: Past Surgical History  Procedure Laterality Date  . Cataract extraction      bilateral  . Rotator cuff repair      left  . Cardiac catheterization  2003    normal, left ventricular function (done after an abnormal cardiolite)  . Tumor removal      off arms  . Permanent pacemaker insertion N/A 06/07/2012    STJ Assurity dual chamber pacemaker implanted by Dr [name] for SSS    Family History: Family History  Problem Relation Age of Onset  . Colon cancer Neg Hx   . Prostate cancer Maternal Uncle   . Heart disease Paternal Uncle     x 3, paternal aunt    Social History: Social History   Social History  . Marital Status: Widowed    Spouse Name: N/A  . Number of Children: 2  . Years of Education: N/A   Occupational History  . retired    Social History Main Topics  . Smoking status: Former Smoker    Types: Cigarettes    Quit date: 07/25/1971  . Smokeless tobacco: Former Neurosurgeon    Types: Chew  . Alcohol Use: No  . Drug Use: No  . Sexual Activity: Not Asked   Other Topics Concern  . None   Social History Narrative   Has lived in facility x aprox 4 years   Moved to [place] 08-2008   Does not have a POA   Does not have a living will    Allergies:  Allergies  Allergen Reactions  .  Neosporin Original [Bacitracin-Neomycin-Polymyxin]     Rinaldo Cloud McWhite from [facility] [phone] states pt is allergic to Neosporin and Polysporin ointments.    Objective:    Vital Signs:   Temp:   [97.1 F (36.2 C)-98.7 F (37.1 C)] 98.3 F (36.8 C) (09/13 0600) Pulse Rate:  [58-61] 60 (09/13 0600) Resp:  [18-28] 19 (09/13 0600) BP: (138-163)/(44-56) 138/44 mmHg (09/13 0600) SpO2:  [95 %-100 %] 100 % (09/13 0600) Weight:  [275 lb 1.6 oz (124.785 kg)-291 lb 12.8 oz (132.36 kg)] 275 lb 1.6 oz (124.785 kg) (09/13 0600) Last BM Date: 04/04/15  Weight change: Filed Weights   04/05/15 2124 04/06/15 0600  Weight: 291 lb 12.8 oz (132.36 kg) 275 lb 1.6 oz (124.785 kg)    Intake/Output:   Intake/Output Summary (Last 24 hours) at 04/06/15 1023 Last data filed at 04/06/15 0607  Gross per 24 hour  Intake    120 ml  Output   1300 ml  Net  -1180 ml     Physical Exam: General:  Obese, Chronically ill appearing. SOB with sentences. HEENT: normal Neck: supple. Thick, JVP difficult to assess but appears elevated. Carotids 2+ bilat; no bruits. No lymphadenopathy or thryomegaly appreciated. Cor: PMI nondisplaced. Regular rate & rhythm. No M/G/R appreciated Lungs: Diminished throughout. Crackles bibasilarly Abdomen: Obese, Tight, nontender, Markedly distended. No hepatosplenomegaly. No bruits or masses. Good bowel sounds. Extremities: no cyanosis, clubbing, rash. 2-3+ edema up into thighs bilaterally.  Covered wound on R LE.  Followed outpatient. Neuro: alert & orientedx3, cranial nerves grossly intact. moves all 4 extremities w/o difficulty. Affect pleasant  Telemetry:  A sensed V paced currently in 60s  Labs: Basic Metabolic Panel:  Recent Labs Lab 04/05/15 1537 04/06/15 0315  NA 140 141  K 5.2* 4.8  CL 107 108  CO2 27 27  GLUCOSE 67 115*  BUN 34* 29*  CREATININE 1.98* 2.08*  CALCIUM 8.6* 8.5*    Liver Function Tests:  Recent Labs Lab 04/05/15 1537  AST 19  ALT 17  ALKPHOS 105  BILITOT 0.6  PROT 7.8  ALBUMIN 3.7   No results for input(s): LIPASE, AMYLASE in the last 168 hours. No results for input(s): AMMONIA in the last 168 hours.  CBC:  Recent Labs Lab  04/05/15 1537  WBC 8.1  NEUTROABS 6.2  HGB 10.7*  HCT 33.8*  MCV 92.1  PLT 174    Cardiac Enzymes:  Recent Labs Lab 04/05/15 1537 04/06/15 0315  TROPONINI <0.03 0.03    BNP: BNP (last 3 results)  Recent Labs  03/15/15 1820 04/05/15 1537  BNP 195.8* 317.1*    ProBNP (last 3 results) No results for input(s): PROBNP in the last 8760 hours.   CBG:  Recent Labs Lab 03/30/15 1358 04/05/15 2111 04/06/15 0644  GLUCAP 123* 91 79    Coagulation Studies:  Recent Labs  04/05/15 1537 04/06/15 0315  LABPROT 24.9* 24.7*  INR 2.27* 2.26*    Other results: EKG: A-V paced with rate of 60s  Imaging: Dg Chest 2 View  04/05/2015   CLINICAL DATA:  Shortness of Breath  EXAM: CHEST  2 VIEW  COMPARISON:  March 15, 2015  FINDINGS: There is no edema or consolidation. There is a persistent small left pleural effusion. Heart is borderline enlarged with pulmonary vascularity within normal limits. Pacemaker leads are attached to the right atrium and right ventricle. No adenopathy. No pneumothorax.  IMPRESSION: Persistent cardiomegaly and small left pleural effusion. No frank edema or consolidation. No appreciable change compared  to recent prior study.   Electronically Signed   By: Bretta Bang III M.D.   On: 04/05/2015 16:21   Abd 1 View (kub)  04/06/2015   CLINICAL DATA:  Abdominal distention.  EXAM: ABDOMEN - 1 VIEW  COMPARISON:  06/07/2012.  FINDINGS: Cardiac pacer with lead tips in right atrium right ventricle. Air-filled loops of small and large bowel are noted. Minimal distention. A mild adynamic ileus cannot be excluded. No free air.  IMPRESSION: Cannot exclude mild adynamic ileus.  Exam otherwise unremarkable.   Electronically Signed   By: Maisie Fus  Register   On: 04/06/2015 09:25      Medications:     Current Medications: . donepezil  10 mg Oral QHS  . furosemide  60 mg Intravenous BID  . gabapentin  100 mg Oral TID  . insulin aspart  0-20 Units Subcutaneous TID  WC  . insulin glargine  35 Units Subcutaneous QHS  . loratadine  10 mg Oral Daily  . metoCLOPramide  5 mg Oral QHS  . mirabegron ER  50 mg Oral Daily  . mometasone-formoterol  2 puff Inhalation BID  . montelukast  10 mg Oral QHS  . pantoprazole  40 mg Oral Daily  . [START ON 04/07/2015] polyethylene glycol  17 g Oral Q M,W,F  . sertraline  25 mg Oral Daily  . simvastatin  40 mg Oral q1800  . sodium chloride  3 mL Intravenous Q12H  . sodium chloride  3 mL Intravenous Q12H  . tamsulosin  0.4 mg Oral Daily  . warfarin  7.5 mg Oral q1800  . Warfarin - Pharmacist Dosing Inpatient   Does not apply q1800     Infusions:      Assessment/Plan   1. Acute on chronic diastolic HF, EF 55-60%, grade 2 DD, mildly dilated RV. s/p STJ dual chamber PPM 2013 - Volume overloaded with 2-3+ edema into thighs. - Weight today 284 standing. Baseline unclear but at least 10 lbs + up.  - Will increase IV lasix from 60 -> 80 mg BID. - Hold BB in decompensation - No ACE/ARB with CKD Stage III-IV - Will add fluid restriction 2. Shortness of Breath - Multifactorial with complicated lung history of Asthma, OHS, OSA, and likely COPD component with 20 pack year history ( Quit in 26s). - Will discuss optimal evaluation and rx with MD.  Diuresis will likely help. - Will further discuss need for CPAP with patient. 3. Paroxysmal a fib - CHA2DS2VASC score at least 5 (7.2% risk of stroke per year) - On chronic coumadin - hx of cardioversion - NSR currently. Will have [employer] interrogate to assess arrhythmia burden. 4. AKI on CKD Stage III-IV - Will monitor closely with diuresis 5. Hyperkalemia - Will continue to monitor with diuresis.   6. HTN 7. OSA/OHS - Carries a diagnosis but refused repeat sleep study in June.  - Says he tried CPAP years ago, but wasn't really sure how to use it and didn't feel like it helped.  8. Hx of sick sinus syndrome/symptomatic bradycardia - s/p STJ dual chamber PPM 2013 as  above. 9. Abdominal distention - ? Related to constipation - Recently started on linzess - Had small BM yesterday, but states it has been several days since a satisfactory BM 10. DM  Length of Stay: 1  Graciella Freer PA-C 04/06/2015, 10:23 AM  Advanced Heart Failure Team Pager [phone] (M-F; 7a - 4p)  Please contact [facility] for night-coverage after hours (4p -7a )  and weekends on [email or web address]  Patient seen with PA, agree with the above note.   1. Acute on chronic diastolic CHF: I suspect that he has a significant component of RV failure as well in the setting of untreated OSA, ?OHS.  He is volume overloaded on exam and has gained a lot of weight recently.  He was in the hospital last month also with CHF but suspect that he was not fully diuresed before going home.   - Will give dose of Lasix 40 mg IV x 1 now then Lasix gtt 10 mg/hr.  2. CKD: Baseline creatinine around 2.  Will need to follow closely with diuresis, creatinine may improve as we lower renal venous pressure.   3. OSA, ?OHS: Untreated OSA, question OHS.  I suspect that this contributes to his RV failure.  He will need followup with pulmonary sleep medicine at discharge.  4. Atrial fibrillation: Paroxysmal.  Currently a-paced, v-sensed. He is on warfarin chronically.  49% atrial fib/flutter burden since 4/16 on interrogation today.  Will add amiodarone 200 mg bid.   Marca Ancona 04/06/2015 12:48 PM

## 2015-04-07 ENCOUNTER — Inpatient Hospital Stay (HOSPITAL_COMMUNITY): Payer: Medicare Other

## 2015-04-07 DIAGNOSIS — R140 Abdominal distension (gaseous): Secondary | ICD-10-CM

## 2015-04-07 DIAGNOSIS — N179 Acute kidney failure, unspecified: Secondary | ICD-10-CM | POA: Insufficient documentation

## 2015-04-07 DIAGNOSIS — N189 Chronic kidney disease, unspecified: Secondary | ICD-10-CM

## 2015-04-07 DIAGNOSIS — I5031 Acute diastolic (congestive) heart failure: Secondary | ICD-10-CM

## 2015-04-07 LAB — URINALYSIS, ROUTINE W REFLEX MICROSCOPIC
BILIRUBIN URINE: NEGATIVE
Glucose, UA: NEGATIVE mg/dL
Hgb urine dipstick: NEGATIVE
KETONES UR: NEGATIVE mg/dL
Leukocytes, UA: NEGATIVE
NITRITE: NEGATIVE
Protein, ur: NEGATIVE mg/dL
Specific Gravity, Urine: 1.009 (ref 1.005–1.030)
UROBILINOGEN UA: 0.2 mg/dL (ref 0.0–1.0)
pH: 5 (ref 5.0–8.0)

## 2015-04-07 LAB — BASIC METABOLIC PANEL
Anion gap: 7 (ref 5–15)
BUN: 33 mg/dL — AB (ref 6–20)
CO2: 28 mmol/L (ref 22–32)
CREATININE: 2.19 mg/dL — AB (ref 0.61–1.24)
Calcium: 8.3 mg/dL — ABNORMAL LOW (ref 8.9–10.3)
Chloride: 101 mmol/L (ref 101–111)
GFR calc Af Amer: 31 mL/min — ABNORMAL LOW (ref >60)
GFR, EST NON AFRICAN AMERICAN: 27 mL/min — AB (ref >60)
Glucose, Bld: 249 mg/dL — ABNORMAL HIGH (ref 65–99)
Potassium: 4.7 mmol/L (ref 3.5–5.1)
SODIUM: 136 mmol/L (ref 135–145)

## 2015-04-07 LAB — URIC ACID: URIC ACID, SERUM: 8.6 mg/dL — AB (ref 4.4–7.6)

## 2015-04-07 LAB — GLUCOSE, CAPILLARY
GLUCOSE-CAPILLARY: 177 mg/dL — AB (ref 65–99)
Glucose-Capillary: 184 mg/dL — ABNORMAL HIGH (ref 65–99)
Glucose-Capillary: 210 mg/dL — ABNORMAL HIGH (ref 65–99)
Glucose-Capillary: 265 mg/dL — ABNORMAL HIGH (ref 65–99)

## 2015-04-07 LAB — CBC
HCT: 31.7 % — ABNORMAL LOW (ref 39.0–52.0)
Hemoglobin: 9.7 g/dL — ABNORMAL LOW (ref 13.0–17.0)
MCH: 28 pg (ref 26.0–34.0)
MCHC: 30.6 g/dL (ref 30.0–36.0)
MCV: 91.6 fL (ref 78.0–100.0)
PLATELETS: 151 10*3/uL (ref 150–400)
RBC: 3.46 MIL/uL — AB (ref 4.22–5.81)
RDW: 13.1 % (ref 11.5–15.5)
WBC: 5.4 10*3/uL (ref 4.0–10.5)

## 2015-04-07 LAB — PROTIME-INR
INR: 2.33 — AB (ref 0.00–1.49)
PROTHROMBIN TIME: 25.3 s — AB (ref 11.6–15.2)

## 2015-04-07 LAB — MAGNESIUM: Magnesium: 2 mg/dL (ref 1.7–2.4)

## 2015-04-07 MED ORDER — POLYETHYLENE GLYCOL 3350 17 G PO PACK: 17.0000 g | PACK | ORAL | Status: DC

## 2015-04-07 MED ORDER — CYCLOSPORINE 0.05 % OP EMUL
1.0000 [drp] | Freq: Two times a day (BID) | OPHTHALMIC | Status: DC
  Administered 2015-04-07 – 2015-04-21 (×28): 1 [drp] via OPHTHALMIC
  Filled 2015-04-07 (×32): qty 1

## 2015-04-07 MED ORDER — POLYETHYLENE GLYCOL 3350 17 G PO PACK
17.0000 g | PACK | Freq: Two times a day (BID) | ORAL | Status: DC
  Administered 2015-04-07 – 2015-04-14 (×14): 17 g via ORAL
  Filled 2015-04-07 (×13): qty 1

## 2015-04-07 MED ORDER — SORBITOL 70 % SOLN
960.0000 mL | TOPICAL_OIL | Freq: Once | ORAL | Status: AC
  Administered 2015-04-07: 960 mL via RECTAL
  Filled 2015-04-07: qty 240

## 2015-04-07 MED ORDER — ACETAMINOPHEN 325 MG PO TABS
650.0000 mg | ORAL_TABLET | Freq: Four times a day (QID) | ORAL | Status: DC | PRN
  Administered 2015-04-08 – 2015-04-13 (×3): 650 mg via ORAL
  Filled 2015-04-07 (×3): qty 2

## 2015-04-07 NOTE — Progress Notes (Signed)
Family Medicine Teaching Service Daily Progress Note Intern Pager: [phone]  Patient name: Tyler [name] Medical record number: [id number] Date of birth: 12-Nov-1933 Age: 81 y.o. Gender: male  Primary Care Provider: [name], MD Consultants: Cardiology Code Status: FULL  Pt Overview and Major Events to Date:  9/12: Admitted for shortness of breath concerning for CHF exacerbation, Wt 291 9/13: Continued on Lasix drip. Wt 280lbs. Amiodarone started for PAF.   Assessment and Plan:  Tyler [name] is a 81 y.o. male presenting with recurrent hypoxemic respiratory failure from acute CHF exacerbation. PMH is significant for HFpEF, HTN, HLD, T2DM, OSA, OHS, SSS and PAF s/p dual chamber pacer 2013.  Acute on Chronic Diastolic HF, HFpEF: Echo on most recent admission with EF 55-60%, G2DD. Pro-BNP elevated to 317. Discharge weight 279 on 8/24 (?DW). This admission, weight 291>275>284>280 (9/14). Troponins negative x2 on admission. CXR this admission with stable cardiomegaly. Patient continues to be notably edematous throughout his body. - Heart Failure team following, appreciate recommendations - Per Heart Failure team, continue Lasix gtt @ 10 mg/hr today - Daily weights, strict I/O, fluid restriction - Holding beta blocker given low suspicion for ACS and acute CHF exacerbation - Holding statin due to erythromycin interaction  Acute hypoxemic respiratory failure: Likely multifactorial with complicated lung history of asthma, OHS, OSA, and 20 pack/year smoking history with likely undiagnosed COPD. As above, patient also with current CHF exacerbation that is likely impairing his already compromised respiratory status. Protuberant abdomen also an exacerbating factor.  - Continue diuresis as above - Will discuss need for CPAP with patient - Wean O2 as tolerated, current 2L O2 requirement - Continue allergy/reactive airway therapy: flonase, loratadine, singulair, formulary LABA/ICS, prn  albuterol  Hypertension: Normotensive overnight; no hydralazine requirement. - Hydralazine 10mg  IV q2h prn SBP > 170  Arrhythmias: Sick Sinus, Paroxysmal AFib/Flutter: Patient is on therapeutic anticoagulation on coumadin s/p dual chamber placement, with interrogation this admission demonstrative of 49% atrial fibrillation burden since 11/02/14. - Per Heart Failure team, amiodarone 200 mg BID started (9/13) - Anticoagulation per pharmacy, PT-INR mildly prolonged today  Hyperkalemia: Resolved, K 4.7 9/14. - Lasix as above - Follow BMP  Stage III CKD: Scr 2.19<<2.09<<.98 increasing in setting of aggressive diuresis.  - Continue to monitor closely given need to diuresis in setting of CHF - Milk of Magnesia outpatient prn constipation treatment should be discontinued at discharge due to SCr persistently >2mg /dl.  Type II Diabetes Mellitus, Insulin-Dependent: Well, possibly too tightly, controlled - Hb A1c 6.6% on 9/8. Though longstanding sequelae likely include nephropathy, neuropathy, and gastroparesis (Dx w/gastroscintigraphy 2006). EMR states lantus dose is 27u qAM and 45u qPM and novolog SSI.  - Decrease home insulin to lantus 35u qAM - Resistant SSI qAC/HS - Consider d/c on this dose - Continue neurontin 100mg  TID  Abdominal Distention: Present for >1 year at baseline, progressive. Negative abdominal US last admission. Prior to admission, daily soft bowel movements with daily Miralax; however, no BM yet this admission. KUB notable for possible adynamic ileus. Per daughter, this is chronic in nature with treatment dating back to early 2000s; previously treated with Reglan, but discontinued due to dyskinesia and patient transitioned to low dose Reglan. - Continue IV Erythromycin - Continue PPI - Continue miralax  Chronic Pain: - Hold narcotics as can be contributing to constipation - Discontinue home mobic given CHF - Tylenol PRN pain   Social: Pt is being treated for cognitive  impairment. Mr. Milles daughter, [name], requests daily updates to  which the patient has consented verbally.She had been told that definitive plans for the day can be relayed to her around 1:45 - 2:00pm daily after rounds and noon conference. - I have updated her in detail about the above plans. - Continue donepezil  History of recurrent Klebsiella UTI due to Bladder outlet obstruction from BPH: No evidence of infection at this time with good UOP  - Low threshold for repeat U/A and bladder scan if symptoms recur or urinary retention suspected. Consider further urologic investigation as outpatient.  - Continue myrbetriq;flomax, hold cardura as redundant therapy - Hold elavil for concern of worsening retention  Chronic RLE wound: Followed at wound care clinic as outpatient.  - WOC recommendations appreciated.   FEN/GI: Heart healthy/carb modified diet, saline lock IV Prophylaxis: Therapeutic coumadin  Disposition: Admit on tele for gentle diuresis. Consult CSW for likely dispo back to ALF.  Subjective:  Patient endorses continued shortness of breath this morning, that is only mildly improved from yesterday. Denies difficulty urinating. Reports most recent BM 3 days ago; previously on Miralax daily. Also reports difficulty ambulating due to whole body swelling. Denies chest pain, palpitations.  Objective: Temp:  [97.8 F (36.6 C)-98.3 F (36.8 C)] 98.3 F (36.8 C) (09/14 0458) Pulse Rate:  [59-60] 60 (09/14 0458) Resp:  [18-22] 18 (09/14 0458) BP: (127-145)/(50-61) 137/50 mmHg (09/14 0458) SpO2:  [100 %] 100 % (09/14 0842) Weight:  [127.279 kg (280 lb 9.6 oz)-129 kg (284 lb 6.3 oz)] 127.279 kg (280 lb 9.6 oz) (09/14 0500)  Physical Exam: General: Patient resting, in no acute distress. Appears uncomfortable. HEENT: Patient speaking through clenched jaw; reports 2/2 facial swelling. Cardiovascular: RRR without murmur/rub/gallop. JVD difficult to appreciate. Respiratory: Normal work  of breathing on 6L O2 by [place]. Faint expiratory wheeze appreciated in the RML. Absent breath sounds in the bases bilaterally; no crackles or rhonchi appreciated. Abdomen: Markedly protuberant without tenderness. Normoactive bowel sounds present, high-pitched and distant with echo. Extremities: Warm and well-perfused. 3+ pitting edema in the lower extremities bilaterally to the hip. 1+ bilateral dependent edema. Upper extremities with non-pitting 1+ edema bilaterally. Radial pulses 2+ bilaterally.   Intake/Output Summary (Last 24 hours) at 04/07/15 0953 Last data filed at 04/07/15 0941  Gross per 24 hour  Intake 1234.33 ml  Output   4000 ml  Net -2765.67 ml   Patient also with single large incontinent episode that was unmeasured.  Laboratory:  Recent Labs Lab 04/05/15 1537 04/07/15 0325  WBC 8.1 5.4  HGB 10.7* 9.7*  HCT 33.8* 31.7*  PLT 174 151    Recent Labs Lab 04/05/15 1537 04/06/15 0315 04/06/15 1012 04/07/15 0325  NA 140 141 141 136  K 5.2* 4.8 5.3* 4.7  CL 107 108 108 101  CO2 27 27 28 28   BUN 34* 29* 31* 33*  CREATININE 1.98* 2.08* 2.11* 2.19*  CALCIUM 8.6* 8.5* 8.5* 8.3*  PROT 7.8  --   --   --   BILITOT 0.6  --   --   --   ALKPHOS 105  --   --   --   ALT 17  --   --   --   AST 19  --   --   --   GLUCOSE 67 115* 111* 249*    PT: 25.3 INR: 2.33  Uric Acid: 8.6  Magnesium: Pending  Imaging/Diagnostic Tests: Dg Chest 2 View  04/05/2015   CLINICAL DATA:  Shortness of Breath  EXAM: CHEST  2 VIEW  COMPARISON:  March 15, 2015  FINDINGS: There is no edema or consolidation. There is a persistent small left pleural effusion. Heart is borderline enlarged with pulmonary vascularity within normal limits. Pacemaker leads are attached to the right atrium and right ventricle. No adenopathy. No pneumothorax.  IMPRESSION: Persistent cardiomegaly and small left pleural effusion. No frank edema or consolidation. No appreciable change compared to recent prior study.    Electronically Signed   By: Bretta Bang III M.D.   On: 04/05/2015 16:21   Abd 1 View (kub)  04/06/2015   CLINICAL DATA:  Abdominal distention.  EXAM: ABDOMEN - 1 VIEW  COMPARISON:  06/07/2012.  FINDINGS: Cardiac pacer with lead tips in right atrium right ventricle. Air-filled loops of small and large bowel are noted. Minimal distention. A mild adynamic ileus cannot be excluded. No free air.  IMPRESSION: Cannot exclude mild adynamic ileus.  Exam otherwise unremarkable.   Electronically Signed   By: Maisie Fus  Register   On: 04/06/2015 09:25    Melford Aase, Med Student 04/07/2015, 8:45 AM MS4, [facility] FPTS Intern pager: [phone], text pages welcome  RESIDENT ADDENDUM  I have separately seen and examined the patient. I have discussed the findings and exam with the medical student, helped develop the management plan that is described in the student's note, and I agree with the content.  Additionally I have outlined my exam and assessment/plan below:   S: Still short of breath and abdomen "tight." Only marginally if any better. No family at bedside this morning.   O:  Pleasant elderly man sleeping Cardiovascular: Regular, distant S1S2 without murmur, rub or gallop. JVD difficult to determine.  Respiratory: Nonlabored on 2L O2 by [place]. Decreased throughout with bibasilar crackles. Abdomen: Protuberant without tenderness or fluid wave. + bowel sounds.  Ext: 1+ Bilateral dependent edema. LE's glossy without hair, DP pulses 1+ bilaterally Skin: Venous stasis changes bilateral LEs, RLE wound wrapped in ACE bandage and foam dressing not examined.  A/P:  81 y.o. male with dyspnea, acute hypoxemic respiratory failure due to acute on chronic heart failure (HFpEF) with underlying pulmonary disease.  - Effective diuresis underway now with lasix gtt, HF team input appreciated. Will watch renal function and electrolytes closely. - Monitoring daily weight, EDW likely below D/C wt of  279lbs on 8/24. Now 280lbs. with clinical evidence of overload. - Oxygen requirement improving, now 100% on 2L, though OHS, and/or abdominal distention, OSA, and previously undiagnosed COPD likely contributing. Recommend outpatient sleep study. We've continued flonase, loratadine, singulair, formulary LABA/ICS, prn albuterol  Paroxysmal AFib: on coumadin with CHADSVASC at least 5 and pacer with interrogation showing significant AFib burden.  - Cardiology has started amiodarone.   Chronic and worsening abdominal distention: U/S without ascites last admission, KUB with dilatation of bowel loops without free air.  - On erythromycin IV (dyskinesia with reglan in past), holding anticholinergics - 3 days since LBM, continuing miralax. Will consider suppository today.   Avia Merkley B. Jarvis Newcomer, MD, PGY-3 04/07/2015 10:11 AM

## 2015-04-07 NOTE — Progress Notes (Addendum)
Inpatient Diabetes Program Recommendations  AACE/ADA: New Consensus Statement on Inpatient Glycemic Control (2015)  Target Ranges:  Prepandial:   less than 140 mg/dL      Peak postprandial:   less than 180 mg/dL (1-2 hours)      Critically ill patients:  140 - 180 mg/dL   Results for ORONDE, HALLENBECK (MRN [id number]) as of 04/07/2015 10:40  Ref. Range 04/06/2015 06:44 04/06/2015 12:05 04/06/2015 16:47 04/06/2015 21:04 04/07/2015 06:05  Glucose-Capillary Latest Ref Range: 65-99 mg/dL 79 132 (H) 440 (H) 102 (H) 184 (H)   Review of Glycemic Control: Hyperglycemia around meals  Diabetes history: DM 2 Outpatient Diabetes medications: Lantus 27 units QAM, 45 units QPM, Novolog 05-03-11 TID meal coverage Current orders for Inpatient glycemic control: Lantus 35 units QHS, Novolog Resistant scale TID  Inpatient Diabetes Program Recommendations:  Insulin - Meal Coverage: Patient takes meal coverage at home (05-03-11 units). Glucose increased into the 200's at meal times. Please consider ordering Novolog 5 units TID with meals for meal coverage in addition to correction scale.  Thanks,  Christena Deem RN, MSN, [facility] Inpatient Diabetes Coordinator Team Pager [phone]

## 2015-04-07 NOTE — Progress Notes (Signed)
Advanced Heart Failure Rounding Note  Primary Physician: Dr. Cyndia Bent Primary Cardiologist: Dr [name] HF: Tyler [name] Tyler [name])  Subjective:    Out 1.9 L net and down 4 lb on Lasix gtt 10 mg/hr. Cr relatively stable.  Still feels "tight."    Objective:   Weight Range: 280 lb 9.6 oz (127.279 kg) Body mass index is 40.26 kg/(m^2).   Vital Signs:   Temp:  [97.8 F (36.6 C)-98.3 F (36.8 C)] 98.3 F (36.8 C) (09/14 0458) Pulse Rate:  [59-60] 60 (09/14 0458) Resp:  [18-22] 18 (09/14 0458) BP: (127-145)/(50-61) 137/50 mmHg (09/14 0458) SpO2:  [100 %] 100 % (09/14 0458) Weight:  [280 lb 9.6 oz (127.279 kg)-284 lb 6.3 oz (129 kg)] 280 lb 9.6 oz (127.279 kg) (09/14 0500) Last BM Date: 04/05/15  Weight change: Filed Weights   04/06/15 0600 04/06/15 1139 04/07/15 0500  Weight: 275 lb 1.6 oz (124.785 kg) 284 lb 6.3 oz (129 kg) 280 lb 9.6 oz (127.279 kg)    Intake/Output:   Intake/Output Summary (Last 24 hours) at 04/07/15 0804 Last data filed at 04/07/15 0600  Gross per 24 hour  Intake 1474.33 ml  Output   3450 ml  Net -1975.67 ml     Physical Exam: General: Obese, Chronically ill appearing. SOB with sentences. HEENT: normal Neck: supple. Thick, JVP 12-14 cm. Carotids 2+ bilat; no bruits. No lymphadenopathy or thryomegaly appreciated. Cor: PMI nondisplaced. Regular rate & rhythm. No M/G/R appreciated Lungs: Diminished throughout. Crackles bibasilarly.  Abdomen: Obese, tight, nontender, Markedly distended. No hepatosplenomegaly. No bruits or masses. Good bowel sounds. Extremities: no cyanosis, clubbing, rash. 2-3+ edema up into thighs bilaterally. Covered wound on R LE. Followed outpatient. Neuro: alert & orientedx3, cranial nerves grossly intact. moves all 4 extremities w/o difficulty. Affect pleasant  Telemetry:  A-paced, v-sensed 60s.  Short runs NSVT (3-6 beats).   Labs: CBC  Recent Labs  04/05/15 1537 04/07/15 0325  WBC 8.1 5.4  NEUTROABS 6.2  --   HGB 10.7* 9.7*   HCT 33.8* 31.7*  MCV 92.1 91.6  PLT 174 151   Basic Metabolic Panel  Recent Labs  04/06/15 1012 04/07/15 0325  NA 141 136  K 5.3* 4.7  CL 108 101  CO2 28 28  GLUCOSE 111* 249*  BUN 31* 33*  CALCIUM 8.5* 8.3*   Liver Function Tests  Recent Labs  04/05/15 1537  AST 19  ALT 17  ALKPHOS 105  BILITOT 0.6  PROT 7.8  ALBUMIN 3.7   No results for input(s): LIPASE, AMYLASE in the last 72 hours. Cardiac Enzymes  Recent Labs  04/05/15 1537 04/06/15 0315  TROPONINI <0.03 0.03    BNP: BNP (last 3 results)  Recent Labs  03/15/15 1820 04/05/15 1537  BNP 195.8* 317.1*    ProBNP (last 3 results) No results for input(s): PROBNP in the last 8760 hours.   D-Dimer  Recent Labs  04/05/15 1537  DDIMER <0.27   Hemoglobin A1C No results for input(s): HGBA1C in the last 72 hours. Fasting Lipid Panel No results for input(s): CHOL, HDL, LDLCALC, TRIG, CHOLHDL, LDLDIRECT in the last 72 hours. Thyroid Function Tests No results for input(s): TSH, T4TOTAL, T3FREE, THYROIDAB in the last 72 hours.  Invalid input(s): FREET3  Imaging/Studies:  Dg Chest 2 View  04/05/2015   CLINICAL DATA:  Shortness of Breath  EXAM: CHEST  2 VIEW  COMPARISON:  March 15, 2015  FINDINGS: There is no edema or consolidation. There is a persistent small left pleural effusion. Heart is  borderline enlarged with pulmonary vascularity within normal limits. Pacemaker leads are attached to the right atrium and right ventricle. No adenopathy. No pneumothorax.  IMPRESSION: Persistent cardiomegaly and small left pleural effusion. No frank edema or consolidation. No appreciable change compared to recent prior study.   Electronically Signed   By: Bretta Bang III M.D.   On: 04/05/2015 16:21   Abd 1 View (kub)  04/06/2015   CLINICAL DATA:  Abdominal distention.  EXAM: ABDOMEN - 1 VIEW  COMPARISON:  06/07/2012.  FINDINGS: Cardiac pacer with lead tips in right atrium right ventricle. Air-filled loops of  small and large bowel are noted. Minimal distention. A mild adynamic ileus cannot be excluded. No free air.  IMPRESSION: Cannot exclude mild adynamic ileus.  Exam otherwise unremarkable.   Electronically Signed   By: Maisie Fus  Register   On: 04/06/2015 09:25     Latest Echo  Latest Cath   Medications:     Scheduled Medications: . amiodarone  200 mg Oral BID  . donepezil  10 mg Oral QHS  . erythromycin  250 mg Intravenous 3 times per day  . gabapentin  100 mg Oral TID  . insulin aspart  0-20 Units Subcutaneous TID WC  . insulin glargine  35 Units Subcutaneous QHS  . loratadine  10 mg Oral Daily  . mirabegron ER  50 mg Oral Daily  . mometasone-formoterol  2 puff Inhalation BID  . montelukast  10 mg Oral QHS  . mupirocin cream  1 application Topical Daily  . pantoprazole  40 mg Oral Daily  . polyethylene glycol  17 g Oral Q M,W,F  . sertraline  25 mg Oral Daily  . sodium chloride  3 mL Intravenous Q12H  . sodium chloride  3 mL Intravenous Q12H  . tamsulosin  0.4 mg Oral Daily  . warfarin  7.5 mg Oral q1800  . Warfarin - Pharmacist Dosing Inpatient   Does not apply q1800     Infusions: . furosemide (LASIX) infusion 10 mg/hr (04/06/15 1330)     PRN Medications:  sodium chloride, albuterol, hydrALAZINE, HYDROcodone-acetaminophen, sodium chloride   Assessment/Plan   Tyler [name] is a 81 y.o. male with history of Chronic diastolic CHF, Echo 03/16/15 Echo EF 55-60%, Grade 2 DD, mildly dilated RV, hx of sick sinus syndrome s/p STJ dual chamber PCM 2013, paroxysmal a fib stable on coumadin, OHS/OSA non-compliant with CPAP, HTN, and DM who presented with worsening SOB, orthopnea, and edema.  1. Acute on chronic diastolic HF, EF 55-60%, grade 2 DD, mildly dilated RV. s/p STJ dual chamber PPM 2013 - Remains volume overloaded with 2+ LE edema - Weight today 280 standing. Recent weights in mid 270s. - Continue lasix gtt @ 10 mg/hr for today.   - Continue daily weights, strict  I/O, and fluid restriction 2. Shortness of Breath - Multifactorial with complicated lung history of Asthma, OHS, OSA, and likely COPD component with 20 pack year history ( Quit in 68s). - Will discuss optimal evaluation and rx with MD. Diuresis will likely help. - Will further discuss need for CPAP with patient. 3. Paroxysmal a fib - CHA2DS2VASC score at least 5 (7.2% risk of stroke per year) - On chronic coumadin - hx of cardioversion - NSR currently.  - Interrogation showed 49% afib burden since 11/02/14 - Started on amio 200 mg BID 9/13 4. AKI on CKD Stage III-IV - Will monitor closely with diuresis - Relatively stable thus far. 2.08 -> 2.11 -> 2.19 5. Hyperkalemia -  Improved today. 5.3 -> 4.7 6. HTN 7. OSA/OHS - Will follow up with pulmonary sleep medicine at discharge. 8. Hx of sick sinus syndrome/symptomatic bradycardia - s/p STJ dual chamber PPM 2013 as above. 9. DM  Length of Stay: 2  Tyler Freer PA-C 04/07/2015, 8:04 AM  Advanced Heart Failure Team Pager [phone] (M-F; 7a - 4p)  Please contact [facility] for night-coverage after hours (4p -7a ) and weekends on [email or web address]  Patient seen with PA, agree with the above note.  1. Acute on chronic diastolic CHF: I suspect that he has a significant component of RV failure as well in the setting of untreated OSA, ?OHS. He is volume overloaded on exam and has gained a lot of weight recently. He was in the hospital last month also with CHF but suspect that he was not fully diuresed before going home. He is doing well so far on Lasix gtt, weight falling.  - Continue Lasix gtt 10 mg/hr today.  2. CKD: Baseline creatinine around 2. Will need to follow closely with diuresis, creatinine may improve as we lower renal venous pressure. Today, it is relatively stable.  3. OSA, ?OHS: Untreated OSA, question OHS. I suspect that this contributes to his RV failure. He will need followup with pulmonary sleep medicine at  discharge.  4. Atrial fibrillation: Paroxysmal. Currently a-paced, v-sensed. He is on warfarin chronically. 49% atrial fib/flutter burden since 4/16 on interrogation this admission. Added amiodarone 200 mg bid.   Tyler [name] 04/07/2015 8:25 AM

## 2015-04-07 NOTE — Progress Notes (Signed)
Patient states his eyes are really dry and are burning. Text-paged on-call hospitalist of patient requesting Restasis in which daughter states he takes this medication at home once in the morning and at night. Awaiting orders/page. Will continue to monitor patient to end of shift.

## 2015-04-07 NOTE — Clinical Social Work Note (Signed)
Clinical Social Work Assessment  Patient Details  Name: Tyler [name] MRN: [id number] Date of Birth: 12-12-1933  Date of referral:  04/07/15               Reason for consult:  Facility Placement                Permission sought to share information with:  Facility Industrial/product designer granted to share information::  Yes, Verbal Permission Granted  Name::        Agency::   ([facility])  Relationship::     Contact Information:     Housing/Transportation Living arrangements for the past 2 months:  Assisted Dealer of Information:  Adult Children Patient Interpreter Needed:  None Criminal Activity/Legal Involvement Pertinent to Current Situation/Hospitalization:  No - Comment as needed Significant Relationships:  Adult Children Lives with:  Facility Resident Do you feel safe going back to the place where you live?  Yes Need for family participation in patient care:  Yes (Comment)  Care giving concerns:  Pt admitted from facility and pt daughter requesting to speak with CSW   Social Worker assessment / plan:  CSW spoke with pt daughter [name]. She assists with pt medical care because of pt memory impairment. CSW confirmed pt has been at [facility] for close to 5 years. Pt daughter extremely pleased with facility and wanting pt to return at dc. Pt with recent admission so pt daughter is aware of process CSW will need to go through to confirm dc with facility. Pt daughter did want to request that CSW notify facility early of discharge and any medication changes because facility will decline to accept pt back if they do not have time to get new medications.  Employment status:  Retired Health and safety inspector:  Medicare PT Recommendations:  Not assessed at this time Information / Referral to community resources:   (None needed at this time)  Patient/Family's Response to care:  Pt daughter agreeable to plan of care  Patient/Family's Understanding of and  Emotional Response to Diagnosis, Current Treatment, and Prognosis:  Pt daughter with good understanding of pt condition. She was calm and hopeful and with appropriate emotional response.  Emotional Assessment Appearance:  Well-Groomed Attitude/Demeanor/Rapport:  Unable to Assess Affect (typically observed):  Unable to Assess Orientation:  Oriented to Self, Oriented to Place, Oriented to  Time, Oriented to Situation Alcohol / Substance use:  Not Applicable Psych involvement (Current and /or in the community):  No (Comment)  Discharge Needs  Concerns to be addressed:  Discharge Planning Concerns Readmission within the last 30 days:  Yes Current discharge risk:  Chronically ill Barriers to Discharge:  Continued Medical Work up   [employer], LCSW [phone]

## 2015-04-07 NOTE — Progress Notes (Signed)
FPTS Interim Progress Note  S: Patient with increased abdominal discomfort and no BM in 3 days. He now reports increased distension and that he has not passed gas during the day today.  O: BP 157/38 mmHg  Pulse 61  Temp(Src) 98.7 F (37.1 C) (Oral)  Resp 20  Ht  (1.778 m)  Wt 127.279 kg (280 lb 9.6 oz)  BMI 40.26 kg/m2  SpO2 100%   Abdominal: Abdomen tympanic and increasingly distended from previous exam. Bowel sounds present, high-pitched. Rectal: No hemorrhoids or external lesions visualized. No palpable fissures, hemorrhoids, or masses within the rectum to the level of the dentate line. No impaction. Prostate without enlargement, palpable masses, or irregularity.  A/P: Abdominal Distension: Likely adynamic ileus secondary to longstanding GI immotility exacerbated by use of anticholinergic medication. Spoke with daughter [name]), who reports a previous month-long hospitalization for ileus during which he required tubes "from both the top and bottom." Also reports that his abdomen becomes more distended when he is immobilized and requests frequent ambulation. Contacted surgery, who felt that this patient with likely adynamic ileus was a more appropriate GI consult. Spoke with Gastroenterology, who will see him today and determine need for decompression or surgical evaluation. Made patient NPO for now.  Melford Aase, Med Student 04/07/2015, 3:26 PM MS4, [facility] of Medicine Service pager [phone]

## 2015-04-07 NOTE — Plan of Care (Signed)
Problem: Phase I Progression Outcomes Goal: EF % per last Echo/documented,Core Reminder form on chart Outcome: Completed/Met Date Met:  04/07/15 Performed In August 2016 EF result 55-60% Goal: Voiding-avoid urinary catheter unless indicated Outcome: Completed/Met Date Met:  04/07/15 Condom cath

## 2015-04-07 NOTE — Progress Notes (Signed)
ANTICOAGULATION CONSULT NOTE - Follow Up Consult  Pharmacy Consult for Coumadin Indication: atrial fibrillation  Allergies  Allergen Reactions  . Neosporin Original [Bacitracin-Neomycin-Polymyxin]     Rinaldo Cloud McWhite from [facility] [phone] states pt is allergic to Neosporin and Polysporin ointments.  . Reglan [Metoclopramide]     Tardive Dyskinenia    Patient Measurements: Height:  (177.8 cm) Weight: 280 lb 9.6 oz (127.279 kg) IBW/kg (Calculated) : 73 Heparin Dosing Weight:   Vital Signs: Temp: 98 F (36.7 C) (09/14 0935) Temp Source: Oral (09/14 0935) BP: 158/56 mmHg (09/14 0935) Pulse Rate: 64 (09/14 0935)  Labs:  Recent Labs  04/05/15 1537 04/06/15 0315 04/06/15 1012 04/07/15 0325  HGB 10.7*  --   --  9.7*  HCT 33.8*  --   --  31.7*  PLT 174  --   --  151  LABPROT 24.9* 24.7*  --  25.3*  INR 2.27* 2.26*  --  2.33*  CREATININE 1.98* 2.08* 2.11* 2.19*  TROPONINI <0.03 0.03  --   --     Estimated Creatinine Clearance: 36.6 mL/min (by C-G formula based on Cr of 2.19).   Medications:  Scheduled:  . amiodarone  200 mg Oral BID  . donepezil  10 mg Oral QHS  . erythromycin  250 mg Intravenous 3 times per day  . gabapentin  100 mg Oral TID  . insulin aspart  0-20 Units Subcutaneous TID WC  . insulin glargine  35 Units Subcutaneous QHS  . loratadine  10 mg Oral Daily  . mirabegron ER  50 mg Oral Daily  . mometasone-formoterol  2 puff Inhalation BID  . montelukast  10 mg Oral QHS  . mupirocin cream  1 application Topical Daily  . pantoprazole  40 mg Oral Daily  . polyethylene glycol  17 g Oral Q M,W,F  . sertraline  25 mg Oral Daily  . sodium chloride  3 mL Intravenous Q12H  . sodium chloride  3 mL Intravenous Q12H  . tamsulosin  0.4 mg Oral Daily  . warfarin  7.5 mg Oral q1800  . Warfarin - Pharmacist Dosing Inpatient   Does not apply q1800    Assessment: 81yo male with AFib, INR therapeutic/stable on home dose of Coumadin.  Pt is on Amiodarone  (new) and Erythromycin (added for gastroparesis), both of which may increase INR.  Hg with slight decrease and pltc wnl.  No bleeding noted.    Goal of Therapy:  INR 2-3 Monitor platelets by anticoagulation protocol: Yes   Plan:  Continue Coumadin 7.5mg  po daily Continue daily INR to monitor for potential drug interactions Watch for s/s of bleeding  Marisue Humble, PharmD Clinical Pharmacist [facility]

## 2015-04-07 NOTE — Consult Note (Signed)
[facility] Consult: 3:01 PM 04/07/2015  LOS: 2 days    Referring Provider: Dr Clarene Duke  Primary Care Physician:  [name], MD Primary Gastroenterologist:  Dr. Christella Hartigan     Reason for Consultation:  Ileus.    HPI: Tyler [name] is a morbidly obese 81 y.o. male.  Hx Chronic diastolic dysfunction, PAF, s/p DCCV, chronic Coumadin, hx sick sinus syndrome, s/p pacemaker 2013, hypertension, IDDM, OSA/OHS (noncompliant w/ CPAP), globus sensation with negative work up, dysphagia, constipation, gastroparesis confirmed on GES in 2006.   Globus sensation with extensive unrevealing workup 2012.  Pt has chronic abdominal distention/ileus/Ogilvie's syndrome dating to at least 2005.  Has had admissions related to ileus in past.    Admission 8/22-8/24/16 with CHF/volume overload, UTI, urinary retention.   Admitted  2 days ago with recurrent acute flare CHF. His abdomen is persistently distended, and has been like this for many months.  However in the past couple of days abdomen is more tense.  Normally he has a bowel movement daily when he is taking his once daily Miralax.  However Miralax had been held sine admission and was only restarted at every other day dosing today. He denies abdominal pain, and he just feels constipated. No nausea. Up through lunch today he is tolerating solids.  KUB 9/13 "air-filled loops of small and large bowel..... Cannot exclude mild, adynamic ileus"    Meds used this admit include 3 times a day IV erythromycin As of yesterday.  Initially they were treating him with Reglan 10 mg by mouth 3 times a day. Yesterday the dose was decreased to 5 mg only at bedtime. But it was discontinued last night.   Has outpt hydrocodone on med list, no narcotics as inpt.  His daughter says that when he  used to live in [place] several years ago, that his GI doctor had resorted to milk of molasses enemas which worked very well for the patient. She also says that his Reglan dose was minimized because he was developing tardive dyskinesia symptoms. So far, As an outpatient, the smaller dose at at bedtime does not seem to be causing problems for him. Patient is wheelchair bound now for the last 6-8 months. Prior to that he was ambulating in a wheelchair. During this hospitalization he has been getting out of bed and into the lounge chair at bedside for several hours per day.  Past Medical History  Diagnosis Date  . Diabetes mellitus    . Hyperlipidemia    . Hypertension    . Asthma   . Allergic rhinitis   . GERD (gastroesophageal reflux disease)   . BPH (benign prostatic hypertrophy)     s/p thermo therapy, has bladdero utlet obst. and instablitiy, incontient  . Anemia   . Chronic constipation   . Sick sinus syndrome     a. s/p STJ dual chamber pacemaker  . Paroxysmal atrial fibrillation     a. status post direct cardioversion b. anticoagulated with Warfarin  . Obesity   . Sleep apnea     noncomplicance w/ CPAP  .  Gastroparesis     dx in 2006 through gastric scan  . Gynecomastia     nomral mammograms 11/2008  . Chronic kidney disease   . Globus sensation     2012 EGD, Ba swallow, both negative.  MBSS 2012 negative for dysphagia.   Marland Kitchen Atypical atrial flutter     a. CL b. able to be pace terminated    Past Surgical History  Procedure Laterality Date  . Cataract extraction      bilateral  . Rotator cuff repair      left  . Cardiac catheterization  2003    normal, left ventricular function (done after an abnormal cardiolite)  . Tumor removal      off arms  . Permanent pacemaker insertion N/A 06/07/2012    STJ Assurity dual chamber pacemaker implanted by Dr [name] for SSS    Prior to Admission medications   Medication Sig Start Date End Date Taking? Authorizing Provider   albuterol (VENTOLIN HFA) 108 (90 BASE) MCG/ACT inhaler Inhale 2 puffs into the lungs every 4 (four) hours as needed. For shortness of breath   Yes Historical Provider, MD  amitriptyline (ELAVIL) 25 MG tablet Take 25 mg by mouth at bedtime.   Yes Historical Provider, MD  donepezil (ARICEPT) 10 MG tablet Take 10 mg by mouth at bedtime.     Yes Historical Provider, MD  doxazosin (CARDURA) 2 MG tablet Take 1 tablet (2 mg total) by mouth at bedtime. 06/10/12  Yes Rhetta Mura, MD  ferrous sulfate 325 (65 FE) MG tablet Take 325 mg by mouth daily with breakfast.     Yes Historical Provider, MD  fluticasone (FLONASE) 50 MCG/ACT nasal spray Place 2 sprays into both nostrils daily.   Yes Historical Provider, MD  Fluticasone-Salmeterol (ADVAIR) 100-50 MCG/DOSE AEPB Inhale 1 puff into the lungs 2 (two) times daily. Rinse mouth and spit after each use   Yes Historical Provider, MD  Foot Care Products (PREVALON HEEL PROTECTOR) MISC Apply the boot to the left foot to protect the heel at night and during the day when patient is in his chair 10/30/13  Yes Delories Heinz, DPM  gabapentin (NEURONTIN) 100 MG capsule Take 100 mg by mouth 3 (three) times daily.   Yes Historical Provider, MD  HYDROcodone-acetaminophen (NORCO/VICODIN) 5-325 MG per tablet Take 1 tablet by mouth every 8 (eight) hours as needed for severe pain.    Yes Historical Provider, MD  insulin aspart (NOVOLOG) 100 UNIT/ML injection Inject into the skin 3 (three) times daily before meals. 10 units before breakfast and lunch , 12units before dinner along with sliding scale   Yes Historical Provider, MD  insulin glargine (LANTUS) 100 UNIT/ML injection 27 units in the morning and 45 units at bedtime 06/10/12  Yes Rhetta Mura, MD  loratadine (CLARITIN) 10 MG tablet Take 10 mg by mouth daily.   Yes Historical Provider, MD  metoCLOPramide (REGLAN) 10 MG tablet Take 5 mg by mouth at bedtime.   Yes Historical Provider, MD  metoprolol tartrate  (LOPRESSOR) 25 MG tablet Take 1 tablet (25 mg total) by mouth 2 (two) times daily. 12/28/14  Yes Amber Caryl Bis, NP  mirabegron ER (MYRBETRIQ) 50 MG TB24 tablet Take 50 mg by mouth daily.   Yes Historical Provider, MD  montelukast (SINGULAIR) 10 MG tablet Take 10 mg by mouth at bedtime.    Yes Historical Provider, MD  Multiple Vitamin (DAILY VITE) TABS Take by mouth daily. 08/25/14  Yes Historical Provider, MD  omeprazole (PRILOSEC) 40 MG capsule Take 40 mg by mouth daily.   Yes Historical Provider, MD  polyethylene glycol (MIRALAX / GLYCOLAX) packet Take 17 g by mouth daily. Take on Mon.. Wed. and Fri.   Yes Historical Provider, MD  ranitidine (ZANTAC) 150 MG capsule Take 150 mg by mouth 2 (two) times daily.   Yes Historical Provider, MD  sertraline (ZOLOFT) 25 MG tablet Take 25 mg by mouth daily.     Yes Historical Provider, MD  simvastatin (ZOCOR) 40 MG tablet Take 40 mg by mouth daily at 6 PM.   Yes Historical Provider, MD  tamsulosin (FLOMAX) 0.4 MG CAPS capsule Take 0.4 mg by mouth daily.   Yes Historical Provider, MD  torsemide (DEMADEX) 100 MG tablet Take 50 mg by mouth daily.     Yes Historical Provider, MD  vitamin C (ASCORBIC ACID) 500 MG tablet Take 500 mg by mouth daily. 08/25/14 08/25/15 Yes Historical Provider, MD  Vitamin D, Ergocalciferol, (DRISDOL) 50000 UNITS CAPS Take 50,000 Units by mouth every 7 (seven) days. Fridays   Yes Historical Provider, MD  warfarin (COUMADIN) 7.5 MG tablet Take 7.5 mg by mouth daily. 03/26/15  Yes Historical Provider, MD    Scheduled Meds: . amiodarone  200 mg Oral BID  . donepezil  10 mg Oral QHS  . erythromycin  250 mg Intravenous 3 times per day  . gabapentin  100 mg Oral TID  . insulin aspart  0-20 Units Subcutaneous TID WC  . insulin glargine  35 Units Subcutaneous QHS  . loratadine  10 mg Oral Daily  . mirabegron ER  50 mg Oral Daily  . mometasone-formoterol  2 puff Inhalation BID  . montelukast  10 mg Oral QHS  . mupirocin cream  1 application  Topical Daily  . pantoprazole  40 mg Oral Daily  . polyethylene glycol  17 g Oral Q M,W,F  . polyethylene glycol  17 g Oral Q M,W,F  . sertraline  25 mg Oral Daily  . sodium chloride  3 mL Intravenous Q12H  . sodium chloride  3 mL Intravenous Q12H  . tamsulosin  0.4 mg Oral Daily  . warfarin  7.5 mg Oral q1800  . Warfarin - Pharmacist Dosing Inpatient   Does not apply q1800   Infusions: . furosemide (LASIX) infusion 10 mg/hr (04/06/15 1330)   PRN Meds: sodium chloride, acetaminophen, albuterol, hydrALAZINE, sodium chloride   Allergies as of 04/05/2015 - Review Complete 04/05/2015  Allergen Reaction Noted  . Neosporin original [bacitracin-neomycin-polymyxin]  06/12/2013    Family History  Problem Relation Age of Onset  . Colon cancer Neg Hx   . Prostate cancer Maternal Uncle   . Heart disease Paternal Uncle     x 3, paternal aunt    Social History   Social History  . Marital Status: Widowed    Spouse Name: N/A  . Number of Children: 2  . Years of Education: N/A   Occupational History  . retired    Social History Main Topics  . Smoking status: Former Smoker    Types: Cigarettes    Quit date: 07/25/1971  . Smokeless tobacco: Former Neurosurgeon    Types: Chew  . Alcohol Use: No  . Drug Use: No  . Sexual Activity: Not on file   Other Topics Concern  . Not on file   Social History Narrative   Has lived in facility x aprox 4 years   Moved to [place] 08-2008   Does not have a POA  Does not have a living will    REVIEW OF SYSTEMS: Constitutional:  Patient generally very inactive but does not feel exhausted or weak. ENT:  No nose bleeds Pulm:  His shortness of breath has improved in the last 24 hours. CV:  No palpitations, No chest pain. Chronic lower extremity edema.  GU:  No hematuria, no frequency GI:  Per HPI.  Interestingly the patient says when he wears his dentures that it makes his reflux worse. Heme:  No unusual bleeding or bruising.   Transfusions:   Reviewed the medial record and no transfusions dating back to 2005. Neuro:  No headaches, no peripheral tingling or numbness Derm:  Patient Recentlysuffered a clot to his right lower leg Which has had trouble healing because of his chronic lower extremity edema. Endocrine:  No sweats or chills.  No polyuria or dysuria Immunization:  Did not inquire as to immunizations. I see where he had Pneumovax in 2006 this is the only immunization in the Epic record Travel:  None    PHYSICAL EXAM: Vital signs in last 24 hours: Filed Vitals:   04/07/15 1155  BP: 157/38  Pulse: 61  Temp: 98.7 F (37.1 C)  Resp: 20   Wt Readings from Last 3 Encounters:  04/07/15 280 lb 9.6 oz (127.279 kg)  03/17/15 279 lb 1.6 oz (126.599 kg)  12/28/14 275 lb 6.4 oz (124.921 kg)    General: Obese, comfortable, not acutely old appearing. Head:  Cushingoid, obese facial appearance.  Eyes:  No scleral icterus, no conjunctival pallor. Ears:  Not hard of hearing  Nose:  No congestion or discharge Mouth:  Moist and clear. Dentures in place. Neck:  No mass, no JVD, no TMJ Lungs:  Globally diminished bilaterally. Crackles in both bases Heart: RRR. No MRG. S1/S2. Gynecomastia. Abdomen:  Usually protuberant, tense, distended. Not tender. Bowel sounds are tinkling/tympanitic. Unable to appreciate ventral hernias or organomegaly.   Rectal: Deferred   Musc/Skeltl: No joint contractures. Extremities:  Pitting edema 2-3+ running up into the thighs. An Ace bandage covers the right lower leg. Edema almost looks like a venous insufficiency more than heart failure.  Neurologic:  Patient is oriented 3. He is able to move all 4 limbs. Limbs strength was not tested. Fully alert. No gross neurologic deficits. Skin:  Brawny skin changes in the lower extremities. Tattoos:  None seen   Psych:  Calm, not depressed, affable and cooperative.  Intake/Output from previous day: 09/13 0701 - 09/14 0700 In: 1474.3 [P.O.:840; I.V.:434.3;  IV Piggyback:200] Out: 3450 [Urine:3450] Intake/Output this shift: Total I/O In: 360 [P.O.:360] Out: 850 [Urine:850]  LAB RESULTS:  Recent Labs  04/05/15 1537 04/07/15 0325  WBC 8.1 5.4  HGB 10.7* 9.7*  HCT 33.8* 31.7*  PLT 174 151   BMET Lab Results  Component Value Date   NA 136 04/07/2015   NA 141 04/06/2015   NA 141 04/06/2015   K 4.7 04/07/2015   K 5.3* 04/06/2015   K 4.8 04/06/2015   CL 101 04/07/2015   CL 108 04/06/2015   CL 108 04/06/2015   CO2 28 04/07/2015   CO2 28 04/06/2015   CO2 27 04/06/2015   GLUCOSE 249* 04/07/2015   GLUCOSE 111* 04/06/2015   GLUCOSE 115* 04/06/2015   BUN 33* 04/07/2015   BUN 31* 04/06/2015   BUN 29* 04/06/2015   CREATININE 2.19* 04/07/2015   CREATININE 2.11* 04/06/2015   CREATININE 2.08* 04/06/2015   CALCIUM 8.3* 04/07/2015   CALCIUM 8.5* 04/06/2015   CALCIUM  8.5* 04/06/2015   LFT  Recent Labs  04/05/15 1537  PROT 7.8  ALBUMIN 3.7  AST 19  ALT 17  ALKPHOS 105  BILITOT 0.6   PT/INR Lab Results  Component Value Date   INR 2.33* 04/07/2015   INR 2.26* 04/06/2015   INR 2.27* 04/05/2015   Hepatitis Panel No results for input(s): HEPBSAG, HCVAB, HEPAIGM, HEPBIGM in the last 72 hours. C-Diff No components found for: CDIFF Lipase  No results found for: LIPASE  Drugs of Abuse  No results found for: LABOPIA, COCAINSCRNUR, LABBENZ, AMPHETMU, THCU, LABBARB   RADIOLOGY STUDIES: Dg Chest 2 View  04/07/2015   CLINICAL DATA:  Chest tightness and shortness of breath.  EXAM: CHEST  2 VIEW  COMPARISON:  04/05/2015  FINDINGS: Cardiac silhouette is mildly enlarged. No mediastinal or hilar masses or convincing adenopathy.  Lung volumes are relatively low. Allowing for this, lungs are clear. No pleural effusion or pneumothorax.  Sequential pacemaker is stable and well positioned.  Bony thorax is grossly intact.  IMPRESSION: No active cardiopulmonary disease.   Electronically Signed   By: Amie Portland M.D.   On: 04/07/2015 13:30    Dg Chest 2 View  04/05/2015   CLINICAL DATA:  Shortness of Breath  EXAM: CHEST  2 VIEW  COMPARISON:  March 15, 2015  FINDINGS: There is no edema or consolidation. There is a persistent small left pleural effusion. Heart is borderline enlarged with pulmonary vascularity within normal limits. Pacemaker leads are attached to the right atrium and right ventricle. No adenopathy. No pneumothorax.  IMPRESSION: Persistent cardiomegaly and small left pleural effusion. No frank edema or consolidation. No appreciable change compared to recent prior study.   Electronically Signed   By: Bretta Bang III M.D.   On: 04/05/2015 16:21   Abd 1 View (kub)  04/06/2015   CLINICAL DATA:  Abdominal distention.  EXAM: ABDOMEN - 1 VIEW  COMPARISON:  06/07/2012.  FINDINGS: Cardiac pacer with lead tips in right atrium right ventricle. Air-filled loops of small and large bowel are noted. Minimal distention. A mild adynamic ileus cannot be excluded. No free air.  IMPRESSION: Cannot exclude mild adynamic ileus.  Exam otherwise unremarkable.   Electronically Signed   By: Maisie Fus  Register   On: 04/06/2015 09:25    ENDOSCOPIC STUDIES: 05/2012 contrast enema of colon for diffusely distended abdommen: No evidence of colonic obstruction, focal mucosal lesion or volvulus. The colon is diffusely dilated and elongated.  04/2011 EGD for dysphagia and negative esophagram.  Dr Christella Hartigan Moderate gastritis. Path: chronic inactive gastritis, negative for:  H pylori, dysplasia, metaplasia  04/2011 Modified Barium swallow: functional swallow.  Trace penetration of thins, cleared well with subsequent swallows.  02/2011 Esophagram was normal.   2008 Esophagram with small HH, reflux, mild tertiary contractions, tablet passed.   2006 and 2009 gastric emptying study: 2006 with significant emptying delay.  2009 with normal emptying.   07/2003  Colonoscopy at [facility] Med for constipation.  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  In the setting of having held his daily Miralax and admission with volume overload, he has not had a bowel movement for 3 days. No obstructive symptoms such as nausea or abdominal pain. Mild ileus on 9/13 KUB,   *  Diastolic CHF, admission for flare in 02/2015 and 2 days ago.  Unable to ascertain a weight trend yet but his breathing has improved in the last 24  hours.  *  IDDM  *  Chronic Coumadin.  INR therapeutic at 2.3.  *  Hx gastroparesis. On Reglan at HS as outpt.  Has hx tardive dyskinesia related to Reglan but tolerated low HS dose of this.    *  Overactive bladder, ? BPH: on multiple meds for this.  These may be adding anticholinergic effect on bowel   *  Hx GERD.  On PPI and H2 blocker per outpt med list.     PLAN:     *  Despite his impressive exam, the x-ray is unimpressive. Patient has not been receiving his usual, daily Miralax, though it was restarted today. For now we will order a SMOG enema. We will give him an extra dose of Miralax today and continue BID dosing instead of qod.  Based on the fact that the KUB is nonobstructive, he has no obstructive symptoms, i.e. No nausea or vomiting or belly pain, he does not need an NG tube or colonic decompression. Stop the erythrmysin.    Jennye Moccasin  04/07/2015, 3:01 PM Pager: [phone]     Attending Addendum: I have taken an interval history and examined the patient. I agree with the Advanced Practitioner's note and impression.  Wife reports he has had issues with this in the past when he is hospitalized, usually responds to conservative therapy. He and wife reports abdominal exam not much different than his baseline. He has not been on miralax as he normally has. He is eating well, no vomiting, no obstructive symptoms. Will give him an enema and encourage mobility as he can tolerate. I would hold off on erythromycin at this time, and would hold Elavil  if possible given this is likely worsening things. Electrolytes stable and would replete as needed moving forward. Call if changes in his status or worsening.   Ileene Patrick, MD [facility] Gastroenterology Pager [phone]

## 2015-04-08 DIAGNOSIS — N179 Acute kidney failure, unspecified: Secondary | ICD-10-CM

## 2015-04-08 DIAGNOSIS — N189 Chronic kidney disease, unspecified: Secondary | ICD-10-CM

## 2015-04-08 LAB — GLUCOSE, CAPILLARY
GLUCOSE-CAPILLARY: 402 mg/dL — AB (ref 65–99)
GLUCOSE-CAPILLARY: 351 mg/dL — AB (ref 65–99)
GLUCOSE-CAPILLARY: 303 mg/dL — AB (ref 65–99)
GLUCOSE-CAPILLARY: 227 mg/dL — AB (ref 65–99)
Glucose-Capillary: 184 mg/dL — ABNORMAL HIGH (ref 65–99)

## 2015-04-08 LAB — CBC
HEMATOCRIT: 32.3 % — AB (ref 39.0–52.0)
Hemoglobin: 9.9 g/dL — ABNORMAL LOW (ref 13.0–17.0)
MCH: 28 pg (ref 26.0–34.0)
MCHC: 30.7 g/dL (ref 30.0–36.0)
MCV: 91.2 fL (ref 78.0–100.0)
Platelets: 151 10*3/uL (ref 150–400)
RBC: 3.54 MIL/uL — ABNORMAL LOW (ref 4.22–5.81)
RDW: 12.8 % (ref 11.5–15.5)
WBC: 6.8 10*3/uL (ref 4.0–10.5)

## 2015-04-08 LAB — BASIC METABOLIC PANEL
ANION GAP: 10 (ref 5–15)
BUN: 38 mg/dL — ABNORMAL HIGH (ref 6–20)
CALCIUM: 8.2 mg/dL — AB (ref 8.9–10.3)
CO2: 26 mmol/L (ref 22–32)
Chloride: 97 mmol/L — ABNORMAL LOW (ref 101–111)
Creatinine, Ser: 2.35 mg/dL — ABNORMAL HIGH (ref 0.61–1.24)
GFR, EST AFRICAN AMERICAN: 29 mL/min — AB (ref >60)
GFR, EST NON AFRICAN AMERICAN: 25 mL/min — AB (ref >60)
Glucose, Bld: 388 mg/dL — ABNORMAL HIGH (ref 65–99)
Potassium: 4.8 mmol/L (ref 3.5–5.1)
SODIUM: 133 mmol/L — AB (ref 135–145)

## 2015-04-08 LAB — PROTIME-INR
INR: 2.57 — AB (ref 0.00–1.49)
PROTHROMBIN TIME: 27.2 s — AB (ref 11.6–15.2)

## 2015-04-08 MED ORDER — DIPHENHYDRAMINE-ZINC ACETATE 2-0.1 % EX CREA
TOPICAL_CREAM | Freq: Two times a day (BID) | CUTANEOUS | Status: DC | PRN
  Administered 2015-04-09 – 2015-04-10 (×2): via TOPICAL
  Filled 2015-04-08 (×3): qty 28

## 2015-04-08 MED ORDER — METOLAZONE 5 MG PO TABS
5.0000 mg | ORAL_TABLET | Freq: Once | ORAL | Status: AC
  Administered 2015-04-08: 5 mg via ORAL
  Filled 2015-04-08: qty 1

## 2015-04-08 MED ORDER — IPRATROPIUM-ALBUTEROL 0.5-2.5 (3) MG/3ML IN SOLN: 3.0000 mL | Freq: Four times a day (QID) | RESPIRATORY_TRACT | Status: DC | PRN

## 2015-04-08 MED ORDER — INFLUENZA VAC SPLIT QUAD 0.5 ML IM SUSY
0.5000 mL | PREFILLED_SYRINGE | INTRAMUSCULAR | Status: AC
  Administered 2015-04-09: 0.5 mL via INTRAMUSCULAR
  Filled 2015-04-08: qty 0.5

## 2015-04-08 MED ORDER — INSULIN GLARGINE 100 UNIT/ML ~~LOC~~ SOLN
45.0000 [IU] | Freq: Every day | SUBCUTANEOUS | Status: DC
  Administered 2015-04-08 – 2015-04-14 (×7): 45 [IU] via SUBCUTANEOUS
  Filled 2015-04-08 (×8): qty 0.45

## 2015-04-08 MED ORDER — ATORVASTATIN CALCIUM 20 MG PO TABS: 20.0000 mg | ORAL_TABLET | Freq: Every day | ORAL | Status: DC

## 2015-04-08 MED ORDER — AMLODIPINE BESYLATE 2.5 MG PO TABS
2.5000 mg | ORAL_TABLET | Freq: Every day | ORAL | Status: DC
  Administered 2015-04-08 – 2015-04-20 (×13): 2.5 mg via ORAL
  Filled 2015-04-08 (×13): qty 1

## 2015-04-08 MED ORDER — SIMVASTATIN 40 MG PO TABS: 40.0000 mg | ORAL_TABLET | Freq: Every day | ORAL | Status: DC

## 2015-04-08 MED ORDER — INSULIN ASPART 100 UNIT/ML ~~LOC~~ SOLN
5.0000 [IU] | Freq: Three times a day (TID) | SUBCUTANEOUS | Status: DC
  Administered 2015-04-10 – 2015-04-13 (×11): 5 [IU] via SUBCUTANEOUS

## 2015-04-08 MED ORDER — ATORVASTATIN CALCIUM 40 MG PO TABS
40.0000 mg | ORAL_TABLET | Freq: Every day | ORAL | Status: DC
  Administered 2015-04-08 – 2015-04-20 (×14): 40 mg via ORAL
  Filled 2015-04-08 (×15): qty 1

## 2015-04-08 NOTE — Progress Notes (Signed)
ANTICOAGULATION CONSULT NOTE - Follow Up Consult  Pharmacy Consult for Coumadin Indication: atrial fibrillation  Allergies  Allergen Reactions  . Neosporin Original [Bacitracin-Neomycin-Polymyxin]     Tyler [name] from [facility] [phone] states pt is allergic to Neosporin and Polysporin ointments.  . Reglan [Metoclopramide]     Tardive Dyskinenia    Patient Measurements: Height:  (177.8 cm) Weight: 281 lb 14.4 oz (127.869 kg) (bedscale) IBW/kg (Calculated) : 73 Heparin Dosing Weight:   Vital Signs: Temp: 98.4 F (36.9 C) (09/15 0412) Temp Source: Oral (09/15 0412) BP: 150/84 mmHg (09/15 0952) Pulse Rate: 66 (09/15 0952)  Labs:  Recent Labs  04/05/15 1537 04/06/15 0315 04/06/15 1012 04/07/15 0325 04/08/15 0345  HGB 10.7*  --   --  9.7* 9.9*  HCT 33.8*  --   --  31.7* 32.3*  PLT 174  --   --  151 151  LABPROT 24.9* 24.7*  --  25.3* 27.2*  INR 2.27* 2.26*  --  2.33* 2.57*  CREATININE 1.98* 2.08* 2.11* 2.19* 2.35*  TROPONINI <0.03 0.03  --   --   --     Estimated Creatinine Clearance: 34.2 mL/min (by C-G formula based on Cr of 2.35).  Assessment: 81yo male with AFib, INR therapeutic/stable on home dose of Coumadin.  Pt is on Amiodarone (new) and Erythromycin (added for gastroparesis), both of which may increase INR.  Hg with slight increase and pltc wnl.  No bleeding noted.    Will continue home dose of warfarin 7.5mg  daily and follow INR closely for drug interactions.  Goal of Therapy:  INR 2-3 Monitor platelets by anticoagulation protocol: Yes   Plan:  Continue Coumadin 7.5mg  po daily Continue daily INR to monitor for potential drug interactions Watch for s/s of bleeding  Sheppard Coil PharmD., BCPS Clinical Pharmacist Pager [phone] 04/08/2015 11:04 AM

## 2015-04-08 NOTE — Care Management Important Message (Signed)
Important Message  Patient Details  Name: GRIER VUton MRN: [id number] Date of Birth: Sep 26, 1933   Medicare Important Message Given:  Yes-second notification given    Orson Aloe 04/08/2015, 8:41 AM

## 2015-04-08 NOTE — Progress Notes (Signed)
Smog enema given to patient. Patient tolerated enema well. Patient had large bowel movement. Abdomen is not so tight and is more mushy when palpated but abdomen is still distended. Not sure if patient needs another Smog enema or maybe it just may need more time to work. Patient was changed and cleaned. Currently patient has no pain or discomfort at this time. Will continue to monitor patient to end of shift.

## 2015-04-08 NOTE — Progress Notes (Addendum)
Advanced Heart Failure Rounding Note  Primary Physician: Dr. Cyndia Bent Primary Cardiologist: Dr [name] HF: Tyler [name] Shirlee Latch)  Subjective:    Breathing improved. Feels "achey all over".  Narcotics being held for constipation. Still feels swollen all over. Abdomen feels better after large BM yesterday.  He did not diurese well yesterday.  Out 1.1L but weight up 1 lb on Lasix gtt 10 mg/hr. Cr trending up 2.11 -> 2.19 -> 2.35.    Objective:   Weight Range: 281 lb 14.4 oz (127.869 kg) Body mass index is 40.45 kg/(m^2).   Vital Signs:   Temp:  [97.3 F (36.3 C)-98.9 F (37.2 C)] 98.4 F (36.9 C) (09/15 0412) Pulse Rate:  [60-64] 63 (09/15 0412) Resp:  [18-20] 18 (09/15 0412) BP: (134-168)/(38-60) 168/48 mmHg (09/15 0412) SpO2:  [98 %-100 %] 100 % (09/15 0412) Weight:  [281 lb 14.4 oz (127.869 kg)] 281 lb 14.4 oz (127.869 kg) (09/15 0412) Last BM Date: 04/07/15  Weight change: Filed Weights   04/06/15 1139 04/07/15 0500 04/08/15 0412  Weight: 284 lb 6.3 oz (129 kg) 280 lb 9.6 oz (127.279 kg) 281 lb 14.4 oz (127.869 kg)    Intake/Output:   Intake/Output Summary (Last 24 hours) at 04/08/15 1610 Last data filed at 04/07/15 2200  Gross per 24 hour  Intake    520 ml  Output   1700 ml  Net  -1180 ml     Physical Exam: General: Obese, Chronically ill appearing. SOB with speaking HEENT: normal Neck: supple. Thick, JVP 12 cm. Carotids 2+ bilat; no bruits. No lymphadenopathy or thryomegaly appreciated. Cor: PMI nondisplaced. Regular rate & rhythm. No M/G/R  Lungs: Diminished. Crackles in bases.  Abdomen: Obese, soft, nontender, ++ distended. No HSM. No bruits or masses. +BS Extremities: no cyanosis, clubbing, rash. 1+ LE edema. Covered wound on R LE. Followed outpatient. Neuro: alert & orientedx3, cranial nerves grossly intact. moves all 4 extremities w/o difficulty. Affect flat  Telemetry:  A-paced, v-sensed 60s. Occasional PVCs  Labs: CBC  Recent Labs  04/05/15 1537  04/07/15 0325 04/08/15 0345  WBC 8.1 5.4 6.8  NEUTROABS 6.2  --   --   HGB 10.7* 9.7* 9.9*  HCT 33.8* 31.7* 32.3*  MCV 92.1 91.6 91.2  PLT 174 151 151   Basic Metabolic Panel  Recent Labs  04/07/15 0325 04/07/15 1157 04/08/15 0345  NA 136  --  133*  K 4.7  --  4.8  CL 101  --  97*  CO2 28  --  26  GLUCOSE 249*  --  388*  BUN 33*  --  38*  CALCIUM 8.3*  --  8.2*  MG  --  2.0  --    Liver Function Tests  Recent Labs  04/05/15 1537  AST 19  ALT 17  ALKPHOS 105  BILITOT 0.6  PROT 7.8  ALBUMIN 3.7   No results for input(s): LIPASE, AMYLASE in the last 72 hours. Cardiac Enzymes  Recent Labs  04/05/15 1537 04/06/15 0315  TROPONINI <0.03 0.03    BNP: BNP (last 3 results)  Recent Labs  03/15/15 1820 04/05/15 1537  BNP 195.8* 317.1*    ProBNP (last 3 results) No results for input(s): PROBNP in the last 8760 hours.   D-Dimer  Recent Labs  04/05/15 1537  DDIMER <0.27   Hemoglobin A1C No results for input(s): HGBA1C in the last 72 hours. Fasting Lipid Panel No results for input(s): CHOL, HDL, LDLCALC, TRIG, CHOLHDL, LDLDIRECT in the last 72 hours. Thyroid Function Tests  No results for input(s): TSH, T4TOTAL, T3FREE, THYROIDAB in the last 72 hours.  Invalid input(s): FREET3  Imaging/Studies:  Dg Chest 2 View  04/07/2015   CLINICAL DATA:  Chest tightness and shortness of breath.  EXAM: CHEST  2 VIEW  COMPARISON:  04/05/2015  FINDINGS: Cardiac silhouette is mildly enlarged. No mediastinal or hilar masses or convincing adenopathy.  Lung volumes are relatively low. Allowing for this, lungs are clear. No pleural effusion or pneumothorax.  Sequential pacemaker is stable and well positioned.  Bony thorax is grossly intact.  IMPRESSION: No active cardiopulmonary disease.   Electronically Signed   By: Amie Portland M.D.   On: 04/07/2015 13:30   Abd 1 View (kub)  04/06/2015   CLINICAL DATA:  Abdominal distention.  EXAM: ABDOMEN - 1 VIEW  COMPARISON:   06/07/2012.  FINDINGS: Cardiac pacer with lead tips in right atrium right ventricle. Air-filled loops of small and large bowel are noted. Minimal distention. A mild adynamic ileus cannot be excluded. No free air.  IMPRESSION: Cannot exclude mild adynamic ileus.  Exam otherwise unremarkable.   Electronically Signed   By: Maisie Fus  Register   On: 04/06/2015 09:25    Latest Echo  Latest Cath   Medications:     Scheduled Medications: . amiodarone  200 mg Oral BID  . cycloSPORINE  1 drop Both Eyes BID  . donepezil  10 mg Oral QHS  . gabapentin  100 mg Oral TID  . insulin aspart  0-20 Units Subcutaneous TID WC  . insulin glargine  35 Units Subcutaneous QHS  . loratadine  10 mg Oral Daily  . mirabegron ER  50 mg Oral Daily  . mometasone-formoterol  2 puff Inhalation BID  . montelukast  10 mg Oral QHS  . mupirocin cream  1 application Topical Daily  . pantoprazole  40 mg Oral Daily  . polyethylene glycol  17 g Oral BID  . sertraline  25 mg Oral Daily  . sodium chloride  3 mL Intravenous Q12H  . sodium chloride  3 mL Intravenous Q12H  . tamsulosin  0.4 mg Oral Daily  . warfarin  7.5 mg Oral q1800  . Warfarin - Pharmacist Dosing Inpatient   Does not apply q1800    Infusions: . furosemide (LASIX) infusion 10 mg/hr (04/07/15 1629)    PRN Medications: sodium chloride, acetaminophen, albuterol, diphenhydrAMINE-zinc acetate, hydrALAZINE, sodium chloride   Assessment/Plan   Tyler [name] is a 81 y.o. male with history of Chronic diastolic CHF, Echo 03/16/15 Echo EF 55-60%, Grade 2 DD, mildly dilated RV, hx of sick sinus syndrome s/p STJ dual chamber PCM 2013, paroxysmal a fib stable on coumadin, OHS/OSA non-compliant with CPAP, HTN, and DM who presented with worsening SOB, orthopnea, and edema.  1. Acute on chronic diastolic HF, EF 55-60%, grade 2 DD, mildly dilated RV. s/p STJ dual chamber PPM 2013 - Remains volume overloaded with 1+ LE edema, Cr will likely limit aggressive  diuresis. - Weight today 280 standing. Recent weights in mid 270s. - Continue lasix gtt @ 10 mg/hr this am. Will discuss optimal transition to po diuretics with MD for best preservation of kidney function.  - Continue daily weights, strict I/O, and fluid restriction 2. OSA/OHS - Likely major contributors to his symptoms as well as his RV dysfunction.  - Will follow up with pulmonary sleep medicine at discharge. 3. Paroxysmal a fib - CHA2DS2VASC score at least 5 (7.2% risk of stroke per year) - On chronic coumadin - hx of cardioversion -  NSR currently.  - Interrogation showed 49% afib burden since 11/02/14 - Started on amio 200 mg BID 9/13 4. AKI on CKD Stage III-IV - Will monitor closely with diuresis - Relatively stable thus far. 2.08 -> 2.11 -> 2.19 5. Hyperkalemia - Improved today. 5.3 -> 4.7 6. HTN 7. Hx of sick sinus syndrome/symptomatic bradycardia - s/p STJ dual chamber PPM 2013 as above. 8. DM 9. Constipation - GI following. No need for decompression currently. - Improved s/p SMOG enema and large BM. 10. Acute on Chronic pain - Per Primary team - Elevated uric acid.  ? Gout flare.  Length of Stay: 3  Graciella Freer PA-C 04/08/2015, 7:22 AM  Advanced Heart Failure Team Pager [phone] (M-F; 7a - 4p)  Please contact [facility] for night-coverage after hours (4p -7a ) and weekends on [email or web address]  Patient seen with PA, agree with the above note.  1. Acute on chronic diastolic CHF: I suspect that he has a significant component of RV failure as well in the setting of untreated OSA, ?OHS. He remains volume overloaded on exam and has gained a lot of weight recently. He was in the hospital last month also with CHF but suspect that he was not fully diuresed before going home. Diuresis slowed yesterday and creatinine up a bit. - Continue Lasix gtt 10 mg/hr today, will give metolazone 5 mg x 1 today to try to mobilize fluid. 2. CKD: Baseline creatinine around 2.  Mild rise in creatinine today but still has volume overload.  As above, will give metolazone today, will continue to follow creatinine closely.  3. OSA, ?OHS: Untreated OSA, question OHS. I suspect that this contributes to his RV failure. He will need followup with pulmonary sleep medicine at discharge.  4. Atrial fibrillation: Paroxysmal. Currently a-paced, v-sensed. He is on warfarin chronically. 49% atrial fib/flutter burden since 4/16 on interrogation this admission. Added amiodarone 200 mg bid.   5. Ogilvie's Syndrome: Had BM yesterday, less abdominal discomfort.  6. HTN: Will add amlodipine 2.5 mg daily.  Marca Ancona 04/08/2015 8:17 AM

## 2015-04-08 NOTE — Progress Notes (Signed)
Pt a/o, pt c/o generalized pain, PRN Tylenol given as ordered, pt oob with PT max assist, VSS, pt stable

## 2015-04-08 NOTE — Progress Notes (Signed)
Family Medicine Teaching Service Daily Progress Note Intern Pager: [phone]  Patient name: Tyler [name] Medical record number: [id number] Date of birth: 1933-09-21 Age: 81 y.o. Gender: male  Primary Care Provider: [name], MD Consultants: GI, Cardiology Code Status: FULL  Pt Overview and Major Events to Date:  9/12: Admitted for shortness of breath concerning for CHF exacerbation, Wt 291 9/13: Continued on Lasix drip. Wt 280lbs. Amiodarone started for PAF. 9/14: Continued Lasix drip. Wt 281lbs.  Assessment and Plan:  Tyler [name] is a 81 y.o. male presenting with recurrent hypoxemic respiratory failure from acute CHF exacerbation. PMH is significant for HFpEF, HTN, HLD, T2DM, OSA, OHS, SSS and PAF s/p dual chamber pacer 2013.  Acute on Chronic Diastolic HF, HFpEF: Echo on most recent admission with EF 55-60%, G2DD. Troponins negative x2 and CXR with stable cardiomegaly. Pro-BNP elevated to 317. Discharge weight 279 on 8/24 (?DW). This admission, weight 291>275>284>280>281 (9/15); did not diurese well yesterday, as only 1.1 L out and weight up 1lb on Lasix gt 10 mg/hr. Patient continues to be notably edematous throughout his body, though with some improvement since yesterday's exam. - Heart Failure team following, appreciate recommendations - Per Heart Failure team, continue Lasix gtt @ 10 mg/hr today; metolazone 5 mg x1 9/15. - Daily weights, strict I/O, fluid restriction - Holding beta blocker given low suspicion for ACS and acute CHF exacerbation - Restart statin, as erythromycin now discontinued  Stage III CKD: Scr 1.98 (admission) > 2.35 (9/15). Creatinine will limit aggressive diuresis. - Continue to monitor closely given need to diuresis in setting of CHF - Milk of Magnesia outpatient prn constipation treatment should be discontinued at discharge due to SCr persistently >2mg /dl.  Acute Hypoxemic Respiratory Failure: Likely multifactorial with complicated lung  history of asthma, OHS, OSA, and 20 pack/year smoking history with likely undiagnosed COPD. As above, patient also with current CHF exacerbation that is likely impairing his already compromised respiratory status. Protuberant abdomen also an exacerbating factor.  - Continue diuresis as above - Will discuss need for CPAP with patient - Wean O2 as tolerated, current 2L O2 requirement - Continue allergy/reactive airway therapy: flonase, loratadine, singulair, formulary LABA/ICS, PRN albuterol - Duonebs q6h PRN for wheezing  Ogilvie's Syndrome: Abdominal distension present  for >1 year at baseline, progressive. Negative abdominal US last admission; KUB this admission notable for possible adynamic ileus. Per daughter, this is chronic in nature with treatment dating back to early 2000s. On daily Miralax at home, switched to PRN at admission with subsequent constipation and increased abdominal distension. Yesterday, seen by GI and given SMOG enema with 3 large bowel movements and decreased abdominal distension. - Appreciate GI recommendations - Will discontinue IV Erythromycin - BID Miralax - Continue PPI  Hypertension: BP intermittently elevated overnight; no hydralazine requirement. - Start Norvasc 2.5 mg PO daily - Hydralazine 10mg  IV q2h PRN for SBP > 170  Type II Diabetes Mellitus, Insulin-Dependent: Well, possibly too tightly, controlled - Hb A1c 6.6% on 9/8. Though longstanding sequelae likely include nephropathy, neuropathy, and gastroparesis (Dx w/gastroscintigraphy 2006). EMR states lantus dose is 27u qAM and 45u qPM and novolog SSI. Overnight, BG up to 400s and patient given 20 U regular insulin. 351 on recheck. - Lantus 45U qhs - Resistant SSI qAC/HS - Novolog 5U TID with meals - Consider d/c on this dose - Continue neurontin 100mg  TID  Arrhythmias: Sick Sinus, Paroxysmal AFib/Flutter: Patient is on therapeutic anticoagulation on coumadin s/p dual chamber placement, with interrogation  this admission  demonstrative of 49% atrial fibrillation burden since 11/02/14. - Per Heart Failure team, amiodarone 200 mg BID started (9/13) - Anticoagulation per pharmacy, PT-INR mildly prolonged today  Hyperkalemia, Resolved: Resolved, K 4.5 9/15. - Lasix as above - Follow BMP  Chronic Pain: - Continue to hold narcotics as can be contributing to constipation - Discontinued home mobic given CHF - Tylenol PRN pain   Social: Pt is being treated for cognitive impairment. Mr. Utter daughter, [name], requests daily updates to which the patient has consented verbally.She had been told that definitive plans for the day can be relayed to her around 1:45 - 2:00pm daily after rounds and noon conference. - Continue donepezil  History of recurrent Klebsiella UTI due to Bladder outlet obstruction from BPH: No evidence of infection at this time with good UOP  - Low threshold for repeat U/A and bladder scan if symptoms recur or urinary retention suspected. Consider further urologic investigation as outpatient.  - Continue myrbetriq ;flomax, hold cardura as redundant therapy - Hold elavil for concern of worsening retention  Chronic RLE wound: Followed at wound care clinic as outpatient.  - WOC recommendations appreciated.  FEN/GI: Heart healthy/carb modified diet, saline lock IV Prophylaxis: Therapeutic coumadin  Disposition: Continue inpatient admission.  Subjective:  This morning, patient reports diffuse abdominal discomfort with moderate improvement in abdominal distension following 3 bowel movements during the day yesterday. Passing gas overnight. Endorses very mild improvement in shortness of breath. Denies chest pain, palpitations. Denies dysuria, urinary retention. No family at bedside this morning.  Objective: Temp:  [97.3 F (36.3 C)-98.9 F (37.2 C)] 98.4 F (36.9 C) (09/15 0412) Pulse Rate:  [60-64] 63 (09/15 0412) Resp:  [18-20] 18 (09/15 0412) BP: (134-168)/(38-60) 168/48  mmHg (09/15 0412) SpO2:  [98 %-100 %] 100 % (09/15 0412) Weight:  [127.869 kg (281 lb 14.4 oz)] 127.869 kg (281 lb 14.4 oz) (09/15 0412)  Physical Exam: General: Patient resting comfortably, in no acute distress. Eating breakfast with assistance of RN. HEENT: Patient speaking through clenched jaw. Cardiovascular: RRR without murmur/rub/gallop. Radial pulses 2+ bilaterally. Respiratory: Normal work of breathing on 2L [place]. Faint, transient expiratory wheezes appreciate diffusely. Decreased breath sounds in the bases bilaterally; no crackles or rhonchi appreciated. Abdomen: Marked protuberance, improved from previous exam. Mild tenderness to palpation diffusely. No fluid wave. Normoactive bowel sounds present. Extremities: Warm and well-perfused. 1+ bilateral pitting dependent edema and upper extremities with 1+ non-pitting edema. Extremities: RLE wound wrapped in ACE bandage. L great toe with small bandage.   Intake/Output Summary (Last 24 hours) at 04/08/15 0925 Last data filed at 04/08/15 0820  Gross per 24 hour  Intake    660 ml  Output   1700 ml  Net  -1040 ml    Laboratory:  Recent Labs Lab 04/05/15 1537 04/07/15 0325 04/08/15 0345  WBC 8.1 5.4 6.8  HGB 10.7* 9.7* 9.9*  HCT 33.8* 31.7* 32.3*  PLT 174 151 151    Recent Labs Lab 04/05/15 1537  04/06/15 1012 04/07/15 0325 04/08/15 0345  NA 140  < > 141 136 133*  K 5.2*  < > 5.3* 4.7 4.8  CL 107  < > 108 101 97*  CO2 27  < > 28 28 26   BUN 34*  < > 31* 33* 38*  CREATININE 1.98*  < > 2.11* 2.19* 2.35*  CALCIUM 8.6*  < > 8.5* 8.3* 8.2*  PROT 7.8  --   --   --   --   BILITOT 0.6  --   --   --   --  ALKPHOS 105  --   --   --   --   ALT 17  --   --   --   --   AST 19  --   --   --   --   GLUCOSE 67  < > 111* 249* 388*  < > = values in this interval not displayed.  PT: 27.2 INR: 2.57  Imaging/Diagnostic Tests: Dg Chest 2 View  04/07/2015   CLINICAL DATA:  Chest tightness and shortness of breath.  EXAM: CHEST  2  VIEW  COMPARISON:  04/05/2015  FINDINGS: Cardiac silhouette is mildly enlarged. No mediastinal or hilar masses or convincing adenopathy.  Lung volumes are relatively low. Allowing for this, lungs are clear. No pleural effusion or pneumothorax.  Sequential pacemaker is stable and well positioned.  Bony thorax is grossly intact.  IMPRESSION: No active cardiopulmonary disease.   Electronically Signed   By: Amie Portland M.D.   On: 04/07/2015 13:30    Melford Aase, Med Student 04/08/2015, 7:23 AM  MS4, [facility] of Medicine FPTS Intern pager: [phone], text pages welcome  RESIDENT ADDENDUM  I have separately seen and examined the patient. I have discussed the findings and exam with the medical student, helped develop the management plan that is described in the student's note, and I agree with the content.  Additionally I have outlined my exam and assessment/plan below:   Belly feels better, appetite increased, after large BM. No daughter at bedside.  Gen: Pleasant elderly male CV: RRR, no murmur, 2+ JVD Pulm: Nonlabored, CTAB Abd: Still protuberant but less taut  A/P:  Ogilvie's syndrome: Not a new problem, restarted daily miralax. Somewhat improved s/p large BM. Appreciate GI assistance, no NGT, D/C erythromycin and so will also restart statin. Reglan dose limited by history of dyskinesia.   Rash: Does not seem to be drug reaction, favor contact allergic/irritatnt dermatitis with gown. Will follow after gown changed.   Acute HFpEF exacerbation/volume overload: Inadequate diuresis while on lasix gtt though creatinine continues to rise. Metolazone has been added. Could use the vest prior to discharge to verify full diuresis.  HTN: Above goal. Low dose norvasc added.   T2DM: Very uncontrolled CBGs worst at mealtimes, will increase lantus and add standing mealtime coverage.   - I attempted to call pt's daughter, [name] twice without answer.  Ryan B. Jarvis Newcomer, MD, PGY-3 04/08/2015 4:24 PM

## 2015-04-08 NOTE — Progress Notes (Signed)
Physical Therapy Treatment Patient Details Name: Tyler [name] MRN: [id number] DOB: August 16, 1933 Today's Date: 04/08/2015    History of Present Illness 81 y.o. male admitted with dyspnea and ileus. PMH is significant for CHF, SSS s/p pacemaker, a flutter/fib on warfarin, CKD, HTN, T2DM, HLD, BPH, Asthma, OSA, anemia    PT Comments    Pt more lethargic, decreased problem solving and delayed response to all questions and commands today. Pt with decreased ability with all transfers and requiring 2 person assist for mobility with right knee blocked in standing. Pt currently not appropriate for ALF and will require SNF if 2 person assist for D/C. Will continue to follow to maximize function and strength. RN updated on physical decline.   Follow Up Recommendations  SNF;Supervision for mobility/OOB     Equipment Recommendations  None recommended by PT    Recommendations for Other Services       Precautions / Restrictions Precautions Precautions: Fall Precaution Comments: pt with h/o falls and progressive decline in his mobility.     Mobility  Bed Mobility Overal bed mobility: Needs Assistance Bed Mobility: Supine to Sit     Supine to sit: +2 for physical assistance;Mod assist;HOB elevated     General bed mobility comments: pt with max cues and assist to elevate trunk from surface and pivot to EOB. Pt with decreased initiation and participation from last session  Transfers Overall transfer level: Needs assistance     Sit to Stand: +2 physical assistance;From elevated surface;Mod assist Stand pivot transfers: Mod assist;+2 physical assistance       General transfer comment: pt unable to stand from low bed today, with elevated surface required mod assist for anterior translation and elevation. Pt weak and could not maintain with return to sitting. Second trial assist for pericare with right knee blocked and assist of 2 to pivot with Rw to chair. Pt with decreased control of  descent to chair  Ambulation/Gait Ambulation/Gait assistance:  (pt unable)               Stairs            Wheelchair Mobility    Modified Rankin (Stroke Patients Only)       Balance Overall balance assessment: Needs assistance   Sitting balance-Leahy Scale: Fair       Standing balance-Leahy Scale: Poor                      Cognition Arousal/Alertness: Lethargic Behavior During Therapy: Flat affect Overall Cognitive Status: Impaired/Different from baseline Area of Impairment: Problem solving             Problem Solving: Slow processing;Decreased initiation;Requires verbal cues      Exercises General Exercises - Lower Extremity Long Arc Quad: AROM;Both;Seated;15 reps Hip Flexion/Marching: AROM;Both;Seated;15 reps    General Comments        Pertinent Vitals/Pain Pain Assessment: No/denies pain    Home Living                      Prior Function            PT Goals (current goals can now be found in the care plan section) Progress towards PT goals: Not progressing toward goals - comment (pt with cognitive and physical decline from last session)    Frequency       PT Plan Discharge plan needs to be updated    Co-evaluation  End of Session Equipment Utilized During Treatment: Gait belt;Oxygen Activity Tolerance: Patient tolerated treatment well Patient left: in chair;with call bell/phone within reach;with chair alarm set     Time: 4098-1191 PT Time Calculation (min) (ACUTE ONLY): 25 min  Charges:  $Therapeutic Exercise: 8-22 mins $Therapeutic Activity: 8-22 mins                    G Codes:      Delorse Lek 04-30-15, 11:23 AM Delaney Meigs, PT [phone]

## 2015-04-08 NOTE — Progress Notes (Signed)
Notified on-call MD with family medicine of blood sugar of 400. Pt asymptomatic. Orders given to give 20 units of Regular insulin and to recheck blood sugar in one hour. Will continue to monitor patient to end of shift.

## 2015-04-08 NOTE — Progress Notes (Signed)
CSW spoke to patient this morning who denied any current concerns or problems.  Gave permission for CSW to contact his daughters as needed. CSW spoke with daughter [name] and gave introduction and phone number. CSW will monitor for possible date of stability. Plan return to [facility] when stable. Patient has home health services per daughter at ALF.  Lorri Frederick. Jaci Lazier, [place] [phone]

## 2015-04-08 NOTE — Progress Notes (Signed)
pts daughter stated that pt has assistance at his ALF that help him with ADLs and transfers, daughter also stated that pt has sensitivity to certain hygiene products and request that staff not use the deodorant provided by hospital

## 2015-04-08 NOTE — Progress Notes (Signed)
Daily Rounding Note  04/08/2015, 9:42 AM  LOS: 3 days   SUBJECTIVE:       No complaints today. RN reports he had a large bowel movement yesterday evening, though the patient said he had just a small bowel movement (he doesn't seem reliable).  No abdominal pain no nausea or vomiting. Tolerating solids.  OBJECTIVE:         Vital signs in last 24 hours:    Temp:  [98.4 F (36.9 C)-98.9 F (37.2 C)] 98.4 F (36.9 C) (09/15 0412) Pulse Rate:  [61-63] 63 (09/15 0412) Resp:  [18-20] 18 (09/15 0412) BP: (134-168)/(38-53) 168/48 mmHg (09/15 0412) SpO2:  [99 %-100 %] 100 % (09/15 0412) Weight:  [281 lb 14.4 oz (127.869 kg)] 281 lb 14.4 oz (127.869 kg) (09/15 0412) Last BM Date: 04/07/15 Filed Weights   04/06/15 1139 04/07/15 0500 04/08/15 0412  Weight: 284 lb 6.3 oz (129 kg) 280 lb 9.6 oz (127.279 kg) 281 lb 14.4 oz (127.869 kg)   General: [place], comfortable   Heart: RRR Chest: Clear bilaterally. No cough. Slight increase work of breathing Abdomen: A little bit softer but still tight and protuberant/distended. Bowel sounds present but hypoactive. No tenderness  Extremities: Bilateral lower extremity edema Neuro/Psych:  Appropriate, calm.  Intake/Output from previous day: 09/14 0701 - 09/15 0700 In: 800 [P.O.:600; I.V.:200] Out: 1700 [Urine:1700]  Intake/Output this shift: Total I/O In: 220 [P.O.:220] Out: -   Lab Results:  Recent Labs  04/05/15 1537 04/07/15 0325 04/08/15 0345  WBC 8.1 5.4 6.8  HGB 10.7* 9.7* 9.9*  HCT 33.8* 31.7* 32.3*  PLT 174 151 151   BMET  Recent Labs  04/06/15 1012 04/07/15 0325 04/08/15 0345  NA 141 136 133*  K 5.3* 4.7 4.8  CL 108 101 97*  CO2 28 28 26   GLUCOSE 111* 249* 388*  BUN 31* 33* 38*  CREATININE 2.11* 2.19* 2.35*  CALCIUM 8.5* 8.3* 8.2*   LFT  Recent Labs  04/05/15 1537  PROT 7.8  ALBUMIN 3.7  AST 19  ALT 17  ALKPHOS 105  BILITOT 0.6    PT/INR  Recent Labs  04/07/15 0325 04/08/15 0345  LABPROT 25.3* 27.2*  INR 2.33* 2.57*   Hepatitis Panel No results for input(s): HEPBSAG, HCVAB, HEPAIGM, HEPBIGM in the last 72 hours.  Studies/Results: Dg Chest 2 View  04/07/2015   CLINICAL DATA:  Chest tightness and shortness of breath.  EXAM: CHEST  2 VIEW  COMPARISON:  04/05/2015  FINDINGS: Cardiac silhouette is mildly enlarged. No mediastinal or hilar masses or convincing adenopathy.  Lung volumes are relatively low. Allowing for this, lungs are clear. No pleural effusion or pneumothorax.  Sequential pacemaker is stable and well positioned.  Bony thorax is grossly intact.  IMPRESSION: No active cardiopulmonary disease.   Electronically Signed   By: Amie Portland M.D.   On: 04/07/2015 13:30   Scheduled Meds: . amiodarone  200 mg Oral BID  . amLODipine  2.5 mg Oral Daily  . atorvastatin  40 mg Oral q1800  . cycloSPORINE  1 drop Both Eyes BID  . donepezil  10 mg Oral QHS  . gabapentin  100 mg Oral TID  . insulin aspart  0-20 Units Subcutaneous TID WC  . insulin aspart  5 Units Subcutaneous TID WC  . insulin glargine  45 Units Subcutaneous QHS  . loratadine  10 mg Oral Daily  . mirabegron ER  50 mg Oral Daily  .  mometasone-formoterol  2 puff Inhalation BID  . montelukast  10 mg Oral QHS  . mupirocin cream  1 application Topical Daily  . pantoprazole  40 mg Oral Daily  . polyethylene glycol  17 g Oral BID  . sertraline  25 mg Oral Daily  . sodium chloride  3 mL Intravenous Q12H  . sodium chloride  3 mL Intravenous Q12H  . tamsulosin  0.4 mg Oral Daily  . warfarin  7.5 mg Oral q1800  . Warfarin - Pharmacist Dosing Inpatient   Does not apply q1800   Continuous Infusions: . furosemide (LASIX) infusion 10 mg/hr (04/07/15 1629)   PRN Meds:.sodium chloride, acetaminophen, albuterol, diphenhydrAMINE-zinc acetate, hydrALAZINE, ipratropium-albuterol, sodium chloride  ASSESMENT:   * Ogilivie's syndrome, chronic and  longstanding. In the setting of having held his daily Miralax and admission with volume overload, he has not had a bowel movement for 3 days. No obstructive symptoms such as nausea or abdominal pain. Mild ileus on 9/13 KUB,  Electrolytes ok.   *  Diastolic CHF flare/volume overload.  Weight down 3 # in last 3 day.   * IDDM  * Chronic Coumadin. INR therapeutic.  * Hx gastroparesis. On Reglan at HS as outpt. Has hx tardive dyskinesia related to Reglan but tolerated low HS dose of this. Has not had any nausea or vomiting despite the ileus.      PLAN   *  Continue with twice a day oral MiraLAX.      Jennye Moccasin  04/08/2015, 9:42 AM Pager: [phone]  Attending Addendum: I have taken an interval history, reviewed the chart, and examined the patient. I agree with the Advanced Practitioner's note and impression. Patient had a very large bowel movement, passing gas and stool. Tolerating miralax. Wife reports he is more comfortable. Continue miralax, replace electrolytes as needed. Will sign off for now, please call with other questions moving forward.   Ileene Patrick, MD [facility] Gastroenterology Pager [phone]

## 2015-04-09 ENCOUNTER — Telehealth (HOSPITAL_COMMUNITY): Payer: Self-pay | Admitting: Surgery

## 2015-04-09 DIAGNOSIS — I130 Hypertensive heart and chronic kidney disease with heart failure and stage 1 through stage 4 chronic kidney disease, or unspecified chronic kidney disease: Secondary | ICD-10-CM | POA: Diagnosis not present

## 2015-04-09 LAB — BASIC METABOLIC PANEL
Anion gap: 8 (ref 5–15)
BUN: 34 mg/dL — AB (ref 6–20)
CALCIUM: 8.6 mg/dL — AB (ref 8.9–10.3)
CO2: 32 mmol/L (ref 22–32)
CREATININE: 2.13 mg/dL — AB (ref 0.61–1.24)
Chloride: 95 mmol/L — ABNORMAL LOW (ref 101–111)
GFR calc non Af Amer: 28 mL/min — ABNORMAL LOW (ref >60)
GFR, EST AFRICAN AMERICAN: 32 mL/min — AB (ref >60)
Glucose, Bld: 251 mg/dL — ABNORMAL HIGH (ref 65–99)
Potassium: 4.4 mmol/L (ref 3.5–5.1)
SODIUM: 135 mmol/L (ref 135–145)

## 2015-04-09 LAB — CBC
HCT: 32.4 % — ABNORMAL LOW (ref 39.0–52.0)
Hemoglobin: 10.5 g/dL — ABNORMAL LOW (ref 13.0–17.0)
MCH: 29.5 pg (ref 26.0–34.0)
MCHC: 32.4 g/dL (ref 30.0–36.0)
MCV: 91 fL (ref 78.0–100.0)
Platelets: 162 10*3/uL (ref 150–400)
RBC: 3.56 MIL/uL — ABNORMAL LOW (ref 4.22–5.81)
RDW: 12.9 % (ref 11.5–15.5)
WBC: 7.1 10*3/uL (ref 4.0–10.5)

## 2015-04-09 LAB — PROTIME-INR
INR: 2.76 — ABNORMAL HIGH (ref 0.00–1.49)
Prothrombin Time: 28.8 seconds — ABNORMAL HIGH (ref 11.6–15.2)

## 2015-04-09 LAB — GLUCOSE, CAPILLARY
GLUCOSE-CAPILLARY: 247 mg/dL — AB (ref 65–99)
GLUCOSE-CAPILLARY: 379 mg/dL — AB (ref 65–99)
Glucose-Capillary: 300 mg/dL — ABNORMAL HIGH (ref 65–99)
Glucose-Capillary: 310 mg/dL — ABNORMAL HIGH (ref 65–99)

## 2015-04-09 LAB — URIC ACID: URIC ACID, SERUM: 11.1 mg/dL — AB (ref 4.4–7.6)

## 2015-04-09 MED ORDER — METOLAZONE 5 MG PO TABS
5.0000 mg | ORAL_TABLET | Freq: Once | ORAL | Status: AC
  Administered 2015-04-09: 5 mg via ORAL
  Filled 2015-04-09: qty 1

## 2015-04-09 MED ORDER — SIMETHICONE 80 MG PO CHEW
80.0000 mg | CHEWABLE_TABLET | Freq: Four times a day (QID) | ORAL | Status: DC
  Administered 2015-04-09 – 2015-04-21 (×48): 80 mg via ORAL
  Filled 2015-04-09 (×47): qty 1

## 2015-04-09 MED ORDER — WARFARIN SODIUM 5 MG PO TABS
5.0000 mg | ORAL_TABLET | Freq: Once | ORAL | Status: AC
  Administered 2015-04-09: 5 mg via ORAL
  Filled 2015-04-09: qty 1

## 2015-04-09 NOTE — Progress Notes (Signed)
Paged concerning pain in patients hip and R knee.  R knee pain ongoing from fall 3 weeks ago, and unchanged.  Has been achey all over since admission but more sore in buttocks and hips since he has been lying in bed.  Says he feels better after moving around with PT and being shown how to shift weight off of his RLE.    Exam: No acute distress. No obvious swelling, erythema, or warmth in problem joints.   Will continue to monitor, likely combination of deconditioning and immobility, as pain has improves with repositioning.  Had elevated uric acid this admission but no hx of gout.  Will repeat as may have component of gout from diuresis.   Casimiro Needle [address]" [place], PA-C 04/09/2015 11:59 AM

## 2015-04-09 NOTE — Progress Notes (Signed)
FMTS Attending Daily Note:  S:  Patient feels better this AM.  Still with distended abdomen but improved.  Breathing improved.   O:  Gen: Elderly AAM lying in bed, NAD.   Heart: RRR.   Lungs:  Slight crackles BL bases Abd:  Protuberant.  Softer than has been.  Bowel sounds present.  Minimally TTP. Ext:  +2 edema BL to knees.   Neuro:  Alert and oriented.  Imp/Plan: 1.  CHF exacerbation: - weight down today.  Appreciate cardiology input.  Lasix plus metalozone drip. - Plan switch to PO meds tomorrow - Creatinine actually decreased today.  - Dyspnea has improved, weaned off of oxygen.    2.  CKD Stage III: - as above, improved.  Following while diuresing.  3.  Ogilvie's Syndrome: - Continue Miralax for constipation.  - hold anticholinergics on discharge.   Will sign resident note when completed.   Tobey Grim, MD 04/09/2015 2:29 PM

## 2015-04-09 NOTE — Progress Notes (Signed)
Heart Failure Navigator Consult Note  Presentation: Tyler [name] is a 81 y.o. male presenting with dyspnea . PMH is significant for CHF, SSS s/p pacemaker, a flutter/fib on warfarin, CKD, HTN, T2DM, HLD, BPH, Asthma, OSA, anemia.  Past Medical History  Diagnosis Date  . Diabetes mellitus    . Hyperlipidemia    . Hypertension    . Asthma   . Allergic rhinitis   . GERD (gastroesophageal reflux disease)   . BPH (benign prostatic hypertrophy)     s/p thermo therapy, has bladdero utlet obst. and instablitiy, incontient  . Anemia   . Chronic constipation   . Sick sinus syndrome     a. s/p STJ dual chamber pacemaker  . Paroxysmal atrial fibrillation     a. status post direct cardioversion b. anticoagulated with Warfarin  . Obesity   . Sleep apnea     noncomplicance w/ CPAP  . Gastroparesis     dx in 2006 through gastric scan  . Gynecomastia     nomral mammograms 11/2008  . Chronic kidney disease   . Globus sensation     2012 EGD, Ba swallow, both negative.  MBSS 2012 negative for dysphagia.   Marland Kitchen Atypical atrial flutter     a. CL b. able to be pace terminated    Social History   Social History  . Marital Status: Widowed    Spouse Name: N/A  . Number of Children: 2  . Years of Education: N/A   Occupational History  . retired    Social History Main Topics  . Smoking status: Former Smoker    Types: Cigarettes    Quit date: 07/25/1971  . Smokeless tobacco: Former Neurosurgeon    Types: Chew  . Alcohol Use: No  . Drug Use: No  . Sexual Activity: Not Asked   Other Topics Concern  . None   Social History Narrative   Has lived in facility x aprox 4 years   Moved to [place] 08-2008   Does not have a POA   Does not have a living will    ECHO:Study Conclusions--03/16/15  - Left ventricle: The cavity size was normal. Wall thickness was normal. Systolic function was normal. The estimated ejection fraction was in the range of 55% to 60%. Wall motion was  normal; there were no regional wall motion abnormalities. Features are consistent with a pseudonormal left ventricular filling pattern, with concomitant abnormal relaxation and increased filling pressure (grade 2 diastolic dysfunction). Doppler parameters are consistent with high ventricular filling pressure. - Right ventricle: The cavity size was mildly dilated. - Right atrium: The atrium was mildly dilated. - Atrial septum: There was an atrial septal aneurysm.  Impressions:  - Technically difficult; definity used; normal LV systolic function; grade 2 diastolic dysfunction; mild RAE and RVE.  Transthoracic echocardiography. M-mode, complete 2D, spectral Doppler, and color Doppler. Birthdate: Patient birthdate: Apr 05, 1934. Age: Patient is 81 yr old. Sex: Gender: male. BMI: 41.2 kg/m^2. Blood pressure:   129/72 Patient status: Inpatient. Study date: Study date: 03/16/2015. Study time: 11:50 AM. Location: Bedside.  BNP    Component Value Date/Time   BNP 317.1* 04/05/2015 1537    ProBNP    Component Value Date/Time   PROBNP 48.1 01/13/2007 2320     Education Assessment and Provision:  Detailed education and instructions provided on heart failure disease management including the following:  Signs and symptoms of Heart Failure When to call the physician Importance of daily weights Low sodium diet Fluid restriction Medication  management Anticipated future follow-up appointments  Patient education given on each of the above topics.  Patient acknowledges understanding and acceptance of all instructions.   Spoke briefly with Tyler [name] regarding his HF.  He lives in an Assisted Living Facility and tells me that he is currently weighed only weekly.  He says he "thinks that they watch salt and sodium in his food".  He seems a bit lethargic and was complaining of a rash and pain while I was there so we ended education.   I will plan to call and talk  with his daughter as well to reinforce HF education.  Education Materials:  "Living Better With Heart Failure" Booklet, Daily Weight Tracker Tool    High Risk Criteria for Readmission and/or Poor Patient Outcomes:  (Recommend Follow-up with Advanced Heart Failure Clinic)--yes   EF <30%- no 55-60% with grade 2 dias dys  2 or more admissions in 6 months-2/6  Difficult social situation- no- Lives at [facility] medication noncompliance- no denies    Barriers of Care:  Knowledge and compliance--as well as knowledge and compliance of care providers at [facility] (ALF)  Discharge Planning:   Plans to return to ALF with [facility].  He will benefit with ongoing education, compliance reinforcement and symptom recognition.

## 2015-04-09 NOTE — Telephone Encounter (Signed)
I received a call back from [name] Forse--patient's daughter.  I reinforced to her the importance for his daily weights and when to call the physician related to weights.  She tells me that she does not believe that it will be an issue for the ALF to do daily weights if requested at discharge.  We also discussed the need for a low sodium diet.  She does admit that her father loves "salt on his watermelon".  Most meals are provided at the ALF and she says "they should have" the capability to serve a heart healthy low sodium diet.  She or her sister will accompany him to most appts and she says that the ALF supplies transportation.   I encouraged her to call me back with concerns or questions related to her father's HF.

## 2015-04-09 NOTE — Progress Notes (Signed)
Pt a/o, c/o pain in right hip, pt repositioned with good effect, pt has sensitivity to hospital gowns and linens therefore linens and gown changed to cotton and pt stated he has relief, simethicone given as ordered, pt states he is passing gas, abd still distented, no BM today, cardiac monitor dc'd per MD orders, new IV placed d/t infiltration, pt remains on lasix gtt @ 10 ml/hr, pt O2 97% on room air, no c/o SOB, O2 within reach if pt does feel SOB, pt has good output, VSS, pt stable

## 2015-04-09 NOTE — Progress Notes (Signed)
Pt c/o numbness in hips and pain in right knee, MD notified

## 2015-04-09 NOTE — Progress Notes (Signed)
Notified Family Medicine of patient experiencing gas and may be contributing to distention of abdomen. Family was concerned and asked if he could something to help get the "air" out. Told family would let doctor know and will suggest maybe Simethicone to help patient get rid of gas. Patient also had expelled lots of gas during the enema that was given night before last and explained to doctor this morning that he may feel more comfortable if something was given to help expel or get rid of it. Patient does not really complain of it but appears to be uncomfortable with distended stomach/abdomen. Will continue to monitor to end of shift.

## 2015-04-09 NOTE — Progress Notes (Signed)
Inpatient Diabetes Program Recommendations  AACE/ADA: New Consensus Statement on Inpatient Glycemic Control (2015)  Target Ranges:  Prepandial:   less than 140 mg/dL      Peak postprandial:   less than 180 mg/dL (1-2 hours)      Critically ill patients:  140 - 180 mg/dL   Results for DEAKON, FRIX (MRN [id number]) as of 04/09/2015 07:42  Ref. Range 04/08/2015 07:36 04/08/2015 10:36 04/08/2015 15:56 04/08/2015 20:59 04/09/2015 05:24  Glucose-Capillary Latest Ref Range: 65-99 mg/dL 351 (H) 303 (H) 227 (H) 184 (H) 247 (H)   Review of Glycemic Control  Diabetes history: DM 2 Outpatient Diabetes medications: Lantus 27 units QAM, 45 units QPM, Novolog 10 units with breakfast, Novolog 10 units with lunch, and Novolog 12 units with supper Current orders for Inpatient glycemic control: Lantus 45 units QHS, Novolog 0-20 units TID with meals, Novolog 5 units TID with meals for meal coverage  Inpatient Diabetes Program Recommendations: Insulin - Basal: Please consider increasing Lantus to 50 units QHS. Insulin - Meal Coverage: Noted Novolog 5 units TID with meals ordered for meal coverage yesterday afternoon. Patient did NOT receive meal coverage with supper yesterday as medication was charted as Patient Refused. NURSING: Please encourage patient to take insulin as ordered when parameters are met in order to improve inpatient glycemic control.  Thanks, Barnie Alderman, RN, MSN, CCRN, CDE Diabetes Coordinator Inpatient Diabetes Program [phone] (Team Pager from [place] to [place]) [phone] ([place] office) [phone] [facility] office) [phone] [facility] office)

## 2015-04-09 NOTE — Progress Notes (Signed)
Advanced Heart Failure Rounding Note  Primary Physician: Dr. Cyndia Bent Primary Cardiologist: Dr [name] HF: [place]  Subjective:    Says breathing is feeling somewhat better.  Belly still very distended. Not as achey today. Feels tired  Out 1.5L with addition of 5 mg metolazone on Lasix gtt 10. Standing weight 273. Cr trending down 2.35 -> 2.13  Objective:   Weight Range: 268 lb 3.2 oz (121.655 kg) Body mass index is 38.48 kg/(m^2).   Vital Signs:   Temp:  [97.5 F (36.4 C)-98.4 F (36.9 C)] 98.3 F (36.8 C) (09/16 0517) Pulse Rate:  [55-66] 61 (09/16 0517) Resp:  [18-20] 20 (09/16 0517) BP: (150-174)/(50-84) 174/52 mmHg (09/16 0517) SpO2:  [98 %-100 %] 100 % (09/16 0517) Weight:  [268 lb 3.2 oz (121.655 kg)] 268 lb 3.2 oz (121.655 kg) (09/16 0517) Last BM Date: 04/08/15  Weight change: Filed Weights   04/07/15 0500 04/08/15 0412 04/09/15 0517  Weight: 280 lb 9.6 oz (127.279 kg) 281 lb 14.4 oz (127.869 kg) 268 lb 3.2 oz (121.655 kg)    Intake/Output:   Intake/Output Summary (Last 24 hours) at 04/09/15 0728 Last data filed at 04/09/15 0600  Gross per 24 hour  Intake   1200 ml  Output   2650 ml  Net  -1450 ml     Physical Exam: General: Obese, Chronically ill appearing. SOB with speaking HEENT: normal Neck: supple. Thick, JVP 10-11 cm. Carotids 2+ bilat; no bruits. No lymphadenopathy or thyromegaly noted. Cor: PMI nondisplaced. RRR. No M/G/R appreciated Lungs: Diminished bilateral bases Abdomen: Obese, soft, NT, mod-severe distention. No HSM. No bruits or masses. Distant bowel sounds. Extremities: no cyanosis, clubbing, rash. 1+ LE edema. Covered wound on R LE. Followed outpatient. Neuro: alert & orientedx3, cranial nerves grossly intact. moves all 4 extremities w/o difficulty. Affect flat  Telemetry:  A-paced, v-sensed 60s.   Labs: CBC  Recent Labs  04/08/15 0345 04/09/15 0426  WBC 6.8 7.1  HGB 9.9* 10.5*  HCT 32.3* 32.4*  MCV 91.2 91.0  PLT 151 162    Basic Metabolic Panel  Recent Labs  04/07/15 1157 04/08/15 0345 04/09/15 0426  NA  --  133* 135  K  --  4.8 4.4  CL  --  97* 95*  CO2  --  26 32  GLUCOSE  --  388* 251*  BUN  --  38* 34*  CALCIUM  --  8.2* 8.6*  MG 2.0  --   --    Liver Function Tests No results for input(s): AST, ALT, ALKPHOS, BILITOT, PROT, ALBUMIN in the last 72 hours. No results for input(s): LIPASE, AMYLASE in the last 72 hours. Cardiac Enzymes No results for input(s): CKTOTAL, CKMB, CKMBINDEX, TROPONINI in the last 72 hours.  BNP: BNP (last 3 results)  Recent Labs  03/15/15 1820 04/05/15 1537  BNP 195.8* 317.1*    ProBNP (last 3 results) No results for input(s): PROBNP in the last 8760 hours.   D-Dimer No results for input(s): DDIMER in the last 72 hours. Hemoglobin A1C No results for input(s): HGBA1C in the last 72 hours. Fasting Lipid Panel No results for input(s): CHOL, HDL, LDLCALC, TRIG, CHOLHDL, LDLDIRECT in the last 72 hours. Thyroid Function Tests No results for input(s): TSH, T4TOTAL, T3FREE, THYROIDAB in the last 72 hours.  Invalid input(s): FREET3  Imaging/Studies:  Dg Chest 2 View  04/07/2015   CLINICAL DATA:  Chest tightness and shortness of breath.  EXAM: CHEST  2 VIEW  COMPARISON:  04/05/2015  FINDINGS: Cardiac  silhouette is mildly enlarged. No mediastinal or hilar masses or convincing adenopathy.  Lung volumes are relatively low. Allowing for this, lungs are clear. No pleural effusion or pneumothorax.  Sequential pacemaker is stable and well positioned.  Bony thorax is grossly intact.  IMPRESSION: No active cardiopulmonary disease.   Electronically Signed   By: Amie Portland M.D.   On: 04/07/2015 13:30    Latest Echo  Latest Cath   Medications:     Scheduled Medications: . amiodarone  200 mg Oral BID  . amLODipine  2.5 mg Oral Daily  . atorvastatin  40 mg Oral q1800  . cycloSPORINE  1 drop Both Eyes BID  . donepezil  10 mg Oral QHS  . gabapentin  100 mg Oral  TID  . Influenza vac split quadrivalent PF  0.5 mL Intramuscular Tomorrow-1000  . insulin aspart  0-20 Units Subcutaneous TID WC  . insulin aspart  5 Units Subcutaneous TID WC  . insulin glargine  45 Units Subcutaneous QHS  . loratadine  10 mg Oral Daily  . mirabegron ER  50 mg Oral Daily  . mometasone-formoterol  2 puff Inhalation BID  . montelukast  10 mg Oral QHS  . mupirocin cream  1 application Topical Daily  . pantoprazole  40 mg Oral Daily  . polyethylene glycol  17 g Oral BID  . sertraline  25 mg Oral Daily  . sodium chloride  3 mL Intravenous Q12H  . sodium chloride  3 mL Intravenous Q12H  . tamsulosin  0.4 mg Oral Daily  . warfarin  7.5 mg Oral q1800  . Warfarin - Pharmacist Dosing Inpatient   Does not apply q1800    Infusions: . furosemide (LASIX) infusion 10 mg/hr (04/08/15 2321)    PRN Medications: sodium chloride, acetaminophen, albuterol, diphenhydrAMINE-zinc acetate, hydrALAZINE, ipratropium-albuterol, sodium chloride   Assessment/Plan   Tyler [name] is a 81 y.o. male with history of Chronic diastolic CHF, Echo 03/16/15 Echo EF 55-60%, Grade 2 DD, mildly dilated RV, hx of sick sinus syndrome s/p STJ dual chamber PCM 2013, paroxysmal a fib stable on coumadin, OHS/OSA non-compliant with CPAP, HTN, and DM who presented with worsening SOB, orthopnea, and edema.  1. Acute on chronic diastolic HF, EF 55-60%, grade 2 DD, mildly dilated RV. s/p STJ dual chamber PPM 2013 - Remains volume overloaded with 1+ LE edema into thighs.  Cr improved today with metolazone yesterday. - Will continue lasix gtt with metolazone today. - Continue daily weights, strict I/O, and fluid restriction 2. OSA/OHS - Likely major contributors to his symptoms as well as his RV dysfunction.  - Needs to follow up with pulmonary sleep medicine at discharge. 3. Paroxysmal a fib - CHA2DS2VASC score at least 5 (7.2% risk of stroke per year) - On chronic coumadin - hx of cardioversion - NSR  currently.  - Interrogation showed 49% afib burden since 11/02/14 - Started on amio 200 mg BID 9/13 4. AKI on CKD Stage III-IV - Will monitor closely with diuresis - Relatively stable thus far. 2.08 -> 2.11 -> 2.19 -> 2.35 -> 2.13 5. Hyperkalemia - Improved. 5.3 -> 4.7-> 4.4 6. HTN 7. Hx of sick sinus syndrome/symptomatic bradycardia - s/p STJ dual chamber PPM 2013 as above. 8. DM 9. Ogilvie's syndrome : Chronic - GI following. Improved with SMOG enema and BMs.  Length of Stay: 4  Graciella Freer PA-C 04/09/2015, 7:28 AM  Advanced Heart Failure Team Pager [phone] (M-F; 7a - 4p)  Please contact [facility] for  night-coverage after hours (4p -7a ) and weekends on [email or web address]  Patient seen with PA, agree with the above note.  Would like to see one more day of good diuresis, then likely convert over to po meds tomorrow.  Will give dose of metolazone 5 mg x 1 again today.  Tomorrow, likely stop Lasix gtt and start him back on torsemide 80 mg daily.  Creatinine so far stable.   If he goes home over weekend, we will arrange for followup in 10-14 days in CHF clinic.  Marca Ancona 04/09/2015 8:53 AM

## 2015-04-09 NOTE — Progress Notes (Signed)
ANTICOAGULATION CONSULT NOTE - Follow Up Consult  Pharmacy Consult for Coumadin Indication: atrial fibrillation  Allergies  Allergen Reactions  . Neosporin Original [Bacitracin-Neomycin-Polymyxin]     Rinaldo Cloud McWhite from [facility] [phone] states pt is allergic to Neosporin and Polysporin ointments.  . Reglan [Metoclopramide]     Tardive Dyskinenia    Patient Measurements: Height:  (177.8 cm) Weight: 273 lb 11.2 oz (124.15 kg) (scale a) IBW/kg (Calculated) : 73 Heparin Dosing Weight:   Vital Signs: Temp: 97.8 F (36.6 C) (09/16 0803) Temp Source: Oral (09/16 0803) BP: 137/62 mmHg (09/16 0803) Pulse Rate: 70 (09/16 0803)  Labs:  Recent Labs  04/07/15 0325 04/08/15 0345 04/09/15 0426  HGB 9.7* 9.9* 10.5*  HCT 31.7* 32.3* 32.4*  PLT 151 151 162  LABPROT 25.3* 27.2* 28.8*  INR 2.33* 2.57* 2.76*  CREATININE 2.19* 2.35* 2.13*    Estimated Creatinine Clearance: 37.2 mL/min (by C-G formula based on Cr of 2.13).  Assessment: 81yo male with AFib, INR therapeutic/stable on home dose of Coumadin.  Pt is on Amiodarone (new) and Erythromycin (added for gastroparesis), both of which may increase INR.  Hg with slight increase and pltc wnl.  No bleeding noted.    INR trending up this am to 2.7. Will likely need a  a couple of times a week on this drug regimen alternating with 7.5. Will give lower dose tonight.  Goal of Therapy:  INR 2-3 Monitor platelets by anticoagulation protocol: Yes   Plan:  Low Coumadin today and give   Continue daily INR to monitor for potential drug interactions Watch for s/s of bleeding  Sheppard Coil PharmD., BCPS Clinical Pharmacist Pager [phone] 04/09/2015 12:03 PM

## 2015-04-09 NOTE — Progress Notes (Signed)
Family Medicine Teaching Service Daily Progress Note Intern Pager: [phone]  Patient name: Tyler [name] Medical record number: [id number] Date of birth: 03/16/1934 Age: 81 y.o. Gender: male  Primary Care Provider: [name], MD Consultants: GI, Cardiology Code Status: FULL  Pt Overview and Major Events to Date:  9/12: Admitted for shortness of breath concerning for CHF exacerbation, Wt 291 9/13: Continued on Lasix drip. Wt 280lbs. Amiodarone started for PAF. 9/14: Continued Lasix drip. Wt 281lbs. 9/15: Continued Lasix drip, 1 dose Metolazone.  Assessment and Plan:  Tyler [name] is a 81 y.o. male presenting with recurrent hypoxemic respiratory failure from acute CHF exacerbation. PMH is significant for HFpEF, HTN, HLD, T2DM, OSA, OHS, SSS and PAF s/p dual chamber pacer 2013.  Acute on Chronic Diastolic HF, HFpEF: Echo on most recent admission with EF 55-60%, G2DD. Troponins negative x2 and CXR with stable cardiomegaly. Pro-BNP elevated to 317. Discharge weight 279 on 8/24 (?DW). This admission, weight 291>275>284>280>281. 9/16, weight 273 standing s/p lasix gtt with 1 dose 5 mg metolazone; net 1.4 L out. - Heart Failure team following, appreciate recommendations - Per Heart Failure team, continue Lasix gtt @ 10 mg/hr today with metolazone - Daily weights, strict I/O, fluid restriction - Holding beta blocker given low suspicion for ACS and acute CHF exacerbation - Continue atorvastatin 40 mg PO daily  Stage III CKD: Scr 1.98 (admission) > 2.35 (9/15), trended down to 2.13 (9/16). - Continue to monitor closely given need to diuresis in setting of CHF - Milk of Magnesia outpatient prn constipation treatment should be discontinued at discharge due to SCr persistently >2mg /dl.  Acute Hypoxemic Respiratory Failure: Likely multifactorial with complicated lung history of asthma, OHS, OSA, and 20 pack/year smoking history with likely undiagnosed COPD. As above, patient also  with current CHF exacerbation that is likely impairing his already compromised respiratory status. Protuberant abdomen also an exacerbating factor. Satting well on 2-3 L O2. - Continue diuresis as above - Wean O2 completely today - Continue allergy/reactive airway therapy: flonase, loratadine, singulair, formulary LABA/ICS, PRN albuterol - Duonebs q6h PRN for wheezing  - Patient will need CPAP on outpatient basis  Ogilvie's Syndrome: Abdominal distension present for >1 year at baseline, progressive. Negative abdominal US last admission; KUB this admission notable for possible adynamic ileus. Per daughter, this is chronic in nature with treatment dating back to early 2000s. On daily Miralax at home. Patient with abdominal discomfort overnight consistent with gas pain. - Appreciate GI recommendations - Will start simethicone for likely gas pains. - BID Miralax - Continue PPI  Rash: Considering distribution and pruritic nature of erythematous rash, likely secondary to irritation from gown. - Will switch to cotton gown today  Hypertension: BP intermittently elevated overnight; no hydralazine requirement. - Continue Norvasc 2.5 mg PO daily - Hydralazine  IV q2h PRN for SBP > 170  Type II Diabetes Mellitus, Insulin-Dependent: Well, possibly too tightly, controlled - Hb A1c 6.6% on 9/8. Though longstanding sequelae likely include nephropathy, neuropathy, and gastroparesis (Dx w/gastroscintigraphy 2006). EMR states lantus dose is 27u qAM and 45u qPM and novolog SSI. Overnight, BG ranging 184-303. - Per Diabetic Coordinator, increase Lantus to 50 U qhs - Resistant SSI qAC/HS - Novolog 5U TID with meals - Consider d/c on this dose - Continue neurontin  TID  Arrhythmias: Sick Sinus, Paroxysmal AFib/Flutter: Patient is on therapeutic anticoagulation on coumadin s/p dual chamber placement, with interrogation this admission demonstrative of 49% atrial fibrillation burden since 11/02/14. - Per  Heart Failure team,  amiodarone 200 mg BID started (9/13) - Anticoagulation per pharmacy, PT-INR increasingly prolonged today  Chronic Pain: - Continue to hold narcotics as can be contributing to constipation - Discontinued home mobic given CHF - Tylenol PRN pain  Social: Patient is being treated for cognitive impairment. Mr. Schoene daughter, Tyler [name], requests daily updates to which the patient has consented verbally.She had been told that definitive plans for the day can be relayed to her around 1:45 - 2:00pm daily after rounds and noon conference. - Continue donepezil  History of recurrent Klebsiella UTI due to Bladder outlet obstruction from BPH: No evidence of infection at this time with good UOP  - Low threshold for repeat U/A and bladder scan if symptoms recur or urinary retention suspected. Consider further urologic investigation as outpatient.  - Continue myrbetriq, flomax. Hold cardura as redundant therapy. - Hold elavil for concern of worsening retention.  Chronic RLE wound: Followed at wound care clinic as outpatient.  - WOC recommendations appreciated.  FEN/GI: Heart healthy/carb modified diet, saline lock IV Prophylaxis: Therapeutic coumadin  Disposition: Continue inpatient admission. Consider SNF at future discharge.  Subjective: Overnight, patient complained of abdominal distension and abdominal discomfort that he feels are "gas pains." Reports edema has improved with ambulation. Shortness of breath has improved minimally. Denies chest pain, palpitations. Rash pruritic but stable.  Objective: Temp:  [97.5 F (36.4 C)-98.4 F (36.9 C)] 98.3 F (36.8 C) (09/16 0517) Pulse Rate:  [55-66] 61 (09/16 0517) Resp:  [18-20] 20 (09/16 0517) BP: (150-174)/(50-84) 174/52 mmHg (09/16 0517) SpO2:  [98 %-100 %] 100 % (09/16 0517) Weight:  [121.655 kg (268 lb 3.2 oz)] 121.655 kg (268 lb 3.2 oz) (09/16 0517) Physical Exam: General: Sitting up in the chair, in no acute distress.  Appears more comfortable than during previous exam. Cardiovascular: RRR without murmur/rub/gallop. Radial pulses 2+ bilaterally. Respiratory: Normal work of breathing on 2L [place]. LCTAB without crackle or wheeze. Abdomen: Protuberant; less taut than during previous exams. Mild tenderness to palpation diffusely. Extremities: Warm and well-perfused. Skin: Mild erythematous rash in patches across the chest and abdomen.   Intake/Output Summary (Last 24 hours) at 04/09/15 0705 Last data filed at 04/09/15 0600  Gross per 24 hour  Intake   1200 ml  Output   2650 ml  Net  -1450 ml    Laboratory:  Recent Labs Lab 04/07/15 0325 04/08/15 0345 04/09/15 0426  WBC 5.4 6.8 7.1  HGB 9.7* 9.9* 10.5*  HCT 31.7* 32.3* 32.4*  PLT 151 151 162    Recent Labs Lab 04/05/15 1537  04/07/15 0325 04/08/15 0345 04/09/15 0426  NA 140  < > 136 133* 135  K 5.2*  < > 4.7 4.8 4.4  CL 107  < > 101 97* 95*  CO2 27  < > 28 26 32  BUN 34*  < > 33* 38* 34*  CREATININE 1.98*  < > 2.19* 2.35* 2.13*  CALCIUM 8.6*  < > 8.3* 8.2* 8.6*  PROT 7.8  --   --   --   --   BILITOT 0.6  --   --   --   --   ALKPHOS 105  --   --   --   --   ALT 17  --   --   --   --   AST 19  --   --   --   --   GLUCOSE 67  < > 249* 388* 251*  < > = values in this interval not  displayed.  PT: 28.8 INR: 2.76   Melford Aase, Med Student 04/09/2015, 7:03 AM MS4, [facility] FPTS Intern pager: [phone], text pages welcome   RESIDENT ADDENDUM  I have separately seen and examined the patient. I have discussed the findings and exam with the medical student, helped develop the management plan that is described in the student's note, and I agree with the content.  Additionally I have outlined my exam and assessment/plan below:   S: Feeling better, feels he can wean off O2  O: Gen: Pleasant elderly male CV: RRR, no murmur, 2+ JVD, 2+ LE edema Pulm: Nonlabored, CTAB Abd: Still protuberant but less taut. + BS Neuro:  Oriented, speech normal Skin: Areas of macular erythema on trunk  A/P:  Acute HFpEF exacerbation/volume overload:  - Weight down with lasix gtt, metolazone, creatinine also down suggesting AKI on CKD was due to diminished cardiac output now improving with volume optimization. Could use the vest prior to discharge to verify full diuresis.  Paroxysmal AFib:  - Stable with dual chamber pacer in situ. Amiodarone started per HF  Ogilvie's syndrome:  - Not a new problem, restarted daily miralax, holding anticholinergics. Added simethicone and continued PPI. Reglan dose limited by history of dyskinesia. Abdominal girth worsening OHS.  Rash:  - Improved. Doubt drug reaction.    CKD:  - Cr improved, suspect continued improvement until euvolemic  HTN:  - Above goal. Continue to monitor on low dose norvasc, increase if continues to be uncontrolled.   DM:  - Continue to monitor on lantus 45u qHS, mealtime 5u + res SSI  - Discussed with Daughter, Tyler [name].   Ryan B. Jarvis Newcomer, MD, PGY-3 04/10/2015 8:52 AM

## 2015-04-09 NOTE — Progress Notes (Signed)
Summary:  CSW has spoken throughout the day with daughters [name] and [name]. They are adamant that patient be allowed to return to [facility] at d/c. He receives home health with [facility] for RN and PT; daughters request OT services be added to home health.  Notified unit RNCM of this.  CSW sent updated clinicals to Dottie at [facility] and she agreed to accept patient back; unable to do weekend admission due to issues with pharmacy, staffing etc.  She will talk to daughters about increased cost of care at the facility due to his medical needs.  Fl2 on chart for MD's signature. Notified Family Medicine of above. CSW will assist with d/c when medically stable.  Lorri Frederick. Jaci Lazier, [place] [phone]

## 2015-04-09 NOTE — Progress Notes (Signed)
OT Cancellation Note  Patient Details Name: Tyler [name] MRN: [id number] DOB: 05-Feb-1934   Cancelled Treatment:    Reason Eval/Treat Not Completed: Other (comment).  Pt. States he is willing to "try and think about therapy later" but has just finished breakfast and needs a "few minutes".  He states "i never said i wont do therapy just not now".   Will check back as able.    Robet Leu 04/09/2015, 9:09 AM

## 2015-04-09 NOTE — Progress Notes (Signed)
Physical Therapy Treatment Patient Details Name: Tyler [name] MRN: [id number] DOB: 1933-12-10 Today's Date: 04/09/2015    History of Present Illness 81 y.o. male admitted with dyspnea and ileus. PMH is significant for CHF, SSS s/p pacemaker, a flutter/fib on warfarin, CKD, HTN, T2DM, HLD, BPH, Asthma, OSA, anemia    PT Comments    Pt with increased ability to maintain standing but continues to require +2 assist, unable to maintain fully upright and unable to shift weight. Pt educated for positioning to off weight RLE in chair to decrease pain as well as HEP to increase strength and function. Will continue to follow.   Follow Up Recommendations  SNF;Supervision for mobility/OOB     Equipment Recommendations       Recommendations for Other Services       Precautions / Restrictions Precautions Precautions: Fall Precaution Comments: pt with h/o falls and progressive decline in his mobility.     Mobility  Bed Mobility               General bed mobility comments: pt in chair on arrival  Transfers Overall transfer level: Needs assistance Equipment used: Rolling walker (2 wheeled) Transfers: Sit to/from Stand Sit to Stand: Mod assist;+2 physical assistance         General transfer comment: pt with max cues and assist to stand from recliner with armrest. Pt unable to achieve fully upright despite right knee blocked and max cues for hip and trunk extension as well as looking up. Pt stood 1 min each trial. Unable to step or weight shift in standing  Ambulation/Gait                 Stairs            Wheelchair Mobility    Modified Rankin (Stroke Patients Only)       Balance Overall balance assessment: Needs assistance   Sitting balance-Leahy Scale: Fair       Standing balance-Leahy Scale: Poor                      Cognition Arousal/Alertness: Awake/alert Behavior During Therapy: Flat affect   Area of Impairment: Problem  solving             Problem Solving: Slow processing;Decreased initiation;Requires verbal cues      Exercises General Exercises - Lower Extremity Long Arc Quad: AROM;Both;Seated;20 reps Hip ABduction/ADduction: AROM;Both;Seated;20 reps Hip Flexion/Marching: AROM;Both;Seated;20 reps    General Comments        Pertinent Vitals/Pain Pain Score: 4  Pain Location: righ knee pain Pain Descriptors / Indicators: Aching;Sore    Home Living                      Prior Function            PT Goals (current goals can now be found in the care plan section) Progress towards PT goals: Progressing toward goals;Goals downgraded-see care plan (very slowly)    Frequency       PT Plan Current plan remains appropriate    Co-evaluation             End of Session Equipment Utilized During Treatment: Gait belt;Oxygen Activity Tolerance: Patient tolerated treatment well Patient left: in chair;with call bell/phone within reach;with chair alarm set     Time: 6962-9528 PT Time Calculation (min) (ACUTE ONLY): 29 min  Charges:  $Therapeutic Exercise: 8-22 mins $Therapeutic Activity: 8-22 mins  G CodesDelorse Lek 2015/05/09, 9:50 AM Delaney Meigs, PT [phone]

## 2015-04-10 LAB — BASIC METABOLIC PANEL
ANION GAP: 9 (ref 5–15)
BUN: 38 mg/dL — ABNORMAL HIGH (ref 6–20)
CALCIUM: 8.6 mg/dL — AB (ref 8.9–10.3)
CHLORIDE: 91 mmol/L — AB (ref 101–111)
CO2: 32 mmol/L (ref 22–32)
CREATININE: 2.26 mg/dL — AB (ref 0.61–1.24)
GFR calc non Af Amer: 26 mL/min — ABNORMAL LOW (ref >60)
GFR, EST AFRICAN AMERICAN: 30 mL/min — AB (ref >60)
Glucose, Bld: 417 mg/dL — ABNORMAL HIGH (ref 65–99)
Potassium: 4.4 mmol/L (ref 3.5–5.1)
SODIUM: 132 mmol/L — AB (ref 135–145)

## 2015-04-10 LAB — GLUCOSE, CAPILLARY
GLUCOSE-CAPILLARY: 372 mg/dL — AB (ref 65–99)
GLUCOSE-CAPILLARY: 133 mg/dL — AB (ref 65–99)
Glucose-Capillary: 387 mg/dL — ABNORMAL HIGH (ref 65–99)
Glucose-Capillary: 141 mg/dL — ABNORMAL HIGH (ref 65–99)

## 2015-04-10 LAB — CBC
HCT: 33.3 % — ABNORMAL LOW (ref 39.0–52.0)
HEMOGLOBIN: 10.6 g/dL — AB (ref 13.0–17.0)
MCH: 28.6 pg (ref 26.0–34.0)
MCHC: 31.8 g/dL (ref 30.0–36.0)
MCV: 90 fL (ref 78.0–100.0)
Platelets: 166 10*3/uL (ref 150–400)
RBC: 3.7 MIL/uL — ABNORMAL LOW (ref 4.22–5.81)
RDW: 12.7 % (ref 11.5–15.5)
WBC: 6.1 10*3/uL (ref 4.0–10.5)

## 2015-04-10 LAB — PROTIME-INR
INR: 2.97 — AB (ref 0.00–1.49)
PROTHROMBIN TIME: 30.4 s — AB (ref 11.6–15.2)

## 2015-04-10 MED ORDER — METOLAZONE 5 MG PO TABS
5.0000 mg | ORAL_TABLET | Freq: Every day | ORAL | Status: DC
  Administered 2015-04-10 – 2015-04-14 (×5): 5 mg via ORAL
  Filled 2015-04-10 (×5): qty 1

## 2015-04-10 MED ORDER — WARFARIN SODIUM 2.5 MG PO TABS
2.5000 mg | ORAL_TABLET | Freq: Once | ORAL | Status: AC
  Administered 2015-04-10: 2.5 mg via ORAL
  Filled 2015-04-10: qty 1

## 2015-04-10 MED ORDER — INSULIN GLARGINE 100 UNIT/ML ~~LOC~~ SOLN
15.0000 [IU] | Freq: Every morning | SUBCUTANEOUS | Status: DC
  Administered 2015-04-10 – 2015-04-12 (×3): 15 [IU] via SUBCUTANEOUS
  Filled 2015-04-10 (×4): qty 0.15

## 2015-04-10 NOTE — Progress Notes (Signed)
       Patient Name: Tyler [name] Date of Encounter: 04/10/2015    SUBJECTIVE: Uncomfortable. He feels his abdomen has not decreased in size. Mild shortness of breath. Lower extremity edema has improved.  TELEMETRY:  Paced rhythm Filed Vitals:   04/09/15 2019 04/10/15 0353 04/10/15 0900 04/10/15 1230  BP: 163/48 177/54 145/70 141/45  Pulse: 66 66 69 63  Temp: 98.3 F (36.8 C) 97.5 F (36.4 C) 98.3 F (36.8 C) 98.1 F (36.7 C)  TempSrc: Oral Oral  Oral  Resp: Height:      Weight:  124.921 kg (275 lb 6.4 oz)    SpO2: 100% 98% 99% 95%    Intake/Output Summary (Last 24 hours) at 04/10/15 1248 Last data filed at 04/10/15 1008  Gross per 24 hour  Intake   1030 ml  Output   1950 ml  Net   -920 ml   LABS: Basic Metabolic Panel:  Recent Labs  78/29/56 0426 04/10/15 0420  NA 135 132*  K 4.4 4.4  CL 95* 91*  CO2 32 32  GLUCOSE 251* 417*  BUN 34* 38*  CREATININE 2.13* 2.26*  CALCIUM 8.6* 8.6*   CBC:  Recent Labs  04/09/15 0426 04/10/15 0420  WBC 7.1 6.1  HGB 10.5* 10.6*  HCT 32.4* 33.3*  MCV 91.0 90.0  PLT 162 166     Radiology/Studies:  Chest x-ray on 914/2016 does not reveal any evidence of pulmonary congestion. Cardiac enlargement is noted.  Physical Exam: Blood pressure 141/45, pulse 63, temperature 98.1 F (36.7 C), temperature source Oral, resp. rate 19, height  (1.778 m), weight 124.921 kg (275 lb 6.4 oz), SpO2 95 %. Weight change: 2.495 kg (5 lb 8 oz)  Wt Readings from Last 3 Encounters:  04/10/15 124.921 kg (275 lb 6.4 oz)  03/17/15 126.599 kg (279 lb 1.6 oz)  12/28/14 124.921 kg (275 lb 6.4 oz)   Relatively tense abdomen with tympany Neck veins are difficult to interpret. He is sitting at 70. External's are not obviously elevated. Chest is clear anteriorly Cardiac exam reveals no murmur or gallop  ASSESSMENT: 1. Acute on chronic diastolic heart failure with greater than 3 L diuresis since admission to the  hospital 2. Acute on chronic kidney disease with increasing azotemia and creatinine since admission 3. Ogilvie's syndrome with abdominal distention and apparently no ascites 4. Obstructive sleep apnea with obesity hypoventilation syndrome 5. Paroxysmal atrial fibrillation currently in paced rhythm 6. Blood pressure is adequate  Plan:  Continue IV diuresis one more day. Metolazone 5 mg today Recheck BNP   Signed, Lesleigh Noe 04/10/2015, 12:48 PM

## 2015-04-10 NOTE — Progress Notes (Signed)
ANTICOAGULATION CONSULT NOTE - Follow Up Consult  Pharmacy Consult for Coumadin Indication: atrial fibrillation   Patient Measurements: Height:  (177.8 cm) Weight: 275 lb 6.4 oz (124.921 kg) IBW/kg (Calculated) : 73  Vital Signs: Temp: 98.3 F (36.8 C) (09/17 0900) Temp Source: Oral (09/17 0353) BP: 145/70 mmHg (09/17 0900) Pulse Rate: 69 (09/17 0900)  Labs:  Recent Labs  04/08/15 0345 04/09/15 0426 04/10/15 0420  HGB 9.9* 10.5* 10.6*  HCT 32.3* 32.4* 33.3*  PLT 151 162 166  LABPROT 27.2* 28.8* 30.4*  INR 2.57* 2.76* 2.97*  CREATININE 2.35* 2.13* 2.26*    Estimated Creatinine Clearance: 35.2 mL/min (by C-G formula based on Cr of 2.26).  Assessment: 81yo male with AFib, INR therapeutic/stable on home dose of Coumadin.  Pt is on Amiodarone (new) which may increase INR.  Hg with slight increase and pltc wnl.  No bleeding noted.    INR trending up this am to 2.97. Will likely need a  a couple of times a week on this drug regimen alternating with 7.5. Will give lower dose tonight.  Goal of Therapy:  INR 2-3 Monitor platelets by anticoagulation protocol: Yes   Plan:  Low Coumadin today and give 2.5mg   Continue daily INR to monitor for potential drug interactions Watch for s/s of bleeding  Celedonio Miyamoto, PharmD, BCPS-AQ ID Clinical Pharmacist Pager [phone]   04/10/2015 10:45 AM

## 2015-04-10 NOTE — Progress Notes (Signed)
Family Medicine Teaching Service Daily Progress Note Intern Pager: [phone]  Patient name: Tyler [name] Medical record number: [id number] Date of birth: 23-Nov-1933 Age: 81 y.o. Gender: male  Primary Care Provider: [name], MD Consultants: GI, Cardiology Code Status: FULL  Pt Overview and Major Events to Date:  9/12: Admitted for shortness of breath concerning for CHF exacerbation, Wt 291 9/13: Continued on Lasix drip. Wt 280lbs. Amiodarone started for PAF. 9/14: Continued Lasix drip. Wt 281lbs. 9/15: Continued Lasix drip, 1 dose Metolazone. 9/16: Wt down to 273lbs.   Assessment and Plan: Tyler [name] is a 82 y.o. male presenting with recurrent hypoxemic respiratory failure from acute CHF exacerbation. PMH is significant for HFpEF, HTN, HLD, T2DM, OSA, OHS, SSS and PAF s/p dual chamber pacer 2013.  Acute on Chronic Diastolic HF, HFpEF: Echo on most recent admission with EF 55-60%, G2DD. Wt 291>>273>275. 2.3L out - Heart Failure team following, appreciate recommendations - Per Heart Failure team, continue Lasix gtt @ 10 mg/hr today with metolazone - Daily weights, strict I/O, fluid restriction - Holding beta blocker given low suspicion for ACS and acute CHF exacerbation - Continue atorvastatin 40 mg PO daily - Could use the vest prior to discharge to verify full diuresis.  Stage III CKD: Presumed baseline ~2 Scr, most recently trended downward with diuresis but bumped this AM to 2.26.  - Continue to monitor  - Milk of Magnesia outpatient prn constipation treatment should be discontinued at discharge due to SCr persistently >2mg /dl.  Acute Hypoxemic Respiratory Failure: Multifactorial with volume overload and abdominal distention worsening pre-existing asthma, OHS, OSA, and 20 pack/year smoking history with likely undiagnosed COPD.  - Continue diuresis as above and try to wean oxygen by nasal cannula - Continue allergy/reactive airway therapy: flonase, loratadine,  singulair, formulary LABA/ICS, PRN albuterol - Duonebs q6h PRN for wheezing  - Patient will need CPAP on outpatient basis  Ogilvie's Syndrome: Very chronic problem with neg abd U/S, possibel adynamic ileus on KUB.  - BID Miralax - Continue simethicone and PPI - Hold anticholinergics on discharge  Rash: With history of cutaneous hypersensitivity to detergents, suspect gown as cause. Improved somewhat since changing gown. Doubt drug reaction.   Hypertension: Systolic above goal, though pulse pressure is consistently > 110 - Continue Norvasc 2.5 mg PO daily  - Hydralazine 10mg  IV q2h PRN for SBP > 170  Type II Diabetes Mellitus, Insulin-Dependent: Well, possibly too tightly, controlled - Hb A1c 6.6% on 9/8. Home dose is 27u qAM, 45u qPM + SSI. Recent CBGs climbing significantly with return of appetite - Add AM lantus 15u to 45u PM dose - Continue 5u TID AC + res SSI  Arrhythmias: Sick Sinus, Paroxysmal AFib/Flutter:  - On coumadin per pharmacy, amiodarone 200 mg BID started (9/13)  Chronic Pain: - Continue to hold narcotics as can be contributing to constipation - Discontinued home mobic given CHF - Tylenol PRN pain  Social: Patient is being treated for cognitive impairment. Mr. Russom daughter, [name], requests daily updates to which the patient has consented verbally.She had been told that definitive plans for the day can be relayed to her around 1:45 - 2:00pm daily after rounds and noon conference. - Continue donepezil  History of recurrent Klebsiella UTI due to Bladder outlet obstruction from BPH: No evidence of infection at this time with good UOP  - Low threshold for repeat U/A and bladder scan if symptoms recur or urinary retention suspected. Consider further urologic investigation as outpatient.  - Continue myrbetriq, flomax.  Hold cardura as redundant therapy. - Hold elavil for concern of worsening retention.  Chronic RLE wound: Followed at wound care clinic as  outpatient.  - WOC recommendations appreciated.  FEN/GI: Heart healthy/carb modified diet, saline lock IV Prophylaxis: Therapeutic coumadin  Disposition: Continue diuresis, may be stable for D/C 9/19 when she will be discharged back to [facility] due to daughters' adamant request (SNF is thought to be more ideal dispo).   Subjective: Continued abdominal distention, though eating 100% of meals with assistance. Shortness of breath has improved minimally. Denies chest pain, palpitations.   Objective: Temp:  [97.5 F (36.4 C)-98.6 F (37 C)] 97.5 F (36.4 C) (09/17 0353) Pulse Rate:  [59-66] 66 (09/17 0353) Resp:  [18-20] 18 (09/17 0353) BP: (163-177)/(48-54) 177/54 mmHg (09/17 0353) SpO2:  [93 %-100 %] 98 % (09/17 0353) Weight:  [275 lb 6.4 oz (124.921 kg)] 275 lb 6.4 oz (124.921 kg) (09/17 0353) Physical Exam: Gen: Pleasant elderly male CV: RRR, no murmur, 2+ JVD, 1+ LE edema Pulm: Nonlabored, CTAB Abd: + BS, Nontender but taut Neuro: Oriented, speech normal Skin: Areas of macular erythema on trunk   Intake/Output Summary (Last 24 hours) at 04/10/15 0913 Last data filed at 04/10/15 0700  Gross per 24 hour  Intake   1450 ml  Output   2300 ml  Net   -850 ml    Laboratory:  Recent Labs Lab 04/08/15 0345 04/09/15 0426 04/10/15 0420  WBC 6.8 7.1 6.1  HGB 9.9* 10.5* 10.6*  HCT 32.3* 32.4* 33.3*  PLT 151 162 166    Recent Labs Lab 04/05/15 1537  04/08/15 0345 04/09/15 0426 04/10/15 0420  NA 140  < > 133* 135 132*  K 5.2*  < > 4.8 4.4 4.4  CL 107  < > 97* 95* 91*  CO2 27  < > 26 32 32  BUN 34*  < > 38* 34* 38*  CREATININE 1.98*  < > 2.35* 2.13* 2.26*  CALCIUM 8.6*  < > 8.2* 8.6* 8.6*  PROT 7.8  --   --   --   --   BILITOT 0.6  --   --   --   --   ALKPHOS 105  --   --   --   --   ALT 17  --   --   --   --   AST 19  --   --   --   --   GLUCOSE 67  < > 388* 251* 417*  < > = values in this interval not displayed.  PT: 28.8 INR: 2.76   Tyrone Nine,  MD 04/10/2015, 9:13 AM PGY-3, [facility] FPTS Intern pager: [phone], text pages welcome

## 2015-04-10 NOTE — Discharge Summary (Signed)
Family Medicine Teaching [facility] Discharge Summary  Patient name: Tyler [name] Medical record number: [id number] Date of birth: 1934/01/07 Age: 81 y.o. 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 No LE edema Neuro: Oriented, speech normal  Brief Hospital Course:  Tyler [name] is a 81 year old male who presented with recurrent hypoxemic respiratory failure from acute CHF exacerbation likely due to holding diuretic due to renal impairment. Prior to this hospitalization, patient was discharged from hospital on 8/24 after treatment for an acute CHF exacerbation. Additionally, his chronic constipation and abdominal distention was also managed during his hospitalization, and he was found to have a UTI for which he was started on Keflex. Urine culture came back growing Klebsiella pneumoniae resistant to Keflex and he was switched to Ciprofloxacin 500mg  qd x 7 days.  Acute Hypoxemic Respiratory Failure:  Respiratory failure is likely multifactorial due to acute CHF exacerbation in addition to OSA, protuberant abdomen (Oglivie's Syndrome), and likely COPD component as patient has a 20 pack year history although he has not  been officially diagnosed with this. Patient was initially on 6L O2 via [place] which was weaned down to room air. Patient was continued on allergy and asthma medications including Flonase, Loratidine, Singulair, formulary LABA/ICS, and PRN albuterol. Heart Failure team recommend follow up with pulmonary sleep medicine at discharge.  Acute on Chronic HFpEF: On admission, pro-BNP was elevated at 317. His CXR showed stable cardiomegaly. Troponins were negative x2 on admission. Per chart review, his weight was noted to be 279lb on discharge on 8/24. On admission, he was 291lbs. His home Metoprolol was held due to HR in 59-60, but was then restarted on 9/27 at a lower dose of 12.5mg  bid. His ECHO on previous admission in August showed EF 55-60% and Grade 2 Diastolic Dysfunction. Cardiology was consulted. Patient was diuresed with IV lasix drip and was fluid restricted. Metolazone was added to IV lasix drip regimen per heart failure team. Patient had good urine output and decrease in his weight.  Patient was eventually transitioned to oral diuretic regimen, Torsemide. However Torsemide was held for multiple days due to AKI. Torsemide was restarted at 50mg  daily per HF. PT/OT was consulted during hospitalization who recommended skilled nursing facility for patient needs; family was agreeable.   Sick Sinus Syndrome and Atrial Fibrillation/Flutter with dual chamber pacer in situ: Patient's coumadin was continued during hospital stay. Per heart failure team, patient was started on Amiodarone BID. Per Heart Failure team, his Amiodarone 200mg  BID was decreased to 200mg  daily. INR was elevated throughout admission. INR was 3.29 on the day of discharge, so Coumadin was held. Our pharmacist recommended frequent INR checks and restarting Coumadin at 50% of his previous dose (3-4mg ).   Hyperkalemia:  Potassium was mildly elevated on admission at 5.2. This was monitored and improved with diuresis. K was 3.9 on  the day of  discharge.  AKI on Stage III CKD: On admission patient's SCr noted to be 1.98. Cr increased in the setting of aggressive diuresis up to 2.99. This was closely monitored and diuresis was tailored accordingly. PO lasix was restarted after improvement. Patient's milk of magnesia medication as outpatient was discontinued at discharge due to SCr persistently greater than 2mg /dl.   T2DM: Per chart review, patient's home regimen included Lantus 27u q AM and 45u q PM. Patient was initially started on Lantus 35u q AM with resistant SSI. CBGs were monitored and insulin regimen was tailored as appropriate. Neurontin 100mg  TID was continued. Pt was discharged on Lantus 40 units in the morning and Novolog 10 units tid with meals.  Ogilivie's Syndrome, chronic and longstanding:  This was noted to be a chronic issue and had been present for over 1 year. Abdominal US at previous admission in August was unremarkable. Prior to admission, patient had daily soft bowel movements with daily Miralax. However, patient had issues with constipation during hospital stay. KUB was notable for possible adynamic ileus likely secondary to longstanding GI immotility exacerbated by use of anticholinergic medication. Anticholinergic medications including amitryptiline was discontinued. Home norco was held. Patient received daily Miralax and Senokot for bowel regimen. GI was consulted for further recommendations, and felt that patient initially did not need NG tube or colonic decompression as there were no signs or symptoms of obstruction. IV erythromycin which was started earlier during admission was discontinued per GI recommendations. Patient received SMOG enema with 3 large bowel movements and decreased abdominal distension. Simethicone was started for likely gas pains. Due to no significant improvement, GI completed flex sig with decompression with rectal tube placement; rectal tube was eventually discontinued.  Patient had BM when given  enemas. Patient was started on a daily suppository and continued on Miralax and Senokot. At one point, patient was placed on NPO and his diet was slowly advanced. Patient denied nausea or vomiting on the day of discharge and was able to tolerate a soft diet.  HTN: Blood pressures were intermittently elevated, therefore patient was started on Norvasc 2.5mg  PO daily.  Chronic Pain: Patient's home narcotics were held to avoid worsening of constipation. Home mobic was discontinued due to CHF. Pain was managed with Tylenol PRN.  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 Patient did not have evidence of infection during hospital stay. Patient continued to have good urine output during hospital stay. He was continued on home Myrbetriq. Flomax and Cardura were held due to redundant therapy. Elavil was held for concern of worsening retention.    Chronic RLE wound and left great toe wound:  Patient was followed at wound care clinic as outpatient. Wound care was consulted during admission for management of wound. Per wound care team patient was continued on similar antimicrobial product as used as outpatient in addition to Alginate; right leg wound was ace wrapped for light compression. Per team, patient can resume previous plan of care after discharge and follow up with podiatry and wound care center.   Issues for Follow Up:  1. Per Heart Failure, Amiodarone 200mg  BID was started due to frequent AFib found on pacer interrogation, then decreased Amiodarone 200mg  daily which patient is to continue: Recommend regular monitoring CXR, thyroid studies, and LFTs. Additionally, monitor INR closely as INR has been elevated due to Amiodarone 2. Urate elevated during admission to 11, thiazide contributing, though consider addition  of losartan and/or allopurinol for gout flare prophylaxis. 3. Milk of Magnesia outpatient prn constipation treatment  was discontinued at discharge due to SCr persistently >2mg /dl. Pt should receive a soap suds enema 3-4 times per week to ensure regular bowel movements, as well as continuing  4. Per Heart Failure recommend sleep study for suspected OSA/OHA 5. Recommend not restarting anticholinergics after discharge due to chronic constipation and abdominal distension.  6. Cardura was held as redundant therapy with flomax, consider titrating flomax dose up.  7. Pt was found to have a UTI that was treated with Keflex. Urine culture grew Klebsiella pneumoniae that was resistant to Keflex. Patient was switched to Ciprofloxacin 500mg  qd on 9/28 and should continue this for 7 days.  Significant Procedures: None  Significant Labs and Imaging:   Recent Labs Lab 04/19/15 0719 04/20/15 0348  WBC 7.4 8.6  HGB 11.4* 11.9*  HCT 36.1* 36.9*  PLT 220 238    Recent Labs Lab 04/17/15 0410 04/18/15 0321 04/19/15 0257 04/20/15 0348 04/21/15 0718 04/21/15 0900  NA 134* 136 139 138  --  134*  K 3.7 4.2 3.9 3.8  --  3.9  CL 93* 95* 99* 96*  --  95*  CO2 32 32 32 31  --  32  GLUCOSE 217* 404* 160* 136*  --  244*  BUN 46* 43* 35* 35*  --  38*  CREATININE 2.50* 2.52* 2.33* 2.35*  --  2.52*  CALCIUM 8.3* 8.6* 8.7* 8.7*  --  8.3*  ALKPHOS  --   --   --  107 107  --   AST  --   --   --  39 39  --   ALT  --   --   --  62 55  --   ALBUMIN  --   --   --  3.0* 3.1*  --    PT 32.8, INR 3.29  CXR (9/12): Persistent cardiomegaly and small left pleural effusion. No frank edema or consolidation. No appreciable change compared to recent prior study. AXR (9/13): Cannot exclude mild adynamic ileus. Exam otherwise unremarkable. CXR (9/14): No active cardiopulmonary disease AXR (9/21): Chronic distention of the colon, increased in the mid sigmoid region as compared to prior exams. Cecum does not appear dilated. Appearance is not suggestive of sigmoid volvulus. AXR (9/22): Decompression of the colon after rectal tube placement  with moderate gaseous distention of the sigmoid colon persisting. No evidence of bowel obstruction or free intraperitoneal air. AXR (9/23): Air-filled dilated colon is noted most consistent with ileus. AXR (9/24): Unchanged diffuse gaseous distention of the colon suggestive of ileus. No definite small bowel dilatation.   Results/Tests Pending at Time of Discharge: None  Discharge Medications:    Medication List    STOP taking these medications        doxazosin 2 MG tablet  Commonly known as:  CARDURA     metoCLOPramide 10 MG tablet  Commonly known as:  REGLAN     simvastatin 40 MG tablet  Commonly known as:  ZOCOR      TAKE these medications        amiodarone 200 MG tablet  Commonly known as:  PACERONE  Take 1 tablet (200 mg total) by mouth daily.     amitriptyline 25 MG tablet  Commonly known as:  ELAVIL  Take 25 mg by mouth at bedtime.     atorvastatin 40 MG tablet  Commonly known as:  LIPITOR  Take 1 tablet (40 mg  total) by mouth daily at 6 PM.     ciprofloxacin 500 MG tablet  Commonly known as:  CIPRO  Take 1 tablet (500 mg total) by mouth daily. For a total of 7 days.     DAILY VITE Tabs  Take by mouth daily.     donepezil 10 MG tablet  Commonly known as:  ARICEPT  Take 10 mg by mouth at bedtime.     ferrous sulfate 325 (65 FE) MG tablet  Take 325 mg by mouth daily with breakfast.     fluticasone 50 MCG/ACT nasal spray  Commonly known as:  FLONASE  Place 2 sprays into both nostrils daily.     Fluticasone-Salmeterol 100-50 MCG/DOSE Aepb  Commonly known as:  ADVAIR  Inhale 1 puff into the lungs 2 (two) times daily. Rinse mouth and spit after each use     gabapentin 100 MG capsule  Commonly known as:  NEURONTIN  Take 100 mg by mouth 3 (three) times daily.     HYDROcodone-acetaminophen 5-325 MG tablet  Commonly known as:  NORCO/VICODIN  Take 1 tablet by mouth every 8 (eight) hours as needed for severe pain.     insulin aspart 100 UNIT/ML injection   Commonly known as:  novoLOG  Inject into the skin 3 (three) times daily before meals. 10 units before breakfast and lunch , 12units before dinner along with sliding scale     insulin glargine 100 UNIT/ML injection  Commonly known as:  LANTUS  Inject 0.4 mLs (40 Units total) into the skin every morning.     loratadine 10 MG tablet  Commonly known as:  CLARITIN  Take 10 mg by mouth daily.     metoprolol tartrate 25 MG tablet  Commonly known as:  LOPRESSOR  Take 0.5 tablets (12.5 mg total) by mouth 2 (two) times daily.     mirabegron ER 50 MG Tb24 tablet  Commonly known as:  MYRBETRIQ  Take 50 mg by mouth daily.     montelukast 10 MG tablet  Commonly known as:  SINGULAIR  Take 10 mg by mouth at bedtime.     omeprazole 40 MG capsule  Commonly known as:  PRILOSEC  Take 40 mg by mouth daily.     polyethylene glycol packet  Commonly known as:  MIRALAX / GLYCOLAX  Take 17 g by mouth 2 (two) times daily. Take on Mon.. Wed. and Fri.     PREVALON HEEL PROTECTOR Misc  Apply the boot to the left foot to protect the heel at night and during the day when patient is in his chair     ranitidine 150 MG capsule  Commonly known as:  ZANTAC  Take 150 mg by mouth 2 (two) times daily.     senna 8.6 MG Tabs tablet  Commonly known as:  SENOKOT  Take 1 tablet (8.6 mg total) by mouth 2 (two) times daily.     sertraline 25 MG tablet  Commonly known as:  ZOLOFT  Take 25 mg by mouth daily.     simethicone 80 MG chewable tablet  Commonly known as:  MYLICON  Chew 1 tablet (80 mg total) by mouth 4 (four) times daily.     tamsulosin 0.4 MG Caps capsule  Commonly known as:  FLOMAX  Take 0.4 mg by mouth daily.     torsemide 100 MG tablet  Commonly known as:  DEMADEX  Take 50 mg by mouth daily.     VENTOLIN HFA 108 (90 BASE) MCG/ACT inhaler  Generic drug:  albuterol  Inhale 2 puffs into the lungs every 4 (four) hours as needed. For shortness of breath     vitamin C 500 MG tablet  Commonly  known as:  ASCORBIC ACID  Take 500 mg by mouth daily.     Vitamin D (Ergocalciferol) 50000 UNITS Caps capsule  Commonly known as:  DRISDOL  Take 50,000 Units by mouth every 7 (seven) days. Fridays     warfarin 3 MG tablet  Commonly known as:  COUMADIN  Take 1 tablet (3 mg total) by mouth daily. Do NOT take on discharge. May restart 3mg  after INR returns to normal reference range.        Discharge Instructions: Please refer to Patient Instructions section of EMR for full details.  Patient was counseled important signs and symptoms that should prompt return to medical care, changes in medications, dietary instructions, activity restrictions, and follow up appointments.   Follow-Up Appointments: Follow-up Information    Follow up with Marca Ancona, MD On 05/03/2015.   Specialty:  Cardiology   Why:  at 1020 am for post hospital follow up. Please bring all of your medication with you to your visit. Please call the office for the patient parking code.   Contact information:   [address] [place] [address] [phone]       Follow up with [name], MD. Go on 04/29/2015.   Specialty:  Family Medicine   Why:  3:45pm hospital followup   Contact information:   [address] [place] [address] [phone]       Campbell Stall, MD 04/21/2015, 3:24 PM PGY-1, [facility]

## 2015-04-11 DIAGNOSIS — K5901 Slow transit constipation: Secondary | ICD-10-CM

## 2015-04-11 DIAGNOSIS — N184 Chronic kidney disease, stage 4 (severe): Secondary | ICD-10-CM

## 2015-04-11 DIAGNOSIS — N179 Acute kidney failure, unspecified: Secondary | ICD-10-CM

## 2015-04-11 DIAGNOSIS — I5032 Chronic diastolic (congestive) heart failure: Secondary | ICD-10-CM

## 2015-04-11 DIAGNOSIS — I5033 Acute on chronic diastolic (congestive) heart failure: Secondary | ICD-10-CM

## 2015-04-11 LAB — CBC
HCT: 34.5 % — ABNORMAL LOW (ref 39.0–52.0)
Hemoglobin: 11.1 g/dL — ABNORMAL LOW (ref 13.0–17.0)
MCH: 29.1 pg (ref 26.0–34.0)
MCHC: 32.2 g/dL (ref 30.0–36.0)
MCV: 90.3 fL (ref 78.0–100.0)
PLATELETS: 175 10*3/uL (ref 150–400)
RBC: 3.82 MIL/uL — AB (ref 4.22–5.81)
RDW: 12.8 % (ref 11.5–15.5)
WBC: 6.3 10*3/uL (ref 4.0–10.5)

## 2015-04-11 LAB — GLUCOSE, CAPILLARY
GLUCOSE-CAPILLARY: 213 mg/dL — AB (ref 65–99)
Glucose-Capillary: 220 mg/dL — ABNORMAL HIGH (ref 65–99)
Glucose-Capillary: 215 mg/dL — ABNORMAL HIGH (ref 65–99)
Glucose-Capillary: 206 mg/dL — ABNORMAL HIGH (ref 65–99)

## 2015-04-11 LAB — BASIC METABOLIC PANEL
ANION GAP: 11 (ref 5–15)
BUN: 34 mg/dL — AB (ref 6–20)
CALCIUM: 9.1 mg/dL (ref 8.9–10.3)
CO2: 36 mmol/L — ABNORMAL HIGH (ref 22–32)
Chloride: 91 mmol/L — ABNORMAL LOW (ref 101–111)
Creatinine, Ser: 2.37 mg/dL — ABNORMAL HIGH (ref 0.61–1.24)
GFR calc Af Amer: 28 mL/min — ABNORMAL LOW (ref >60)
GFR, EST NON AFRICAN AMERICAN: 24 mL/min — AB (ref >60)
GLUCOSE: 196 mg/dL — AB (ref 65–99)
POTASSIUM: 3.9 mmol/L (ref 3.5–5.1)
SODIUM: 138 mmol/L (ref 135–145)

## 2015-04-11 LAB — PROTIME-INR
INR: 2.8 — ABNORMAL HIGH (ref 0.00–1.49)
PROTHROMBIN TIME: 29.1 s — AB (ref 11.6–15.2)

## 2015-04-11 LAB — BRAIN NATRIURETIC PEPTIDE: B Natriuretic Peptide: 53.9 pg/mL (ref 0.0–100.0)

## 2015-04-11 MED ORDER — WARFARIN SODIUM 5 MG PO TABS
5.0000 mg | ORAL_TABLET | Freq: Once | ORAL | Status: AC
  Administered 2015-04-11: 5 mg via ORAL
  Filled 2015-04-11: qty 1

## 2015-04-11 MED ORDER — TORSEMIDE 20 MG PO TABS
80.0000 mg | ORAL_TABLET | Freq: Every day | ORAL | Status: DC
  Administered 2015-04-11: 80 mg via ORAL
  Filled 2015-04-11: qty 4

## 2015-04-11 NOTE — Progress Notes (Signed)
ANTICOAGULATION CONSULT NOTE - Follow Up Consult  Pharmacy Consult for Coumadin Indication: atrial fibrillation   Patient Measurements: Height:  (177.8 cm) Weight: 268 lb 4.8 oz (121.7 kg) IBW/kg (Calculated) : 73  Vital Signs: Temp: 98.3 F (36.8 C) (09/18 0950) Temp Source: Oral (09/18 0950) BP: 138/47 mmHg (09/18 0953) Pulse Rate: 66 (09/18 0400)  Labs:  Recent Labs  04/09/15 0426 04/10/15 0420 04/11/15 0444  HGB 10.5* 10.6* 11.1*  HCT 32.4* 33.3* 34.5*  PLT 162 166 175  LABPROT 28.8* 30.4* 29.1*  INR 2.76* 2.97* 2.80*  CREATININE 2.13* 2.26* 2.37*    Estimated Creatinine Clearance: 33.1 mL/min (by C-G formula based on Cr of 2.37).  Assessment: 81yo male with AFib, INR therapeutic/stable on home dose of Coumadin.  Pt is on Amiodarone (new) which may increase INR.  Hg with slight increase and pltc wnl.  No bleeding noted.    INR therapeutic at 2.8. Will likely need a  a couple of times a week on this drug regimen alternating with 7.5.   Goal of Therapy:  INR 2-3 Monitor platelets by anticoagulation protocol: Yes   Plan:  Coumadin  x 1 dose Continue daily INR to monitor for potential drug interactions Watch for s/s of bleeding  Celedonio Miyamoto, PharmD, BCPS-AQ ID Clinical Pharmacist Pager [phone]   04/11/2015 11:35 AM

## 2015-04-11 NOTE — Progress Notes (Signed)
       Patient Name: Tyler [name] Date of Encounter: 04/11/2015    SUBJECTIVE: Still concerned about abdominal distention. Breathing is better.  TELEMETRY:  No longer on telemetry Filed Vitals:   04/11/15 0400 04/11/15 0848 04/11/15 0950 04/11/15 0953  BP: 147/70  138/47 138/47  Pulse: 66     Temp: 98 F (36.7 C)  98.3 F (36.8 C)   TempSrc: Oral  Oral   Resp: 18  16   Height:      Weight: 121.7 kg (268 lb 4.8 oz)     SpO2: 98% 98% 89% 97%    Intake/Output Summary (Last 24 hours) at 04/11/15 1159 Last data filed at 04/11/15 1104  Gross per 24 hour  Intake   1220 ml  Output   2750 ml  Net  -1530 ml   LABS: Basic Metabolic Panel:  Recent Labs  16/10/96 0420 04/11/15 0444  NA 132* 138  K 4.4 3.9  CL 91* 91*  CO2 32 36*  GLUCOSE 417* 196*  BUN 38* 34*  CREATININE 2.26* 2.37*  CALCIUM 8.6* 9.1   CBC:  Recent Labs  04/10/15 0420 04/11/15 0444  WBC 6.1 6.3  HGB 10.6* 11.1*  HCT 33.3* 34.5*  MCV 90.0 90.3  PLT 166 175   BNP    Component Value Date/Time   BNP 53.9 04/11/2015 1020    ProBNP    Component Value Date/Time   PROBNP 48.1 01/13/2007 2320     Radiology/Studies:  No recent data  Physical Exam: Blood pressure 138/47, pulse 66, temperature 98.3 F (36.8 C), temperature source Oral, resp. rate 16, height  (1.778 m), weight 121.7 kg (268 lb 4.8 oz), SpO2 97 %. Weight change: -2.449 kg (-5 lb 6.4 oz)  Wt Readings from Last 3 Encounters:  04/11/15 121.7 kg (268 lb 4.8 oz)  03/17/15 126.599 kg (279 lb 1.6 oz)  12/28/14 124.921 kg (275 lb 6.4 oz)   Sitting in chair watching the football game. No respiratory distress Neck veins are not visible Abdomen is tympanitic Cardiac exam reveals a regular rhythm  ASSESSMENT: 1. Acute on chronic diastolic heart failure with 7.5 L diuresis since admission 2. Acute on chronic kidney injury with increasing creatinine 3. No longer on monitor. Heart rate is controlled. Assume paced rhythms  and his heart rate near 70.  Plan:  1. Discontinue IV Lasix 2. Resume oral diuretic regimen 3. Torsemide 80 mg per day starting in a.m. 4. Basic metabolic panel in a.m.  Selinda Eon 04/11/2015, 11:59 AM

## 2015-04-11 NOTE — Progress Notes (Signed)
Family Medicine Teaching Service Daily Progress Note Intern Pager: [phone]  Patient name: Tyler [name] Medical record number: [id number] Date of birth: November 10, 1933 Age: 81 y.o. Gender: male  Primary Care Provider: [name], MD Consultants: GI, Cardiology Code Status: FULL  Pt Overview and Major Events to Date:  9/12: Admitted for shortness of breath concerning for CHF exacerbation, Wt 291 9/13: Continued on Lasix drip. Wt 280lbs. Amiodarone started for PAF. 9/14: Continued Lasix drip. Wt 281lbs. 9/15: Continued Lasix drip, 1 dose Metolazone. 9/16: Wt down to 273lbs.  9/18: Wt down to 275 lbs. Though Creatinine creeping up. Constipation continues.   Assessment and Plan: Tyler [name] is a 81 y.o. male presenting with recurrent hypoxemic respiratory failure from acute CHF exacerbation. PMH is significant for HFpEF, HTN, HLD, T2DM, OSA, OHS, SSS and PAF s/p dual chamber pacer 2013.  Acute on Chronic Diastolic HF, HFpEF: Echo on most recent admission with EF 55-60%, G2DD. Wt 291>>275 > 268. Net negative 7L, UOP slowed overnight though - 1L for the past 24 hours. Concern given rising creatinine. He has some degree of cardiorenal syndrome.  - Heart Failure team following, appreciate recommendations - Per Heart Failure team f/u recs regarding Lasix gtt and metolazone.  - Daily weights, strict I/O, fluid restriction - Holding beta blocker given low suspicion for ACS and acute CHF exacerbation - Continue atorvastatin 40 mg PO daily - Could use the vest prior to discharge to verify full diuresis.  Stage III CKD: Presumed baseline ~2 Scr, most recently trended downward with diuresis but has been steadily creeping back up. He is diuresing, but concern for cardiorenal syndrome given this trend. Cr 2.37 - Continue to monitor  - Milk of Magnesia outpatient prn constipation treatment should be discontinued at discharge due to SCr persistently >2mg /dl.  Acute Hypoxemic Respiratory  Failure: Multifactorial with volume overload and abdominal distention worsening pre-existing asthma, OHS, OSA, and 20 pack/year smoking history with likely undiagnosed COPD. Required O2 2L initially, now off O2. Will continue to follow.  - Continue diuresis as above.  - O2 prn.  - Continue allergy/reactive airway therapy: flonase, loratadine, singulair, formulary LABA/ICS, PRN albuterol - Duonebs q6h PRN for wheezing  - Patient will need CPAP on outpatient basis  Ogilvie's Syndrome: Very chronic problem with neg abd U/S, possibel adynamic ileus on KUB.  - BID Miralax - Consider enema again today to relieve this.  - Continue simethicone and PPI - Hold anticholinergics on discharge  Rash: With history of cutaneous hypersensitivity to detergents, suspect gown as cause. Improved somewhat since changing gown. No rash today.  - Continue to follow.    Hypertension: Blood pressure in range.  - Continue Norvasc 2.5 mg PO daily  - Hydralazine  IV q2h PRN for SBP > 170. Would avoid this if possible given the possibility of cardiorenal syndrome.   Type II Diabetes Mellitus, Insulin-Dependent: Well, possibly too tightly, controlled - Hb A1c 6.6% on 9/8. Home dose is 27u qAM, 45u qPM + SSI. Recent CBGs climbing significantly with return of appetite - Add AM lantus 15u to 45u PM dose - Continue 5u TID AC + res SSI  Arrhythmias: Sick Sinus, Paroxysmal AFib/Flutter:  - On coumadin per pharmacy, amiodarone 200 mg BID started (9/13)  Chronic Pain: - Continue to hold narcotics as can be contributing to constipation - Discontinued home mobic given CHF - Tylenol PRN pain  Social: Patient is being treated for cognitive impairment. Mr. [place] daughter, [name], requests daily updates to which the patient  has consented verbally.She had been told that definitive plans for the day can be relayed to her around 1:45 - 2:00pm daily after rounds and noon conference. - Continue donepezil - needs to go to  SNF. Patient agrees.   History of recurrent Klebsiella UTI due to Bladder outlet obstruction from BPH: No evidence of infection at this time with good UOP  - Low threshold for repeat U/A and bladder scan if symptoms recur or urinary retention suspected. Consider further urologic investigation as outpatient.  - Continue myrbetriq, flomax. Hold cardura as redundant therapy. - Hold elavil for concern of worsening retention.  Chronic RLE wound: Followed at wound care clinic as outpatient.  - WOC recommendations appreciated.  FEN/GI: Heart healthy/carb modified diet, saline lock IV Prophylaxis: Therapeutic coumadin  Disposition: Continue diuresis, may be stable for D/C 9/19 when she will be discharged back to [facility] due to daughters' adamant request (SNF is thought to be more ideal dispo).   Subjective: Feeling better overall. Abdomen remains tight, but he says this is his baseline. Pt. Says that he feels his breathing is better. He is not on O2 while I am in the room.   Objective: Temp:  [97.6 F (36.4 C)-98.1 F (36.7 C)] 98 F (36.7 C) (09/18 0400) Pulse Rate:  [63-73] 66 (09/18 0400) Resp:  [18-19] 18 (09/18 0400) BP: (141-147)/(45-70) 147/70 mmHg (09/18 0400) SpO2:  [95 %-98 %] 98 % (09/18 0848) Weight:  [268 lb 4.8 oz (121.7 kg)] 268 lb 4.8 oz (121.7 kg) (09/18 0400) Physical Exam: Gen: Pleasant elderly male CV: RRR, no murmur, 2+ JVD, Trace LE edema today.  Pulm: Nonlabored, Slight crackles in the bases bilaterally.  Abd: + BS, Nontender but taut, No edema.  Neuro: Oriented, speech normal Skin: Macular erythema improving.    Intake/Output Summary (Last 24 hours) at 04/11/15 0946 Last data filed at 04/11/15 0849  Gross per 24 hour  Intake   1100 ml  Output   2610 ml  Net  -1510 ml    Laboratory:  Recent Labs Lab 04/09/15 0426 04/10/15 0420 04/11/15 0444  WBC 7.1 6.1 6.3  HGB 10.5* 10.6* 11.1*  HCT 32.4* 33.3* 34.5*  PLT 162 166 175    Recent  Labs Lab 04/05/15 1537  04/09/15 0426 04/10/15 0420 04/11/15 0444  NA 140  < > 135 132* 138  K 5.2*  < > 4.4 4.4 3.9  CL 107  < > 95* 91* 91*  CO2 27  < > 32 32 36*  BUN 34*  < > 34* 38* 34*  CREATININE 1.98*  < > 2.13* 2.26* 2.37*  CALCIUM 8.6*  < > 8.6* 8.6* 9.1  PROT 7.8  --   --   --   --   BILITOT 0.6  --   --   --   --   ALKPHOS 105  --   --   --   --   ALT 17  --   --   --   --   AST 19  --   --   --   --   GLUCOSE 67  < > 251* 417* 196*  < > = values in this interval not displayed.  PT: 29.1 INR: 2.80   Yolande Jolly, MD 04/11/2015, 9:46 AM PGY-3, [facility] Family Medicine Res[facility] FPTS Intern pager: [phone], text pages welcome

## 2015-04-12 ENCOUNTER — Encounter: Payer: Medicare Other | Admitting: *Deleted

## 2015-04-12 ENCOUNTER — Telehealth: Payer: Self-pay | Admitting: Cardiology

## 2015-04-12 DIAGNOSIS — K5669 Other intestinal obstruction: Secondary | ICD-10-CM

## 2015-04-12 LAB — GLUCOSE, CAPILLARY
GLUCOSE-CAPILLARY: 321 mg/dL — AB (ref 65–99)
GLUCOSE-CAPILLARY: 193 mg/dL — AB (ref 65–99)
Glucose-Capillary: 307 mg/dL — ABNORMAL HIGH (ref 65–99)
Glucose-Capillary: 205 mg/dL — ABNORMAL HIGH (ref 65–99)

## 2015-04-12 LAB — BASIC METABOLIC PANEL
Anion gap: 11 (ref 5–15)
BUN: 41 mg/dL — AB (ref 6–20)
CHLORIDE: 84 mmol/L — AB (ref 101–111)
CO2: 37 mmol/L — ABNORMAL HIGH (ref 22–32)
CREATININE: 2.63 mg/dL — AB (ref 0.61–1.24)
Calcium: 9 mg/dL (ref 8.9–10.3)
GFR calc Af Amer: 25 mL/min — ABNORMAL LOW (ref >60)
GFR calc non Af Amer: 22 mL/min — ABNORMAL LOW (ref >60)
Glucose, Bld: 293 mg/dL — ABNORMAL HIGH (ref 65–99)
Potassium: 3.9 mmol/L (ref 3.5–5.1)
SODIUM: 132 mmol/L — AB (ref 135–145)

## 2015-04-12 LAB — PROTIME-INR
INR: 2.37 — AB (ref 0.00–1.49)
PROTHROMBIN TIME: 25.6 s — AB (ref 11.6–15.2)

## 2015-04-12 MED ORDER — INSULIN GLARGINE 100 UNIT/ML ~~LOC~~ SOLN
25.0000 [IU] | Freq: Every morning | SUBCUTANEOUS | Status: DC
  Administered 2015-04-13 – 2015-04-18 (×6): 25 [IU] via SUBCUTANEOUS
  Filled 2015-04-12 (×8): qty 0.25

## 2015-04-12 MED ORDER — TORSEMIDE 20 MG PO TABS: 80.0000 mg | ORAL_TABLET | Freq: Every day | ORAL | Status: DC

## 2015-04-12 MED ORDER — INSULIN GLARGINE 100 UNIT/ML ~~LOC~~ SOLN: 19.0000 [IU] | Freq: Every morning | SUBCUTANEOUS | Status: DC

## 2015-04-12 MED ORDER — WARFARIN SODIUM 5 MG PO TABS
5.0000 mg | ORAL_TABLET | Freq: Once | ORAL | Status: AC
  Administered 2015-04-12: 5 mg via ORAL
  Filled 2015-04-12: qty 1

## 2015-04-12 NOTE — Progress Notes (Signed)
CSW received call from patient's daughter [name]- she and her sister have now agreed to short term SNF. They prefer [facility] if possible as he has been a former resident there.  Fl2 updated and clinicals sent to [facility]. Awaiting stability per MD.  Lupita Leash T. Jaci Lazier, [place] [phone]

## 2015-04-12 NOTE — Progress Notes (Signed)
ANTICOAGULATION CONSULT NOTE - Follow Up Consult  Pharmacy Consult for Coumadin Indication: atrial fibrillation   Patient Measurements: Height:  (177.8 cm) Weight: 264 lb 1.8 oz (119.8 kg) IBW/kg (Calculated) : 73  Vital Signs: Temp: 97.9 F (36.6 C) (09/19 0512) Temp Source: Axillary (09/19 0512) BP: 159/57 mmHg (09/19 0512) Pulse Rate: 65 (09/19 0512)  Labs:  Recent Labs  04/10/15 0420 04/11/15 0444 04/12/15 0414  HGB 10.6* 11.1*  --   HCT 33.3* 34.5*  --   PLT 166 175  --   LABPROT 30.4* 29.1* 25.6*  INR 2.97* 2.80* 2.37*  CREATININE 2.26* 2.37* 2.63*    Estimated Creatinine Clearance: 29.5 mL/min (by C-G formula based on Cr of 2.63).  Assessment: 81yo male with AFib, INR therapeutic/stable on home dose of Coumadin.  Pt is on Amiodarone (new) which may increase INR.  Hg with slight increase and pltc wnl.  No bleeding noted.    INR therapeutic at 2.37. Will likely need a  a couple of times a week alternating with 7.5.   Goal of Therapy:  INR 2-3 Monitor platelets by anticoagulation protocol: Yes   Plan:  Coumadin  x 1 dose Continue daily INR to monitor for potential drug interactions Watch for s/s of bleeding  Sheppard Coil PharmD., BCPS Clinical Pharmacist Pager [phone] 04/12/2015 11:20 AM

## 2015-04-12 NOTE — Progress Notes (Signed)
Inpatient Diabetes Program Recommendations  AACE/ADA: New Consensus Statement on Inpatient Glycemic Control (2015)  Target Ranges:  Prepandial:   less than 140 mg/dL      Peak postprandial:   less than 180 mg/dL (1-2 hours)      Critically ill patients:  140 - 180 mg/dL   Results for Tyler [name], Tyler [name] (MRN [id number]) as of 04/12/2015 09:09  Ref. Range 04/11/2015 05:49 04/11/2015 11:12 04/11/2015 16:17 04/11/2015 21:57 04/12/2015 06:35  Glucose-Capillary Latest Ref Range: 65-99 mg/dL 409 (H) 811 (H) 914 (H) 206 (H) 321 (H)   Review of Glycemic Control  Diabetes history: DM 2 Outpatient Diabetes medications: Lantus 27 units Q AM, 45 units Q PM, Novolog 05-03-11 TID meal coverage Current orders for Inpatient glycemic control: Lantus 15 units Q AM, 45 units Q PM, Novolog Resistant + Novolog 5 units TID meal coverage  Inpatient Diabetes Program Recommendations: Insulin - Basal: Glucose 321 mg/dl this am. If patient is not discharged, please consider increasing basal insulin to 20 units QAM, 50 units QPM.  Thanks,  Christena Deem RN, MSN, [facility] Inpatient Diabetes Coordinator Team Pager [phone] (8a-5p)

## 2015-04-12 NOTE — Progress Notes (Signed)
Family Medicine Teaching Service Daily Progress Note Intern Pager: [phone]  Patient name: Tyler [name] Medical record number: [id number] Date of birth: Feb 03, 1934 Age: 81 y.o. Gender: male  Primary Care Provider: [name], MD Consultants: GI, Cardiology Code Status: FULL  Pt Overview and Major Events to Date:  9/12: Admitted for shortness of breath concerning for CHF exacerbation, Wt 291 9/13: Continued on Lasix drip. Wt 280lbs. Amiodarone started for PAF. 9/14: Continued Lasix drip. Wt 281lbs. 9/15: Continued Lasix drip, 1 dose Metolazone. 9/16: Wt down to 273lbs.  9/18: Wt down to 275 lbs. Switched to PO diuretics. Creatinine creeping up. Constipation continues. Repeat enema  Assessment and Plan: Tyler [name] is a 81 y.o. male presenting with recurrent hypoxemic respiratory failure from acute CHF exacerbation. PMH is significant for HFpEF, HTN, HLD, T2DM, OSA, OHS, SSS and PAF s/p dual chamber pacer 2013.  Acute on Chronic Diastolic HF, HFpEF: Echo on most recent admission with EF 55-60%, G2DD. Wt 291>>275 > 268>>264. Urine output: 800cc over 24 hrs (from 2.15L) with net diuresis -7.1 L since admission. Concern given rising creatinine. He has some degree of cardiorenal syndrome.  - Heart Failure team following, appreciate recommendations: Torsemide  per day starting 9/20 - Daily weights, strict I/O, fluid restriction - Holding beta blocker given low suspicion for ACS and acute CHF exacerbation - Continue atorvastatin 40 mg PO daily - Could use the vest prior to discharge to verify full diuresis.  Stage III CKD: Presumed baseline ~2 Scr, most recently trended downward with diuresis but has been steadily creeping back up. He is diuresing, but concern for cardiorenal syndrome given this trend. Cr 2.37>>2.63 - Continue to monitor  - Milk of Magnesia outpatient prn constipation treatment should be discontinued at discharge due to SCr persistently >2mg /dl.  Acute  Hypoxemic Respiratory Failure: Multifactorial with volume overload and abdominal distention worsening pre-existing asthma, OHS, OSA, and 20 pack/year smoking history with likely undiagnosed COPD. Required O2 2L initially, now on 1L with good saturations. Will continue to follow.  - O2 prn.  - Continue allergy/reactive airway therapy: flonase, loratadine, singulair, formulary LABA/ICS, PRN albuterol - Duonebs q6h PRN for wheezing  - Patient will need CPAP on outpatient basis  Ogilvie's Syndrome: Very chronic problem with neg abd U/S, possible adynamic ileus on KUB. Stool x 1 over 24 hours - BID Miralax - will order another retention enema  - Continue simethicone and PPI - Hold anticholinergics on discharge  Rash: With history of cutaneous hypersensitivity to detergents, suspect gown as cause. Improved somewhat since changing gown. No rash today.  - Continue to follow.   Hypertension: Blood pressure in range. 126-159/36-64 - Continue Norvasc 2.5 mg PO daily  - Hydralazine  IV q2h PRN for SBP > 170. Would avoid this if possible given the possibility of cardiorenal syndrome.   Type II Diabetes Mellitus, Insulin-Dependent: Well, possibly too tightly, controlled - Hb A1c 6.6% on 9/8. Home dose is 27u qAM, 45u qPM + SSI. Recent CBGs climbing significantly with return of appetite. CBG 206-321 over 24 hours. Required 46 units novalog over 24 hours  - AM lantus 15u--> will increase to 25 units AM ; 45u PM  - Continue 5u TID AC + res SSI  Arrhythmias: Sick Sinus, Paroxysmal AFib/Flutter:  - On coumadin per pharmacy, amiodarone 200 mg BID started (9/13)  Chronic Pain: - Continue to hold narcotics as can be contributing to constipation - Discontinued home mobic given CHF - Tylenol PRN pain  Social: Patient is being  treated for cognitive impairment. Mr. Hollars daughter, [name], requests daily updates to which the patient has consented verbally.She had been told that definitive plans  for the day can be relayed to her around 1:45 - 2:00pm daily after rounds and noon conference. - Continue donepezil - needs to go to SNF. Patient agrees.   History of recurrent Klebsiella UTI due to Bladder outlet obstruction from BPH: No evidence of infection at this time with good UOP  - Low threshold for repeat U/A and bladder scan if symptoms recur or urinary retention suspected. Consider further urologic investigation as outpatient.  - Continue myrbetriq, flomax. Hold cardura as redundant therapy. - Hold elavil for concern of worsening retention.  Chronic RLE wound: Followed at wound care clinic as outpatient.  - WOC recommendations appreciated.  FEN/GI: Heart healthy/carb modified diet, saline lock IV Prophylaxis: Therapeutic coumadin  Disposition: Continue diuresis, may be stable for D/C 9/19 when he will be discharged back to [facility] due to daughters' adamant request (SNF is thought to be more ideal dispo).   Subjective:  Patient states he is doing okay today. Abdominal bloating is stable, but states enema helped with discomfort yesterday; states enema released mostly gas with some stool. No chest pain. Notes of SOB that is stable from yesterday.   Objective: Temp:  [97.9 F (36.6 C)-98.4 F (36.9 C)] 97.9 F (36.6 C) (09/19 0512) Pulse Rate:  [65-70] 65 (09/19 0512) Resp:  [16-18] 18 (09/19 0512) BP: (126-159)/(36-64) 159/57 mmHg (09/19 0512) SpO2:  [89 %-98 %] 94 % (09/19 0512) Weight:  [264 lb 1.8 oz (119.8 kg)] 264 lb 1.8 oz (119.8 kg) (09/19 1610) Physical Exam: Gen: Pleasant elderly male CV: RRR, no murmur, no LE edema, no JVD Pulm: Nonlabored, decreased sounds at bases bilaterally, no crackles appreciated today.  Abd: + BS, Nontender but taut, No edema.  Neuro: Oriented, speech normal  Laboratory:  Recent Labs Lab 04/09/15 0426 04/10/15 0420 04/11/15 0444  WBC 7.1 6.1 6.3  HGB 10.5* 10.6* 11.1*  HCT 32.4* 33.3* 34.5*  PLT 162 166 175     Recent Labs Lab 04/05/15 1537  04/10/15 0420 04/11/15 0444 04/12/15 0414  NA 140  < > 132* 138 132*  K 5.2*  < > 4.4 3.9 3.9  CL 107  < > 91* 91* 84*  CO2 27  < > 32 36* 37*  BUN 34*  < > 38* 34* 41*  CREATININE 1.98*  < > 2.26* 2.37* 2.63*  CALCIUM 8.6*  < > 8.6* 9.1 9.0  PROT 7.8  --   --   --   --   BILITOT 0.6  --   --   --   --   ALKPHOS 105  --   --   --   --   ALT 17  --   --   --   --   AST 19  --   --   --   --   GLUCOSE 67  < > 417* 196* 293*  < > = values in this interval not displayed.    Palma Holter, MD 04/12/2015, 6:54 AM PGY-1, [facility] FPTS Intern pager: [phone], text pages welcome

## 2015-04-12 NOTE — Progress Notes (Signed)
Advanced Heart Failure Rounding Note  Primary Physician: Dr. Cyndia Bent Primary Cardiologist: Dr [name] HF: Tyler [name]  Subjective:    Says he feels "somewhat" better than when he came in.  Was given simethicone which seems to have helped his abd discomfort.  Unsure about going back to assisted living facility today.  + 300 mL with cessation of IV diuresis yesterday.  Will hold diuretics today with bump in Cr.  (2.37 -> 2.63) Weight down about 20 lbs overall.   Objective:   Weight Range: 264 lb 1.8 oz (119.8 kg) Body mass index is 37.9 kg/(m^2).   Vital Signs:   Temp:  [97.9 F (36.6 C)-98.4 F (36.9 C)] 97.9 F (36.6 C) (09/19 0512) Pulse Rate:  [65-70] 65 (09/19 0512) Resp:  [16-18] 18 (09/19 0512) BP: (126-159)/(36-64) 159/57 mmHg (09/19 0512) SpO2:  [89 %-98 %] 94 % (09/19 0512) Weight:  [264 lb 1.8 oz (119.8 kg)] 264 lb 1.8 oz (119.8 kg) (09/19 0512) Last BM Date: 04/11/15 (very little per day nurse)  Weight change: Filed Weights   04/10/15 0353 04/11/15 0400 04/12/15 0512  Weight: 275 lb 6.4 oz (124.921 kg) 268 lb 4.8 oz (121.7 kg) 264 lb 1.8 oz (119.8 kg)    Intake/Output:   Intake/Output Summary (Last 24 hours) at 04/12/15 0734 Last data filed at 04/12/15 0645  Gross per 24 hour  Intake   1100 ml  Output    800 ml  Net    300 ml     Physical Exam: General: Obese, Chronically ill appearing. HEENT: normal Neck: supple. Thick, JVP 9-10 cm. Carotids 2+ bilat; no bruits. No lymphadenopathy or thyromegaly. Cor: PMI nondisplaced. Regular rate and rhythm. No M/G/R  Lungs: CTA, slightly diminished bases. Abdomen: Obese, soft, NT, still significantly distended, No HSM. No bruits or masses. Distant bowel sounds. Extremities: no cyanosis, clubbing, rash. 1+ LE edema. Covered wound on R LE. Followed outpatient and WOC inpatient. Neuro: alert & orientedx3, cranial nerves grossly intact. moves all 4 extremities w/o difficulty. Affect flat  Telemetry:   discontinued  Labs: CBC  Recent Labs  04/10/15 0420 04/11/15 0444  WBC 6.1 6.3  HGB 10.6* 11.1*  HCT 33.3* 34.5*  MCV 90.0 90.3  PLT 166 175   Basic Metabolic Panel  Recent Labs  04/11/15 0444 04/12/15 0414  NA 138 132*  K 3.9 3.9  CL 91* 84*  CO2 36* 37*  GLUCOSE 196* 293*  BUN 34* 41*  CALCIUM 9.1 9.0   Liver Function Tests No results for input(s): AST, ALT, ALKPHOS, BILITOT, PROT, ALBUMIN in the last 72 hours. No results for input(s): LIPASE, AMYLASE in the last 72 hours. Cardiac Enzymes No results for input(s): CKTOTAL, CKMB, CKMBINDEX, TROPONINI in the last 72 hours.  BNP: BNP (last 3 results)  Recent Labs  03/15/15 1820 04/05/15 1537 04/11/15 1020  BNP 195.8* 317.1* 53.9    ProBNP (last 3 results) No results for input(s): PROBNP in the last 8760 hours.   D-Dimer No results for input(s): DDIMER in the last 72 hours. Hemoglobin A1C No results for input(s): HGBA1C in the last 72 hours. Fasting Lipid Panel No results for input(s): CHOL, HDL, LDLCALC, TRIG, CHOLHDL, LDLDIRECT in the last 72 hours. Thyroid Function Tests No results for input(s): TSH, T4TOTAL, T3FREE, THYROIDAB in the last 72 hours.  Invalid input(s): FREET3  Imaging/Studies:  No results found.  Latest Echo  Latest Cath   Medications:     Scheduled Medications: . amiodarone  200 mg Oral BID  .  amLODipine  2.5 mg Oral Daily  . atorvastatin  40 mg Oral q1800  . cycloSPORINE  1 drop Both Eyes BID  . donepezil  10 mg Oral QHS  . gabapentin  100 mg Oral TID  . insulin aspart  0-20 Units Subcutaneous TID WC  . insulin aspart  5 Units Subcutaneous TID WC  . insulin glargine  15 Units Subcutaneous q morning - 10a  . insulin glargine  45 Units Subcutaneous QHS  . loratadine  10 mg Oral Daily  . metolazone  5 mg Oral Daily  . mirabegron ER  50 mg Oral Daily  . mometasone-formoterol  2 puff Inhalation BID  . montelukast  10 mg Oral QHS  . mupirocin cream  1 application  Topical Daily  . pantoprazole  40 mg Oral Daily  . polyethylene glycol  17 g Oral BID  . sertraline  25 mg Oral Daily  . simethicone  80 mg Oral QID  . sodium chloride  3 mL Intravenous Q12H  . sodium chloride  3 mL Intravenous Q12H  . tamsulosin  0.4 mg Oral Daily  . torsemide  80 mg Oral Daily  . Warfarin - Pharmacist Dosing Inpatient   Does not apply q1800    Infusions:    PRN Medications: sodium chloride, acetaminophen, albuterol, diphenhydrAMINE-zinc acetate, hydrALAZINE, ipratropium-albuterol, sodium chloride   Assessment/Plan   Tyler [name] is a 81 y.o. male with history of Chronic diastolic CHF, Echo 03/16/15 Echo EF 55-60%, Grade 2 DD, mildly dilated RV, hx of sick sinus syndrome s/p STJ dual chamber PCM 2013, paroxysmal a fib stable on coumadin, OHS/OSA non-compliant with CPAP, HTN, and DM who presented with worsening SOB, orthopnea, and edema.  1. Acute on chronic diastolic HF, EF 55-60%, grade 2 DD, mildly dilated RV. s/p STJ dual chamber PPM 2013 - Volume status much improved and Cr trending up. - Hold torsemide today.  Resume tomorrow at 80 mg daily for chronic dose. - Continue daily weights, strict I/O, and fluid restriction  - With 20 lbs off think this was only contributing factor and #2 has a lot to do with chronic SOB. - Has HF clinic follow up scheduled for 9/27 2. OSA/OHS - Likely major contributors to his symptoms as well as his RV dysfunction.  - Needs to follow up with pulmonary sleep medicine at discharge. 3. Paroxysmal a fib - CHA2DS2VASC score at least 5 (7.2% risk of stroke per year) - On chronic coumadin - hx of cardioversion - NSR currently.  - Interrogation showed 49% afib burden since 11/02/14 - Started on amio 200 mg BID 9/13 4. AKI on CKD Stage III-IV - Will monitor closely with diuresis - Cr trending up 2.13 -> 2.26 -> 2.37 -> 2.63 5. Hyperkalemia - Improved. Continue to monitor.   6. HTN 7. Hx of sick sinus syndrome/symptomatic  bradycardia - s/p STJ dual chamber PPM 2013 as above. 8. DM 9. Ogilvie's syndrome : Chronic - GI following.  Length of Stay: 7  Graciella Freer PA-C 04/12/2015, 7:34 AM  Advanced Heart Failure Team Pager [phone] (M-F; 7a - 4p)  Please contact [facility] for night-coverage after hours (4p -7a ) and weekends on [email or web address]   Patient seen with PA, agree with the above note.    Creatinine up a bit today.  Volume status looks ok at this point, he has diuresed well this admission.  Still with dyspnea. I think that he likely has OHS/OSA which plays a role.  Also has Ogilvie's  syndrome with distended abdomen that likely impairs his respiratory mechanics.  I do not think that further aggressive diuresis will be helpful. - Hold diuretics today, start torsemide 80 mg daily tomorrow.  - Needs pulmonary followup and sleep study for suspected OHS/OSA.   Paroxysmal atrial fibrillation: Can decrease amiodarone to 200 daily when he goes home.  Marca Ancona 04/12/2015 10:10 AM

## 2015-04-12 NOTE — Progress Notes (Signed)
Occupational Therapy Treatment Patient Details Name: Tyler [name] MRN: [id number] DOB: 10-12-1933 Today's Date: 04/12/2015    History of present illness 81 y.o. male admitted with dyspnea and ileus. PMH is significant for CHF, SSS s/p pacemaker, a flutter/fib on warfarin, CKD, HTN, T2DM, HLD, BPH, Asthma, OSA, anemia   OT comments  Pt demonstrates improving strength and endurance.  He was able to stand, in prep for ADLs, with mod A  Follow Up Recommendations  SNF    Equipment Recommendations  None recommended by OT    Recommendations for Other Services      Precautions / Restrictions Precautions Precautions: Fall Precaution Comments: pt with h/o falls and progressive decline in his mobility.        Mobility Bed Mobility Overal bed mobility: Needs Assistance Bed Mobility: Supine to Sit;Sit to Supine     Supine to sit: Min assist Sit to supine: Min assist   General bed mobility comments: assist to lift trunk and to lift LEs onto bed.  Requires increased time   Transfers Overall transfer level: Needs assistance Equipment used: Rolling walker (2 wheeled) Transfers: Sit to/from Stand Sit to Stand: Mod assist         General transfer comment: Pt moved sit to stand x 3 with mod A with bed height elevated     Balance Overall balance assessment: Needs assistance Sitting-balance support: Feet supported Sitting balance-Leahy Scale: Fair     Standing balance support: Bilateral upper extremity supported Standing balance-Leahy Scale: Poor                     ADL Overall ADL's : Needs assistance/impaired                         Toilet Transfer: Moderate assistance;Stand-pivot;BSC;RW           Functional mobility during ADLs: Moderate assistance (sit to stand )        Vision                     Perception     Praxis      Cognition   Behavior During Therapy: Flat affect Overall Cognitive Status: No family/caregiver  present to determine baseline cognitive functioning                       Extremity/Trunk Assessment               Exercises General Exercises - Upper Extremity Shoulder Flexion: Strengthening;Right;Left;10 reps;Seated   Shoulder Instructions       General Comments      Pertinent Vitals/ Pain       Pain Assessment: No/denies pain  Home Living                                          Prior Functioning/Environment              Frequency Min 2X/week     Progress Toward Goals  OT Goals(current goals can now be found in the care plan section)  Progress towards OT goals: Progressing toward goals  ADL Goals Pt Will Perform Upper Body Dressing: with set-up;sitting Pt Will Perform Lower Body Dressing: with mod assist;sit to/from stand;with adaptive equipment;sitting/lateral leans Pt Will Transfer to Toilet: with supervision;stand pivot transfer;bedside commode Pt Will Perform Toileting - Clothing  Manipulation and hygiene: with supervision;sit to/from stand  Plan Discharge plan needs to be updated    Co-evaluation                 End of Session Equipment Utilized During Treatment: Rolling walker;Oxygen   Activity Tolerance Patient tolerated treatment well   Patient Left in bed;with call bell/phone within reach;with bed alarm set   Nurse Communication Mobility status        Time: 1610-9604 OT Time Calculation (min): 21 min  Charges: OT General Charges $OT Visit: 1 Procedure OT Treatments $Therapeutic Activity: 8-22 mins  Conarpe, Wendi M 04/12/2015, 3:49 PM

## 2015-04-12 NOTE — Care Management Important Message (Signed)
Important Message  Patient Details  Name: Tyler [name] MRN: [id number] Date of Birth: 04-07-1934   Medicare Important Message Given:  Yes-third notification given    Orson Aloe 04/12/2015, 9:46 AM

## 2015-04-12 NOTE — Telephone Encounter (Signed)
Attempted to confirm remote transmission with pt. No answer and was unable to leave a message.   

## 2015-04-13 DIAGNOSIS — E1152 Type 2 diabetes mellitus with diabetic peripheral angiopathy with gangrene: Secondary | ICD-10-CM

## 2015-04-13 LAB — BASIC METABOLIC PANEL
ANION GAP: 11 (ref 5–15)
BUN: 50 mg/dL — ABNORMAL HIGH (ref 6–20)
CALCIUM: 8.7 mg/dL — AB (ref 8.9–10.3)
CO2: 36 mmol/L — ABNORMAL HIGH (ref 22–32)
Chloride: 85 mmol/L — ABNORMAL LOW (ref 101–111)
Creatinine, Ser: 2.73 mg/dL — ABNORMAL HIGH (ref 0.61–1.24)
GFR calc Af Amer: 24 mL/min — ABNORMAL LOW (ref >60)
GFR, EST NON AFRICAN AMERICAN: 21 mL/min — AB (ref >60)
GLUCOSE: 318 mg/dL — AB (ref 65–99)
POTASSIUM: 4 mmol/L (ref 3.5–5.1)
SODIUM: 132 mmol/L — AB (ref 135–145)

## 2015-04-13 LAB — GLUCOSE, CAPILLARY
GLUCOSE-CAPILLARY: 268 mg/dL — AB (ref 65–99)
GLUCOSE-CAPILLARY: 249 mg/dL — AB (ref 65–99)
Glucose-Capillary: 327 mg/dL — ABNORMAL HIGH (ref 65–99)
Glucose-Capillary: 183 mg/dL — ABNORMAL HIGH (ref 65–99)

## 2015-04-13 LAB — PROTIME-INR
INR: 2.37 — AB (ref 0.00–1.49)
PROTHROMBIN TIME: 25.6 s — AB (ref 11.6–15.2)

## 2015-04-13 MED ORDER — MILK AND MOLASSES ENEMA
1.0000 | Freq: Once | RECTAL | Status: AC
  Administered 2015-04-13: 250 mL via RECTAL
  Filled 2015-04-13: qty 250

## 2015-04-13 MED ORDER — WARFARIN SODIUM 5 MG PO TABS
5.0000 mg | ORAL_TABLET | Freq: Once | ORAL | Status: AC
  Administered 2015-04-13: 5 mg via ORAL
  Filled 2015-04-13: qty 1

## 2015-04-13 MED ORDER — PANTOPRAZOLE SODIUM 40 MG PO TBEC
40.0000 mg | DELAYED_RELEASE_TABLET | Freq: Two times a day (BID) | ORAL | Status: DC
  Administered 2015-04-13 – 2015-04-21 (×16): 40 mg via ORAL
  Filled 2015-04-13 (×16): qty 1

## 2015-04-13 MED ORDER — TORSEMIDE 20 MG PO TABS: 80.0000 mg | ORAL_TABLET | Freq: Every day | ORAL | Status: DC

## 2015-04-13 NOTE — Progress Notes (Signed)
ANTICOAGULATION CONSULT NOTE - Follow Up Consult  Pharmacy Consult for Coumadin Indication: atrial fibrillation   Patient Measurements: Height:  (177.8 cm) Weight: 260 lb 9.3 oz (118.2 kg) IBW/kg (Calculated) : 73  Vital Signs: Temp: 98.1 F (36.7 C) (09/20 0550) Temp Source: Oral (09/20 0550) BP: 160/46 mmHg (09/20 0550) Pulse Rate: 57 (09/20 0550)  Labs:  Recent Labs  04/11/15 0444 04/12/15 0414 04/13/15 0431  HGB 11.1*  --   --   HCT 34.5*  --   --   PLT 175  --   --   LABPROT 29.1* 25.6* 25.6*  INR 2.80* 2.37* 2.37*  CREATININE 2.37* 2.63* 2.73*    Estimated Creatinine Clearance: 28.3 mL/min (by C-G formula based on Cr of 2.73).  Assessment: 81yo male with AFib, INR therapeutic/stable on home dose of Coumadin.  Pt is on Amiodarone (new) which may increase INR.  Hg with slight increase and pltc wnl.  No bleeding noted.    INR continues to be therapeutic at 2.37. Will likely need a  a couple of times a week alternating with 7.5 at discharge.  Goal of Therapy:  INR 2-3 Monitor platelets by anticoagulation protocol: Yes   Plan:  Repeat Coumadin  x 1 dose Continue daily INR to monitor for potential drug interactions Watch for s/s of bleeding  Sheppard Coil PharmD., BCPS Clinical Pharmacist Pager [phone] 04/13/2015 7:10 AM

## 2015-04-13 NOTE — Progress Notes (Signed)
Advanced Heart Failure Rounding Note  Primary Physician: Dr. Cyndia Bent Primary Cardiologist: Dr [name] HF: [place]  Subjective:    Still sore all over.  Says breathing is "so-so", near his baseline.  Still with some abd discomfort but seems to be improving.  Says he has not been weighed in two days. (bed weights being used).  He is able to stand and has done so for weights this admission.  + 880 mL with holding diuresis yesterday. Weights inaccurate x 2 days (bed weights).  Standing weight today only down 1 lb from Sept 17, last standing weight per pt.  Will hold diuretics again today with bump in Cr.  (2.37 -> 2.63 ->2.73) Weight down about 17 lbs overall. 284 -> 267  Objective:   Weight Range: 260 lb 9.3 oz (118.2 kg) Body mass index is 37.39 kg/(m^2).   Vital Signs:   Temp:  [98.1 F (36.7 C)-99 F (37.2 C)] 98.1 F (36.7 C) (09/20 0550) Pulse Rate:  [57-80] 57 (09/20 0550) Resp:  [18] 18 (09/20 0550) BP: (113-160)/(31-52) 160/46 mmHg (09/20 0550) SpO2:  [93 %-100 %] 98 % (09/20 0550) Weight:  [260 lb 9.3 oz (118.2 kg)] 260 lb 9.3 oz (118.2 kg) (09/20 0550) Last BM Date: 04/12/15  Weight change: Filed Weights   04/11/15 0400 04/12/15 0512 04/13/15 0550  Weight: 268 lb 4.8 oz (121.7 kg) 264 lb 1.8 oz (119.8 kg) 260 lb 9.3 oz (118.2 kg)    Intake/Output:   Intake/Output Summary (Last 24 hours) at 04/13/15 0740 Last data filed at 04/13/15 0640  Gross per 24 hour  Intake   1080 ml  Output    200 ml  Net    880 ml     Physical Exam: General: Obese, Chronically ill appearing. HEENT: normal Neck: supple. Thick, JVP 9-10 cm. Carotids 2+ bilat; no bruits. No lymphadenopathy or thyromegaly noted. Cor: PMI nondisplaced. RRR. No M/G/R  Lungs: Diminished at bases. Abdomen: Obese, soft, NT, ++ distended, No hepatosplenomegaly. No bruits or masses. Distant bowel sounds. Extremities: no cyanosis, clubbing, rash. Trace to 1+ LE edema R>L. Covered wound on R LE. Followed  outpatient and WOC inpatient. Neuro: alert & orientedx3, cranial nerves grossly intact. moves all 4 extremities w/o difficulty. Affect flat  Telemetry:  discontinued  Labs: CBC  Recent Labs  04/11/15 0444  WBC 6.3  HGB 11.1*  HCT 34.5*  MCV 90.3  PLT 175   Basic Metabolic Panel  Recent Labs  04/12/15 0414 04/13/15 0431  NA 132* 132*  K 3.9 4.0  CL 84* 85*  CO2 37* 36*  GLUCOSE 293* 318*  BUN 41* 50*  CALCIUM 9.0 8.7*   Liver Function Tests No results for input(s): AST, ALT, ALKPHOS, BILITOT, PROT, ALBUMIN in the last 72 hours. No results for input(s): LIPASE, AMYLASE in the last 72 hours. Cardiac Enzymes No results for input(s): CKTOTAL, CKMB, CKMBINDEX, TROPONINI in the last 72 hours.  BNP: BNP (last 3 results)  Recent Labs  03/15/15 1820 04/05/15 1537 04/11/15 1020  BNP 195.8* 317.1* 53.9    ProBNP (last 3 results) No results for input(s): PROBNP in the last 8760 hours.   D-Dimer No results for input(s): DDIMER in the last 72 hours. Hemoglobin A1C No results for input(s): HGBA1C in the last 72 hours. Fasting Lipid Panel No results for input(s): CHOL, HDL, LDLCALC, TRIG, CHOLHDL, LDLDIRECT in the last 72 hours. Thyroid Function Tests No results for input(s): TSH, T4TOTAL, T3FREE, THYROIDAB in the last 72 hours.  Invalid input(s):  FREET3  Imaging/Studies:  No results found.  Latest Echo  Latest Cath   Medications:     Scheduled Medications: . amiodarone  200 mg Oral BID  . amLODipine  2.5 mg Oral Daily  . atorvastatin  40 mg Oral q1800  . cycloSPORINE  1 drop Both Eyes BID  . donepezil  10 mg Oral QHS  . gabapentin  100 mg Oral TID  . insulin aspart  0-20 Units Subcutaneous TID WC  . insulin aspart  5 Units Subcutaneous TID WC  . insulin glargine  25 Units Subcutaneous q morning - 10a  . insulin glargine  45 Units Subcutaneous QHS  . loratadine  10 mg Oral Daily  . metolazone  5 mg Oral Daily  . mirabegron ER  50 mg Oral Daily   . mometasone-formoterol  2 puff Inhalation BID  . montelukast  10 mg Oral QHS  . mupirocin cream  1 application Topical Daily  . pantoprazole  40 mg Oral Daily  . polyethylene glycol  17 g Oral BID  . sertraline  25 mg Oral Daily  . simethicone  80 mg Oral QID  . sodium chloride  3 mL Intravenous Q12H  . sodium chloride  3 mL Intravenous Q12H  . tamsulosin  0.4 mg Oral Daily  . [START ON 04/14/2015] torsemide  80 mg Oral Daily  . warfarin  5 mg Oral ONCE-1800  . Warfarin - Pharmacist Dosing Inpatient   Does not apply q1800    Infusions:    PRN Medications: sodium chloride, acetaminophen, albuterol, diphenhydrAMINE-zinc acetate, hydrALAZINE, ipratropium-albuterol, sodium chloride   Assessment/Plan   Stuart L Lorge is a 81 y.o. male with history of Chronic diastolic CHF, Echo 03/16/15 Echo EF 55-60%, Grade 2 DD, mildly dilated RV, hx of sick sinus syndrome s/p STJ dual chamber PCM 2013, paroxysmal a fib stable on coumadin, OHS/OSA non-compliant with CPAP, HTN, and DM who presented with worsening SOB, orthopnea, and edema.  1. Acute on chronic diastolic HF, EF 55-60%, grade 2 DD, mildly dilated RV. s/p STJ dual chamber PPM 2013 - Volume status much improved and Cr trending up. - Hold torsemide again today.  Resume tomorrow at 80 mg daily for chronic dose. - Continue daily weights, strict I/O, and fluid restriction.  - Weights should be standing. Pt is able to stand for short periods of time. - Pt still has some SOB with ~ 20 lbs off. Think this was only one contributing factor - Has HF clinic follow up scheduled for 9/27 2. OSA/OHS - Likely major contributors to his symptoms as well as his RV dysfunction.  - Needs to follow up with pulmonary sleep medicine at discharge. 3. Paroxysmal a fib - CHA2DS2VASC score at least 5 (7.2% risk of stroke per year) - On chronic coumadin - hx of cardioversion - NSR currently.  - Interrogation showed 49% afib burden since 11/02/14 - Started  on amio 200 mg BID 9/13, can decreas to 200 mg daily on discharge. 4. AKI on CKD Stage III-IV - Will monitor closely with diuresis - Cr trending up 2.26 -> 2.37 -> 2.63 -> 2.73. Diuretics held yesterday and today. 5. Hyperkalemia - Improved. Continue to monitor.   6. HTN 7. Hx of sick sinus syndrome/symptomatic bradycardia - s/p STJ dual chamber PPM 2013 as above. 8. DM 9. Ogilvie's syndrome : Chronic - GI following.  Dispo: Per CSW family has agreed to short term SNF. He has been resident at [facility] previously. This is likely a better  option than assisted living.  Length of Stay: 8  Graciella Freer PA-C 04/13/2015, 7:40 AM  Advanced Heart Failure Team Pager [phone] (M-F; 7a - 4p)  Please contact [facility] for night-coverage after hours (4p -7a ) and weekends on [email or web address]   Patient seen with PA, agree with the above note.  Creatinine 2.7.  Will hold diuretics today, start torsemide 80 mg daily tomorrow.  Decrease amiodarone to 200 daily when he goes home.  Will add LFTs to am labs.   Marca Ancona 04/13/2015 4:39 PM

## 2015-04-13 NOTE — Progress Notes (Signed)
Inpatient Diabetes Program Recommendations  AACE/ADA: New Consensus Statement on Inpatient Glycemic Control (2015)  Target Ranges:  Prepandial:   less than 140 mg/dL      Peak postprandial:   less than 180 mg/dL (1-2 hours)      Critically ill patients:  140 - 180 mg/dL   Results for NICKEY, KLOEPFER (MRN [id number]) as of 04/13/2015 14:43  Ref. Range 04/12/2015 06:35 04/12/2015 11:12 04/12/2015 17:07 04/12/2015 21:12  Glucose-Capillary Latest Ref Range: 65-99 mg/dL 119 (H) 147 (H) 829 (H) 205 (H)    Results for JERRIT, HOREN (MRN [id number]) as of 04/13/2015 14:43  Ref. Range 04/13/2015 05:57 04/13/2015 11:32  Glucose-Capillary Latest Ref Range: 65-99 mg/dL 784 (H) 696 (H)    Home DM meds: Lantus 27 units Q AM/ 45 units Q PM        Novolog 05-03-11 TID meal coverage   Current orders for Inpatient glycemic control: Lantus 25 units Q AM/ 45 units Q PM                   Novolog Resistant SSI        Novolog 5 units TID meal coverage    -Note AM dose of Lantus increased to 25 units QAM today.  -Patient still having significantly elevated glucose levels.  -Patient takes fairly large doses of Novolog with meals at home.  -Eating 75-100% of meals.    MD- Please consider increasing Novolog Meal Coverage to 10 units tid with meals   Will follow Ambrose Finland RN, MSN, CDE Diabetes Coordinator Inpatient Glycemic Control Team Team Pager: [phone] (8a-5p)

## 2015-04-13 NOTE — Progress Notes (Signed)
Physical Therapy Treatment Patient Details Name: Tyler [name] MRN: [id number] DOB: 19-May-1934 Today's Date: 04/13/2015    History of Present Illness 81 y.o. male admitted with dyspnea and ileus. PMH is significant for CHF, SSS s/p pacemaker, a flutter/fib on warfarin, CKD, HTN, T2DM, HLD, BPH, Asthma, OSA, anemia    PT Comments    Pt with some increase in function today able to have decreased assist to get OOB and take a few steps with RW away from chair and back today. Pt continues to maintain flexed posture and very forward head throughout as well as reporting right knee feeling as if it will buckle although no buckling noted. Pt with decreased RLE pain today and again educated for HEP with encouragement to increase HEP and mobility throughout the day to maximize function. Will continue to follow.   Follow Up Recommendations  SNF;Supervision for mobility/OOB     Equipment Recommendations       Recommendations for Other Services       Precautions / Restrictions Precautions Precautions: Fall    Mobility  Bed Mobility   Bed Mobility: Supine to Sit     Supine to sit: Min assist     General bed mobility comments: assist to fully lift trunk from surface and achieve midline position sitting EOB, cues for sequence with use of rail   Transfers Overall transfer level: Needs assistance   Transfers: Sit to/from Stand;Stand Pivot Transfers Sit to Stand: Mod assist Stand pivot transfers: +2 physical assistance;Min assist       General transfer comment: pt performed sit to stand x 3 from bed and recliner with cues for sequence, hand placement, and anterior translation to initiate transfers. Pt able to pick up feet and pivot with use of RW from bed to chair today with only min assist  Ambulation/Gait Ambulation/Gait assistance: Min assist;+2 safety/equipment Ambulation Distance (Feet): 2 Feet Assistive device: Rolling walker (2 wheeled) Gait Pattern/deviations:  Shuffle;Trunk flexed;Wide base of support   Gait velocity interpretation: Below normal speed for age/gender General Gait Details: pt with flexed posture with cues for position in RW and erect posture, limited by fatigue able to take 3-4 small steps forward and back to and from chair with use of RW. limited by feeling his right knee would buckle even though blocked and max VC to reach for chair as pt tends to plop   Stairs            Wheelchair Mobility    Modified Rankin (Stroke Patients Only)       Balance Overall balance assessment: Needs assistance   Sitting balance-Leahy Scale: Fair       Standing balance-Leahy Scale: Poor                      Cognition Arousal/Alertness: Awake/alert Behavior During Therapy: Flat affect Overall Cognitive Status: Impaired/Different from baseline Area of Impairment: Problem solving             Problem Solving: Slow processing;Decreased initiation;Requires verbal cues      Exercises General Exercises - Lower Extremity Long Arc Quad: AROM;Both;Seated;15 reps Hip ABduction/ADduction: AROM;Both;Seated;15 reps Hip Flexion/Marching: AROM;Both;Seated;15 reps    General Comments        Pertinent Vitals/Pain Pain Assessment: No/denies pain  90-93% on RA with activity    Home Living                      Prior Function  PT Goals (current goals can now be found in the care plan section) Progress towards PT goals: Progressing toward goals    Frequency       PT Plan Current plan remains appropriate    Co-evaluation             End of Session Equipment Utilized During Treatment: Gait belt Activity Tolerance: Patient tolerated treatment well Patient left: in chair;with call bell/phone within reach;with chair alarm set     Time: 1001-1024 PT Time Calculation (min) (ACUTE ONLY): 23 min  Charges:  $Therapeutic Exercise: 8-22 mins $Therapeutic Activity: 8-22 mins                     G Codes:      Delorse Lek 2015-04-23, 12:35 PM Delaney Meigs, PT [phone]

## 2015-04-13 NOTE — Progress Notes (Signed)
Family Medicine Teaching Service Daily Progress Note Intern Pager: [phone]  Patient name: Tyler [name] Medical record number: [id number] Date of birth: May 27, 1934 Age: 81 y.o. Gender: male  Primary Care Provider: [name], MD Consultants: Cardiology Code Status: FULL  Pt Overview and Major Events to Date:  9/12: Admitted for shortness of breath concerning for CHF exacerbation, Wt 291 9/13: Continued on Lasix drip. Wt 280lbs. Amiodarone started for PAF. 9/14: Continued Lasix drip. Wt 281lbs. 9/15: Continued Lasix drip, 1 dose Metolazone. 9/16: Wt down to 273lbs.  9/18: Wt down to 275 lbs. Switched to PO diuretics. Creatinine creeping up. Constipation continues. Repeat enema 9/20: constipation continues; ordered milk and molases enema   Assessment and Plan: Tyler [name] is a 81 y.o. male presenting with recurrent hypoxemic respiratory failure from acute CHF exacerbation. PMH is significant for HFpEF, HTN, HLD, T2DM, OSA, OHS, SSS and PAF s/p dual chamber pacer 2013.  Acute on Chronic Diastolic HF, HFpEF: Echo on most recent admission with EF 55-60%, G2DD. Wt 291>>275 > 268>>264>>260/267. Urine output: 200 + 9 unmeasured over 24 hrs, with net diuresis -6.25L since admission. Concern given rising creatinine. He has some degree of cardiorenal syndrome.  - Heart Failure team following, appreciate recommendations: holding Torsemide  9/20, resume tomorrow - Daily weights, strict I/O, fluid restriction - Holding beta blocker given low suspicion for ACS and acute CHF exacerbation - Continue atorvastatin 40 mg PO daily - per HF team has clinic follow up 9/27  Stage III CKD: Presumed baseline ~2 Scr, most recently trended downward with diuresis but has been steadily creeping back up. He is diuresing, but concern for cardiorenal syndrome given this trend. Cr 2.37>>2.63>>2.73 - Continue to monitor  - Milk of Magnesia outpatient prn constipation treatment should be  discontinued at discharge due to SCr persistently >2mg /dl.  Acute Hypoxemic Respiratory Failure: Multifactorial with volume overload and abdominal distention worsening pre-existing asthma, OHS, OSA, and 20 pack/year smoking history with likely undiagnosed COPD. Required O2 2L initially, now on 1L with good saturations. Will continue to follow.  - will order O2 pulse ox with ambulation to determine if patient needs home O2 - Continue allergy/reactive airway therapy: flonase, loratadine, singulair, formulary LABA/ICS, PRN albuterol - Duonebs q6h PRN for wheezing  - Patient will need CPAP on outpatient basis  Ogilvie's Syndrome: Very chronic problem with neg abd U/S, possible adynamic ileus on KUB. No stool over 24 hours. Patient did not receive enema as planned yesterday.  - BID Miralax - ordered milk and molases enema today - Continue simethicone and PPI - Hold anticholinergics on discharge  Heart Burn: Chronic issue per patient - currently on Protonix  daily--> will increase to BID - will monitor symptoms   Rash: With history of cutaneous hypersensitivity to detergents, suspect gown as cause. Improved somewhat since changing gown. No rash today. - Continue to follow.   Hypertension: Blood pressure in range 113-160/36-57 - Continue Norvasc 2.5 mg PO daily  - Hydralazine  IV q2h PRN for SBP > 170. Would avoid this if possible given the possibility of cardiorenal syndrome.   Type II Diabetes Mellitus, Insulin-Dependent: Well, possibly too tightly, controlled - Hb A1c 6.6% on 9/8. Home dose is 27u qAM, 45u qPM + SSI. Recent CBGs climbing significantly with return of appetite. CBG 193-327 over 24 hours. Required 46 units novalog over 24 hours  - AM lantus 15u--> increased to 25 units AM (first dose today); 45u PM  - Continue 5u TID AC + res SSI  Arrhythmias: Sick Sinus, Paroxysmal AFib/Flutter:  - On coumadin per pharmacy, amiodarone 200 mg BID started (9/13) - Per HF  recommendations Started on amio 200 mg BID 9/13, can decrease to 200 mg daily on discharge  Chronic Pain: - Continue to hold narcotics as can be contributing to constipation - Discontinued home mobic given CHF - Tylenol PRN pain  Social: Patient is being treated for cognitive impairment. Mr. Deruiter daughter, Tyler [name], requests daily updates to which the patient has consented verbally.She had been told that definitive plans for the day can be relayed to her around 1:45 - 2:00pm daily after rounds and noon conference. - Continue donepezil - needs to go to SNF. Patient agrees.   History of recurrent Klebsiella UTI due to Bladder outlet obstruction from BPH: No evidence of infection at this time with good UOP  - Low threshold for repeat U/A and bladder scan if symptoms recur or urinary retention suspected. Consider further urologic investigation as outpatient.  - Continue myrbetriq, flomax. Hold cardura as redundant therapy. - Hold elavil for concern of worsening retention.  Chronic RLE wound: Followed at wound care clinic as outpatient.  - WOC recommendations appreciated.  FEN/GI: Heart healthy/carb modified diet, saline lock IV Prophylaxis: Therapeutic coumadin  Disposition: Continue diuresis, may be stable for D/C 9/19 when he will be discharged back to [facility] due to daughters' adamant request (SNF is thought to be more ideal dispo).   Subjective:  Patient states he is doing fine today. Notes his heart burn is acting up today; states heart burn is a chronic issue. Denies chest pain or difficulty breathing. States his abdomen is same from yesterday.  Objective: Temp:  [98.1 F (36.7 C)-99 F (37.2 C)] 98.1 F (36.7 C) (09/20 0550) Pulse Rate:  [57-80] 57 (09/20 0550) Resp:  [18] 18 (09/20 0550) BP: (113-160)/(31-52) 160/46 mmHg (09/20 0550) SpO2:  [93 %-100 %] 98 % (09/20 0550) Weight:  [260 lb 9.3 oz (118.2 kg)-267 lb 1.6 oz (121.156 kg)] 267 lb 1.6 oz (121.156 kg)  (09/20 0805) Physical Exam: Gen: Pleasant elderly male CV: RRR, no murmur, no LE edema, no JVD Pulm: Nonlabored, decreased sounds at bases bilaterally, mild crackles noted at bases bilaterally Abd: + BS, Nontender but taut,  Extremities: RLE bandaged (chronic wound): clean and dry. No LE edema Neuro: Oriented, speech normal  Laboratory:  Recent Labs Lab 04/09/15 0426 04/10/15 0420 04/11/15 0444  WBC 7.1 6.1 6.3  HGB 10.5* 10.6* 11.1*  HCT 32.4* 33.3* 34.5*  PLT 162 166 175    Recent Labs Lab 04/11/15 0444 04/12/15 0414 04/13/15 0431  NA 138 132* 132*  K 3.9 3.9 4.0  CL 91* 84* 85*  CO2 36* 37* 36*  BUN 34* 41* 50*  CREATININE 2.37* 2.63* 2.73*  CALCIUM 9.1 9.0 8.7*  GLUCOSE 196* 293* 318*    Palma Holter, MD 04/13/2015, 8:14 AM PGY-1, [facility] Family Medicine FPTS Intern pager: [phone], text pages welcome

## 2015-04-14 ENCOUNTER — Encounter: Payer: Self-pay | Admitting: Cardiology

## 2015-04-14 ENCOUNTER — Inpatient Hospital Stay (HOSPITAL_COMMUNITY): Payer: Medicare Other

## 2015-04-14 ENCOUNTER — Encounter (HOSPITAL_COMMUNITY)
Admission: EM | Disposition: A | Discharge status: Skilled Nursing Facility | Payer: Self-pay | Source: Home / Self Care | Attending: Family Medicine

## 2015-04-14 ENCOUNTER — Encounter (HOSPITAL_COMMUNITY): Payer: Self-pay

## 2015-04-14 DIAGNOSIS — K5900 Constipation, unspecified: Secondary | ICD-10-CM

## 2015-04-14 DIAGNOSIS — R140 Abdominal distension (gaseous): Secondary | ICD-10-CM | POA: Insufficient documentation

## 2015-04-14 HISTORY — PX: FLEXIBLE SIGMOIDOSCOPY: SHX5431

## 2015-04-14 LAB — PROTIME-INR
INR: 2.35 — AB (ref 0.00–1.49)
PROTHROMBIN TIME: 25.5 s — AB (ref 11.6–15.2)

## 2015-04-14 LAB — COMPREHENSIVE METABOLIC PANEL
ALBUMIN: 3 g/dL — AB (ref 3.5–5.0)
ALT: 84 U/L — ABNORMAL HIGH (ref 17–63)
ANION GAP: 11 (ref 5–15)
AST: 85 U/L — AB (ref 15–41)
Alkaline Phosphatase: 103 U/L (ref 38–126)
BILIRUBIN TOTAL: 0.7 mg/dL (ref 0.3–1.2)
BUN: 52 mg/dL — AB (ref 6–20)
CHLORIDE: 87 mmol/L — AB (ref 101–111)
CO2: 35 mmol/L — ABNORMAL HIGH (ref 22–32)
Calcium: 8.6 mg/dL — ABNORMAL LOW (ref 8.9–10.3)
Creatinine, Ser: 2.88 mg/dL — ABNORMAL HIGH (ref 0.61–1.24)
GFR calc Af Amer: 22 mL/min — ABNORMAL LOW (ref >60)
GFR calc non Af Amer: 19 mL/min — ABNORMAL LOW (ref >60)
GLUCOSE: 262 mg/dL — AB (ref 65–99)
POTASSIUM: 3.7 mmol/L (ref 3.5–5.1)
Sodium: 133 mmol/L — ABNORMAL LOW (ref 135–145)
TOTAL PROTEIN: 7 g/dL (ref 6.5–8.1)

## 2015-04-14 LAB — GLUCOSE, CAPILLARY
GLUCOSE-CAPILLARY: 278 mg/dL — AB (ref 65–99)
GLUCOSE-CAPILLARY: 316 mg/dL — AB (ref 65–99)
Glucose-Capillary: 257 mg/dL — ABNORMAL HIGH (ref 65–99)
Glucose-Capillary: 146 mg/dL — ABNORMAL HIGH (ref 65–99)

## 2015-04-14 SURGERY — SIGMOIDOSCOPY, FLEXIBLE

## 2015-04-14 SURGERY — SIGMOIDOSCOPY, FLEXIBLE
Anesthesia: Moderate Sedation

## 2015-04-14 MED ORDER — SODIUM CHLORIDE 0.9 % IV SOLN
INTRAVENOUS | Status: DC
  Administered 2015-04-14: via INTRAVENOUS

## 2015-04-14 MED ORDER — POLYETHYLENE GLYCOL 3350 17 G PO PACK
17.0000 g | PACK | Freq: Three times a day (TID) | ORAL | Status: DC
  Administered 2015-04-14 – 2015-04-21 (×20): 17 g via ORAL
  Filled 2015-04-14 (×19): qty 1

## 2015-04-14 MED ORDER — TORSEMIDE 20 MG PO TABS
80.0000 mg | ORAL_TABLET | Freq: Every day | ORAL | Status: DC
  Administered 2015-04-14: 80 mg via ORAL
  Filled 2015-04-14: qty 4

## 2015-04-14 MED ORDER — FENTANYL CITRATE (PF) 100 MCG/2ML IJ SOLN
INTRAMUSCULAR | Status: AC
Start: 2015-04-14 — End: 2015-04-14
  Filled 2015-04-14: qty 2

## 2015-04-14 MED ORDER — INSULIN ASPART 100 UNIT/ML ~~LOC~~ SOLN: 5.0000 [IU] | Freq: Three times a day (TID) | SUBCUTANEOUS | Status: DC

## 2015-04-14 MED ORDER — INSULIN ASPART 100 UNIT/ML ~~LOC~~ SOLN
10.0000 [IU] | Freq: Three times a day (TID) | SUBCUTANEOUS | Status: DC
  Administered 2015-04-14 – 2015-04-21 (×16): 10 [IU] via SUBCUTANEOUS

## 2015-04-14 MED ORDER — MIDAZOLAM HCL 5 MG/ML IJ SOLN
INTRAMUSCULAR | Status: AC
  Filled 2015-04-14: qty 1

## 2015-04-14 MED ORDER — WARFARIN SODIUM 5 MG PO TABS
5.0000 mg | ORAL_TABLET | Freq: Once | ORAL | Status: AC
  Administered 2015-04-14: 5 mg via ORAL
  Filled 2015-04-14: qty 1

## 2015-04-14 MED ORDER — SENNA 8.6 MG PO TABS
1.0000 | ORAL_TABLET | Freq: Two times a day (BID) | ORAL | Status: DC
  Administered 2015-04-14 – 2015-04-19 (×12): 8.6 mg via ORAL
  Filled 2015-04-14 (×12): qty 1

## 2015-04-14 NOTE — Progress Notes (Signed)
Family Medicine Teaching Service Daily Progress Note Intern Pager: [phone]  Patient name: Tyler [name] Medical record number: [id number] Date of birth: [birth year] Age: 81 y.o. Gender: male  Primary Care Tyler [name]: Tyler Inch, MD Consultants: Cardiology Code Status: FULL  Pt Overview and Major Events to Date:  9/12: Admitted for shortness of breath concerning for CHF exacerbation, Wt 291 9/13: Continued on Lasix drip. Wt 280lbs. Amiodarone started for PAF. 9/14: Continued Lasix drip. Wt 281lbs. 9/15: Continued Lasix drip, 1 dose Metolazone. 9/16: Wt down to 273lbs.  9/18: Wt down to 275 lbs. Switched to PO diuretics. Creatinine creeping up. Constipation continues. Repeat enema 9/20: constipation continues; ordered milk and molases enema   Assessment and Plan: Tyler [name] is a 81 y.o. male presenting with recurrent hypoxemic respiratory failure from acute CHF exacerbation. PMH is significant for HFpEF, HTN, HLD, T2DM, OSA, OHS, SSS and PAF s/p dual chamber pacer 2013.  Acute on Chronic Diastolic HF, HFpEF: Echo on most recent admission with EF 55-60%, G2DD. Wt 291>>275 > 268>>264>>>>267>>266. Urine output: decreased over 24 hours with only x1 unmeasured void, with net diuresis -5.26L since admission. Concern given rising creatinine, he has some degree of cardiorenal syndrome.  - Heart Failure team following, appreciate recommendations: holding Torsemide  9/20, resume 9/21 - Daily weights, strict I/O, fluid restriction - Holding beta blocker given low suspicion for ACS and acute CHF exacerbation - Continue atorvastatin 40 mg PO daily - per HF team has clinic follow up 9/27  Stage III CKD: Presumed baseline ~2 Scr, most recently trended downward with diuresis but has been steadily creeping back up. He is diuresing, but concern for cardiorenal syndrome given this trend. Cr 2.37>>2.63>>2.73>>2.88 - Continue to monitor  - Milk of Magnesia outpatient prn constipation  treatment should be discontinued at discharge due to SCr persistently >2mg /dl.  Acute Hypoxemic Respiratory Failure: Multifactorial with volume overload and abdominal distention worsening pre-existing asthma, OHS, OSA, and 20 pack/year smoking history with likely undiagnosed COPD. Required O2 2L initially, now on RA with good saturations. Will continue to follow.  - ordered O2 pulse ox with ambulation to determine if patient needs home O2 - Continue allergy/reactive airway therapy: flonase, loratadine, singulair, formulary LABA/ICS, PRN albuterol - Duonebs q6h PRN for wheezing  - Patient will need CPAP on outpatient basis  Ogilvie's Syndrome: Very chronic problem with neg abd U/S, possible adynamic ileus on KUB. No stool over 24 hours. Patient did not receive enema as planned yesterday. Enema 9/20 with no stool output but release of gas. - BID Miralax - consider adding Sennakot - Continue simethicone and PPI - Hold anticholinergics on discharge  Heart Burn: Chronic issue per patient - Protonix  BID - will monitor symptoms   Rash: With history of cutaneous hypersensitivity to detergents, suspect gown as cause. Improved somewhat since changing gown. No rash today. - Continue to follow.   Hypertension: Blood pressure in range 122-162/36-56 - Continue Norvasc 2.5 mg PO daily  - Hydralazine  IV q2h PRN for SBP > 170. Would avoid this if possible given the possibility of cardiorenal syndrome.   Type II Diabetes Mellitus, Insulin-Dependent: Well, possibly too tightly, controlled - Hb A1c 6.6% on 9/8. Home dose is 27u qAM, 45u qPM + SSI. Recent CBGs climbing significantly with return of appetite. CBG 183-278 over 24 hours. Required 56 units novalog over 24 hours  - AM lantus 25u AM; 45u PM  - Novolog mealtime coverage increased from 5 units to 10 units today  - continue  resistant SSI  Arrhythmias: Sick Sinus, Paroxysmal AFib/Flutter:  - On coumadin per pharmacy, amiodarone 200  mg BID started (9/13) - Per HF recommendations Started on amio 200 mg BID 9/13, can decrease to 200 mg daily on discharge  Chronic Pain: - Continue to hold narcotics as can be contributing to constipation - Discontinued home mobic given CHF - Tylenol PRN pain  Social: Patient is being treated for cognitive impairment. Tyler [name] daughter, Tyler [name], requests daily updates to which the patient has consented verbally.She had been told that definitive plans for the day can be relayed to her around 1:45 - 2:00pm daily after rounds and noon conference. - Continue donepezil - Family has chosen a bed at [facility] for short term SNF   History of recurrent Klebsiella UTI due to Bladder outlet obstruction from BPH: No evidence of infection at this time with good UOP  - Low threshold for repeat U/A and bladder scan if symptoms recur or urinary retention suspected. Consider further urologic investigation as outpatient.  - Continue myrbetriq, flomax. Hold cardura as redundant therapy. - Hold elavil for concern of worsening retention.  Chronic RLE wound: Followed at wound care clinic as outpatient.  - WOC recommendations appreciated.  FEN/GI: Heart healthy/carb modified diet, saline lock IV Prophylaxis: Therapeutic coumadin  Disposition: Continue diuresis, may be stable for D/C 9/19 when he will be discharged back to [facility] due to daughters' adamant request (SNF is thought to be more ideal dispo).   Subjective:  Patient states he is doing fine today. States shortness of breath is a little improved; when asked he does admit to intermittent wheezing. Patient has been on RA with saturations in 93-98%. Abdominal distention is stable this morning. States his abdomen has always been distended but he usually has regular bowel movements at home with Miralax.   Objective: Temp:  [97.3 F (36.3 C)-98.7 F (37.1 C)] 98 F (36.7 C) (09/21 0527) Pulse Rate:  [59-60] 59 (09/21  0527) Resp:  [18-20] 20 (09/21 0527) BP: (122-162)/(36-56) 122/48 mmHg (09/21 0527) SpO2:  [93 %-98 %] 95 % (09/21 0527) Weight:  [266 lb (120.657 kg)-267 lb 1.6 oz (121.156 kg)] 266 lb (120.657 kg) (09/21 0527) Physical Exam: Gen: Pleasant elderly male CV: RRR, no murmur, no LE edema, no JVD Pulm: Nonlabored, decreased sounds at bases bilaterally, crackles noted at bases bilaterally Abd: + BS, Nontender but taut,  Extremities: RLE bandaged (chronic wound): clean and dry. No LE edema Neuro: Oriented, speech normal  Laboratory:  Recent Labs Lab 04/09/15 0426 04/10/15 0420 04/11/15 0444  WBC 7.1 6.1 6.3  HGB 10.5* 10.6* 11.1*  HCT 32.4* 33.3* 34.5*  PLT 162 166 175    Recent Labs Lab 04/12/15 0414 04/13/15 0431 04/14/15 0300  NA 132* 132* 133*  K 3.9 4.0 3.7  CL 84* 85* 87*  CO2 37* 36* 35*  BUN 41* 50* 52*  CREATININE 2.63* 2.73* 2.88*  CALCIUM 9.0 8.7* 8.6*  PROT  --   --  7.0  BILITOT  --   --  0.7  ALKPHOS  --   --  103  ALT  --   --  84*  AST  --   --  85*  GLUCOSE 293* 318* 262*    Palma Holter, MD 04/14/2015, 7:13 AM PGY-1, [facility] Family Medicine FPTS Intern pager: [phone], text pages welcome

## 2015-04-14 NOTE — Progress Notes (Signed)
Physical Therapy Treatment Patient Details Name: JAVAUGHN OPDAHL MRN: [id number] DOB: January 09, 1934 Today's Date: 04/14/2015    History of Present Illness 81 y.o. male admitted with dyspnea and ileus. PMH is significant for CHF, SSS s/p pacemaker, a flutter/fib on warfarin, CKD, HTN, T2DM, HLD, BPH, Asthma, OSA, anemia    PT Comments    Has made a noticeable jump in function with gait and general mobility.  Still quick to fatigue and needs significant assist..  Needs more rehab before going back to his ALF.  Follow Up Recommendations  SNF;Supervision for mobility/OOB     Equipment Recommendations  None recommended by PT    Recommendations for Other Services       Precautions / Restrictions Precautions Precautions: Fall Restrictions Weight Bearing Restrictions: No    Mobility  Bed Mobility               General bed mobility comments: up in the chair  Transfers Overall transfer level: Needs assistance   Transfers: Sit to/from Stand Sit to Stand: Mod assist;Min assist         General transfer comment: sit to stand x4, assisted to come forward; cues for hand placement  Ambulation/Gait Ambulation/Gait assistance: Min assist;+2 safety/equipment Ambulation Distance (Feet): 3 Feet (forward and back, 12 x2 with RW and rest b/w) Assistive device: Rolling walker (2 wheeled) Gait Pattern/deviations: Step-through pattern;Decreased step length - right;Decreased step length - left;Decreased stance time - right;Trunk flexed     General Gait Details: noticeably effortful gait with flexed posture and quickness to fatigue.   Stairs            Wheelchair Mobility    Modified Rankin (Stroke Patients Only)       Balance Overall balance assessment: Needs assistance Sitting-balance support: No upper extremity supported Sitting balance-Leahy Scale: Fair     Standing balance support: Bilateral upper extremity supported Standing balance-Leahy Scale: Poor                       Cognition Arousal/Alertness: Awake/alert Behavior During Therapy: Flat affect Overall Cognitive Status: Impaired/Different from baseline                      Exercises General Exercises - Lower Extremity Hip Flexion/Marching: AROM;Strengthening;Right;10 reps;Seated (working on knee control)    General Comments        Pertinent Vitals/Pain Pain Assessment: Faces Faces Pain Scale: Hurts little more Pain Location: vague reports, stomach, chest Pain Descriptors / Indicators: Tightness Pain Intervention(s): Monitored during session    Home Living                      Prior Function            PT Goals (current goals can now be found in the care plan section) Acute Rehab PT Goals Patient Stated Goal: to go back to ALF PT Goal Formulation: With patient/family Time For Goal Achievement: 04/20/15 Potential to Achieve Goals: Good Progress towards PT goals: Progressing toward goals    Frequency  Min 3X/week    PT Plan Current plan remains appropriate    Co-evaluation             End of Session   Activity Tolerance: Patient tolerated treatment well Patient left: in chair;with call bell/phone within reach;with chair alarm set     Time: 6295-2841 PT Time Calculation (min) (ACUTE ONLY): 23 min  Charges:  $Gait Training: 8-22 mins $Therapeutic Activity:  8-22 mins                    G Codes:      Mottinger, Eliseo Gum 04/14/2015, 2:52 PM 04/14/2015  Emlyn Bing, PT [phone]  (pager)

## 2015-04-14 NOTE — Op Note (Signed)
[facility] [address] [place], [address]   FLEXIBLE SIGMOIDOSCOPY PROCEDURE REPORT  PATIENT: Arsenio, Schnorr  MR#: #[id number] BIRTHDATE: 01/13/1934 , 81  yrs. old GENDER: male ENDOSCOPIST: Beverley Fiedler, MD REFERRED BY: [facility] PROCEDURE DATE:  04/14/2015 PROCEDURE:   Sigmoidoscopy, diagnostic  and with decompression ASA CLASS:   Class III INDICATIONS:colonic ileus, chronic constipation, worsening abd distention clinically and by abd xray. MEDICATIONS: None  DESCRIPTION OF PROCEDURE:   After the risks benefits and alternatives of the procedure were thoroughly explained, informed consent was obtained.  Digital exam revealed no masses. The Pentax adult colonoscope was introduced through the anus  and advanced to the sigmoid colon , The exam was  limited by no prep. The quality of the prep none given   . Estimated blood loss is zero unless otherwise noted in this procedure report. The instrument was then slowly withdrawn as the mucosa was fully examined.       COLON FINDINGS: Severely dilated rectum and sigmoid colon.  Liquid and semi-solid stool in the left colon.  No evidence of colitis. No evidence of volvulus.  No masses or tumors seen.  Copious irrigation and lavage with minimal CO2 insufflation.  Irrigation of 2L of water and lavage of >1L if liquid stool.  The examined colon was decompressed as much as possible and the scope was withdrawn. Retroflexion was not performed today.       The scope was then withdrawn from the patient and the procedure terminated.  COMPLICATIONS: There were no immediate complications.  ENDOSCOPIC IMPRESSION: Colonic ileus, severely dilated sigmoid with no current evidence of mucosal injury.  Irrigation, lavage and decompression of the left colon  RECOMMENDATIONS: 1.  Place rectal tube 2.  Frequent turning while in bed (lie on right side, then left side and prone if possible) 3.  Repeat xray  tomorrow morning. 4.  Maximize ambulation when possible.  Maximize bowel regimen  eSigned:  Beverley Fiedler, MD 04/14/2015 6:40 PM   CC: the patient

## 2015-04-14 NOTE — Progress Notes (Signed)
CSW spoke to [facility] Medicine Physician in attempt to determine if patient is medically stable for d/c today.  She is currently unsure and awaiting clearance from Heart Failure Team.  SNF bed is available at [facility]. Daughter [name] updated on possible d/c today.  She will await call if d/c is confirmed.  DC packet initiated and will be completed once d/c is confirmed.  Lorri Frederick. Jaci Lazier, [place] [phone]

## 2015-04-14 NOTE — Progress Notes (Signed)
[facility] Progress Note  Subjective:  Tyler [name] is a morbidly obese 81 y.o. male. Hx Chronic diastolic dysfunction, PAF, s/p DCCV, chronic Coumadin, hx sick sinus syndrome, s/p pacemaker 2013, hypertension, IDDM, OSA/OHS (noncompliant w/ CPAP), globus sensation with negative work up, dysphagia, constipation, gastroparesis confirmed on GES in 2006.  Globus sensation with extensive unrevealing workup 2012.  Pt has chronic abdominal distention/ileus/Ogilvie's syndrome dating to at least 2005. Has had admissions related to ileus in past.  Admitted September 12 with recurrent acute flare CHF patient was noted to have persistently distended abdomen, but has been like this for many months. He was evaluated by GI on September 14 at which time his abdomen was more tense. He reported that normally he has a bowel movement daily when he is taking his once daily Mira lax. His Mira lax had been held since admission and then was restarted it every other day dosing.KUB 9/13 "air-filled loops of small and large bowel..... Cannot exclude mild, adynamic ileus"  Meds use this admission included 3 times a day IV erythromycin. Initially he was being treated with Reglan 10 mg 3 times daily and then the dose was disc decrease to 5 mg at bedtime and then it was discontinued.Per GI consult note of 04/07/15-- His daughter says that when he used to live in [place] several years ago, that his GI doctor had resorted to milk of molasses enemas which worked very well for the patient. She also says that his Reglan dose was minimized because he was developing tardive dyskinesia symptoms. So far, As an outpatient, the smaller dose at at bedtime does not seem to be causing problems for him. Patient is wheelchair bound now for the last 6-8 months. Prior to that he was ambulating in a wheelchair. During this hospitalization he has been getting out of bed and into the lounge chair at bedside for several  hours per day Patient currently on Mira lax 17 g twice a day and Senokot. Nurse reports that he had a large bowel movement last week after a smog enema. Yesterday he was given a milk and molasses enema and had a small bowel movement. Patient states he feels more distended and has some abdominal discomfort. He has mild nausea today. But he has not vomited. Objective:  Vital signs in last 24 hours: Temp:  [97.3 F (36.3 C)-98 F (36.7 C)] 98 F (36.7 C) (09/21 1245) Pulse Rate:  [59-64] 60 (09/21 1245) Resp:  [18-20] 20 (09/21 1245) BP: (122-162)/(36-58) 161/54 mmHg (09/21 1245) SpO2:  [92 %-98 %] 96 % (09/21 1245) Weight:  [266 lb (120.657 kg)] 266 lb (120.657 kg) (09/21 0527) Last BM Date: 04/13/15 General:   Alert, obese, chronically ill-appearing Heart:  Regular rate and rhythm; no murmurs Pulm; diminished breath sounds bilaterally Abdomen:  Distended, tense, nontender. Bowel signs are tympanitic/tinkling. Extremities:  1+ lower extremity edema Neurologic: Alert and  oriented x4;  grossly normal neurologically. Psych:  Alert and cooperative. Normal mood and affect.  Intake/Output from previous day: 09/20 0701 - 09/21 0700 In: 960 [P.O.:960] Out: -  Intake/Output this shift: Total I/O In: 423 [P.O.:420; I.V.:3] Out: -   BMET  Recent Labs  04/12/15 0414 04/13/15 0431 04/14/15 0300  NA 132* 132* 133*  K 3.9 4.0 3.7  CL 84* 85* 87*  CO2 37* 36* 35*  GLUCOSE 293* 318* 262*  BUN 41* 50* 52*  CREATININE 2.63* 2.73* 2.88*  CALCIUM 9.0 8.7* 8.6*   LFT  Recent Labs  04/14/15 0300  PROT 7.0  ALBUMIN 3.0*  AST 85*  ALT 84*  ALKPHOS 103  BILITOT 0.7   PT/INR  Recent Labs  04/13/15 0431 04/14/15 0300  LABPROT 25.6* 25.5*  INR 2.37* 2.35*   ENDOSCOPIC STUDIES: 05/2012 contrast enema of colon for diffusely distended abdommen: No evidence of colonic obstruction, focal mucosal lesion or volvulus. The colon is diffusely dilated and elongated.  04/2011 EGD for  dysphagia and negative esophagram. Dr Christella Hartigan Moderate gastritis. Path: chronic inactive gastritis, negative for: H pylori, dysplasia, metaplasia  04/2011 Modified Barium swallow: functional swallow. Trace penetration of thins, cleared well with subsequent swallows.  02/2011 Esophagram was normal.   2008 Esophagram with small HH, reflux, mild tertiary contractions, tablet passed.   2006 and 2009 gastric emptying study: 2006 with significant emptying delay. 2009 with normal emptying.   07/2003 Colonoscopy at [facility] for constipation. 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 Mira lax as been reinstituted twice a day the patient still is having trouble moving his bowels and passing gas. Currently he is experiencing some nausea with no vomiting as well as increased abdominal pain from yesterday. Will repeat KUB. If with significant colonic distention may benefit from rectal tube. If he begins to vomit, he will need an NG tube. Otherwise would continue Mira lax twice daily. Glycerin suppository daily. Patient should lie on her left side for 2 hours, then be changed her right side for 2 hours, etc. when in bed. Encourage patient to be out of bed as much as possible and ambulate with assistance as much as possible. Will follow.     LOS: 9 days   Hvozdovic, Tollie Pizza PA-C 04/14/2015, Pager [phone]

## 2015-04-14 NOTE — Progress Notes (Signed)
CSW spoke with patient's daughters [name] and [name] yesterday to provide CSW update.  Family has chosen a bed at [facility] for short term SNF. Awaiting stability per MD and will assist with d/c at that time.  Patient is agreeable to SNF d/c plan as well.  Lorri Frederick. Jaci Lazier, [place] [phone]

## 2015-04-14 NOTE — Progress Notes (Signed)
Inpatient Diabetes Program Recommendations  AACE/ADA: New Consensus Statement on Inpatient Glycemic Control (2015)  Target Ranges:  Prepandial:   less than 140 mg/dL      Peak postprandial:   less than 180 mg/dL (1-2 hours)      Critically ill patients:  140 - 180 mg/dL   Results for Tyler [name], Tyler [name] (MRN [id number]) as of 04/14/2015 13:34  Ref. Range 04/13/2015 11:32 04/13/2015 17:10 04/13/2015 21:28 04/14/2015 06:41 04/14/2015 11:10  Glucose-Capillary Latest Ref Range: 65-99 mg/dL 132 (H) 440 (H) 102 (H) 278 (H) 257 (H)   Review of Glycemic Control  Diabetes history: DM 2 Outpatient Diabetes medications: Lantus 27 units QAM, Lantus 45 units QPM, Novolog 05-03-11 Meal coverage Current orders for Inpatient glycemic control: Lantus 25 units QAM, Lantus 45 units QPM, Novolog Resistant TID, Novolog 10 units TID meal coverage  Inpatient Diabetes Program Recommendations:  Insulin - Basal: Glucose still in 200's on home regimen. Please consider increasing Bedtime Lantus to 50 units QHS.  Thanks,  Christena Deem RN, MSN, [facility] Inpatient Diabetes Coordinator Team Pager [phone] (8a-5p)

## 2015-04-14 NOTE — Progress Notes (Signed)
CSW (Clinical Child psychotherapist) received call from pt daughter. She does not want pt to dc to [facility]. CSW provided her with alternative bed offers and did ask for decision to be made as soon as possible. Pt daughter is aware that there is potential for dc today or tomorrow.  Poonum Ambelal, LCSW [phone]

## 2015-04-14 NOTE — Progress Notes (Signed)
ANTICOAGULATION CONSULT NOTE - Follow Up Consult  Pharmacy Consult for Coumadin Indication: atrial fibrillation   Patient Measurements: Height:  (177.8 cm) Weight: 266 lb (120.657 kg) IBW/kg (Calculated) : 73  Vital Signs: Temp: 98 F (36.7 C) (09/21 1245) Temp Source: Oral (09/21 1245) BP: 161/54 mmHg (09/21 1245) Pulse Rate: 60 (09/21 1245)  Labs:  Recent Labs  04/12/15 0414 04/13/15 0431 04/14/15 0300  LABPROT 25.6* 25.6* 25.5*  INR 2.37* 2.37* 2.35*  CREATININE 2.63* 2.73* 2.88*    Estimated Creatinine Clearance: 27.1 mL/min (by C-G formula based on Cr of 2.88).  Assessment: 81yom continues on coumadin for afib. INR continues to be therapeutic at 2.35. He is also on amiodarone  po bid which is new for him but have yet to see a significant drug interaction. He will likely need a  a couple of times a week alternating with 7.5 at discharge (prior home dose 7.5mg  daily).  Goal of Therapy:  INR 2-3 Monitor platelets by anticoagulation protocol: Yes   Plan:  Repeat Coumadin  x 1 Daily INR  Louie Casa, PharmD, BCPS  04/14/2015 2:54 PM

## 2015-04-14 NOTE — Progress Notes (Signed)
Advanced Heart Failure Rounding Note  Primary Physician: Dr. Cyndia Bent Primary Cardiologist: Dr [name] HF: [place]  Subjective:    Breathing is "fair" today. Abdomen still primary complaint, would like more stool softener.  Having some incontinence.  + 960 mL recorded.  I/O off with incontinence.   Cr continues to trend up despite holding of diuretics.  (2.37 -> 2.63 ->2.73 ->2.88) Weight down about 18 lbs overall from admission. 284 -> 266  Objective:   Weight Range: 266 lb (120.657 kg) Body mass index is 38.17 kg/(m^2).   Vital Signs:   Temp:  [97.3 F (36.3 C)-98.7 F (37.1 C)] 98 F (36.7 C) (09/21 0527) Pulse Rate:  [59-60] 59 (09/21 0527) Resp:  [18-20] 20 (09/21 0527) BP: (122-162)/(36-56) 122/48 mmHg (09/21 0527) SpO2:  [93 %-98 %] 95 % (09/21 0733) Weight:  [266 lb (120.657 kg)] 266 lb (120.657 kg) (09/21 0527) Last BM Date: 04/13/15  Weight change: Filed Weights   04/13/15 0550 04/13/15 0805 04/14/15 0527  Weight: 260 lb 9.3 oz (118.2 kg) 267 lb 1.6 oz (121.156 kg) 266 lb (120.657 kg)    Intake/Output:   Intake/Output Summary (Last 24 hours) at 04/14/15 0915 Last data filed at 04/13/15 2148  Gross per 24 hour  Intake    720 ml  Output      0 ml  Net    720 ml     Physical Exam: General: Obese, Chronically ill appearing. HEENT: normal Neck: supple. Thick, JVP 9-10 cm. Carotids 2+ bilat; no bruits. No lymphadenopathy or thyromegaly. Cor: PMI nondisplaced. Regular rate and rhythm. No M/G/R  Lungs: Diminished at bases. Abdomen: Obese, soft, NT, Chronically distended, No hepatosplenomegaly. No bruits or masses.  + bowel sounds, though distant. Extremities: no cyanosis, clubbing, rash. Trace to 1+ LE edema R>L. Covered wound on R LE. Followed outpatient and [facility] inpatient. Neuro: alert & orientedx3, cranial nerves grossly intact. moves all 4 extremities w/o difficulty. Affect flat  Telemetry:  discontinued  Labs: CBC No results for input(s): WBC,  NEUTROABS, HGB, HCT, MCV, PLT in the last 72 hours. Basic Metabolic Panel  Recent Labs  04/13/15 0431 04/14/15 0300  NA 132* 133*  K 4.0 3.7  CL 85* 87*  CO2 36* 35*  GLUCOSE 318* 262*  BUN 50* 52*  CALCIUM 8.7* 8.6*   Liver Function Tests  Recent Labs  04/14/15 0300  AST 85*  ALT 84*  ALKPHOS 103  BILITOT 0.7  PROT 7.0  ALBUMIN 3.0*   No results for input(s): LIPASE, AMYLASE in the last 72 hours. Cardiac Enzymes No results for input(s): CKTOTAL, CKMB, CKMBINDEX, TROPONINI in the last 72 hours.  BNP: BNP (last 3 results)  Recent Labs  03/15/15 1820 04/05/15 1537 04/11/15 1020  BNP 195.8* 317.1* 53.9    ProBNP (last 3 results) No results for input(s): PROBNP in the last 8760 hours.   D-Dimer No results for input(s): DDIMER in the last 72 hours. Hemoglobin A1C No results for input(s): HGBA1C in the last 72 hours. Fasting Lipid Panel No results for input(s): CHOL, HDL, LDLCALC, TRIG, CHOLHDL, LDLDIRECT in the last 72 hours. Thyroid Function Tests No results for input(s): TSH, T4TOTAL, T3FREE, THYROIDAB in the last 72 hours.  Invalid input(s): FREET3  Imaging/Studies:  No results found.  Latest Echo  Latest Cath   Medications:     Scheduled Medications: . amiodarone  200 mg Oral BID  . amLODipine  2.5 mg Oral Daily  . atorvastatin  40 mg Oral q1800  . cycloSPORINE  1 drop Both Eyes BID  . donepezil  10 mg Oral QHS  . gabapentin  100 mg Oral TID  . insulin aspart  0-20 Units Subcutaneous TID WC  . insulin aspart  5 Units Subcutaneous TID WC  . insulin glargine  25 Units Subcutaneous q morning - 10a  . insulin glargine  45 Units Subcutaneous QHS  . loratadine  10 mg Oral Daily  . metolazone  5 mg Oral Daily  . mirabegron ER  50 mg Oral Daily  . mometasone-formoterol  2 puff Inhalation BID  . montelukast  10 mg Oral QHS  . mupirocin cream  1 application Topical Daily  . pantoprazole  40 mg Oral BID  . polyethylene glycol  17 g Oral BID   . sertraline  25 mg Oral Daily  . simethicone  80 mg Oral QID  . sodium chloride  3 mL Intravenous Q12H  . tamsulosin  0.4 mg Oral Daily  . Warfarin - Pharmacist Dosing Inpatient   Does not apply q1800    Infusions:    PRN Medications: sodium chloride, acetaminophen, albuterol, diphenhydrAMINE-zinc acetate, hydrALAZINE, ipratropium-albuterol, sodium chloride   Assessment/Plan   Tyler [name] is a 81 y.o. male with history of Chronic diastolic CHF, Echo 03/16/15 Echo EF 55-60%, Grade 2 DD, mildly dilated RV, hx of sick sinus syndrome s/p STJ dual chamber PCM 2013, paroxysmal a fib stable on coumadin, OHS/OSA non-compliant with CPAP, HTN, and DM who presented with worsening SOB, orthopnea, and edema.  1. Acute on chronic diastolic HF, EF 55-60%, grade 2 DD, mildly dilated RV. s/p STJ dual chamber PPM 2013 - Volume status improved from admit. Cr trending up - Resume torsemide at 80 mg daily for chronic dose. - Continue daily weights, strict I/O, and fluid restriction.  - Pt still has some SOB with ~ 20 lbs off. Think this is only one contributing factor - Has HF clinic follow up scheduled for 9/27 2. OSA/OHS - Likely major contributors to his symptoms as well as his RV dysfunction.  - Needs to follow up with pulmonary sleep medicine at discharge. 3. Paroxysmal a fib - CHA2DS2VASC score at least 5 (7.2% risk of stroke per year) - On chronic coumadin - hx of cardioversion - NSR currently.  - Interrogation showed 49% afib burden since 11/02/14 - Started on amio 200 mg BID 9/13, can decreas to 200 mg daily on discharge. - LFTs mildly elevated 9/21. 4. 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 Despite several days of holding diuretics. - Low UO. Will add po torsemide back today as above. 5. Hyperkalemia - Improved. Continue to monitor.   - Now hypo.  3.7 today.  Consider 20 meq today vs surveillance in am  with rise in Cr. 6. HTN 7. Hx of sick sinus syndrome/symptomatic bradycardia - s/p STJ dual chamber PPM 2013 as above. 8. DM 9. Ogilvie's syndrome : Chronic - GI following.  Dispo: Per CSW family has agreed to short term SNF. Arrangements made for [facility]. Is relatively stable apart from rising Cr.   Length of Stay: [address]  Graciella Freer PA-C 04/14/2015, 9:15 AM  Advanced Heart Failure Team Pager [phone] (M-F; 7a - 4p)  Please contact [facility] for night-coverage after hours (4p -7a ) and weekends on [email or web address]   Patient seen with PA, agree with the above note.  Creatinine appears to have plateaued,  diuretics held for a couple of days.  Can restart torsemide 80 mg daily as home dose, follow BMET tomorrow.  Marca Ancona 04/14/2015 12:31 PM

## 2015-04-15 ENCOUNTER — Inpatient Hospital Stay (HOSPITAL_COMMUNITY): Payer: Medicare Other

## 2015-04-15 DIAGNOSIS — N184 Chronic kidney disease, stage 4 (severe): Secondary | ICD-10-CM

## 2015-04-15 LAB — GLUCOSE, CAPILLARY
GLUCOSE-CAPILLARY: 210 mg/dL — AB (ref 65–99)
Glucose-Capillary: 239 mg/dL — ABNORMAL HIGH (ref 65–99)
Glucose-Capillary: 260 mg/dL — ABNORMAL HIGH (ref 65–99)
Glucose-Capillary: 169 mg/dL — ABNORMAL HIGH (ref 65–99)

## 2015-04-15 LAB — BASIC METABOLIC PANEL
Anion gap: 11 (ref 5–15)
BUN: 59 mg/dL — ABNORMAL HIGH (ref 6–20)
CALCIUM: 8.6 mg/dL — AB (ref 8.9–10.3)
CHLORIDE: 90 mmol/L — AB (ref 101–111)
CO2: 35 mmol/L — ABNORMAL HIGH (ref 22–32)
CREATININE: 2.99 mg/dL — AB (ref 0.61–1.24)
GFR, EST AFRICAN AMERICAN: 21 mL/min — AB (ref >60)
GFR, EST NON AFRICAN AMERICAN: 18 mL/min — AB (ref >60)
Glucose, Bld: 216 mg/dL — ABNORMAL HIGH (ref 65–99)
Potassium: 3.6 mmol/L (ref 3.5–5.1)
SODIUM: 136 mmol/L (ref 135–145)

## 2015-04-15 LAB — PROTIME-INR
INR: 2.34 — ABNORMAL HIGH (ref 0.00–1.49)
PROTHROMBIN TIME: 25.4 s — AB (ref 11.6–15.2)

## 2015-04-15 MED ORDER — INSULIN GLARGINE 100 UNIT/ML ~~LOC~~ SOLN
48.0000 [IU] | Freq: Every day | SUBCUTANEOUS | Status: DC
  Administered 2015-04-15 – 2015-04-17 (×3): 48 [IU] via SUBCUTANEOUS
  Filled 2015-04-15 (×4): qty 0.48

## 2015-04-15 MED ORDER — WARFARIN SODIUM 5 MG PO TABS
5.0000 mg | ORAL_TABLET | Freq: Once | ORAL | Status: AC
  Administered 2015-04-15: 5 mg via ORAL
  Filled 2015-04-15: qty 1

## 2015-04-15 NOTE — Care Management Important Message (Signed)
Important Message  Patient Details  Name: Tyler [name] MRN: [id number] Date of Birth: Mar 13, 1934   Medicare Important Message Given:  Yes-fourth notification given    Orson Aloe 04/15/2015, 9:22 AM

## 2015-04-15 NOTE — Progress Notes (Signed)
Advanced Heart Failure Rounding Note  Primary Physician: Dr. Cyndia Bent Primary Cardiologist: Dr [name] HF: [place]  Subjective:    Feels ok today. Breathing feels better.  Moving around more per PT, but still tires quickly.  Abdomen still uncomfortable.  I/O's innaccurate with incontinence. Weight trending down.  Cr trending up (2.37 -> 2.63 ->2.73 ->2.88 -> 2.99). Hold all diuretics. Weight down > 20 lbs overall from admission. 284 -> 263  Objective:   Weight Range: 263 lb (119.296 kg) Body mass index is 37.74 kg/(m^2).   Vital Signs:   Temp:  [97.8 F (36.6 C)-98 F (36.7 C)] 98 F (36.7 C) (09/22 0447) Pulse Rate:  [57-64] 60 (09/22 0447) Resp:  [18-23] 18 (09/22 0447) BP: (117-172)/(40-59) 138/44 mmHg (09/22 0447) SpO2:  [92 %-98 %] 98 % (09/22 0447) Weight:  [263 lb (119.296 kg)] 263 lb (119.296 kg) (09/22 0447) Last BM Date: 04/13/15  Weight change: Filed Weights   04/13/15 0805 04/14/15 0527 04/15/15 0447  Weight: 267 lb 1.6 oz (121.156 kg) 266 lb (120.657 kg) 263 lb (119.296 kg)    Intake/Output:   Intake/Output Summary (Last 24 hours) at 04/15/15 0927 Last data filed at 04/15/15 0820  Gross per 24 hour  Intake 1214.33 ml  Output    850 ml  Net 364.33 ml     Physical Exam: General: Obese, Chronically ill appearing. HEENT: normal Neck: supple. Thick, JVP 9-10 cm. Carotids 2+ bilat; no bruits. No lymphadenopathy or thyromegaly noted. Cor: PMI nondisplaced. RRR. No M/G/R appreciated. Lungs: Diminished at bases. Abdomen: Obese, soft, NT, ++ distended chronically, No HSM appreciated. No bruits or masses.  + bowel sounds, though distant. Extremities: no cyanosis, clubbing, rash. Trace LE edema R>L. Covered wound on R LE.  Neuro: alert & orientedx3, cranial nerves grossly intact. moves all 4 extremities w/o difficulty. Affect flat  Telemetry:  discontinued  Labs: CBC No results for input(s): WBC, NEUTROABS, HGB, HCT, MCV, PLT in the last 72 hours. Basic  Metabolic Panel  Recent Labs  04/14/15 0300 04/15/15 0312  NA 133* 136  K 3.7 3.6  CL 87* 90*  CO2 35* 35*  GLUCOSE 262* 216*  BUN 52* 59*  CALCIUM 8.6* 8.6*   Liver Function Tests  Recent Labs  04/14/15 0300  AST 85*  ALT 84*  ALKPHOS 103  BILITOT 0.7  PROT 7.0  ALBUMIN 3.0*   No results for input(s): LIPASE, AMYLASE in the last 72 hours. Cardiac Enzymes No results for input(s): CKTOTAL, CKMB, CKMBINDEX, TROPONINI in the last 72 hours.  BNP: BNP (last 3 results)  Recent Labs  03/15/15 1820 04/05/15 1537 04/11/15 1020  BNP 195.8* 317.1* 53.9    ProBNP (last 3 results) No results for input(s): PROBNP in the last 8760 hours.   D-Dimer No results for input(s): DDIMER in the last 72 hours. Hemoglobin A1C No results for input(s): HGBA1C in the last 72 hours. Fasting Lipid Panel No results for input(s): CHOL, HDL, LDLCALC, TRIG, CHOLHDL, LDLDIRECT in the last 72 hours. Thyroid Function Tests No results for input(s): TSH, T4TOTAL, T3FREE, THYROIDAB in the last 72 hours.  Invalid input(s): FREET3  Imaging/Studies:  Dg Abd 2 Views  04/15/2015   CLINICAL DATA:  Chronic colonic distention due to Ogilvie's syndrome. Rectal tube in place for decompression. Followup colonic distention.  EXAM: ABDOMEN - 2 VIEW  COMPARISON:  Two-view abdomen x-ray yesterday and earlier.  FINDINGS: Interval colonic decompression after rectal tube placement. Moderate gaseous distention of the sigmoid colon persists. No evidence of  small bowel distention. No evidence of free intraperitoneal air on the lateral decubitus image.  IMPRESSION: Decompression of the colon after rectal tube placement with moderate gaseous distention of the sigmoid colon persisting. No evidence of bowel obstruction or free intraperitoneal air.   Electronically Signed   By: Hulan Saas M.D.   On: 04/15/2015 08:14   Dg Abd 2 Views  04/14/2015   CLINICAL DATA:  Severe abdominal pain. Gaseous abdominal distention.   EXAM: ABDOMEN - 2 VIEW  COMPARISON:  Radiographs dated 04/06/2015 and 06/07/2012 and CT scans dated 03/19/2008.  FINDINGS: The patient has increased chronic gaseous distention of the entire colon, particularly of the mid sigmoid colon in the mid abdomen. The cecum does not appear distended but the mid sigmoid is approximately 14 cm in diameter, uncorrected for magnification. No free air. No dilated small bowel. No acute osseous abnormality.  IMPRESSION: Chronic distention of the colon, increased in the mid sigmoid region as compared to the prior exams. The cecum does not appear dilated. The appearance is not suggestive of sigmoid volvulus.   Electronically Signed   By: Francene Boyers M.D.   On: 04/14/2015 16:51    Latest Echo  Latest Cath   Medications:     Scheduled Medications: . amiodarone  200 mg Oral BID  . amLODipine  2.5 mg Oral Daily  . atorvastatin  40 mg Oral q1800  . cycloSPORINE  1 drop Both Eyes BID  . donepezil  10 mg Oral QHS  . gabapentin  100 mg Oral TID  . insulin aspart  0-20 Units Subcutaneous TID WC  . insulin aspart  10 Units Subcutaneous TID WC  . insulin glargine  25 Units Subcutaneous q morning - 10a  . insulin glargine  45 Units Subcutaneous QHS  . loratadine  10 mg Oral Daily  . mirabegron ER  50 mg Oral Daily  . mometasone-formoterol  2 puff Inhalation BID  . montelukast  10 mg Oral QHS  . mupirocin cream  1 application Topical Daily  . pantoprazole  40 mg Oral BID  . polyethylene glycol  17 g Oral TID  . senna  1 tablet Oral BID  . sertraline  25 mg Oral Daily  . simethicone  80 mg Oral QID  . sodium chloride  3 mL Intravenous Q12H  . tamsulosin  0.4 mg Oral Daily  . Warfarin - Pharmacist Dosing Inpatient   Does not apply q1800    Infusions: . sodium chloride 20 mL/hr at 04/14/15 2344    PRN Medications: sodium chloride, acetaminophen, albuterol, diphenhydrAMINE-zinc acetate, hydrALAZINE, sodium chloride   Assessment/Plan   Tyler L  [name] is a 81 y.o. male with history of Chronic diastolic CHF, Echo 03/16/15 Echo EF 55-60%, Grade 2 DD, mildly dilated RV, hx of sick sinus syndrome s/p STJ dual chamber PCM 2013, paroxysmal a fib stable on coumadin, OHS/OSA non-compliant with CPAP, HTN, and DM who presented with worsening SOB, orthopnea, and edema.  1. Acute on chronic diastolic HF, EF 55-60%, grade 2 DD, mildly dilated RV. s/p STJ dual chamber PPM 2013 - Volume status improved from admit. Cr continues to trend up - Hold all diuretics - Continue daily weights, strict I/O, and fluid restriction.  - Pt still has some SOB despite ~ 20 lbs off. Think this is only one contributing factor - Has HF clinic follow up scheduled for 9/27 2. OSA/OHS - Likely major contributors to his symptoms as well as his RV dysfunction.  - Needs to follow  up with pulmonary sleep medicine at discharge. 3. Paroxysmal a fib - CHA2DS2VASC score at least 5 (7.2% risk of stroke per year) - On chronic coumadin - hx of cardioversion - Regular rhythm on exam. - Interrogation showed 49% afib burden since 11/02/14 - Started on amio 200 mg BID 9/13, can decrease to 200 mg daily on discharge. - LFTs mildly elevated 9/21. 4. 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 Hold all diuretics. 5. Hyperkalemia - Improved. Continue to monitor.  Corrected to hypo.  Supp as needed. 6. HTN 7. Hx of sick sinus syndrome/symptomatic bradycardia - s/p STJ dual chamber PPM 2013 as above. 8. DM 9. Ogilvie's syndrome : Chronic - GI following.  Dispo: Per CSW family has agreed to short term SNF. Arrangements for [place], but per CSW note family no longer wishes for him to go there. They are awaiting alternate decision.  Has follow up in HF clinic on 9/27.  Length of Stay: 10  Graciella Freer PA-C 04/15/2015, 9:27 AM  Advanced Heart Failure Team Pager [phone] (M-F; 7a - 4p)  Please contact  [facility] for night-coverage after hours (4p -7a ) and weekends on [email or web address]   Patient seen with PA, agree with the above note.  Abdominal distention somewhat improved with rectal tube and flex sig decompression.  I think that this continues to be his main issue.    Creatinine is up slightly again, would just hold all diuretics for now.  He should NOT be on metolazone.  Eventually, he will need to go back on diuretic but will need to wait until creatinine is coming down at this point.  He was on torsemide 50 mg daily at home and that may end up being the dose to aim for eventually.    We will follow at a distance from this point, call with questions.   Marca Ancona 04/15/2015 11:50 AM

## 2015-04-15 NOTE — Progress Notes (Signed)
[facility] Progress Note  Subjective:  S/P flex sig with colonic decompression last pm. Films this morning with slight improvement.     Objective:  Vital signs in last 24 hours: Temp:  [97.8 F (36.6 C)-98 F (36.7 C)] 98 F (36.7 C) (09/22 0447) Pulse Rate:  [57-61] 60 (09/22 0447) Resp:  [18-23] 18 (09/22 0447) BP: (117-172)/(40-59) 138/44 mmHg (09/22 0447) SpO2:  [93 %-98 %] 98 % (09/22 0447) Weight:  [263 lb (119.296 kg)] 263 lb (119.296 kg) (09/22 0447) Last BM Date: 04/13/15 General:   Alert,  Well-developed,  in NAD, lying supine Heart:  Regular rate and rhythm; no murmurs Pulm;diminished breath sounds bilat Abdomen:  Soft, distended but less than yesterday,, mild diffuse tenderness, hypoactive BS Extremities:  Without edema. Neurologic:  Alert and  oriented x4;  grossly normal neurologically. Psych:  Alert and cooperative. Normal mood and affect.  Intake/Output from previous day: 09/21 0701 - 09/22 0700 In: 1214.3 [P.O.:1106; I.V.:108.3] Out: 550 [Urine:550] Intake/Output this shift: Total I/O In: 840 [P.O.:840] Out: 300 [Urine:300]  BMET  Recent Labs  04/13/15 0431 04/14/15 0300 04/15/15 0312  NA 132* 133* 136  K 4.0 3.7 3.6  CL 85* 87* 90*  CO2 36* 35* 35*  GLUCOSE 318* 262* 216*  BUN 50* 52* 59*  CREATININE 2.73* 2.88* 2.99*  CALCIUM 8.7* 8.6* 8.6*   LFT  Recent Labs  04/14/15 0300  PROT 7.0  ALBUMIN 3.0*  AST 85*  ALT 84*  ALKPHOS 103  BILITOT 0.7   PT/INR  Recent Labs  04/14/15 0300 04/15/15 0312  LABPROT 25.5* 25.4*  INR 2.35* 2.34*     Dg Abd 2 Views  04/15/2015   CLINICAL DATA:  Chronic colonic distention due to Ogilvie's syndrome. Rectal tube in place for decompression. Followup colonic distention.  EXAM: ABDOMEN - 2 VIEW  COMPARISON:  Two-view abdomen x-ray yesterday and earlier.  FINDINGS: Interval colonic decompression after rectal tube placement. Moderate gaseous distention of the sigmoid colon  persists. No evidence of small bowel distention. No evidence of free intraperitoneal air on the lateral decubitus image.  IMPRESSION: Decompression of the colon after rectal tube placement with moderate gaseous distention of the sigmoid colon persisting. No evidence of bowel obstruction or free intraperitoneal air.   Electronically Signed   By: Hulan Saas M.D.   On: 04/15/2015 08:14   Dg Abd 2 Views  04/14/2015   CLINICAL DATA:  Severe abdominal pain. Gaseous abdominal distention.  EXAM: ABDOMEN - 2 VIEW  COMPARISON:  Radiographs dated 04/06/2015 and 06/07/2012 and CT scans dated 03/19/2008.  FINDINGS: The patient has increased chronic gaseous distention of the entire colon, particularly of the mid sigmoid colon in the mid abdomen. The cecum does not appear distended but the mid sigmoid is approximately 14 cm in diameter, uncorrected for magnification. No free air. No dilated small bowel. No acute osseous abnormality.  IMPRESSION: Chronic distention of the colon, increased in the mid sigmoid region as compared to the prior exams. The cecum does not appear dilated. The appearance is not suggestive of sigmoid volvulus.   Electronically Signed   By: Francene Boyers M.D.   On: 04/14/2015 16:51    ASSESSMENT/PLAN:  81 yo male  With longstanding history of [place], s/p flex sig with decompression last pm. Films today with some improvement, but pt is supine and not moving. Enc pt to lie on left side for 2 hours, then right side, etc, to sit in chair and to try  to ambulate with pt. Spoke to pts nurse who said she has reinforced this with pt but he is reluctanct to move. Continue bowel regimen of miralax and sennakot. Rectal tube.      LOS: 10 days   Hvozdovic, Moise Boring 04/15/2015, Pager [phone]

## 2015-04-15 NOTE — Progress Notes (Addendum)
ANTICOAGULATION CONSULT NOTE - Follow Up Consult  Pharmacy Consult for Coumadin Indication: atrial fibrillation   Patient Measurements: Height:  (177.8 cm) Weight: 263 lb (119.296 kg) IBW/kg (Calculated) : 73  Vital Signs: Temp: 98 F (36.7 C) (09/22 0447) Temp Source: Oral (09/22 0447) BP: 138/44 mmHg (09/22 0447) Pulse Rate: 60 (09/22 0447)  Labs:  Recent Labs  04/13/15 0431 04/14/15 0300 04/15/15 0312  LABPROT 25.6* 25.5* 25.4*  INR 2.37* 2.35* 2.34*  CREATININE 2.73* 2.88* 2.99*    Estimated Creatinine Clearance: 25.9 mL/min (by C-G formula based on Cr of 2.99).  Assessment: Tyler [name] continues on coumadin for afib. PTA was on 7.5 mg daily but INR continues to be therapeutic at 2.34 with 5 mg daily for last 6 days. He is also on amiodarone  po bid which is new for him but have yet to see a significant drug interaction. He may need  a couple of times a week alternating with 7.5 at discharge.  Goal of Therapy:  INR 2-3 Monitor platelets by anticoagulation protocol: Yes   Plan:  Repeat Coumadin  x 1 Daily INR  Sherron Monday, PharmD Clinical Pharmacy Resident Pager: [phone] 04/15/2015 9:32 AM

## 2015-04-15 NOTE — Clinical Documentation Improvement (Signed)
Internal Medicine  Abnormal Lab/Test Results:   Component     Latest Ref Rng 04/12/2015 04/13/2015 04/14/2015 04/15/2015             Sodium     135 - 145 mmol/L 132 (L) 132 (L) 133 (L) 136  Chloride     101 - 111 mmol/L 84 (L) 85 (L) 87 (L) 90 (L)    Possible Clinical Conditions associated with below indicators  Hyponatremia  Other Condition  Cannot Clinically Determine   Supporting Information:  Treatment Provided: Infusions: . sodium chloride 20 mL/hr at 04/14/15 2344          Please exercise your independent, professional judgment when responding. A specific answer is not anticipated or expected.   Thank You,  Andy Gauss Health Information Management [place] [phone]

## 2015-04-15 NOTE — Progress Notes (Signed)
Occupational Therapy Treatment Patient Details Name: KIRT CHEW MRN: [id number] DOB: 1934/02/15 Today's Date: 04/15/2015    History of present illness 81 y.o. male admitted with dyspnea and ileus. PMH is significant for CHF, SSS s/p pacemaker, a flutter/fib on warfarin, CKD, HTN, T2DM, HLD, BPH, Asthma, OSA, anemia   OT comments  BUE Ex goal added  Follow Up Recommendations  SNF    Equipment Recommendations  None recommended by OT    Recommendations for Other Services      Precautions / Restrictions Precautions Precautions: Fall Precaution Comments: pt with h/o falls and progressive decline in his mobility.        Mobility Bed Mobility                NT  Transfers        NT              Balance                                   ADL Overall ADL's : Needs assistance/impaired                                       General ADL Comments: Pt did not agree to OOB or sitting EOB but did agree to BUE exercise. Goal added      Vision                     Perception     Praxis      Cognition                             Extremity/Trunk Assessment               Exercises General Exercises - Upper Extremity Shoulder Flexion: AAROM;Both;10 reps;Supine (within available ROM) Elbow Flexion: AAROM;AROM;Both;10 reps;Supine Elbow Extension: AROM;AAROM;Both;10 reps;Supine   Shoulder Instructions       General Comments      Pertinent Vitals/ Pain       Pain Assessment: No/denies pain  Home Living                                          Prior Functioning/Environment              Frequency Min 2X/week     Progress Toward Goals  OT Goals(current goals can now be found in the care plan section)  Progress towards OT goals: Progressing toward goals  ADL Goals Pt/caregiver will Perform Home Exercise Program: Both right and left upper extremity;With minimal  assist;Increased strength;Increased ROM;With written HEP provided  Plan Discharge plan remains appropriate    Co-evaluation                 End of Session Equipment Utilized During Treatment: Oxygen   Activity Tolerance Patient limited by fatigue   Patient Left in bed;with call bell/phone within reach;with bed alarm set   Nurse Communication Mobility status        Time: 4403-4742 OT Time Calculation (min): 9 min  Charges: OT General Charges $OT Visit: 1 Procedure OT Treatments $Therapeutic Exercise: 8-22 mins  REDDING, Karin Golden D 04/15/2015, 11:14 AM

## 2015-04-15 NOTE — Progress Notes (Signed)
CSW spoke to daughter [name] this morning. She has toured [place] and has decided not to seek placement there. Family has decided to accept bed at [facility]. Patient is not medically stable for d/c at this time. Now has a rectal tube in place. CSW services will continue to monitor.  Lorri Frederick. Jaci Lazier, [place] [phone]

## 2015-04-15 NOTE — Progress Notes (Signed)
Family Medicine Teaching Service Daily Progress Note Intern Pager: [phone]  Patient name: Tyler [name] Medical record number: [id number] Date of birth: 13-May-1934 Age: 81 y.o. Gender: male  Primary Care Kanae Ignatowski: [name], MD Consultants: Cardiology Code Status: FULL  Pt Overview and Major Events to Date:  9/12: Admitted for shortness of breath concerning for CHF exacerbation, Wt 291 9/13: Continued on Lasix drip. Wt 280lbs. Amiodarone started for PAF. 9/14: Continued Lasix drip. Wt 281lbs. 9/15: Continued Lasix drip, 1 dose Metolazone. 9/16: Wt down to 273lbs.  9/18: Wt down to 275 lbs. Switched to PO diuretics. Creatinine creeping up. Constipation continues. Repeat enema 9/20: Constipation continues; ordered milk and molases enema  9/21: Reconsulted GI; repeat KUB showed chronic distention of colon, increased in the mid sigmoid region--> unprepped flex sig for decompression  9/22: repeat KUB:  Decompression of the colon after rectal tube placement with moderate gaseous distention of the sigmoid colon persisting. No evidence of bowel obstruction or free intraperitoneal air.   Assessment and Plan: Tyler [name] is a 81 y.o. male presenting with recurrent hypoxemic respiratory failure from acute CHF exacerbation. PMH is significant for HFpEF, HTN, HLD, T2DM, OSA, OHS, SSS and PAF s/p dual chamber pacer 2013.  Acute on Chronic Diastolic HF, HFpEF: Echo on most recent admission with EF 55-60%, G2DD. Wt 291>>275 > 268>>264>>>>267>>266>263. Urine output: slightly improved today with and x1 unmeasured over 24 hrs, with net diuresis -4.6L since admission. Concern given rising creatinine, he has some degree of cardiorenal syndrome.  - Heart Failure team following: Torsemide  resumed 9/21 - Patient continues to receive Metolazone; will touch base with HF regarding this - Daily weights, strict I/O, fluid restriction - Holding beta blocker given low suspicion for ACS  and acute CHF exacerbation - Continue atorvastatin 40 mg PO daily - per HF team has clinic follow up 9/27  Stage III CKD: Presumed baseline ~2 Scr, most recently trended downward with diuresis but has been steadily creeping back up. He is diuresing, but concern for cardiorenal syndrome given this trend. Cr 2.37>2.63>2.73>2.88>2.99 - Continue to monitor  - Milk of Magnesia outpatient prn constipation treatment should be discontinued at discharge due to SCr persistently >2mg /dl.  Acute Hypoxemic Respiratory Failure: Multifactorial with volume overload and abdominal distention worsening pre-existing asthma, OHS, OSA, and 20 pack/year smoking history with likely undiagnosed COPD. Required O2 2L initially, now on RA with good saturations. Will continue to follow.  - ordered O2 pulse ox with ambulation to determine if patient needs home O2 - Continue allergy/reactive airway therapy: flonase, loratadine, singulair, formulary LABA/ICS, PRN albuterol - Duonebs q6h PRN for wheezing  - Patient will need CPAP on outpatient basis  Ogilvie's Syndrome: Very chronic problem with neg abd U/S, possible adynamic ileus on KUB. Repeat KUB 9/21 showed chronic distention of the colon, increased in the mid sigmoid region. Repeat KUB 9/22: Decompression of the colon after rectal tube placement with moderate gaseous distention of the sigmoid colon persisting. No evidence of bowel obstruction or free intraperitoneal air.  - GI reconsulted, appreciate recs: unprepped flex sig for decompression which showed colonic ileus severely dilated sigmoid with no current evidence of mucosal injury; irrigation/lavage of left colon.   - recommendations: place rectal tube, frequent turning while in bed, repeat x-ray 9/22 AM, Maximize ambulation, maximize bowel regimen - Miralax 17g TID (9/21) - Sennakot 8.6mg  BID - Continue simethicone and PPI - Hold anticholinergics on discharge  Heart Burn: Chronic issue per patient - Protonix   BID -  will monitor symptoms   Rash: With history of cutaneous hypersensitivity to detergents, suspect gown as cause. Improved somewhat since changing gown. No rash today. - Continue to follow.   Hypertension: Blood pressure in range 117-161/50-56 - Continue Norvasc 2.5 mg PO daily  - Hydralazine  IV q2h PRN for SBP > 170. Would avoid this if possible given the possibility of cardiorenal syndrome.   Type II Diabetes Mellitus, Insulin-Dependent: Well, possibly too tightly, controlled - Hb A1c 6.6% on 9/8. Home dose is 27u qAM, 45u qPM + SSI. Recent CBGs climbing significantly with return of appetite. CBG 146-316 over 24 hours, with AM CBG 239. Required 23 units for SSI  - AM lantus 25u AM; 45u PM  - Novolog 10 units TID with meals - continue resistant SSI  Arrhythmias: Sick Sinus, Paroxysmal AFib/Flutter:  - On coumadin per pharmacy, amiodarone 200 mg BID started (9/13) - Per HF recommendations Started on amio 200 mg BID 9/13, can decrease to 200 mg daily on discharge  Chronic Pain: - Continue to hold narcotics as can be contributing to constipation - Discontinued home mobic given CHF - Tylenol PRN pain  Social: Patient is being treated for cognitive impairment. Tyler [name] daughter, [name], requests daily updates to which the patient has consented verbally.She had been told that definitive plans for the day can be relayed to her around 1:45 - 2:00pm daily after rounds and noon conference. - Continue donepezil - Family does not want pt to go to [facility]; is looking for other facilities   History of recurrent Klebsiella UTI due to Bladder outlet obstruction from BPH: No evidence of infection at this time with good UOP  - Low threshold for repeat U/A and bladder scan if symptoms recur or urinary retention suspected. Consider further urologic investigation as outpatient.  - Continue myrbetriq, flomax. Hold cardura as redundant therapy. - Hold elavil for concern of  worsening retention.  Chronic RLE wound: Followed at wound care clinic as outpatient.  - WOC recommendations appreciated.  FEN/GI: Heart healthy/carb modified diet, saline lock IV Prophylaxis: Therapeutic coumadin  Disposition: Continue diuresis, may be stable for D/C 9/19 when he will be discharged back to [facility] due to daughters' adamant request (SNF is thought to be more ideal dispo).   Subjective:  Patient states he is doing okay today. He feels less bloated after the decompression yesterday. Notes he has a little abdominal cramping overnight. No nausea. Eating well.    Objective: Temp:  [97.8 F (36.6 C)-98 F (36.7 C)] 98 F (36.7 C) (09/22 0447) Pulse Rate:  [57-64] 60 (09/22 0447) Resp:  [18-23] 18 (09/22 0447) BP: (117-172)/(40-59) 138/44 mmHg (09/22 0447) SpO2:  [92 %-98 %] 98 % (09/22 0447) Weight:  [263 lb (119.296 kg)] 263 lb (119.296 kg) (09/22 0447) Physical Exam: Gen: Pleasant elderly male CV: RRR, no murmur, no LE edema, no JVD Pulm: Nonlabored, decreased sounds at bases bilaterally, crackles noted at bases bilaterally but improved from yesterday Abd: BS hypoactive but improved, Slightly less distended this morning. NT  Extremities: RLE bandaged (chronic wound): clean and dry. No LE edema Neuro: Oriented, speech normal  Laboratory:  Recent Labs Lab 04/09/15 0426 04/10/15 0420 04/11/15 0444  WBC 7.1 6.1 6.3  HGB 10.5* 10.6* 11.1*  HCT 32.4* 33.3* 34.5*  PLT 162 166 175    Recent Labs Lab 04/13/15 0431 04/14/15 0300 04/15/15 0312  NA 132* 133* 136  K 4.0 3.7 3.6  CL 85* 87* 90*  CO2 36* 35*  35*  BUN 50* 52* 59*  CREATININE 2.73* 2.88* 2.99*  CALCIUM 8.7* 8.6* 8.6*  PROT  --  7.0  --   BILITOT  --  0.7  --   ALKPHOS  --  103  --   ALT  --  84*  --   AST  --  85*  --   GLUCOSE 318* 262* 216*    Imaging/Diagnostic Tests: 9/21 KUB:  chronic distention of colon, increased in the mid sigmoid region--> unprepped flex sig for  decompression    Palma Holter, MD 04/15/2015, 6:54 AM PGY-1, [place] [facility] FPTS Intern pager: [phone], text pages welcome

## 2015-04-16 ENCOUNTER — Inpatient Hospital Stay (HOSPITAL_COMMUNITY): Payer: Medicare Other

## 2015-04-16 ENCOUNTER — Encounter (HOSPITAL_COMMUNITY): Payer: Self-pay | Admitting: Internal Medicine

## 2015-04-16 DIAGNOSIS — E871 Hypo-osmolality and hyponatremia: Secondary | ICD-10-CM | POA: Diagnosis present

## 2015-04-16 DIAGNOSIS — K599 Functional intestinal disorder, unspecified: Secondary | ICD-10-CM

## 2015-04-16 DIAGNOSIS — K5989 Other specified functional intestinal disorders: Secondary | ICD-10-CM | POA: Insufficient documentation

## 2015-04-16 DIAGNOSIS — E1143 Type 2 diabetes mellitus with diabetic autonomic (poly)neuropathy: Secondary | ICD-10-CM | POA: Insufficient documentation

## 2015-04-16 DIAGNOSIS — R1084 Generalized abdominal pain: Secondary | ICD-10-CM

## 2015-04-16 DIAGNOSIS — K598 Other specified functional intestinal disorders: Secondary | ICD-10-CM

## 2015-04-16 DIAGNOSIS — R109 Unspecified abdominal pain: Secondary | ICD-10-CM | POA: Insufficient documentation

## 2015-04-16 LAB — BASIC METABOLIC PANEL
ANION GAP: 11 (ref 5–15)
BUN: 50 mg/dL — ABNORMAL HIGH (ref 6–20)
CALCIUM: 8.7 mg/dL — AB (ref 8.9–10.3)
CHLORIDE: 93 mmol/L — AB (ref 101–111)
CO2: 34 mmol/L — AB (ref 22–32)
Creatinine, Ser: 2.54 mg/dL — ABNORMAL HIGH (ref 0.61–1.24)
GFR calc non Af Amer: 22 mL/min — ABNORMAL LOW (ref >60)
GFR, EST AFRICAN AMERICAN: 26 mL/min — AB (ref >60)
Glucose, Bld: 143 mg/dL — ABNORMAL HIGH (ref 65–99)
POTASSIUM: 3.5 mmol/L (ref 3.5–5.1)
Sodium: 138 mmol/L (ref 135–145)

## 2015-04-16 LAB — URINE MICROSCOPIC-ADD ON

## 2015-04-16 LAB — URINALYSIS, ROUTINE W REFLEX MICROSCOPIC
BILIRUBIN URINE: NEGATIVE
GLUCOSE, UA: NEGATIVE mg/dL
KETONES UR: NEGATIVE mg/dL
Nitrite: NEGATIVE
PH: 7 (ref 5.0–8.0)
Protein, ur: 30 mg/dL — AB
Specific Gravity, Urine: 1.012 (ref 1.005–1.030)
Urobilinogen, UA: 1 mg/dL (ref 0.0–1.0)

## 2015-04-16 LAB — GLUCOSE, CAPILLARY
GLUCOSE-CAPILLARY: 134 mg/dL — AB (ref 65–99)
GLUCOSE-CAPILLARY: 136 mg/dL — AB (ref 65–99)
GLUCOSE-CAPILLARY: 227 mg/dL — AB (ref 65–99)
Glucose-Capillary: 183 mg/dL — ABNORMAL HIGH (ref 65–99)

## 2015-04-16 LAB — PROTIME-INR
INR: 2.5 — ABNORMAL HIGH (ref 0.00–1.49)
Prothrombin Time: 26.7 seconds — ABNORMAL HIGH (ref 11.6–15.2)

## 2015-04-16 MED ORDER — ONDANSETRON HCL 4 MG PO TABS: 8.0000 mg | ORAL_TABLET | Freq: Three times a day (TID) | ORAL | Status: DC | PRN

## 2015-04-16 MED ORDER — SORBITOL 70 % SOLN
960.0000 mL | TOPICAL_OIL | Freq: Once | ORAL | Status: AC
  Administered 2015-04-16: 960 mL via RECTAL
  Filled 2015-04-16: qty 240

## 2015-04-16 MED ORDER — WARFARIN SODIUM 5 MG PO TABS
5.0000 mg | ORAL_TABLET | Freq: Every day | ORAL | Status: DC
  Administered 2015-04-16 – 2015-04-17 (×2): 5 mg via ORAL
  Filled 2015-04-16 (×4): qty 1

## 2015-04-16 NOTE — Progress Notes (Signed)
CSW (Clinical Child psychotherapist) continues to work with pt and pt family. Pt does have a bed available at [facility] if ready for weekend dc.  Poonum Ambelal, LCSW [phone]

## 2015-04-16 NOTE — Care Management Important Message (Signed)
Important Message  Patient Details  Name: SHONN FARRUGGIA MRN: [id number] Date of Birth: 1934-06-16   Medicare Important Message Given:  Yes-fourth notification given    Bernadette Hoit 04/16/2015, 10:43 AM

## 2015-04-16 NOTE — Progress Notes (Signed)
ANTICOAGULATION CONSULT NOTE - Follow Up Consult  Pharmacy Consult for Coumadin Indication: atrial fibrillation   Patient Measurements: Height:  (177.8 cm) Weight: 263 lb 3.7 oz (119.4 kg) IBW/kg (Calculated) : 73  Vital Signs: Temp: 97.9 F (36.6 C) (09/23 0355) Temp Source: Oral (09/23 0355) BP: 127/88 mmHg (09/23 0355) Pulse Rate: 68 (09/23 0355)  Labs:  Recent Labs  04/14/15 0300 04/15/15 0312 04/16/15 0445  LABPROT 25.5* 25.4* 26.7*  INR 2.35* 2.34* 2.50*  CREATININE 2.88* 2.99* 2.54*    Estimated Creatinine Clearance: 30.6 mL/min (by C-G formula based on Cr of 2.54).  Assessment:  81yom continues on coumadin for afib. PTA was on 7.5 mg daily but INR continues to be therapeutic at 2.5 with 5 mg daily for last 6 days. He is also on amiodarone  po bid which is new for him but have yet to see a significant drug interaction. He may need  a couple of times a week alternating with 7.5 at discharge. Going to try  qday for now to see.   Goal of Therapy:  INR 2-3 Monitor platelets by anticoagulation protocol: Yes   Plan:   Coumadin  PO qday Daily INR for now  Ulyses Southward, PharmD Pager: [phone] 04/16/2015 10:20 AM

## 2015-04-16 NOTE — Progress Notes (Signed)
Physical Therapy Treatment Patient Details Name: Tyler [name] MRN: [id number] DOB: Mar 06, 1934 Today's Date: 04/16/2015    History of Present Illness 81 y.o. male admitted with dyspnea and ileus. PMH is significant for CHF, SSS s/p pacemaker, a flutter/fib on warfarin, CKD, HTN, T2DM, HLD, BPH, Asthma, OSA, anemia    PT Comments    [name] is able to ambulate short distance again today with significant encouragement and HEP prior to mobility as pt believes HEP first strengthens his leg and is the only way he can walk. Pt educated for HEP, transfers and gait. RN present to assist with safety throughout. Will continue to follow.   Follow Up Recommendations  SNF;Supervision for mobility/OOB     Equipment Recommendations       Recommendations for Other Services       Precautions / Restrictions Precautions Precautions: Fall Precaution Comments: pt with h/o falls and progressive decline in his mobility. rectal tube Restrictions Weight Bearing Restrictions: No    Mobility  Bed Mobility Overal bed mobility: Needs Assistance Bed Mobility: Supine to Sit     Supine to sit: Min assist;HOB elevated     General bed mobility comments: max cues for sequence with assist to use RUE to reach for left rail and assist to rotate pelvis to EOB with pad  Transfers Overall transfer level: Needs assistance   Transfers: Sit to/from Stand Sit to Stand: Mod assist;+2 safety/equipment         General transfer comment: sit to stand x3, assisted to come forward; cues for hand placement and safety  Ambulation/Gait Ambulation/Gait assistance: Min assist;+2 safety/equipment Ambulation Distance (Feet): 12 Feet Assistive device: Rolling walker (2 wheeled) Gait Pattern/deviations: Trunk flexed;Shuffle;Wide base of support   Gait velocity interpretation: Below normal speed for age/gender General Gait Details: pt maintains forward head, flexed posture and shuffle steps with 12' gait to  chair, seated rest then return gait trial of 12'. Pt sitting before fully to surface with mod assist to move pelvis in front of surface   Stairs            Wheelchair Mobility    Modified Rankin (Stroke Patients Only)       Balance Overall balance assessment: Needs assistance   Sitting balance-Leahy Scale: Fair       Standing balance-Leahy Scale: Poor                      Cognition Arousal/Alertness: Awake/alert Behavior During Therapy: Flat affect Overall Cognitive Status: Impaired/Different from baseline Area of Impairment: Problem solving             Problem Solving: Slow processing;Decreased initiation;Requires verbal cues      Exercises General Exercises - Lower Extremity Long Arc Quad: AROM;Both;Seated;15 reps Heel Slides: AROM;Right;Seated;10 reps (resistive pushing out from heel slide) Hip Flexion/Marching: AROM;Strengthening;10 reps;Seated;Both    General Comments        Pertinent Vitals/Pain Pain Assessment: No/denies pain    Home Living                      Prior Function            PT Goals (current goals can now be found in the care plan section) Progress towards PT goals: Progressing toward goals    Frequency       PT Plan Current plan remains appropriate    Co-evaluation             End of Session Equipment Utilized  During Treatment: Gait belt Activity Tolerance: Patient tolerated treatment well Patient left: in chair;with call bell/phone within reach;with chair alarm set;with nursing/sitter in room     Time: 1150-1215 PT Time Calculation (min) (ACUTE ONLY): 25 min  Charges:  $Gait Training: 8-22 mins $Therapeutic Exercise: 8-22 mins                    G Codes:      Delorse Lek 05-03-2015, 12:19 PM Delaney Meigs, PT [phone]

## 2015-04-16 NOTE — Progress Notes (Signed)
[facility] Progress Note  Subjective:  Remains distended. C/o some discomfort from rectal tube. Tube with small amount stool. Pt says he is passing very little gas. Nauseous but has not vomited.    Objective:  Vital signs in last 24 hours: Temp:  [97.9 F (36.6 C)-98.2 F (36.8 C)] 97.9 F (36.6 C) (09/23 0355) Pulse Rate:  [60-68] 68 (09/23 0355) Resp:  [16-18] 18 (09/23 0355) BP: (127-146)/(51-88) 127/88 mmHg (09/23 0355) SpO2:  [90 %-98 %] 90 % (09/23 0926) Weight:  [263 lb 3.7 oz (119.4 kg)] 263 lb 3.7 oz (119.4 kg) (09/23 0355) Last BM Date: 04/14/15 General:   Alert,  Well-developed, lying supine Abdomen:  Distended, mild diffuse tenderness, hypoactive bowel sounds   Extremities:  Without edema. Neurologic: Alert and  oriented x4;  grossly normal neurologically. Psych: Alert and cooperative. Normal mood and affect.  Intake/Output from previous day: 09/22 0701 - 09/23 0700 In: 2082 [P.O.:2082] Out: 700 [Urine:700] Intake/Output this shift: Total I/O In: -  Out: 350 [Urine:350]  Lab Results: No results for input(s): WBC, HGB, HCT, PLT in the last 72 hours. BMET  Recent Labs  04/14/15 0300 04/15/15 0312 04/16/15 0445  NA 133* 136 138  K 3.7 3.6 3.5  CL 87* 90* 93*  CO2 35* 35* 34*  GLUCOSE 262* 216* 143*  BUN 52* 59* 50*  CREATININE 2.88* 2.99* 2.54*  CALCIUM 8.6* 8.6* 8.7*   LFT  Recent Labs  04/14/15 0300  PROT 7.0  ALBUMIN 3.0*  AST 85*  ALT 84*  ALKPHOS 103  BILITOT 0.7   PT/INR  Recent Labs  04/15/15 0312 04/16/15 0445  LABPROT 25.4* 26.7*  INR 2.34* 2.50*    Dg Abd 2 Views  04/15/2015   CLINICAL DATA:  Chronic colonic distention due to Ogilvie's syndrome. Rectal tube in place for decompression. Followup colonic distention.  EXAM: ABDOMEN - 2 VIEW  COMPARISON:  Two-view abdomen x-ray yesterday and earlier.  FINDINGS: Interval colonic decompression after rectal tube placement. Moderate gaseous distention of the  sigmoid colon persists. No evidence of small bowel distention. No evidence of free intraperitoneal air on the lateral decubitus image.  IMPRESSION: Decompression of the colon after rectal tube placement with moderate gaseous distention of the sigmoid colon persisting. No evidence of bowel obstruction or free intraperitoneal air.   Electronically Signed   By: Hulan Saas M.D.   On: 04/15/2015 08:14   Dg Abd 2 Views  04/14/2015   CLINICAL DATA:  Severe abdominal pain. Gaseous abdominal distention.  EXAM: ABDOMEN - 2 VIEW  COMPARISON:  Radiographs dated 04/06/2015 and 06/07/2012 and CT scans dated 03/19/2008.  FINDINGS: The patient has increased chronic gaseous distention of the entire colon, particularly of the mid sigmoid colon in the mid abdomen. The cecum does not appear distended but the mid sigmoid is approximately 14 cm in diameter, uncorrected for magnification. No free air. No dilated small bowel. No acute osseous abnormality.  IMPRESSION: Chronic distention of the colon, increased in the mid sigmoid region as compared to the prior exams. The cecum does not appear dilated. The appearance is not suggestive of sigmoid volvulus.   Electronically Signed   By: Francene Boyers M.D.   On: 04/14/2015 16:51    ASSESSMENT/PLAN:   81 yo malewith longstanding history of [place], s/p flex sig with decompression and placement of rectal tube 2 days ago.Was noted to have some improvement yesterday, but today is more distended.  Will recheck abd film today. Continue bowel  regimen at this time.     LOS: 11 days   Hvozdovic, Tollie Pizza PA-C 04/16/2015, Pager [phone]  Addendum: Abd film today:   FINDINGS: Air-filled dilatation of the colon is now noted which is increased since prior exam. No small bowel dilatation is noted. No abnormal calcifications are noted.  IMPRESSION: Air-filled dilated colon is noted most consistent with ileus.   Will d/c rectal tube and order smog enema.

## 2015-04-16 NOTE — Progress Notes (Signed)
Pt a/o, no c/o pain, pt oob with PT and with nursing, pt had repeat abd CT d/t abd distention, pt has sm fecal output from flexiseal

## 2015-04-16 NOTE — Progress Notes (Signed)
Family Medicine Teaching Service Daily Progress Note Intern Pager: [phone]  Patient name: Tyler [name] Medical record number: [id number] Date of birth: 25-Nov-1933 Age: 81 y.o. Gender: male  Primary Care Provider: [name], MD Consultants: Cardiology Code Status: FULL  Pt Overview and Major Events to Date:  9/12: Admitted for shortness of breath concerning for CHF exacerbation, Wt 291 9/13: Continued on Lasix drip. Wt 280lbs. Amiodarone started for PAF. 9/14: Continued Lasix drip. Wt 281lbs. 9/15: Continued Lasix drip, 1 dose Metolazone. 9/16: Wt down to 273lbs.  9/18: Wt down to 275 lbs. Switched to PO diuretics. Creatinine creeping up. Constipation continues. Repeat enema 9/20: Constipation continues; ordered milk and molases enema  9/21: Reconsulted GI; repeat KUB showed chronic distention of colon, increased in the mid sigmoid region--> unprepped flex sig for decompression  9/22: repeat KUB: Decompression of the colon after rectal tube placement with moderate gaseous distention of the sigmoid colon persisting. No evidence of bowel obstruction or free intraperitoneal air.   Assessment and Plan: Karen L Vandyne is a 81 y.o. male presenting with recurrent hypoxemic respiratory failure from acute CHF exacerbation. PMH is significant for HFpEF, HTN, HLD, T2DM, OSA, OHS, SSS and PAF s/p dual chamber pacer 2013.  Acute on Chronic Diastolic HF, HFpEF: Echo on most recent admission with EF 55-60%, G2DD. Wt 291>>>268>>264>>>267>>266>263>263. (279lb at d/c 8/24) Urine output: slightly improved today with w/ 3x unmeasured over 24 hrs, with net diuresis -3.219L since admission (slowly decreasing). Concern given rising creatinine, he has some degree of cardiorenal syndrome.  - Heart Failure team following, appreciate recs: Torsemide  held due to increasing Cr - Daily weights, strict I/O, fluid restriction - Holding beta blocker given low suspicion for ACS and acute  CHF exacerbation - Continue atorvastatin 40 mg PO daily - per HF team has clinic follow up 9/27 - currently on IVF 75ml/hr  AKI on Stage III CKD: Presumed baseline ~2 Scr, most recently trended downward with diuresis but has been steadily creeping back up. He is diuresing, but concern for cardiorenal syndrome given this trend. Cr 2.37>2.63>2.73>2.88>2.99> 2.54. Mild elevation of bicarb 34: possible mild respiratory acidosis vs metabolic alkalosis.  - Continue to monitor  - Milk of Magnesia outpatient prn constipation treatment should be discontinued at discharge due to SCr persistently >2mg /dl.  Acute Hypoxemic Respiratory Failure: Multifactorial with volume overload and abdominal distention worsening pre-existing asthma, OHS, OSA, and 20 pack/year smoking history with likely undiagnosed COPD. Required O2 2L initially, now on RA with good saturations. Will continue to follow.  - Continue allergy/reactive airway therapy: flonase, loratadine, singulair, formulary LABA/ICS, PRN albuterol - Patient will need CPAP on outpatient basis  Ogilvie's Syndrome: Very chronic problem with neg abd U/S, possible adynamic ileus on KUB. Repeat KUB 9/21 showed chronic distention of the colon, increased in the mid sigmoid region. Repeat KUB 9/22: Decompression of the colon after rectal tube placement with moderate gaseous distention of the sigmoid colon persisting. No evidence of bowel obstruction or free intraperitoneal air. S/p unprepped flex sig for decompression which showed colonic ileus severely dilated sigmoid with no current evidence of mucosal injury; irrigation/lavage of left colon (9/23). Stool output x1 via rectal tube over 24 hours. Notes of some nausea today.  - GI reconsulted, appreciate recs: .  - rectal tube, frequent turning while in bed, Maximize ambulation, maximize bowel regimen   - full liquid diet   - IVF 72ml/hr - Miralax 17g TID (9/21) - Sennakot 8.6mg  BID - Continue  simethicone and PPI -  Hold anticholinergics on discharge - Zofran  PRN for nausea  Hypertension: Blood pressure stable 127-146/44-88 - Continue Norvasc 2.5 mg PO daily  - Hydralazine  IV q2h PRN for SBP > 170. Would avoid this if possible given the possibility of cardiorenal syndrome.   Type II Diabetes Mellitus, Insulin-Dependent: Well, possibly too tightly, controlled - Hb A1c 6.6% on 9/8. Home dose is 27u qAM, 45u qPM + SSI. Recent CBGs climbing significantly with return of appetite. CBG 134-260 over 24 hours, with AM CBG 134; before meals 260, 169 . Required 18 units of SSI  - AM lantus 25u AM; 48u PM  - Novolog 10 units TID with meals - continue resistant SSI  Heart Burn: Chronic issue per patient - Protonix  BID - will monitor symptoms   Rash: With history of cutaneous hypersensitivity to detergents, suspect gown as cause. Improved somewhat since changing gown. No rash today. - Continue to follow.   Arrhythmias: Sick Sinus, Paroxysmal AFib/Flutter:  - On coumadin per pharmacy, amiodarone 200 mg BID started (9/13) - Per HF recommendations Started on amio 200 mg BID 9/13, can decrease to 200 mg daily on discharge  Chronic Pain: - Continue to hold narcotics as can be contributing to constipation - Discontinued home mobic given CHF - Tylenol PRN pain  Social: Patient is being treated for cognitive impairment. Mr. Ouzts daughter, [name], requests daily updates to which the patient has consented verbally. - Continue donepezil - Family decided to accept bed at [facility]   History of recurrent Klebsiella UTI due to Bladder outlet obstruction from BPH: No evidence of infection at this time with good UOP. Patient noted of dysuria that started 9/22 evening. Patient also having decreased urine output possibly due to retention vs discontinued diuretics - UA and bladder scan today  - Consider further urologic investigation as outpatient.  - Continue  myrbetriq, flomax. Hold cardura as redundant therapy. - Hold elavil for concern of worsening retention.  Chronic RLE wound: Followed at wound care clinic as outpatient.  - WOC recommendations appreciated.  FEN/GI: Heart healthy/carb modified diet, saline lock IV Prophylaxis: Therapeutic coumadin  Disposition: Continue diuresis, may be stable for D/C 9/19 when he will be discharged back to [facility] due to daughters' adamant request (SNF is thought to be more ideal dispo).   Subjective:  Patient states his abdomen has not improved since placement of rectal tube; additionally he notes his bottom is sore due to the tube. Patient also notes of nausea that began yesterday evening. SOB is stable from yesterday. Notes of dysuria that started yesterday.  Objective: Temp:  [97.9 F (36.6 C)-98.2 F (36.8 C)] 97.9 F (36.6 C) (09/23 0355) Pulse Rate:  [60-68] 68 (09/23 0355) Resp:  [16-18] 18 (09/23 0355) BP: (127-146)/(51-88) 127/88 mmHg (09/23 0355) SpO2:  [95 %-98 %] 98 % (09/23 0355) Weight:  [263 lb 3.7 oz (119.4 kg)] 263 lb 3.7 oz (119.4 kg) (09/23 0355) Physical Exam: Gen: Pleasant elderly male CV: RRR, no murmur, no LE edema, no JVD Pulm: Nonlabored, decreased sounds at bases bilaterally, CTAB bilaterally  Abd: BS somewhat high pitched, distension stable from yesterday. NT  Extremities: RLE bandaged (chronic wound): clean and dry. No LE edema Neuro: Oriented, speech normal  Laboratory:  Recent Labs Lab 04/10/15 0420 04/11/15 0444  WBC 6.1 6.3  HGB 10.6* 11.1*  HCT 33.3* 34.5*  PLT 166 175    Recent Labs Lab 04/14/15 0300 04/15/15 0312 04/16/15 0445  NA 133* 136 138  K 3.7 3.6 3.5  CL 87* 90* 93*  CO2 35* 35* 34*  BUN 52* 59* 50*  CREATININE 2.88* 2.99* 2.54*  CALCIUM 8.6* 8.6* 8.7*  PROT 7.0  --   --   BILITOT 0.7  --   --   ALKPHOS 103  --   --   ALT 84*  --   --   AST 85*  --   --   GLUCOSE 262* 216* 143*    Palma Holter, MD 04/16/2015,  7:13 AM PGY-1, [facility] Family Medicine FPTS Intern pager: [phone], text pages welcome

## 2015-04-16 NOTE — Progress Notes (Signed)
After abd CT, order to d/c rectal tube and order for SMOG enema done with med BM, pt was bladder scan was 274, urine sent, pt turned in bed

## 2015-04-17 ENCOUNTER — Inpatient Hospital Stay (HOSPITAL_COMMUNITY): Payer: Medicare Other

## 2015-04-17 DIAGNOSIS — E871 Hypo-osmolality and hyponatremia: Secondary | ICD-10-CM

## 2015-04-17 LAB — URINALYSIS, ROUTINE W REFLEX MICROSCOPIC
BILIRUBIN URINE: NEGATIVE
Glucose, UA: NEGATIVE mg/dL
KETONES UR: NEGATIVE mg/dL
NITRITE: NEGATIVE
PH: 6.5 (ref 5.0–8.0)
Protein, ur: 30 mg/dL — AB
Specific Gravity, Urine: 1.016 (ref 1.005–1.030)
UROBILINOGEN UA: 1 mg/dL (ref 0.0–1.0)

## 2015-04-17 LAB — GLUCOSE, CAPILLARY
GLUCOSE-CAPILLARY: 429 mg/dL — AB (ref 65–99)
Glucose-Capillary: 200 mg/dL — ABNORMAL HIGH (ref 65–99)

## 2015-04-17 LAB — BASIC METABOLIC PANEL
Anion gap: 9 (ref 5–15)
BUN: 46 mg/dL — ABNORMAL HIGH (ref 6–20)
CHLORIDE: 93 mmol/L — AB (ref 101–111)
CO2: 32 mmol/L (ref 22–32)
CREATININE: 2.5 mg/dL — AB (ref 0.61–1.24)
Calcium: 8.3 mg/dL — ABNORMAL LOW (ref 8.9–10.3)
GFR calc non Af Amer: 23 mL/min — ABNORMAL LOW (ref >60)
GFR, EST AFRICAN AMERICAN: 27 mL/min — AB (ref >60)
Glucose, Bld: 217 mg/dL — ABNORMAL HIGH (ref 65–99)
POTASSIUM: 3.7 mmol/L (ref 3.5–5.1)
SODIUM: 134 mmol/L — AB (ref 135–145)

## 2015-04-17 LAB — URINE MICROSCOPIC-ADD ON

## 2015-04-17 LAB — PROTIME-INR
INR: 2.82 — ABNORMAL HIGH (ref 0.00–1.49)
Prothrombin Time: 29.2 seconds — ABNORMAL HIGH (ref 11.6–15.2)

## 2015-04-17 LAB — VITAMIN B12: VITAMIN B 12: 390 pg/mL (ref 180–914)

## 2015-04-17 LAB — FOLATE: FOLATE: 35 ng/mL (ref >5.9)

## 2015-04-17 LAB — TSH: TSH: 1.6 u[IU]/mL (ref 0.350–4.500)

## 2015-04-17 MED ORDER — INSULIN ASPART 100 UNIT/ML ~~LOC~~ SOLN
0.0000 [IU] | Freq: Three times a day (TID) | SUBCUTANEOUS | Status: DC
  Administered 2015-04-18: 4 [IU] via SUBCUTANEOUS
  Administered 2015-04-18: 15 [IU] via SUBCUTANEOUS
  Administered 2015-04-19: 7 [IU] via SUBCUTANEOUS
  Administered 2015-04-19: 3 [IU] via SUBCUTANEOUS
  Administered 2015-04-19 – 2015-04-20 (×2): 4 [IU] via SUBCUTANEOUS
  Administered 2015-04-20: 7 [IU] via SUBCUTANEOUS
  Administered 2015-04-20: 4 [IU] via SUBCUTANEOUS
  Administered 2015-04-21: 7 [IU] via SUBCUTANEOUS
  Administered 2015-04-21: 4 [IU] via SUBCUTANEOUS

## 2015-04-17 MED ORDER — CEPHALEXIN 500 MG PO CAPS
500.0000 mg | ORAL_CAPSULE | Freq: Four times a day (QID) | ORAL | Status: DC
  Administered 2015-04-17 – 2015-04-21 (×16): 500 mg via ORAL
  Filled 2015-04-17 (×16): qty 1

## 2015-04-17 MED ORDER — INSULIN ASPART 100 UNIT/ML ~~LOC~~ SOLN
0.0000 [IU] | Freq: Every day | SUBCUTANEOUS | Status: DC
Start: 2015-04-17 — End: 2015-04-21
  Administered 2015-04-17: 5 [IU] via SUBCUTANEOUS

## 2015-04-17 NOTE — Progress Notes (Signed)
ANTICOAGULATION CONSULT NOTE - Follow Up Consult  Pharmacy Consult for Coumadin Indication: atrial fibrillation   Patient Measurements: Height:  (177.8 cm) Weight: 263 lb 6.4 oz (119.477 kg) IBW/kg (Calculated) : 73  Vital Signs: Temp: 98.1 F (36.7 C) (09/24 0451) Temp Source: Oral (09/24 0451) BP: 166/56 mmHg (09/24 0451) Pulse Rate: 60 (09/24 0955)  Labs:  Recent Labs  04/15/15 0312 04/16/15 0445 04/17/15 0410  LABPROT 25.4* 26.7* 29.2*  INR 2.34* 2.50* 2.82*  CREATININE 2.99* 2.54* 2.50*    Estimated Creatinine Clearance: 31 mL/min (by C-G formula based on Cr of 2.5).  Assessment:  81yom continues on coumadin for afib. PTA was on 7.5 mg daily but INR continues to be therapeutic at 2.5 with 5 mg daily for last 7 days. He is also on amiodarone  po bid which is new for him but have yet to see a significant drug interaction. He may need  a couple of times a week alternating with 7.5 at discharge. Going to try  qday for now to see.   Goal of Therapy:  INR 2-3 Monitor platelets by anticoagulation protocol: Yes   Plan:  Coumadin  PO qday Daily INR for now  Isaac Bliss, PharmD, BCPS Clinical Pharmacist Pager [phone] 04/17/2015 12:31 PM

## 2015-04-17 NOTE — Progress Notes (Signed)
Family Medicine Teaching Service Daily Progress Note Intern Pager: [phone]  Patient name: Tyler [name] Medical record number: [id number] Date of birth: [birth year] Age: 81 y.o. Gender: male  Primary Care Provider: [name], MD Consultants: Cardiology Code Status: FULL  Pt Overview and Major Events to Date:  9/12: Admitted for shortness of breath concerning for CHF exacerbation, Wt 291 9/13: Continued on Lasix drip. Wt 280lbs. Amiodarone started for PAF. 9/14: Continued Lasix drip. Wt 281lbs. 9/15: Continued Lasix drip, 1 dose Metolazone. 9/16: Wt down to 273lbs.  9/18: Wt down to 275 lbs. Switched to PO diuretics. Creatinine creeping up. Constipation continues. Repeat enema 9/20: Constipation continues; ordered milk and molases enema  9/21: Reconsulted GI; repeat KUB showed chronic distention of colon, increased in the mid sigmoid region--> unprepped flex sig for decompression  9/22: repeat KUB: Decompression of the colon after rectal tube placement with moderate gaseous distention of the sigmoid colon persisting. No evidence of bowel obstruction or free intraperitoneal air.   Assessment and Plan: Tyler [name] is a 81 y.o. male presenting with recurrent hypoxemic respiratory failure from acute CHF exacerbation. PMH is significant for HFpEF, HTN, HLD, T2DM, OSA, OHS, SSS and PAF s/p dual chamber pacer 2013.  Acute on Chronic Diastolic HF, HFpEF: Echo on most recent admission with EF 55-60%, G2DD. Weight is stable (9/24: 263).  UOP: over last 24 hours.- Heart Failure team following, appreciate recs: Torsemide  held due to increasing Cr - Daily weights, strict I/O, fluid restriction - Holding beta blocker given low suspicion for ACS and acute CHF exacerbation - Continue atorvastatin 40 mg PO daily - per HF team has clinic follow up 9/27 - currently on IVF 41ml/hr  AKI on Stage III CKD: Presumed baseline ~2 Scr. Peak of 2.99 which has been improving since  discontinuing aggressive diuresis. Current Cr of 2.50 - Continue to monitor  - Milk of Magnesia outpatient prn constipation treatment should be discontinued at discharge due to SCr persistently >2mg /dl.  UTI: patient with dysuria, hematuria, change in quality. History of recurrent Klebsiella bacteruria. Patient afebrile with no flank pain. No gross hematuria. - Urine culture - Start Keflex  QID after urine culture is collected (previous susceptibility)  Acute Hypoxemic Respiratory Failure: Multifactorial with volume overload and abdominal distention worsening pre-existing asthma, OHS, OSA, and 20 pack/year smoking history with likely undiagnosed COPD. Required O2 2L initially, now on RA with good saturations. Will continue to follow.  - Continue allergy/reactive airway therapy: flonase, loratadine, singulair, formulary LABA/ICS, PRN albuterol - Patient will need CPAP on outpatient basis  Ogilvie's Syndrome: No bowel obstruction. No free air or free intraperitoneal air. Symptoms improved s/p enema. - GI reconsulted, appreciate recs: .  -  frequent turning while in bed, Maximize ambulation, maximize bowel regimen   - full liquid diet   - IVF 32ml/hr - Miralax 17g TID (9/21) - Sennakot 8.6mg  BID - Continue simethicone and PPI - Hold anticholinergics on discharge - Zofran  PRN for nausea  Hypertension: Blood pressure stable - Continue Norvasc 2.5 mg PO daily  - Hydralazine  IV q2h PRN for SBP > 170. Would avoid this if possible given the possibility of cardiorenal syndrome.   Type II Diabetes Mellitus, Insulin-Dependent: Well, possibly too tightly, controlled - Hb A1c 6.6% on 9/8. Home dose is 27u qAM, 45u qPM + SSI. Recent CBGs climbing significantly with return of appetite. Fasting CBG 200. Required 7 units of SSI in last 24 hours without receiving mealtime coverage - AM lantus  25u AM; 48u PM  - Novolog 10 units TID with meals - continue resistant  SSI  Heart Burn: Chronic issue per patient - Protonix  BID - will monitor symptoms   Rash: With history of cutaneous hypersensitivity to detergents, suspect gown as cause. Improved somewhat since changing gown. No rash today. - Continue to follow.   Arrhythmias: Sick Sinus, Paroxysmal AFib/Flutter:  - On coumadin per pharmacy, amiodarone 200 mg BID started (9/13) - Per HF recommendations Started on amio 200 mg BID 9/13, can decrease to 200 mg daily on discharge  Chronic Pain: - Continue to hold narcotics as can be contributing to constipation - Discontinued home mobic given CHF - Tylenol PRN pain  Social: Patient is being treated for cognitive impairment. Tyler [name] daughter, [name], requests daily updates to which the patient has consented verbally. - Continue donepezil - Family decided to accept bed at [facility]   History of recurrent Klebsiella UTI due to Bladder outlet obstruction from BPH: Bladder scan: - Consider further urologic investigation as outpatient.  - Continue myrbetriq, flomax. Hold cardura as redundant therapy. - Hold elavil for concern of worsening retention.  Chronic RLE wound: Followed at wound care clinic as outpatient.  - WOC recommendations appreciated.  FEN/GI: Heart healthy/carb modified diet, saline lock IV Prophylaxis: Therapeutic coumadin  Disposition: Continue diuresis, may be stable for D/C 9/19 when he will be discharged back to [facility] due to daughters' adamant request (SNF is thought to be more ideal dispo).   Subjective: Patient states that he feels improved from yesterday. Patient feels like his abdomen has improved since his enema. No other symptoms overnight.  Objective: Temp:  [98 F (36.7 C)-98.6 F (37 C)] 98.1 F (36.7 C) (09/24 0451) Pulse Rate:  [59-63] 59 (09/24 0451) Resp:  [18-20] 19 (09/24 0451) BP: (139-171)/(56-61) 166/56 mmHg (09/24 0451) SpO2:  [90 %-100 %] 98 % (09/24  0451) Weight:  [263 lb 6.4 oz (119.477 kg)] 263 lb 6.4 oz (119.477 kg) (09/24 0451)  Physical Exam: Gen: Pleasant elderly male CV: RRR, no LE edema, no JVD Pulm: Nonlabored, decreased sounds at bases bilaterally, CTAB bilaterally throughout  Abd: BS high pitched, abdomen distended but non-tender and soft Extremities: RLE bandaged (chronic wound): clean and dry. No LE edema Neuro: Oriented, speech normal  Laboratory:  Recent Labs Lab 04/11/15 0444  WBC 6.3  HGB 11.1*  HCT 34.5*  PLT 175    Recent Labs Lab 04/14/15 0300 04/15/15 0312 04/16/15 0445 04/17/15 0410  NA 133* 136 138 134*  K 3.7 3.6 3.5 3.7  CL 87* 90* 93* 93*  CO2 35* 35* 34* 32  BUN 52* 59* 50* 46*  CREATININE 2.88* 2.99* 2.54* 2.50*  CALCIUM 8.6* 8.6* 8.7* 8.3*  PROT 7.0  --   --   --   BILITOT 0.7  --   --   --   ALKPHOS 103  --   --   --   ALT 84*  --   --   --   AST 85*  --   --   --   GLUCOSE 262* 216* 143* 217*    Narda Bonds, MD 04/17/2015, 8:20 AM PGY-3, [facility] FPTS Intern pager: [phone], text pages welcome

## 2015-04-17 NOTE — Progress Notes (Signed)
Cross cover [facility]GI Subjective: Patient seems to be resting comfortably in bed. Denies having any abdominal pain, nausea or vomiting.No ambulating at all. When i asked his nurse to ry to make him walk around in the halls she said he was very unsteady and she was afraid he was going to fall. She has him sitting up OOB on a chair. He has not had a BM today but reports having passed some flatus  Objective: Vital signs in last 24 hours: Temp:  [98 F (36.7 C)-98.6 F (37 C)] 98.1 F (36.7 C) 04-18-2023 0451) Pulse Rate:  [59-63] 60 April 18, 2023 0955) Resp:  [16-20] 16 Apr 18, 2023 0955) BP: (139-171)/(56-61) 166/56 mmHg April 18, 2023 0451) SpO2:  [95 %-100 %] 95 % 2023/04/18 0955) Weight:  [119.477 kg (263 lb 6.4 oz)] 119.477 kg (263 lb 6.4 oz) 04-18-23 0451) Last BM Date: 04/14/15  Intake/Output from previous day: 09/23 0701 - 04/18/23 0700 In: 920 [P.O.:920] Out: 900 [Urine:900] Intake/Output this shift: Total I/O In: 480 [P.O.:480] Out: -   General appearance: alert, cooperative, appears stated age, no distress and morbidly obese Resp: clear to auscultation bilaterally Cardio: regular rate and rhythm, S1, S2 normal, no murmur, click, rub or gallop GI: soft, significantly distended but non-tender with high pitched tinkles; no masses,  no organomegaly  Lab Results:  BMET  Recent Labs  04/15/15 0312 04/16/15 0445 18-Apr-2015 0410  NA 136 138 134*  K 3.6 3.5 3.7  CL 90* 93* 93*  CO2 35* 34* 32  GLUCOSE 216* 143* 217*  BUN 59* 50* 46*  CREATININE 2.99* 2.54* 2.50*  CALCIUM 8.6* 8.7* 8.3*   PT/INR  Recent Labs  04/16/15 0445 2015-04-18 0410  LABPROT 26.7* 29.2*  INR 2.50* 2.82*   Studies/Results: Dg Abd 2 Views  Apr 18, 2015   CLINICAL DATA:  Abdominal distension. Mid epigastric abdominal pain. Bowel movement last night.  EXAM: ABDOMEN - 2 VIEW  COMPARISON:  04/16/2015  FINDINGS: No intraperitoneal free air is identified. Diffuse, moderate gaseous distension of the colon is similar to the prior study.  Gas is present in some grossly nondilated loops of small bowel. No acute osseous abnormality.  IMPRESSION: Unchanged diffuse gaseous distention of the colon suggestive of ileus. No definite small bowel dilatation.   Electronically Signed   By: Sebastian Ache M.D.   On: April 18, 2015 09:17   Dg Abd 2 Views  04/16/2015   CLINICAL DATA:  Gaseous abdominal distention.  EXAM: ABDOMEN - 2 VIEW  COMPARISON:  April 15, 2015.  FINDINGS: Air-filled dilatation of the colon is now noted which is increased since prior exam. No small bowel dilatation is noted. No abnormal calcifications are noted.  IMPRESSION: Air-filled dilated colon is noted most consistent with ileus.   Electronically Signed   By: Lupita Raider, M.D.   On: 04/16/2015 13:20    Medications: I have reviewed the patient's current medications.  Assessment/Plan: Chronic constipation complicated by chronic colonic ileus-Ogilvies'syndrome: continue present care with Miralax and Senakot. HE NEEDS TO AMBULATE AND CHANGE HIS POSITION EVERY 2 HOURS. Abdominal films unchanged.  LOS: 12 days   Vida Nicol 2015-04-18, 11:44 AM

## 2015-04-18 LAB — GLUCOSE, CAPILLARY
GLUCOSE-CAPILLARY: 105 mg/dL — AB (ref 65–99)
GLUCOSE-CAPILLARY: 71 mg/dL (ref 65–99)
GLUCOSE-CAPILLARY: 109 mg/dL — AB (ref 65–99)
Glucose-Capillary: 383 mg/dL — ABNORMAL HIGH (ref 65–99)
Glucose-Capillary: 334 mg/dL — ABNORMAL HIGH (ref 65–99)
Glucose-Capillary: 181 mg/dL — ABNORMAL HIGH (ref 65–99)

## 2015-04-18 LAB — BASIC METABOLIC PANEL
ANION GAP: 9 (ref 5–15)
BUN: 43 mg/dL — AB (ref 6–20)
CALCIUM: 8.6 mg/dL — AB (ref 8.9–10.3)
CO2: 32 mmol/L (ref 22–32)
CREATININE: 2.52 mg/dL — AB (ref 0.61–1.24)
Chloride: 95 mmol/L — ABNORMAL LOW (ref 101–111)
GFR calc Af Amer: 26 mL/min — ABNORMAL LOW (ref >60)
GFR, EST NON AFRICAN AMERICAN: 23 mL/min — AB (ref >60)
GLUCOSE: 404 mg/dL — AB (ref 65–99)
Potassium: 4.2 mmol/L (ref 3.5–5.1)
Sodium: 136 mmol/L (ref 135–145)

## 2015-04-18 LAB — PROTIME-INR
INR: 3.14 — AB (ref 0.00–1.49)
PROTHROMBIN TIME: 31.7 s — AB (ref 11.6–15.2)

## 2015-04-18 MED ORDER — WARFARIN SODIUM 2.5 MG PO TABS
2.5000 mg | ORAL_TABLET | Freq: Once | ORAL | Status: AC
  Administered 2015-04-18: 2.5 mg via ORAL

## 2015-04-18 MED ORDER — INSULIN GLARGINE 100 UNIT/ML ~~LOC~~ SOLN
25.0000 [IU] | Freq: Every day | SUBCUTANEOUS | Status: DC
  Administered 2015-04-18 – 2015-04-21 (×3): 25 [IU] via SUBCUTANEOUS
  Filled 2015-04-18 (×5): qty 0.25

## 2015-04-18 MED ORDER — INSULIN GLARGINE 100 UNIT/ML ~~LOC~~ SOLN
51.0000 [IU] | Freq: Every day | SUBCUTANEOUS | Status: DC
  Filled 2015-04-18: qty 0.51

## 2015-04-18 NOTE — Progress Notes (Signed)
Pt CBG 71 tonight, pt states this is very low for him. Pt CBG greater than 200 at night per patient. MD on call notified and ordered lower dose of lantus for tonight. Pt is currently NPO.

## 2015-04-18 NOTE — Progress Notes (Signed)
Cross cover [facility]-GI VSubjective: Since I last evaluated the patient, hisi ileus seems to have worsened. As per my discussion with his nurse, he is NOT able to ambulate or transfer from the bed to the chair by himself. He is severely deconditioned. He is NOT passing any flatus and has not had BM. He denies having any abdominal pain or nausea. I received a call from the family practice resident about discharging him today.  Objective: Vital signs in last 24 hours: Temp:  [97.8 F (36.6 C)-97.9 F (36.6 C)] 97.8 F (36.6 C) 04-19-23 2138) Pulse Rate:  [59] 59 04/19/23 2138) Resp:  [18-20] 20 2023-04-19 2138) BP: (112-133)/(40-51) 112/40 mmHg Apr 19, 2023 2138) SpO2:  [95 %-99 %] 98 % (09/25 0823) Last BM Date: 04/16/15  Intake/Output from previous day: Apr 19, 2023 0701 - 09/25 0700 In: 1200 [P.O.:1200] Out: 200 [Stool:200] Intake/Output this shift:   General appearance: cooperative, appears stated age and no distress Resp: clear to auscultation bilaterally Cardio: regular rate and rhythm, S1, S2 normal, no murmur, click, rub or gallop GI: soft, non-tender, significantly distended with NO bowel sounds normal; no masses,  no organomegaly  BMET  Recent Labs  04/16/15 0445 04/19/15 0410 04/18/15 0321  NA 138 134* 136  K 3.5 3.7 4.2  CL 93* 93* 95*  CO2 34* 32 32  GLUCOSE 143* 217* 404*  BUN 50* 46* 43*  CREATININE 2.54* 2.50* 2.52*  CALCIUM 8.7* 8.3* 8.6*   PT/INR  Recent Labs  19-Apr-2015 0410 04/18/15 0321  LABPROT 29.2* 31.7*  INR 2.82* 3.14*   Studies/Results: Dg Abd 2 Views  2015/04/19   CLINICAL DATA:  Abdominal distension. Mid epigastric abdominal pain. Bowel movement last night.  EXAM: ABDOMEN - 2 VIEW  COMPARISON:  04/16/2015  FINDINGS: No intraperitoneal free air is identified. Diffuse, moderate gaseous distension of the colon is similar to the prior study. Gas is present in some grossly nondilated loops of small bowel. No acute osseous abnormality.  IMPRESSION: Unchanged diffuse  gaseous distention of the colon suggestive of ileus. No definite small bowel dilatation.   Electronically Signed   By: Sebastian Ache M.D.   On: 04-19-15 09:17   Dg Abd 2 Views  04/16/2015   CLINICAL DATA:  Gaseous abdominal distention.  EXAM: ABDOMEN - 2 VIEW  COMPARISON:  April 15, 2015.  FINDINGS: Air-filled dilatation of the colon is now noted which is increased since prior exam. No small bowel dilatation is noted. No abnormal calcifications are noted.  IMPRESSION: Air-filled dilated colon is noted most consistent with ileus.   Electronically Signed   By: Lupita Raider, M.D.   On: 04/16/2015 13:20   Medications: I have reviewed the patient's current medications.  Assessment/Plan: Chronic colonic ileus: Keep patient NPO for now. Change position frequently and get PT ACTIVELY INVOLVED. He will have to be re-admitted for the ileus if his level of physical activity is unchanged.    LOS: 13 days   MANN,JYOTHI 04/18/2015, 11:49 AM

## 2015-04-18 NOTE — Progress Notes (Signed)
Family Medicine Teaching Service Daily Progress Note Intern Pager: [phone]  Patient name: Tyler [name] Medical record number: [id number] Date of birth: 06/04/1934 Age: 81 y.o. Gender: male  Primary Care Tyler [name]: Tyler Inch, MD Consultants: Cardiology, GI Code Status: FULL  Pt Overview and Major Events to Date:  9/12: Admitted for shortness of breath concerning for CHF exacerbation, Wt 291 9/13: Continued on Lasix drip. Wt 280lbs. Amiodarone started for PAF. 9/14: Continued Lasix drip. Wt 281lbs. 9/15: Continued Lasix drip, 1 dose Metolazone. 9/16: Wt down to 273lbs.  9/18: Wt down to 275 lbs. Switched to PO diuretics. Creatinine creeping up. Constipation continues. Repeat enema 9/20: Constipation continues; ordered milk and molases enema  9/21: Reconsulted GI; repeat KUB showed chronic distention of colon, increased in the mid sigmoid region--> unprepped flex sig for decompression  9/22: repeat KUB: Decompression of the colon after rectal tube placement with moderate gaseous distention of the sigmoid colon persisting. No evidence of bowel obstruction or free intraperitoneal air.  9/23: KUB: Air-filled dilated colon is noted most consistent with ileus; removal of rectal tube, enema given 9/24: KUB: Unchanged diffuse gaseous distention of the colon suggestive of ileus. No definite small bowel dilatation. Per GI continue on bowel regimen and ambulate  Assessment and Plan: Tyler [name] is a 81 y.o. male presenting with recurrent hypoxemic respiratory failure from acute CHF exacerbation. PMH is significant for HFpEF, HTN, HLD, T2DM, OSA, OHS, SSS and PAF s/p dual chamber pacer 2013.  Acute on Chronic Diastolic HF, HFpEF: Echo on most recent admission with EF 55-60%, G2DD. UOP: x5 unmeasured over last 24 hours. - Heart Failure team following, appreciate recs: Torsemide  held due to Cr; will touch base today regarding dispo - Daily weights, strict I/O, fluid  restriction - Holding beta blocker given low suspicion for ACS and acute CHF exacerbation - Continue atorvastatin 40 mg PO daily - per HF team has clinic follow up 9/27 - currently on IVF 46ml/hr  AKI on Stage III CKD: Presumed baseline ~2 Scr. Peak of 2.99 which has been improving since discontinuing aggressive diuresis. Current Cr of 2.52 (2.5) - Continue to monitor  - Milk of Magnesia outpatient prn constipation treatment should be discontinued at discharge due to SCr persistently >2mg /dl.  UTI: patient with dysuria, hematuria, change in quality. History of recent recurrent Klebsiella UTI due to Bladder outlet obstruction from BPH. Patient afebrile with no flank pain.  No gross hematuria. UA: large LE, neg nitrites, WBC 7-10, RBC 21-50. - Urine culture pending - Keflex  QID   Acute Hypoxemic Respiratory Failure: Multifactorial with volume overload and abdominal distention worsening pre-existing asthma, OHS, OSA, and 20 pack/year smoking history with likely undiagnosed COPD. Required O2 2L initially, now on RA with good saturations. Will continue to follow.  - Continue allergy/reactive airway therapy: flonase, loratadine, singulair, formulary LABA/ICS, PRN albuterol - Patient will need CPAP on outpatient basis  Chronic Intestinal Constipation: No bowel obstruction. No free air or free intraperitoneal air. Symptoms improved s/p enema. No recorded BM over 24 hours; however had x4 (9/23-9/24). KUB 9/24: unchanged diffuse gaseous distention of the colon suggestive of Ileus.  - GI reconsulted, appreciate recs: .  - frequent turning while in bed, Maximize ambulation, maximize bowel  regimen  - diet: full liquid - Miralax 17g TID (9/21) - Sennakot 8.6mg  BID - Continue simethicone and PPI - Hold anticholinergics on discharge - Zofran  PRN for nausea  Hypertension: Blood pressure stable - Continue Norvasc 2.5 mg PO daily  -  Hydralazine  IV q2h PRN for SBP >  170. Would avoid this if possible given the possibility of cardiorenal syndrome.   Type II Diabetes Mellitus, Insulin-Dependent: Well, possibly too tightly, controlled - Hb A1c 6.6% on 9/8. Home dose is 27u qAM, 45u qPM + SSI. Recent CBGs climbing significantly with return of appetite. (200-429). Fasting CBG: 334. Required 28 units Novolog apart from scheduled mealtime.  - AM lantus 25u AM; 48u->51u PM  - Novolog 10 units TID with meals - continue resistant SSI  Heart Burn: Chronic issue per patient - Protonix  BID - will monitor symptoms   Rash: With history of cutaneous hypersensitivity to detergents, suspect gown as cause. Improved somewhat since changing gown. No rash today. - Continue to follow.   Arrhythmias: Sick Sinus, Paroxysmal AFib/Flutter:  - On coumadin per pharmacy, amiodarone 200 mg BID started (9/13) - Per HF recommendations Started on amio 200 mg BID 9/13, can decrease to 200 mg daily on discharge  Chronic Pain: - Continue to hold narcotics as can be contributing to constipation - Discontinued home mobic given CHF - Tylenol PRN pain  Social: Patient is being treated for cognitive impairment. Tyler [name] daughter, Tyler [name], requests daily updates to which the patient has consented verbally. - Continue donepezil - Family decided to [facility] bed at [facility]   Chronic RLE wound: Followed at wound care clinic as outpatient.  - WOC recommendations appreciated.  FEN/GI: Heart healthy/carb modified diet, saline lock IV Prophylaxis: Therapeutic coumadin  Disposition: Per GI, it is best to keep patient until his ileus improves. Will touch base with HF to determine further cardiac recommendations as well.   Subjective:  Patient states he had a small BM last night. His abdomen feels tight has is stable from yesterday. States it is slightly painful but this is stable. Slight SOB which has been stable for the past days.   Objective: Temp:  [97.8 F  (36.6 C)-97.9 F (36.6 C)] 97.8 F (36.6 C) (09/24 2138) Pulse Rate:  [59-60] 59 (09/24 2138) Resp:  [16-20] 20 (09/24 2138) BP: (112-133)/(40-51) 112/40 mmHg (09/24 2138) SpO2:  [95 %-99 %] 99 % (09/24 2138) Physical Exam: Gen: Pleasant elderly male CV: RRR, no LE edema, no JVD Pulm: Nonlabored, decreased sounds at bases bilaterally, CTAB bilaterally throughout  Abd: BS high pitched, abdomen distended slightly tender, soft Extremities: RLE bandaged (chronic wound): clean and dry. No LE edema Neuro: Oriented, speech normal  Laboratory: No results for input(s): WBC, HGB, HCT, PLT in the last 168 hours.  Recent Labs Lab 04/14/15 0300  04/16/15 0445 04/17/15 0410 04/18/15 0321  NA 133*  < > 138 134* 136  K 3.7  < > 3.5 3.7 4.2  CL 87*  < > 93* 93* 95*  CO2 35*  < > 34* 32 32  BUN 52*  < > 50* 46* 43*  CREATININE 2.88*  < > 2.54* 2.50* 2.52*  CALCIUM 8.6*  < > 8.7* 8.3* 8.6*  PROT 7.0  --   --   --   --   BILITOT 0.7  --   --   --   --   ALKPHOS 103  --   --   --   --   ALT 84*  --   --   --   --   AST 85*  --   --   --   --   GLUCOSE 262*  < > 143* 217* 404*  < > = values in  this interval not displayed.  Palma Holter, MD 04/18/2015, 6:33 AM PGY-1, [facility] FPTS Intern pager: [phone], text pages welcome

## 2015-04-18 NOTE — Progress Notes (Signed)
ANTICOAGULATION CONSULT NOTE - Follow Up Consult  Pharmacy Consult for Coumadin Indication: atrial fibrillation   Patient Measurements: Height:  (177.8 cm) Weight: 263 lb 6.4 oz (119.477 kg) IBW/kg (Calculated) : 73  Vital Signs:    Labs:  Recent Labs  04/16/15 0445 04/17/15 0410 04/18/15 0321  LABPROT 26.7* 29.2* 31.7*  INR 2.50* 2.82* 3.14*  CREATININE 2.54* 2.50* 2.52*    Estimated Creatinine Clearance: 30.8 mL/min (by C-G formula based on Cr of 2.52).  Assessment:  81yom continues on coumadin for afib. PTA was on 7.5 mg daily  INR 3.14  Goal of Therapy:  INR 2-3 Monitor platelets by anticoagulation protocol: Yes   Plan:  Coumadin 2.5mg  x1 Daily INR   Isaac Bliss, PharmD, BCPS Clinical Pharmacist Pager [phone] 04/18/2015 10:48 AM

## 2015-04-18 NOTE — Progress Notes (Signed)
CSW discussed d/c plan with Family Medicine Physician. Continue to monitor for stability- to go to [facility] when medically stable per MD.  CSW services will continue to monitor.  Lorri Frederick. Jaci Lazier, [place] [phone]  (weekend coverage)

## 2015-04-19 ENCOUNTER — Ambulatory Visit: Payer: Medicare Other | Admitting: Podiatry

## 2015-04-19 LAB — PROTIME-INR
INR: 3.39 — ABNORMAL HIGH (ref 0.00–1.49)
PROTHROMBIN TIME: 33.6 s — AB (ref 11.6–15.2)

## 2015-04-19 LAB — CBC
HEMATOCRIT: 36.1 % — AB (ref 39.0–52.0)
Hemoglobin: 11.4 g/dL — ABNORMAL LOW (ref 13.0–17.0)
MCH: 28.4 pg (ref 26.0–34.0)
MCHC: 31.6 g/dL (ref 30.0–36.0)
MCV: 89.8 fL (ref 78.0–100.0)
Platelets: 220 10*3/uL (ref 150–400)
RBC: 4.02 MIL/uL — ABNORMAL LOW (ref 4.22–5.81)
RDW: 12.5 % (ref 11.5–15.5)
WBC: 7.4 10*3/uL (ref 4.0–10.5)

## 2015-04-19 LAB — VITAMIN D 25 HYDROXY (VIT D DEFICIENCY, FRACTURES): VIT D 25 HYDROXY: 39.8 ng/mL (ref 30.0–100.0)

## 2015-04-19 LAB — BASIC METABOLIC PANEL
Anion gap: 8 (ref 5–15)
BUN: 35 mg/dL — AB (ref 6–20)
CALCIUM: 8.7 mg/dL — AB (ref 8.9–10.3)
CHLORIDE: 99 mmol/L — AB (ref 101–111)
CO2: 32 mmol/L (ref 22–32)
CREATININE: 2.33 mg/dL — AB (ref 0.61–1.24)
GFR calc non Af Amer: 25 mL/min — ABNORMAL LOW (ref >60)
GFR, EST AFRICAN AMERICAN: 29 mL/min — AB (ref >60)
GLUCOSE: 160 mg/dL — AB (ref 65–99)
Potassium: 3.9 mmol/L (ref 3.5–5.1)
Sodium: 139 mmol/L (ref 135–145)

## 2015-04-19 LAB — GLUCOSE, CAPILLARY
GLUCOSE-CAPILLARY: 151 mg/dL — AB (ref 65–99)
Glucose-Capillary: 160 mg/dL — ABNORMAL HIGH (ref 65–99)
Glucose-Capillary: 205 mg/dL — ABNORMAL HIGH (ref 65–99)
Glucose-Capillary: 98 mg/dL (ref 65–99)

## 2015-04-19 MED ORDER — INSULIN GLARGINE 100 UNIT/ML ~~LOC~~ SOLN
12.0000 [IU] | Freq: Every morning | SUBCUTANEOUS | Status: DC
  Administered 2015-04-19 – 2015-04-21 (×3): 12 [IU] via SUBCUTANEOUS
  Filled 2015-04-19 (×3): qty 0.12

## 2015-04-19 MED ORDER — TORSEMIDE 20 MG PO TABS: 50.0000 mg | ORAL_TABLET | Freq: Every day | ORAL | Status: DC

## 2015-04-19 MED ORDER — MILK AND MOLASSES ENEMA
1.0000 | Freq: Once | RECTAL | Status: AC
  Administered 2015-04-19: 250 mL via RECTAL
  Filled 2015-04-19: qty 250

## 2015-04-19 MED ORDER — TORSEMIDE 20 MG PO TABS
50.0000 mg | ORAL_TABLET | Freq: Every day | ORAL | Status: DC
  Administered 2015-04-20 – 2015-04-21 (×2): 50 mg via ORAL
  Filled 2015-04-19 (×2): qty 3

## 2015-04-19 NOTE — Progress Notes (Addendum)
ANTICOAGULATION CONSULT NOTE - Follow Up Consult  Pharmacy Consult for Coumadin Indication: atrial fibrillation   Patient Measurements: Height:  (177.8 cm) Weight: 262 lb 4.8 oz (118.978 kg) IBW/kg (Calculated) : 73  Vital Signs: Temp: 97.6 F (36.4 C) (09/26 0614) Temp Source: Oral (09/26 0614) BP: 151/51 mmHg (09/26 0614) Pulse Rate: 60 (09/26 0614)  Labs:  Recent Labs  04/17/15 0410 04/18/15 0321 04/19/15 0257 04/19/15 0719  HGB  --   --   --  11.4*  HCT  --   --   --  36.1*  PLT  --   --   --  220  LABPROT 29.2* 31.7* 33.6*  --   INR 2.82* 3.14* 3.39*  --   CREATININE 2.50* 2.52* 2.33*  --     Estimated Creatinine Clearance: 33.2 mL/min (by C-G formula based on Cr of 2.33).  Assessment: 81 year old male continues on coumadin for afib. PTA was on 7.5 mg daily. INR therapeutic on admission. Patient was stable here on  daily but now up 3.39.  Hg 11.4 pltc 220.  No bleeding reported.   DI: amio - new this admission; Keflex added 9/25. Mild elevation in LFTs noted on 9/21 likely due to colonic issues. These have not been rechecked. Tbili is within normal limits.  Goal of Therapy:  INR 2-3 Monitor platelets by anticoagulation protocol: Yes   Plan:  Hold Coumadin today to allow INR to fall.  Daily INR  Consider rechecking LFTs to ensure improvement on atorvastatin and warfarin.   Link Snuffer, PharmD, BCPS Clinical Pharmacist [phone]  04/19/2015 10:03 AM

## 2015-04-19 NOTE — Progress Notes (Signed)
CSW spoke briefly with daughter [name] for supportive call.  She stated that she is very upset that her father is now NPO and wants to talk to a MD about this. She is able to understand much about his clinical condition but wants to discuss further with MD.  Daily updates by MD have typically been done.  CSW services will continue to monitor.  Lorri Frederick. Jaci Lazier, [place] [phone]

## 2015-04-19 NOTE — Progress Notes (Signed)
Daily Rounding Note  04/19/2015, 11:07 AM  LOS: 14 days   SUBJECTIVE:       Remains nothing by mouth. No bowel movements today. Pre-much staying in bed with some side to side shifting. Remains inactive, isn't always willing to be hoisted into the bedside lounge chair. Some abdominal discomfort but no severe pain, no nausea.  OBJECTIVE:         Vital signs in last 24 hours:    Temp:  [97.6 F (36.4 C)-98 F (36.7 C)] 97.6 F (36.4 C) (09/26 1610) Pulse Rate:  [60] 60 (09/26 0614) Resp:  [18-20] 18 (09/26 0614) BP: (141-151)/(50-51) 151/51 mmHg (09/26 0614) SpO2:  [99 %-100 %] 99 % (09/26 0930) Weight:  [262 lb 4.8 oz (118.978 kg)] 262 lb 4.8 oz (118.978 kg) (09/26 0614) Last BM Date: 04/17/15 Filed Weights   04/16/15 0355 04/17/15 0451 04/19/15 9604  Weight: 263 lb 3.7 oz (119.4 kg) 263 lb 6.4 oz (119.477 kg) 262 lb 4.8 oz (118.978 kg)   General: Morbidly obese, does not look acutely ill or uncomfortable.   Heart: RRR Chest: Clear in front. No labored breathing or cough. Abdomen: Distended and somewhat tense, bowel sounds hypoactive no tingling or tympanitic bowel sounds.  Extremities: Lower extremity edema. Neuro/Psych:  Oriented 3. Moving all 4 limbs.  Intake/Output from previous day: 09/25 0701 - 09/26 0700 In: 120 [P.O.:120] Out: 0   Intake/Output this shift:    Lab Results:  Recent Labs  04/19/15 0719  WBC 7.4  HGB 11.4*  HCT 36.1*  PLT 220   BMET  Recent Labs  04/17/15 0410 04/18/15 0321 04/19/15 0257  NA 134* 136 139  K 3.7 4.2 3.9  CL 93* 95* 99*  CO2 32 32 32  GLUCOSE 217* 404* 160*  BUN 46* 43* 35*  CREATININE 2.50* 2.52* 2.33*  CALCIUM 8.3* 8.6* 8.7*   LFT No results for input(s): PROT, ALBUMIN, AST, ALT, ALKPHOS, BILITOT, BILIDIR, IBILI in the last 72 hours. PT/INR  Recent Labs  04/18/15 0321 04/19/15 0257  LABPROT 31.7* 33.6*  INR 3.14* 3.39*   Hepatitis Panel No  results for input(s): HEPBSAG, HCVAB, HEPAIGM, HEPBIGM in the last 72 hours.  Studies/Results: No results found.   Scheduled Meds: . amiodarone  200 mg Oral BID  . amLODipine  2.5 mg Oral Daily  . atorvastatin  40 mg Oral q1800  . cephALEXin  500 mg Oral 4 times per day  . cycloSPORINE  1 drop Both Eyes BID  . donepezil  10 mg Oral QHS  . gabapentin  100 mg Oral TID  . insulin aspart  0-20 Units Subcutaneous TID WC  . insulin aspart  0-5 Units Subcutaneous QHS  . insulin aspart  10 Units Subcutaneous TID WC  . insulin glargine  12 Units Subcutaneous q morning - 10a  . insulin glargine  25 Units Subcutaneous QHS  . loratadine  10 mg Oral Daily  . mirabegron ER  50 mg Oral Daily  . mometasone-formoterol  2 puff Inhalation BID  . montelukast  10 mg Oral QHS  . mupirocin cream  1 application Topical Daily  . pantoprazole  40 mg Oral BID  . polyethylene glycol  17 g Oral TID  . senna  1 tablet Oral BID  . sertraline  25 mg Oral Daily  . simethicone  80 mg Oral QID  . sodium chloride  3 mL Intravenous Q12H  . tamsulosin  0.4 mg Oral  Daily  . Warfarin - Pharmacist Dosing Inpatient   Does not apply q1800   Continuous Infusions: . sodium chloride 20 mL/hr at 04/14/15 2344   PRN Meds:.sodium chloride, acetaminophen, albuterol, diphenhydrAMINE-zinc acetate, hydrALAZINE, ondansetron, sodium chloride    ASSESMENT:   *  Ogilvie's syndrome, chronic/long-standing colonic ileus in bedridden patient.  04/14/15 flexible sigmoidoscopy with irrigation, lavage, decompression of left colon and placement of rectal tube.  Rectal tube subsequently removed due to patient discomfort. Potassium/magnesium consistently normal.  On TID MiraLAX, BID senna, QID Simethicone. BM recorded yesterday.  2V abdominal films yesterday showed persistent diffuse ileus, clinically not acting obstructed.  *  GNR positive urine culture, specific organism ID pending. Keflex in place.  *  Long-standing ID  DM    PLAN   *  Advanced to clear liquids. Reinforced with nurse need for patient to mobilize at least to the bedside lounge chair.   Jennye Moccasin  04/19/2015, 11:07 AM Pager: [phone]     Attending physician's note   I have taken an interval history, reviewed the chart and examined the patient. I agree with the Advanced Practitioner's note, impression and recommendations. Chronic colonic ileus in a bedridden patient. Continue moving patient regularly, Miralax tid, Senna bid, simethicone qid.   Venita Lick. Russella Dar, MD FACG [phone] Mon-Fri 8a-5p [phone] Mon-Fri 5p-8a, weekends, holidays or per [facility]

## 2015-04-19 NOTE — Care Management Important Message (Signed)
Important Message  Patient Details  Name: Tyler [name] MRN: [id number] Date of Birth: [birth year]   Medicare Important Message Given:  Yes-fourth notification given    Orson Aloe 04/19/2015, 2:17 PM

## 2015-04-19 NOTE — Progress Notes (Signed)
Family Medicine Teaching Service Daily Progress Note Intern Pager: [phone]  Patient name: Tyler [name] Medical record number: [id number] Date of birth: 16-Apr-1934 Age: 81 y.o. Gender: male  Primary Care Provider: [name], MD Pt Overview and Major Events to Date:  9/12: Admitted for shortness of breath concerning for CHF exacerbation, Wt 291 9/13: Continued on Lasix drip. Wt 280lbs. Amiodarone started for PAF. 9/14: Continued Lasix drip. Wt 281lbs. 9/15: Continued Lasix drip, 1 dose Metolazone. 9/16: Wt down to 273lbs.  9/18: Wt down to 275 lbs. Switched to PO diuretics. Creatinine creeping up. Constipation continues. Repeat enema 9/20: Constipation continues; ordered milk and molases enema  9/21: Reconsulted GI; repeat KUB showed chronic distention of colon, increased in the mid sigmoid region--> unprepped flex sig for decompression  9/22: repeat KUB: Decompression of the colon after rectal tube placement with moderate gaseous distention of the sigmoid colon persisting. No evidence of bowel obstruction or free intraperitoneal air.  9/23: KUB: Air-filled dilated colon is noted most consistent with ileus; removal of rectal tube, enema given 9/24: KUB: Unchanged diffuse gaseous distention of the colon suggestive of ileus. No definite small bowel dilatation. Per GI continue on bowel regimen and ambulate 9/25: Per GI, placed on NPO and continue bowel regimen  Assessment and Plan: Tyler [name] is a 81 y.o. male presenting with recurrent hypoxemic respiratory failure from acute CHF exacerbation. PMH is significant for HFpEF, HTN, HLD, T2DM, OSA, OHS, SSS and PAF s/p dual chamber pacer 2013.  Acute on Chronic Diastolic HF, HFpEF: Echo on most recent admission with EF 55-60%, G2DD. UOP: x5 unmeasured over last 24 hours. Weight 262lb (admin 291lb; recent discharge wt 02/2015 179lb). Urine output: x4 unmeasured. Clinically euvolemic. - Heart Failure team following, appreciate  recs: Torsemide  daily restarted 9/26 due to Cr improvement (but currently NPO); stable from cardiac perspective  - Daily weights, strict I/O, fluid restriction - Holding beta blocker given low suspicion for ACS and acute CHF exacerbation - Continue atorvastatin 40 mg PO daily - per HF team has clinic follow up 9/27 initially, will reschedule - currently on IVF 54ml/hr  AKI on Stage III CKD: Presumed baseline ~2 Scr. Peak of 2.99 which has been improving since discontinuing diuresis. Current Cr of 2.33 (2.52) - Continue to monitor  - Milk of Magnesia outpatient prn constipation treatment should be discontinued at discharge due to SCr persistently >2mg /dl.  UTI: patient with dysuria, hematuria, change in quality. History of recent recurrent Klebsiella UTI due to Bladder outlet obstruction from BPH. Patient afebrile with no flank pain. No gross hematuria. UA: large LE, neg nitrites, WBC 7-10, RBC 21-50. - Urine culture pending - Keflex  QID (day 2); will treat total 5 day course   Acute Hypoxemic Respiratory Failure: Multifactorial with volume overload and abdominal distention worsening pre-existing asthma, OHS, OSA, and 20 pack/year smoking history with likely undiagnosed COPD. Required O2 2L initially, on RA with good saturations.  - Continue allergy/reactive airway therapy: flonase, loratadine, singulair, formulary LABA/ICS, PRN albuterol - Patient will need CPAP on outpatient basis  Chronic Intestinal Constipation:  KUB 9/24: unchanged diffuse gaseous distention of the colon suggestive of Ileus. No BM overnight. Abdominal Exam unchanged.  - GI reconsulted, appreciate recs: .  -frequent turning while in bed, Maximize ambulation, maximize bowel regimen - diet: NPO (9/25) - Miralax 17g TID (9/21) - Sennakot 8.6mg  BID - Continue simethicone and PPI - Hold anticholinergics on discharge - Zofran  PRN for nausea  Hypertension: Blood pressure  stable (  140-151/50-58) - Continue Norvasc 2.5 mg PO daily  - Hydralazine  IV q2h PRN for SBP > 170. Would avoid this if possible given the possibility of cardiorenal syndrome.   Type II Diabetes Mellitus, Insulin-Dependent: Well, possibly too tightly, controlled - Hb A1c 6.6% on 9/8. Home dose is 27u qAM, 45u qPM + SSI. Placed NPO 9/25 which resulted in evening CBG 70-> PM lantus decreased to 25u (from 51u). CBG 105-181; fasting CBG 160. 7 units SSI over 24 hrs. - Since NPO, will cut down AM lantus 25u-> 10 AM; PM lantus decreased overnight from 50u->25u PM - Novolog 10 units TID with meals - continue resistant SSI  Heart Burn: Chronic issue per patient - Protonix  BID - will monitor symptoms    Arrhythmias: Sick Sinus, Paroxysmal AFib/Flutter:  - On coumadin per pharmacy, amiodarone 200 mg BID started (9/13) - Per HF recommendations Started on amio 200 mg BID 9/13, can decrease to 200 mg daily on discharge  Chronic Pain: - Continue to hold narcotics as can be contributing to constipation - Discontinued home mobic given CHF - Tylenol PRN pain  Social: Patient is being treated for cognitive impairment. [name] daughter, [name], requests daily updates to which the patient has consented verbally. - Continue donepezil - Family decided to accept bed at [facility]   Chronic RLE wound: Followed at wound care clinic as outpatient.  - WOC recommendations appreciated.  Rash: With history of cutaneous hypersensitivity to detergents, suspect gown as cause. Improved somewhat since changing gown. resolved - Continue to follow.  FEN/GI: Heart healthy/carb modified diet, saline lock IV Prophylaxis: Therapeutic coumadin  Disposition: Per GI, it is best to keep patient until his ileus improves. Will touch base with HF to determine further cardiac recommendations as well.   Subjective:  Patient is not too happy about being NPO. States his abdomen is stable from  yesterday: continues to be distended with bloating. Has not passed gas. No BM overnight. States his breathing is better  Objective: Temp:  [97.6 F (36.4 C)-98 F (36.7 C)] 97.6 F (36.4 C) (09/26 6578) Pulse Rate:  [59-60] 60 (09/26 0614) Resp:  [18-20] 18 (09/26 0614) BP: (140-151)/(50-58) 151/51 mmHg (09/26 0614) SpO2:  [95 %-100 %] 100 % (09/26 0614) Weight:  [262 lb 4.8 oz (118.978 kg)] 262 lb 4.8 oz (118.978 kg) (09/26 4696) Physical Exam: Gen: Pleasant elderly male CV: RRR, no LE edema, no JVD Pulm: Nonlabored, decreased sounds at bases bilaterally with mild crackles at bases bilaterally Abd: BS high pitched, abdomen distended slightly tender, soft Extremities: RLE bandaged (chronic wound): clean and dry. No LE edema Neuro: Oriented, speech normal  Laboratory: No results for input(s): WBC, HGB, HCT, PLT in the last 168 hours.  Recent Labs Lab 04/14/15 0300  04/17/15 0410 04/18/15 0321 04/19/15 0257  NA 133*  < > 134* 136 139  K 3.7  < > 3.7 4.2 3.9  CL 87*  < > 93* 95* 99*  CO2 35*  < > 32 32 32  BUN 52*  < > 46* 43* 35*  CREATININE 2.88*  < > 2.50* 2.52* 2.33*  CALCIUM 8.6*  < > 8.3* 8.6* 8.7*  PROT 7.0  --   --   --   --   BILITOT 0.7  --   --   --   --   ALKPHOS 103  --   --   --   --   ALT 84*  --   --   --   --  AST 85*  --   --   --   --   GLUCOSE 262*  < > 217* 404* 160*  < > = values in this interval not displayed.   Palma Holter, MD 04/19/2015, 6:36 AM PGY-1, [place] FPTS Intern pager: [phone], text pages welcome

## 2015-04-19 NOTE — Progress Notes (Signed)
Advanced Heart Failure Rounding Note  Primary Physician: Dr. Cyndia Bent Primary Cardiologist: Dr [name] HF: Tyler [name]  Subjective:    Says his breathing is fine today, abdomen just uncomfortable.  Otherwise feels ok.  Legs a little sore.  Fluid status has improved but ileus has worsened. GI following.  I/O's innaccurate with incontinence. Weight down > 20 lbs overall from admission. 284 -> 262 Cr back down with holding diuretics (2.99 -> 2.54 -> 2.50 -> 2.52 -> 2.33)   Objective:   Weight Range: 262 lb 4.8 oz (118.978 kg) Body mass index is 37.64 kg/(m^2).   Vital Signs:   Temp:  [97.6 F (36.4 C)-98 F (36.7 C)] 97.6 F (36.4 C) (09/26 9604) Pulse Rate:  [60] 60 (09/26 0614) Resp:  [18-20] 18 (09/26 0614) BP: (141-151)/(50-51) 151/51 mmHg (09/26 0614) SpO2:  [98 %-100 %] 100 % (09/26 5409) Weight:  [262 lb 4.8 oz (118.978 kg)] 262 lb 4.8 oz (118.978 kg) (09/26 0614) Last BM Date: 04/17/15  Weight change: Filed Weights   04/16/15 0355 04/17/15 0451 04/19/15 0614  Weight: 263 lb 3.7 oz (119.4 kg) 263 lb 6.4 oz (119.477 kg) 262 lb 4.8 oz (118.978 kg)    Intake/Output:   Intake/Output Summary (Last 24 hours) at 04/19/15 0745 Last data filed at 04/18/15 2230  Gross per 24 hour  Intake    120 ml  Output      0 ml  Net    120 ml     Physical Exam: General: Obese, Elderly and chronically ill appearing. HEENT: normal Neck: supple. Thick, JVP 8-9 cm. Carotids 2+ bilat; no bruits. No lymphadenopathy or thyromegaly. Cor: PMI nondisplaced. RRR. No M/G/R appreciated. Lungs: Diminished at bases. Abdomen: Obese, NT, +severely distended with no bowel sounds. No HSM. No bruits or masses.  Extremities: no cyanosis, clubbing, rash. Trace LE edema R>L. Covered wound on R LE.  Neuro: alert & orientedx3, cranial nerves grossly intact. moves all 4 extremities w/o difficulty. Affect flat  Telemetry:  discontinued  Labs: CBC  Recent Labs  04/19/15 0719  WBC 7.4  HGB 11.4*  HCT  36.1*  MCV 89.8  PLT 220   Basic Metabolic Panel  Recent Labs  04/18/15 0321 04/19/15 0257  NA 136 139  K 4.2 3.9  CL 95* 99*  CO2 32 32  GLUCOSE 404* 160*  BUN 43* 35*  CALCIUM 8.6* 8.7*   Liver Function Tests No results for input(s): AST, ALT, ALKPHOS, BILITOT, PROT, ALBUMIN in the last 72 hours. No results for input(s): LIPASE, AMYLASE in the last 72 hours. Cardiac Enzymes No results for input(s): CKTOTAL, CKMB, CKMBINDEX, TROPONINI in the last 72 hours.  BNP: BNP (last 3 results)  Recent Labs  03/15/15 1820 04/05/15 1537 04/11/15 1020  BNP 195.8* 317.1* 53.9    ProBNP (last 3 results) No results for input(s): PROBNP in the last 8760 hours.   D-Dimer No results for input(s): DDIMER in the last 72 hours. Hemoglobin A1C No results for input(s): HGBA1C in the last 72 hours. Fasting Lipid Panel No results for input(s): CHOL, HDL, LDLCALC, TRIG, CHOLHDL, LDLDIRECT in the last 72 hours. Thyroid Function Tests  Recent Labs  04/17/15 0410  TSH 1.600    Imaging/Studies:  No results found.  Latest Echo  Latest Cath   Medications:     Scheduled Medications: . amiodarone  200 mg Oral BID  . amLODipine  2.5 mg Oral Daily  . atorvastatin  40 mg Oral q1800  . cephALEXin  500 mg Oral  4 times per [name]  . cycloSPORINE  1 drop Both Eyes BID  . donepezil  10 mg Oral QHS  . gabapentin  100 mg Oral TID  . insulin aspart  0-20 Units Subcutaneous TID WC  . insulin aspart  0-5 Units Subcutaneous QHS  . insulin aspart  10 Units Subcutaneous TID WC  . insulin glargine  25 Units Subcutaneous q morning - 10a  . insulin glargine  25 Units Subcutaneous QHS  . loratadine  10 mg Oral Daily  . mirabegron ER  50 mg Oral Daily  . mometasone-formoterol  2 puff Inhalation BID  . montelukast  10 mg Oral QHS  . mupirocin cream  1 application Topical Daily  . pantoprazole  40 mg Oral BID  . polyethylene glycol  17 g Oral TID  . senna  1 tablet Oral BID  . sertraline  25  mg Oral Daily  . simethicone  80 mg Oral QID  . sodium chloride  3 mL Intravenous Q12H  . tamsulosin  0.4 mg Oral Daily  . Warfarin - Pharmacist Dosing Inpatient   Does not apply q1800    Infusions: . sodium chloride 20 mL/hr at 04/14/15 2344    PRN Medications: sodium chloride, acetaminophen, albuterol, diphenhydrAMINE-zinc acetate, hydrALAZINE, ondansetron, sodium chloride   Assessment/Plan   Tyler [name] is a 81 y.o. male with history of Chronic diastolic CHF, Echo 03/16/15 Echo EF 55-60%, Grade 2 DD, mildly dilated RV, hx of sick sinus syndrome s/p STJ dual chamber PCM 2013, paroxysmal a fib stable on coumadin, OHS/OSA non-compliant with CPAP, HTN, and DM who presented with worsening SOB, orthopnea, and edema.  1. Acute on chronic diastolic HF, EF 55-60%, grade 2 DD, mildly dilated RV. s/p STJ dual chamber PPM 2013 - Volume status improved from admit.  ~ 20 lbs off. - Cr back down, will start back on previous dose of 50 mg torsemide daily for now. - Continue daily weights, strict I/O, and fluid restriction.  - Has HF clinic follow up scheduled for 9/27, will reschedule. 2. OSA/OHS - Likely major contributors to his symptoms as well as his RV dysfunction.  - Needs to follow up with pulmonary sleep medicine at discharge. 3. Paroxysmal a fib - CHA2DS2VASC score at least 5 (7.2% risk of stroke per year) - On chronic coumadin - hx of cardioversion - Regular rhythm on exam. - Interrogation showed 49% afib burden since 11/02/14 - Started on amio 200 mg BID 9/13, can decrease to 200 mg daily on discharge. - LFTs mildly elevated 9/21. 4. AKI on CKD Stage III-IV - Will monitor closely - Cr back down with holding diuretics (2.99 -> 2.54 -> 2.50 -> 2.52 -> 2.33) 5. Hyperkalemia - Improved. Continue to monitor.  Corrected to hypo.  Supp as needed. 6. HTN 7. Hx of sick sinus syndrome/symptomatic bradycardia - s/p STJ dual chamber PPM 2013 as above. 8. 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 - Illeus has worsened this admission and is currently the limiting factor of his discharge.  Dispo: Has room at [facility] once medically stable.  Length of Stay: [address]  Graciella Freer PA-C 04/19/2015, 7:45 AM  Advanced Heart Failure Team Pager [phone] (M-F; 7a - 4p)  Please contact [facility] for night-coverage after hours (4p -7a ) and weekends on [email or web address]   Patient seen with PA, agree with the above note.  He continues to have issues with ileus.  Stable from cardiac perspective,  weight is staying down.  Creatinine back to 2.3.  He is currently NPO.  When he is not NPO, would start back torsemide 50 mg daily.    We will follow from a distance at this point.   Marca Ancona 04/19/2015 8:43 AM

## 2015-04-20 ENCOUNTER — Encounter (HOSPITAL_COMMUNITY): Payer: Medicare Other

## 2015-04-20 LAB — GLUCOSE, CAPILLARY
GLUCOSE-CAPILLARY: 168 mg/dL — AB (ref 65–99)
GLUCOSE-CAPILLARY: 230 mg/dL — AB (ref 65–99)
GLUCOSE-CAPILLARY: 246 mg/dL — AB (ref 65–99)
GLUCOSE-CAPILLARY: 110 mg/dL — AB (ref 65–99)
Glucose-Capillary: 184 mg/dL — ABNORMAL HIGH (ref 65–99)

## 2015-04-20 LAB — HEPATIC FUNCTION PANEL
ALBUMIN: 3 g/dL — AB (ref 3.5–5.0)
ALT: 62 U/L (ref 17–63)
AST: 39 U/L (ref 15–41)
Alkaline Phosphatase: 107 U/L (ref 38–126)
Bilirubin, Direct: <0.1 mg/dL — ABNORMAL LOW (ref 0.1–0.5)
Total Bilirubin: 0.4 mg/dL (ref 0.3–1.2)
Total Protein: 7.6 g/dL (ref 6.5–8.1)

## 2015-04-20 LAB — BASIC METABOLIC PANEL
ANION GAP: 11 (ref 5–15)
BUN: 35 mg/dL — ABNORMAL HIGH (ref 6–20)
CHLORIDE: 96 mmol/L — AB (ref 101–111)
CO2: 31 mmol/L (ref 22–32)
Calcium: 8.7 mg/dL — ABNORMAL LOW (ref 8.9–10.3)
Creatinine, Ser: 2.35 mg/dL — ABNORMAL HIGH (ref 0.61–1.24)
GFR calc non Af Amer: 25 mL/min — ABNORMAL LOW (ref >60)
GFR, EST AFRICAN AMERICAN: 29 mL/min — AB (ref >60)
Glucose, Bld: 136 mg/dL — ABNORMAL HIGH (ref 65–99)
POTASSIUM: 3.8 mmol/L (ref 3.5–5.1)
Sodium: 138 mmol/L (ref 135–145)

## 2015-04-20 LAB — CBC
HEMATOCRIT: 36.9 % — AB (ref 39.0–52.0)
HEMOGLOBIN: 11.9 g/dL — AB (ref 13.0–17.0)
MCH: 28.7 pg (ref 26.0–34.0)
MCHC: 32.2 g/dL (ref 30.0–36.0)
MCV: 89.1 fL (ref 78.0–100.0)
Platelets: 238 10*3/uL (ref 150–400)
RBC: 4.14 MIL/uL — AB (ref 4.22–5.81)
RDW: 12.6 % (ref 11.5–15.5)
WBC: 8.6 10*3/uL (ref 4.0–10.5)

## 2015-04-20 LAB — PROTIME-INR
INR: 3.65 — AB (ref 0.00–1.49)
Prothrombin Time: 35.5 seconds — ABNORMAL HIGH (ref 11.6–15.2)

## 2015-04-20 MED ORDER — GLYCERIN (LAXATIVE) 2.1 G RE SUPP
1.0000 | Freq: Once | RECTAL | Status: AC
  Administered 2015-04-20: 1 via RECTAL
  Filled 2015-04-20: qty 1

## 2015-04-20 MED ORDER — AMIODARONE HCL 200 MG PO TABS
200.0000 mg | ORAL_TABLET | Freq: Every day | ORAL | Status: DC
  Administered 2015-04-21: 200 mg via ORAL
  Filled 2015-04-20: qty 1

## 2015-04-20 MED ORDER — METOPROLOL TARTRATE 12.5 MG HALF TABLET
12.5000 mg | ORAL_TABLET | Freq: Two times a day (BID) | ORAL | Status: DC
  Administered 2015-04-20 – 2015-04-21 (×3): 12.5 mg via ORAL
  Filled 2015-04-20 (×3): qty 1

## 2015-04-20 MED ORDER — SENNA 8.6 MG PO TABS
1.0000 | ORAL_TABLET | Freq: Two times a day (BID) | ORAL | Status: DC
  Administered 2015-04-20 – 2015-04-21 (×3): 8.6 mg via ORAL
  Filled 2015-04-20 (×3): qty 1

## 2015-04-20 NOTE — Progress Notes (Signed)
Physical Therapy Treatment Patient Details Name: Tyler [name] MRN: [id number] DOB: 21-Feb-1934 Today's Date: 04/20/2015    History of Present Illness 81 y.o. male admitted with dyspnea and ileus. PMH is significant for CHF, SSS s/p pacemaker, a flutter/fib on warfarin, CKD, HTN, T2DM, HLD, BPH, Asthma, OSA, anemia    PT Comments    Requested to return to room per nursing who were unable to get pt to stand from chair and use of Stedy was not successful. Pt able to stand with max assist +2 but not safe to transfer and utilized maxi move for return to bed. Pt with nursing completing positioning and pt care end of session.   Follow Up Recommendations  SNF;Supervision for mobility/OOB     Equipment Recommendations       Recommendations for Other Services       Precautions / Restrictions Precautions Precautions: Fall Precaution Comments: pt with h/o falls and progressive decline in his mobility.  Restrictions Weight Bearing Restrictions: No Other Position/Activity Restrictions: However pt barely able to WB on RLE in standing or attempt at taking steps    Mobility  Bed Mobility Overal bed mobility: Needs Assistance;+2 for physical assistance Bed Mobility: Sit to Supine;Rolling Rolling: Mod assist   Supine to sit: Min assist;HOB elevated     General bed mobility comments: sit to supine max assist to control trunk and bring legs onto surface. mod assist to roll bil   Transfers Overall transfer level: Needs assistance Equipment used: Rolling walker (2 wheeled) Transfers: Sit to/from Stand Sit to Stand: Max assist;+2 physical assistance Stand pivot transfers: +2 physical assistance;Min assist       General transfer comment: pt in recliner on arrival with assist of 3 nurses and Stedy pt unable to stand. Removed stedy, blocked pt knee and given cues and assist for anterior translation and right knee blocked pt able to stand but could not achieve upright or shift weight and  returned to sitting. Maximove utilized to lift pt from chair to bed  Ambulation/Gait                 Stairs            Wheelchair Mobility    Modified Rankin (Stroke Patients Only)       Balance Overall balance assessment: Needs assistance Sitting-balance support: No upper extremity supported Sitting balance-Leahy Scale: Fair     Standing balance support: Bilateral upper extremity supported Standing balance-Leahy Scale: Poor                      Cognition Arousal/Alertness: Lethargic Behavior During Therapy: Flat affect Overall Cognitive Status: Within Functional Limits for tasks assessed               Problem Solving: Slow processing;Decreased initiation;Requires verbal cues General Comments: pt with delayed response to questions, not following commands or assisting with mobility    Exercises General Exercises - Lower Extremity Long Arc Quad: AROM;Both;Seated;15 reps;Strengthening Hip Flexion/Marching: AROM;Strengthening;Seated;Both;15 reps Other Exercises Other Exercises: seated resistive knee extension in sitting RLE x 20 AROM    General Comments        Pertinent Vitals/Pain Pain Assessment: 0-10 Pain Score: 9  Pain Location: right knee with any movement and WB'ing (also hurts at rest, but I did not ask him to rate it) Pain Descriptors / Indicators: Aching;Sore Pain Intervention(s): Repositioned;Limited activity within patient's tolerance;Ice applied    Home Living  Prior Function            PT Goals (current goals can now be found in the care plan section) Acute Rehab PT Goals PT Goal Formulation: With patient Time For Goal Achievement: 05/04/15 Potential to Achieve Goals: Fair Progress towards PT goals: Not progressing toward goals - comment    Frequency       PT Plan Current plan remains appropriate    Co-evaluation             End of Session Equipment Utilized During Treatment:  Gait belt Activity Tolerance: Patient limited by fatigue Patient left: in bed;with call bell/phone within reach;with nursing/sitter in room     Time: 0454-0981 PT Time Calculation (min) (ACUTE ONLY): 13 min  Charges:  $Therapeutic Exercise: 8-22 mins $Therapeutic Activity: 8-22 mins                    G Codes:      Delorse Lek 05/11/2015, 2:37 PM Delaney Meigs, PT [phone]

## 2015-04-20 NOTE — Progress Notes (Addendum)
Received page from OT regarding patient having right knee pain with activity. Went to evaluate patient. States he fell from his wheelchair and hit his knee at clinic the day of his previous admission in August. Patient states that he did not get imaging during previous admission. On exam of right knee, there is tenderness to palpation of lateral joint line, no effusion of knee noted. Mild soreness with palpation of patella. No increased warmth. Pain on lateral right knee with valgus stress; some discomfort with varus stress. Difficult to assess ROM however patient had pain with mild knee flexion. - consider x-ray of right knee to rule out fracture - will discuss with treatment team before ordering imaging  Kandis Mannan, MD Family Medicine, PGY 1 04/20/2015

## 2015-04-20 NOTE — Progress Notes (Signed)
ANTICOAGULATION CONSULT NOTE - Follow Up Consult  Pharmacy Consult for Coumadin Indication: atrial fibrillation   Patient Measurements: Height:  (177.8 cm) Weight: 266 lb 8 oz (120.884 kg) IBW/kg (Calculated) : 73  Vital Signs: Temp: 98.5 F (36.9 C) (09/27 0536) Temp Source: Oral (09/27 0536) BP: 133/53 mmHg (09/27 0536) Pulse Rate: 66 (09/27 0536)  Labs:  Recent Labs  04/18/15 0321 04/19/15 0257 04/19/15 0719 04/20/15 0348  HGB  --   --  11.4* 11.9*  HCT  --   --  36.1* 36.9*  PLT  --   --  220 238  LABPROT 31.7* 33.6*  --  35.5*  INR 3.14* 3.39*  --  3.65*  CREATININE 2.52* 2.33*  --  2.35*    Estimated Creatinine Clearance: 33.2 mL/min (by C-G formula based on Cr of 2.35).  Assessment: 81 year old male continues on coumadin for afib. PTA was on 7.5 mg daily. INR therapeutic on admission. Patient was stable here on  daily but now up 3.65.  Hg 11.9 pltc 238.   -Keflex added 9/25. -AST/ALT= 85/84 on 9/21   Goal of Therapy:  INR 2-3 Monitor platelets by anticoagulation protocol: Yes   Plan:  Hold Coumadin today  Daily INR  Will re-check LFTs in am  Harland German, Pharm D 04/20/2015 8:55 AM

## 2015-04-20 NOTE — Progress Notes (Signed)
Occupational Therapy Treatment Patient Details Name: Tyler [name] MRN: [id number] DOB: 11-16-1933 Today's Date: 04/20/2015    History of present illness 81 y.o. male admitted with dyspnea and ileus. PMH is significant for CHF, SSS s/p pacemaker, a flutter/fib on warfarin, CKD, HTN, T2DM, HLD, BPH, Asthma, OSA, anemia   OT comments  This 81 yo male admitted with above presents to acute OT today not making progress towards goals due to right knee pain (have made my concerns known via a text page to internal med intern). He will continue to benefit from acute OT, but may make slow to no progress depending on right knee pain.  Follow Up Recommendations  SNF    Equipment Recommendations  None recommended by OT       Precautions / Restrictions Precautions Precautions: Fall Precaution Comments: pt with h/o falls and progressive decline in his mobility.  Restrictions Weight Bearing Restrictions: No Other Position/Activity Restrictions: However pt barely able to WB on RLE in standing or attempt at taking steps       Mobility Bed Mobility     General bed mobility comments: Pt up in recliner upon my arrival asking to remain there when we finished  Transfers Overall transfer level: Needs assistance Equipment used: Rolling walker (2 wheeled) Transfers: Sit to/from Stand Sit to Stand: Mod assist;+2 safety/equipment       General transfer comment: total A +2 to scoot forward and back in recliner with use of pad    Balance Overall balance assessment: Needs assistance Sitting-balance support: No upper extremity supported Sitting balance-Leahy Scale: Fair     Standing balance support: Bilateral upper extremity supported Standing balance-Leahy Scale: Poor                     ADL Overall ADL's : Needs assistance/impaired                         Toilet Transfer: +2 for physical assistance;Moderate assistance (sit<>stand from recliner with taking 2 small steps  back wards to sit)   Toileting- Clothing Manipulation and Hygiene: Total assistance (with +2 mod A sit<>stand)                          Cognition   Behavior During Therapy: Flat affect Overall Cognitive Status: Within Functional Limits for tasks assessed                Problem Solving: Slow processing;Decreased initiation;Requires verbal cues                   Pertinent Vitals/ Pain       Pain Assessment: 0-10 Pain Score: 9  Pain Location: right knee with any movement and WB'ing (also hurts at rest, but I did not ask him to rate it) Pain Descriptors / Indicators: Aching;Sore Pain Intervention(s): Repositioned;Limited activity within patient's tolerance;Ice applied         Frequency Min 2X/week     Progress Toward Goals  OT Goals(current goals can now be found in the care plan section)  Progress towards OT goals: Not progressing toward goals - comment (due to pain in his right knee--I texted paged family medical intern with my concerns and asked about an xray/MRI of pt's right knee)     Plan Discharge plan remains appropriate       End of Session Equipment Utilized During Treatment: Oxygen   Activity Tolerance Patient limited by pain  Patient Left in chair;with call bell/phone within reach;with chair alarm set   Nurse Communication  (pt requesting pain meds other than tylenol)        Time: 4098-1191 OT Time Calculation (min): 34 min  Charges: OT General Charges $OT Visit: 1 Procedure OT Treatments $Therapeutic Activity: 23-37 mins  Evette Georges [phone] 04/20/2015, 1:05 PM

## 2015-04-20 NOTE — Progress Notes (Signed)
Family Medicine Teaching Service Daily Progress Note Intern Pager: [phone]  Patient name: Tyler [name] Medical record number: [id number] Date of birth: [birth year] Age: 81 y.o. Gender: male  Primary Care Provider: [name], MD Consultants: GI, HF Code Status: FULL  Pt Overview and Major Events to Date:  9/12: Admitted for shortness of breath concerning for CHF exacerbation, Wt 291 9/13: Continued on Lasix drip. Wt 280lbs. Amiodarone started for PAF. 9/14: Continued Lasix drip. Wt 281lbs. 9/15: Continued Lasix drip, 1 dose Metolazone. 9/16: Wt down to 273lbs.  9/18: Wt down to 275 lbs. Switched to PO diuretics. Creatinine creeping up. Constipation continues. Repeat enema 9/20: Constipation continues; ordered milk and molases enema  9/21: Reconsulted GI; repeat KUB showed chronic distention of colon, increased in the mid sigmoid region--> unprepped flex sig for decompression  9/22: repeat KUB: Decompression of the colon after rectal tube placement with moderate gaseous distention of the sigmoid colon persisting. No evidence of bowel obstruction or free intraperitoneal air.  9/23: KUB: Air-filled dilated colon is noted most consistent with ileus; removal of rectal tube, enema given 9/24: KUB: Unchanged diffuse gaseous distention of the colon suggestive of ileus. No definite small bowel dilatation. Per GI continue on bowel regimen and ambulate 9/25: Per GI, placed on NPO and continue bowel regimen 9/26: Transitioned to clears; enema with good response; HF restarted Torsemide  Assessment and Plan: Tyler [name] is a 81 y.o. male presenting with recurrent hypoxemic respiratory failure from acute CHF exacerbation. PMH is significant for HFpEF, HTN, HLD, T2DM, OSA, OHS, SSS and PAF s/p dual chamber pacer 2013.  Acute on Chronic Diastolic HF, HFpEF: Echo on most recent admission with EF 55-60%, G2DD. UOP: x5 unmeasured over last 24 hours. Weight 266lb< 262lb 9/26 (admin  291lb; recent discharge wt 02/2015 179lb). Urine output: + 2 unmeasured. Clinically euvolemic. - Heart Failure team following, appreciate recs: Torsemide  daily restarted 9/27 due to Cr improvement; stable from cardiac perspective  - Daily weights, strict I/O, fluid restriction - will restart BB at low dose: Metoprolol 12.5mg  BID - Continue atorvastatin 40 mg PO daily - per HF team has clinic follow up 9/27 initially, will reschedule - currently on IVF 65ml/hr  AKI on Stage III CKD: Presumed baseline ~2 Scr. Peak of 2.99 which has been improving since discontinuing diuresis. Current Cr of 2.35 (2.33) - Continue to monitor  - Milk of Magnesia outpatient prn constipation treatment should be discontinued at discharge due to SCr persistently >2mg /dl.  UTI: patient with dysuria, hematuria, change in quality. History of recent recurrent Klebsiella UTI due to Bladder outlet obstruction from BPH. Patient afebrile with no flank pain. No gross hematuria. UA: large LE, neg nitrites, WBC 7-10, RBC 21-50. - Urine culture: >100,000 colonies gram neg rods  - Keflex  QID (day 3); will treat total 5 day course   Acute Hypoxemic Respiratory Failure: Multifactorial with volume overload and abdominal distention worsening pre-existing asthma, OHS, OSA, and 20 pack/year smoking history with likely undiagnosed COPD. Required O2 2L initially, now on RA with good saturations.  - Continue allergy/reactive airway therapy: flonase, loratadine, singulair, formulary LABA/ICS, PRN albuterol - Patient will need CPAP on outpatient basis  Chronic Intestinal Constipation: KUB 9/24: unchanged diffuse gaseous distention of the colon suggestive of Ileus. BM with enema 9/26 but pt report not relief with bloating, tolerating clear liquid diet. Abdominal Exam unchanged.  - GI reconsulted, appreciate recs: .  -frequent turning while in bed, Maximize ambulation, continue miralax; consider switching  sennakot to suppository  - advance diet as tolerated: advanced to full liquids this morning  - Miralax 17g TID (9/21) - Senokot 8.6 BID - will start soap suds enema daily - Continue simethicone and PPI - Hold anticholinergics on discharge - Zofran  PRN for nausea  Hypertension: Blood pressure stable (133-151/38-53) - will discontinue Norvasc 2.5mg  (patient not on this medication at home, BP have been stable, restarted Metoprolol today) - Hydralazine  IV q2h PRN for SBP > 170. Would avoid this if possible given the possibility of cardiorenal syndrome.   Type II Diabetes Mellitus, Insulin-Dependent: Well, possibly too tightly, controlled - Hb A1c 6.6% on 9/8. Home dose is 27u qAM, 45u qPM + SSI. CBG 98-205; fasting CBG 168. 15 units SSI over 24 hrs. - due to clear liquid diet regimen changed 9/26:  AM lantus 10u; PM lantus 25u  - Novolog 10 units TID with meals - continue resistant SSI - diet advanced to full liquid  Arrhythmias: Sick Sinus, Paroxysmal AFib/Flutter: with dual chamber pacer in situ - On coumadin per pharmacy (INR 3.39 9/26-> 3.65 9/27; Pharmacy held coumadin 9/26), - amiodarone 200 mg BID started (9/13)-> transitioned to Amiodarone  daily per HF   Chronic Pain: - Continue to hold narcotics as can be contributing to constipation - Discontinued home mobic given CHF - Tylenol PRN pain  Heart Burn: Chronic issue per patient - Protonix  BID - will monitor symptoms   Social: Patient is being treated for cognitive impairment. Mr. Vantol daughter, [name], requests daily updates to which the patient has consented verbally. - Continue donepezil - Family decided to accept bed at [facility]   Chronic RLE wound: Followed at wound care clinic as outpatient.  - WOC recommendations appreciated.  Rash: With history of cutaneous hypersensitivity to detergents, suspect gown as cause. resolved - Continue to follow.  FEN/GI: full liquid;  advance as tolerated Prophylaxis: Therapeutic coumadin  Disposition: Social Work has SNF placement prepared. Per GI if patient is tolerating diet and having BM, okay to discharge. Advancing patient's diet today. Will follow up with HF team regarding dispo.  Subjective:  Patient states he is doing fine today. Abdomen is the same from yesterday; He did have a BM with enema 9/26 but this did not improve his bloating.   Objective: Temp:  [98.1 F (36.7 C)-98.5 F (36.9 C)] 98.5 F (36.9 C) (09/27 0536) Pulse Rate:  [58-66] 66 (09/27 0536) Resp:  [18] 18 (09/27 0536) BP: (133-145)/(38-53) 133/53 mmHg (09/27 0536) SpO2:  [99 %-100 %] 99 % (09/27 0536) Weight:  [266 lb 8 oz (120.884 kg)] 266 lb 8 oz (120.884 kg) (09/27 0536) Physical Exam: Gen: Pleasant elderly male CV: RRR, no LE edema, no JVD Pulm: Nonlabored, decreased sounds at bases bilaterally, no crackles/wheezing Abd: BS high pitched, abdomen distended slightly tender, soft Extremities: RLE bandaged (chronic wound): clean and dry. No LE edema Neuro: Oriented, speech normal  Laboratory:  Recent Labs Lab 04/19/15 0719 04/20/15 0348  WBC 7.4 8.6  HGB 11.4* 11.9*  HCT 36.1* 36.9*  PLT 220 238    Recent Labs Lab 04/14/15 0300  04/18/15 0321 04/19/15 0257 04/20/15 0348  NA 133*  < > 136 139 138  K 3.7  < > 4.2 3.9 3.8  CL 87*  < > 95* 99* 96*  CO2 35*  < > 32 32 31  BUN 52*  < > 43* 35* 35*  CREATININE 2.88*  < > 2.52* 2.33* 2.35*  CALCIUM 8.6*  < >  8.6* 8.7* 8.7*  PROT 7.0  --   --   --   --   BILITOT 0.7  --   --   --   --   ALKPHOS 103  --   --   --   --   ALT 84*  --   --   --   --   AST 85*  --   --   --   --   GLUCOSE 262*  < > 404* 160* 136*  < > = values in this interval not displayed.   Palma Holter, MD 04/20/2015, 6:37 AM PGY-1, [place] [facility] FPTS Intern pager: [phone], text pages welcome

## 2015-04-20 NOTE — Progress Notes (Signed)
Physical Therapy Treatment Patient Details Name: Tyler [name] MRN: [id number] DOB: Oct 05, 1933 Today's Date: 04/20/2015    History of Present Illness 81 y.o. male admitted with dyspnea and ileus. PMH is significant for CHF, SSS s/p pacemaker, a flutter/fib on warfarin, CKD, HTN, T2DM, HLD, BPH, Asthma, OSA, anemia    PT Comments    Pt with increased mobility with bed transfers but continues to require +2 assist for transfers in standing and safety with inability to perform significant gait trials. Pt educated for HEP, need to increase mobility and gait. Pt states he does not ask to get up because that's not his job. Reiterated importance of mobilizing and requesting assist for mobility. Will continue to follow. Pt performed HEP prior to standing trials today per pt request.   Follow Up Recommendations  SNF;Supervision for mobility/OOB     Equipment Recommendations       Recommendations for Other Services       Precautions / Restrictions Precautions Precautions: Fall Restrictions Weight Bearing Restrictions: No    Mobility  Bed Mobility Overal bed mobility: Needs Assistance Bed Mobility: Supine to Sit     Supine to sit: Min assist;HOB elevated     General bed mobility comments: min cues with pt able to pivot legs to EOB, use of rail to rotate trunk with assist to elevate trunk from surface  Transfers Overall transfer level: Needs assistance   Transfers: Sit to/from Stand Sit to Stand: Mod assist;+2 safety/equipment Stand pivot transfers: +2 physical assistance;Min assist       General transfer comment: sit to stand x2 from bed and recliner assist for anterior translation, hand placement and safety. Pt took 4 steps away from the bed, reported right knee pain despite knee being blocked and began turning to chair. Pt attemtping to sit before backed to surface with min +2 assist and max cues for pt to back fully to surface with assist and cues to control  descent.  Ambulation/Gait                 Stairs            Wheelchair Mobility    Modified Rankin (Stroke Patients Only)       Balance Overall balance assessment: Needs assistance   Sitting balance-Leahy Scale: Fair       Standing balance-Leahy Scale: Poor                      Cognition Arousal/Alertness: Awake/alert Behavior During Therapy: Flat affect Overall Cognitive Status: Impaired/Different from baseline               Problem Solving: Slow processing;Decreased initiation;Requires verbal cues      Exercises General Exercises - Lower Extremity Long Arc Quad: AROM;Both;Seated;15 reps;Strengthening Hip Flexion/Marching: AROM;Strengthening;Seated;Both;15 reps Other Exercises Other Exercises: seated resistive knee extension in sitting RLE x 20 AROM    General Comments        Pertinent Vitals/Pain Pain Score: 5  Pain Location: right knee Pain Descriptors / Indicators: Aching Pain Intervention(s): Repositioned;Limited activity within patient's tolerance;Monitored during session    Home Living                      Prior Function            PT Goals (current goals can now be found in the care plan section) Acute Rehab PT Goals PT Goal Formulation: With patient Time For Goal Achievement: 05/04/15 Potential to Achieve Goals: Fair  Progress towards PT goals: Goals downgraded-see care plan    Frequency       PT Plan Current plan remains appropriate    Co-evaluation             End of Session Equipment Utilized During Treatment: Gait belt Activity Tolerance: Patient tolerated treatment well Patient left: in chair;with call bell/phone within reach;with chair alarm set;with nursing/sitter in room     Time: 0921-0945 PT Time Calculation (min) (ACUTE ONLY): 24 min  Charges:  $Therapeutic Exercise: 8-22 mins $Therapeutic Activity: 8-22 mins                    G Codes:      Delorse Lek May 18, 2015,  10:47 AM Delaney Meigs, PT [phone]

## 2015-04-20 NOTE — Progress Notes (Signed)
Daily Rounding Note  04/20/2015, 8:21 AM  LOS: 15 days   SUBJECTIVE:       Tolerated clears last night and this AM, diet advanced to fulls this AM and tolerated tomato soup.  Had large BM yesterday after milk/molasses enema yesterday.   OBJECTIVE:         Vital signs in last 24 hours:    Temp:  [98.1 F (36.7 C)-98.5 F (36.9 C)] 98.5 F (36.9 C) (09/27 0536) Pulse Rate:  [58-66] 66 (09/27 0536) Resp:  [18] 18 (09/27 0536) BP: (133-145)/(38-53) 133/53 mmHg (09/27 0536) SpO2:  [99 %-100 %] 99 % (09/27 0536) Weight:  [266 lb 8 oz (120.884 kg)] 266 lb 8 oz (120.884 kg) (09/27 0536) Last BM Date: 04/19/15 Filed Weights   04/17/15 0451 04/19/15 0614 04/20/15 0536  Weight: 263 lb 6.4 oz (119.477 kg) 262 lb 4.8 oz (118.978 kg) 266 lb 8 oz (120.884 kg)   General: obese, comfortable.  Unwell looking, not acutely ill.   Heart: RRR Chest: clear bil.  Diminished at bases.   No cough or labored breathing.  Abdomen: tense, distended, tympanitic/tinkling BS.  No tenderness  Extremities: + edema.  Right LE wound. Neuro/Psych:  Pleasant. Comfortable. No gross deficits.   Intake/Output from previous day: 09/26 0701 - 09/27 0700 In: 480 [P.O.:480] Out: 900 [Urine:900]  Intake/Output this shift:    Lab Results:  Recent Labs  04/19/15 0719 04/20/15 0348  WBC 7.4 8.6  HGB 11.4* 11.9*  HCT 36.1* 36.9*  PLT 220 238   BMET  Recent Labs  04/18/15 0321 04/19/15 0257 04/20/15 0348  NA 136 139 138  K 4.2 3.9 3.8  CL 95* 99* 96*  CO2 32 32 31  GLUCOSE 404* 160* 136*  BUN 43* 35* 35*  CREATININE 2.52* 2.33* 2.35*  CALCIUM 8.6* 8.7* 8.7*   LFT No results for input(s): PROT, ALBUMIN, AST, ALT, ALKPHOS, BILITOT, BILIDIR, IBILI in the last 72 hours. PT/INR  Recent Labs  04/19/15 0257 04/20/15 0348  LABPROT 33.6* 35.5*  INR 3.39* 3.65*   Hepatitis Panel No results for input(s): HEPBSAG, HCVAB, HEPAIGM, HEPBIGM in  the last 72 hours.  Studies/Results: No results found.  ASSESMENT:   * Ogilvie's syndrome, chronic/long-standing colonic ileus in bedridden patient.  04/14/15 flexible sigmoidoscopy with irrigation, lavage, decompression of left colon and placement of rectal tube. Rectal tube subsequently removed due to patient discomfort. Potassium/magnesium consistently normal.  On TID MiraLAX, BID senna, QID Simethicone. BM recorded yesterday.  2V abdominal films 9/25 with persistent diffuse ileus, clinically not acting obstructed.  * GNR positive urine culture, specific organism ID pending. Keflex in place.  * Long-standing IDDM   *  Chronic Coumadin for PA.  INR supratherapeutic.  S/p PPM.  S/p DCCV.   *  CKD, AKI.   *  OSA/OHS.  Not on CPAP.  Wonder if CPAP would help with his ileus?   *  diastolic heart failure.     PLAN   *  Mobilize as much as possible. If tolerates soft diet at lunch, consider discharge back to SNF.      Jennye Moccasin  04/20/2015, 8:21 AM Pager: [phone]     Attending physician's note   I have taken an interval history, reviewed the chart and examined the patient. I agree with the Advanced Practitioner's note, impression and recommendations. Continue current GI meds now and on return to SNF. Milk/molasses enemas every few days probably will be  needed as a long term mgmt plan in SNF. Continue to mobilize and move patient as much as possible. GI signing off.   Venita Lick. Russella Dar, MD [facility] [phone] Mon-Fri 8a-5p [phone] Mon-Fri 5p-8a, weekends, holidays or per [facility]

## 2015-04-20 NOTE — Progress Notes (Signed)
Dressing changed on left leg. Lightly patted wound with normal saline to clean wound. Wound is healing with small areas of pink dry healed skin noted. No odor or drainage present. Alginate applied and covered with ABD pad and wrapped with Ace bandage as ordered. Patient complained of no discomfort. Dressing clean dry and intact. Will continue to monitor patient to end of shift.

## 2015-04-20 NOTE — Progress Notes (Signed)
Advanced Heart Failure Rounding Note  Primary Physician: Dr. Cyndia Bent Primary Cardiologist: Dr [name] HF: [place]  Subjective:    Breathing fine.  Has 4 drinks on bedside table so advised to watch his fluid in take.  Is not sure he stood up to weight this morning. Encouraged to get up to chair as often as he can.  Fluid status has improved but ileus has worsened with immobility. GI following.  I/O's innaccurate with incontinence. Up 4 lbs.  Starting back Torsemide today. Cr back down with holding diuretics (2.99 -> 2.54 -> 2.50 -> 2.52 -> 2.33 ->2.35)   Objective:   Weight Range: 266 lb 8 oz (120.884 kg) Body mass index is 38.24 kg/(m^2).   Vital Signs:   Temp:  [98.1 F (36.7 C)-98.5 F (36.9 C)] 98.5 F (36.9 C) (09/27 0536) Pulse Rate:  [58-66] 66 (09/27 0536) Resp:  [18] 18 (09/27 0536) BP: (133-145)/(38-53) 133/53 mmHg (09/27 0536) SpO2:  [99 %-100 %] 99 % (09/27 0536) Weight:  [266 lb 8 oz (120.884 kg)] 266 lb 8 oz (120.884 kg) (09/27 0536) Last BM Date: 04/19/15  Weight change: Filed Weights   04/17/15 0451 04/19/15 0614 04/20/15 0536  Weight: 263 lb 6.4 oz (119.477 kg) 262 lb 4.8 oz (118.978 kg) 266 lb 8 oz (120.884 kg)    Intake/Output:   Intake/Output Summary (Last 24 hours) at 04/20/15 0747 Last data filed at 04/20/15 0000  Gross per 24 hour  Intake    480 ml  Output    900 ml  Net   -420 ml     Physical Exam: General: Obese, Elderly and chronically ill appearing. HEENT: normal Neck: supple. Thick, JVP 8-9 cm. Carotids 2+ bilat; no bruits. No lymphadenopathy or thyromegaly noted. Cor: PMI nondisplaced. RRR. No M/G/R. Lungs: Diminished bases. Abdomen: Obese, NT, +severely distended without bowel sounds. No HSM. No bruits or masses appreciated.  Extremities: no cyanosis, clubbing, rash. Trace edema. Covered wound on R LE.  Neuro: alert & orientedx3, cranial nerves grossly intact. moves all 4 extremities w/o difficulty. Affect flat  Telemetry:   discontinued  Labs: CBC  Recent Labs  04/19/15 0719 04/20/15 0348  WBC 7.4 8.6  HGB 11.4* 11.9*  HCT 36.1* 36.9*  MCV 89.8 89.1  PLT 220 238   Basic Metabolic Panel  Recent Labs  04/19/15 0257 04/20/15 0348  NA 139 138  K 3.9 3.8  CL 99* 96*  CO2 32 31  GLUCOSE 160* 136*  BUN 35* 35*  CALCIUM 8.7* 8.7*   Liver Function Tests No results for input(s): AST, ALT, ALKPHOS, BILITOT, PROT, ALBUMIN in the last 72 hours. No results for input(s): LIPASE, AMYLASE in the last 72 hours. Cardiac Enzymes No results for input(s): CKTOTAL, CKMB, CKMBINDEX, TROPONINI in the last 72 hours.  BNP: BNP (last 3 results)  Recent Labs  03/15/15 1820 04/05/15 1537 04/11/15 1020  BNP 195.8* 317.1* 53.9    ProBNP (last 3 results) No results for input(s): PROBNP in the last 8760 hours.   D-Dimer No results for input(s): DDIMER in the last 72 hours. Hemoglobin A1C No results for input(s): HGBA1C in the last 72 hours. Fasting Lipid Panel No results for input(s): CHOL, HDL, LDLCALC, TRIG, CHOLHDL, LDLDIRECT in the last 72 hours. Thyroid Function Tests No results for input(s): TSH, T4TOTAL, T3FREE, THYROIDAB in the last 72 hours.  Invalid input(s): FREET3  Imaging/Studies:  No results found.  Latest Echo  Latest Cath   Medications:     Scheduled Medications: . amiodarone  200 mg Oral BID  . amLODipine  2.5 mg Oral Daily  . atorvastatin  40 mg Oral q1800  . cephALEXin  500 mg Oral 4 times per day  . cycloSPORINE  1 drop Both Eyes BID  . donepezil  10 mg Oral QHS  . gabapentin  100 mg Oral TID  . Glycerin (Adult)  1 suppository Rectal Once  . insulin aspart  0-20 Units Subcutaneous TID WC  . insulin aspart  0-5 Units Subcutaneous QHS  . insulin aspart  10 Units Subcutaneous TID WC  . insulin glargine  12 Units Subcutaneous q morning - 10a  . insulin glargine  25 Units Subcutaneous QHS  . loratadine  10 mg Oral Daily  . mirabegron ER  50 mg Oral Daily  .  mometasone-formoterol  2 puff Inhalation BID  . montelukast  10 mg Oral QHS  . mupirocin cream  1 application Topical Daily  . pantoprazole  40 mg Oral BID  . polyethylene glycol  17 g Oral TID  . sertraline  25 mg Oral Daily  . simethicone  80 mg Oral QID  . sodium chloride  3 mL Intravenous Q12H  . tamsulosin  0.4 mg Oral Daily  . torsemide  50 mg Oral Daily  . Warfarin - Pharmacist Dosing Inpatient   Does not apply q1800    Infusions: . sodium chloride 20 mL/hr at 04/14/15 2344    PRN Medications: sodium chloride, acetaminophen, albuterol, diphenhydrAMINE-zinc acetate, hydrALAZINE, ondansetron, sodium chloride   Assessment/Plan   Tyler [name] is a 81 y.o. male with history of Chronic diastolic CHF, Echo 03/16/15 Echo EF 55-60%, Grade 2 DD, mildly dilated RV, hx of sick sinus syndrome s/p STJ dual chamber PCM 2013, paroxysmal a fib stable on coumadin, OHS/OSA non-compliant with CPAP, HTN, and DM who presented with worsening SOB, orthopnea, and edema.  1. Acute on chronic diastolic HF, EF 55-60%, grade 2 DD, mildly dilated RV. s/p STJ dual chamber PPM 2013 - Volume status improved from admit.  ~ 20 lbs off. - Cr back down, will start back on previous dose of 50 mg torsemide daily now no longer NPO. - Continue daily weights, strict I/O, and fluid restriction.  - Has HF clinic follow up scheduled for 05/03/15 2. OSA/OHS - Likely major contributors to his symptoms as well as his RV dysfunction.  - Needs to follow up with pulmonary sleep medicine at discharge. 3. Paroxysmal a fib - CHA2DS2VASC score at least 5 (7.2% risk of stroke per year) - On chronic coumadin - hx of cardioversion - Regular rhythm on exam. - Interrogation showed 49% afib burden since 11/02/14 - Started on amio 200 mg BID 9/13, Decrease to 200 mg daily today - LFTs mildly elevated 9/21. 4. 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 Hyperkalemia - Improved. Continue to monitor.  Corrected to hypo.  Supp as needed. 6. HTN 7. Hx of sick sinus syndrome/symptomatic bradycardia - s/p STJ dual chamber PPM 2013 as above. 8. DM 9. Ogilvie's syndrome : Acute on chronic - GI following. - Illeus has worsened this admission and is currently the limiting factor of his discharge.  Dispo: Has room at [facility] once medically stable.  Length of Stay: [address]  Graciella Freer PA-C 04/20/2015, 7:47 AM  Advanced Heart Failure Team Pager [phone] (M-F; 7a - 4p)  Please contact [facility]  for night-coverage after hours (4p -7a ) and weekends on [email or web address]   Patient seen with PA, agree with the above note.  Restart torsemide 50 mg daily now that he has resumed a diet.  Decrease amiodarone to 200 mg daily.    We will follow at a distance at this point, call with questions.   Tyler [name] 04/20/2015 1:31 PM

## 2015-04-21 LAB — GLUCOSE, CAPILLARY
Glucose-Capillary: 200 mg/dL — ABNORMAL HIGH (ref 65–99)
Glucose-Capillary: 236 mg/dL — ABNORMAL HIGH (ref 65–99)

## 2015-04-21 LAB — BASIC METABOLIC PANEL
ANION GAP: 7 (ref 5–15)
BUN: 38 mg/dL — ABNORMAL HIGH (ref 6–20)
CALCIUM: 8.3 mg/dL — AB (ref 8.9–10.3)
CO2: 32 mmol/L (ref 22–32)
Chloride: 95 mmol/L — ABNORMAL LOW (ref 101–111)
Creatinine, Ser: 2.52 mg/dL — ABNORMAL HIGH (ref 0.61–1.24)
GFR, EST AFRICAN AMERICAN: 26 mL/min — AB (ref >60)
GFR, EST NON AFRICAN AMERICAN: 23 mL/min — AB (ref >60)
Glucose, Bld: 244 mg/dL — ABNORMAL HIGH (ref 65–99)
Potassium: 3.9 mmol/L (ref 3.5–5.1)
Sodium: 134 mmol/L — ABNORMAL LOW (ref 135–145)

## 2015-04-21 LAB — URINE CULTURE: Culture: >=100000

## 2015-04-21 LAB — HEPATIC FUNCTION PANEL
ALT: 55 U/L (ref 17–63)
AST: 39 U/L (ref 15–41)
Albumin: 3.1 g/dL — ABNORMAL LOW (ref 3.5–5.0)
Alkaline Phosphatase: 107 U/L (ref 38–126)
BILIRUBIN DIRECT: 0.1 mg/dL (ref 0.1–0.5)
Indirect Bilirubin: 0.5 mg/dL (ref 0.3–0.9)
TOTAL PROTEIN: 7.1 g/dL (ref 6.5–8.1)
Total Bilirubin: 0.6 mg/dL (ref 0.3–1.2)

## 2015-04-21 LAB — PROTIME-INR
INR: 3.29 — AB (ref 0.00–1.49)
PROTHROMBIN TIME: 32.8 s — AB (ref 11.6–15.2)

## 2015-04-21 MED ORDER — CIPROFLOXACIN HCL 500 MG PO TABS: 500.0000 mg | ORAL_TABLET | Freq: Every day | ORAL | Status: AC

## 2015-04-21 MED ORDER — AMIODARONE HCL 200 MG PO TABS: 200.0000 mg | ORAL_TABLET | Freq: Every day | ORAL | Status: AC

## 2015-04-21 MED ORDER — INSULIN GLARGINE 100 UNIT/ML ~~LOC~~ SOLN: 40.0000 [IU] | Freq: Every morning | SUBCUTANEOUS | Status: AC

## 2015-04-21 MED ORDER — POLYETHYLENE GLYCOL 3350 17 G PO PACK: 17.0000 g | PACK | Freq: Two times a day (BID) | ORAL | Status: AC

## 2015-04-21 MED ORDER — ATORVASTATIN CALCIUM 40 MG PO TABS: 40.0000 mg | ORAL_TABLET | Freq: Every day | ORAL | Status: AC

## 2015-04-21 MED ORDER — SIMETHICONE 80 MG PO CHEW: 80.0000 mg | CHEWABLE_TABLET | Freq: Four times a day (QID) | ORAL | Status: AC

## 2015-04-21 MED ORDER — METOPROLOL TARTRATE 25 MG PO TABS: 12.5000 mg | ORAL_TABLET | Freq: Two times a day (BID) | ORAL | Status: AC

## 2015-04-21 MED ORDER — SENNA 8.6 MG PO TABS: 1.0000 | ORAL_TABLET | Freq: Two times a day (BID) | ORAL | Status: AC

## 2015-04-21 MED ORDER — WARFARIN SODIUM 3 MG PO TABS: 3.0000 mg | ORAL_TABLET | Freq: Every day | ORAL | Status: AC

## 2015-04-21 MED ORDER — HYDROCODONE-ACETAMINOPHEN 5-325 MG PO TABS: 1.0000 | ORAL_TABLET | Freq: Three times a day (TID) | ORAL | Status: AC | PRN

## 2015-04-21 NOTE — Progress Notes (Signed)
Physical Therapy Treatment Patient Details Name: Tyler [name] MRN: [id number] DOB: 1934-02-06 Today's Date: 04/21/2015    History of Present Illness 81 y.o. male admitted with dyspnea and ileus. PMH is significant for CHF, SSS s/p pacemaker, a flutter/fib on warfarin, CKD, HTN, T2DM, HLD, BPH, Asthma, OSA, anemia    PT Comments    Slow progress due to continued knee pain, but reported less pain this session.  Stiff neck and trunk also limiting mobility, so work on mobility in seated position with good response.  Will need continued skilled PT in SNF setting at d/c.  Follow Up Recommendations  SNF;Supervision for mobility/OOB     Equipment Recommendations  None recommended by PT    Recommendations for Other Services       Precautions / Restrictions Precautions Precautions: Fall    Mobility  Bed Mobility         Supine to sit: HOB elevated;Min assist     General bed mobility comments: able to get legs off bed, assisted with trunk only with HOB elevated.  Difficulty scooting forward on bed with elevation and rail in the way  Transfers Overall transfer level: Needs assistance Equipment used: Rolling walker (2 wheeled) Transfers: Sit to/from Stand Sit to Stand: +2 physical assistance;Mod assist;Max assist Stand pivot transfers: +2 physical assistance;Min assist       General transfer comment: from edge of bed needed increased assist due to difficulty getting anterior weight shift due to bed position; stand pivot with RW and assist for safety c/o right knee pain in standing.  Improved sit to stand from chair due to armrests and feet better grounded on the floor.  Ambulation/Gait                 Stairs            Wheelchair Mobility    Modified Rankin (Stroke Patients Only)       Balance Overall balance assessment: Needs assistance         Standing balance support: Bilateral upper extremity supported Standing balance-Leahy Scale:  Poor Standing balance comment: standing x 30 sec with UE support and minguard assist                    Cognition Arousal/Alertness: Awake/alert Behavior During Therapy: Flat affect Overall Cognitive Status: Within Functional Limits for tasks assessed                      Exercises General Exercises - Lower Extremity Ankle Circles/Pumps: AROM;Both;5 reps;Supine Heel Slides: AROM;Both;5 reps;Supine Hip Flexion/Marching: AROM;Both;5 reps;Seated Other Exercises Other Exercises: cervical AROM x 5 rotation and flex/ext Other Exercises: trunk rotation seated in chair with assist x 5 Other Exercises: seated ant pelvic tilts with chest expansion x 5 reps mod facilitation    General Comments        Pertinent Vitals/Pain Pain Score: 6  Pain Location: right knee in standing Pain Descriptors / Indicators: Aching Pain Intervention(s): Ice applied;Repositioned;Monitored during session    Home Living                      Prior Function            PT Goals (current goals can now be found in the care plan section) Progress towards PT goals: Progressing toward goals    Frequency  Min 3X/week    PT Plan Current plan remains appropriate    Co-evaluation  End of Session Equipment Utilized During Treatment: Gait belt Activity Tolerance: Patient limited by pain Patient left: in chair;with call bell/phone within reach;with chair alarm set     Time: 1610-9604 PT Time Calculation (min) (ACUTE ONLY): 24 min  Charges:  $Therapeutic Exercise: 8-22 mins $Therapeutic Activity: 8-22 mins                    G Codes:      WYNN,CYNDI 04-27-2015, 2:26 PM  Sheran Lawless, PT [phone] 04/27/2015

## 2015-04-21 NOTE — Clinical Social Work Placement (Signed)
   CLINICAL SOCIAL WORK PLACEMENT  NOTE  Date:  04/21/2015  Patient Details  Name: [name] MRN: [id number] Date of Birth: 12/06/1933  Clinical Social Work is seeking post-discharge placement for this patient at the Skilled  Nursing Facility level of care (*CSW will initial, date and re-position this form in  chart as items are completed):  Yes   Patient/family provided with [facility] Clinical Social Work Department's list of facilities offering this level of care within the geographic area requested by the patient (or if unable, by the patient's family).  Yes   Patient/family informed of their freedom to choose among providers that offer the needed level of care, that participate in Medicare, Medicaid or managed care program needed by the patient, have an available bed and are willing to accept the patient.  Yes   Patient/family informed of [facility]'s ownership interest in [facility] and [facility], as well as of the fact that they are under no obligation to receive care at these facilities.  PASRR submitted to EDS on       PASRR number received on       Existing PASRR number confirmed on 04/12/15     FL2 transmitted to all facilities in geographic area requested by pt/family on 04/12/15     FL2 transmitted to all facilities within larger geographic area on       Patient informed that his/her managed care company has contracts with or will negotiate with certain facilities, including the following:            Patient/family informed of bed offers received.  Patient chooses bed at       Physician recommends and patient chooses bed at      Patient to be transferred to   on  .  Patient to be transferred to facility by       Patient family notified on   of transfer.  Name of family member notified:        PHYSICIAN Please prepare priority discharge summary, including medications, Please sign DNR, Please sign FL2     Additional Comment:     _______________________________________________ Darylene Price, LCSW 04/21/2015, 3:16 PM

## 2015-04-21 NOTE — Progress Notes (Signed)
Soap suds enema given and patient tolerated well. Patient had very large bowel movement. Nurse and NurseTech cleaned and bathed patient from head to toe. Patient complains of no pain or discomfort. Will continue to monitor patient to end of shift.

## 2015-04-21 NOTE — Discharge Summary (Signed)
Pt discharged to [facility] health care center, Report provided to the RN in the unit, pt left via ambulance with all of his belongings, condom catheter on, in a stable condition.

## 2015-04-21 NOTE — Clinical Social Work Placement (Signed)
   CLINICAL SOCIAL WORK PLACEMENT  NOTE  Date:  04/21/2015  Patient Details  Name: Tyler [name] MRN: [id number] Date of Birth: 1934-01-21  Clinical Social Work is seeking post-discharge placement for this patient at the Skilled  Nursing Facility level of care (*CSW will initial, date and re-position this form in  chart as items are completed):  Yes   Patient/family provided with [place] Clinical Social Work Department's list of facilities offering this level of care within the geographic area requested by the patient (or if unable, by the patient's family).  Yes   Patient/family informed of their freedom to choose among providers that offer the needed level of care, that participate in Medicare, Medicaid or managed care program needed by the patient, have an available bed and are willing to accept the patient.  Yes   Patient/family informed of [facility]'s ownership interest in [facility] and [facility], as well as of the fact that they are under no obligation to receive care at these facilities.  PASRR submitted to EDS on       PASRR number received on       Existing PASRR number confirmed on 04/12/15     FL2 transmitted to all facilities in geographic area requested by pt/family on 04/12/15     FL2 transmitted to all facilities within larger geographic area on       Patient informed that his/her managed care company has contracts with or will negotiate with certain facilities, including the following:        Yes   Patient/family informed of bed offers received.  Patient chooses bed at [facility]     Physician recommends and patient chooses bed at      Patient to be transferred to [facility] on 04/21/15.  Patient to be transferred to facility by Ambulance  [name])     Patient family notified on 04/21/15 of transfer.  Name of family member notified:  [name]     PHYSICIAN Please prepare priority discharge summary,  including medications, Please sign DNR, Please sign FL2     Additional Comment:  OK per MD for d/c today to SNF for continued rehab.  Patient noted to now have a positive urine culture; MD aware and placed patient on oral antibiotic.  Patient now requires isolation. Notified Clydie Braun at [facility]. They do not have a private room but are able to place patient in a semi-private room with no roommate. Notified patient and daughter [name]. Nursing notified to call report.  Patient states that he is feeling better and is ok with d/c plan to [facility] as long as his daughters are in agreement.  No further CSW needs identified. CSW signing off.  Lorri Frederick. Andria Rhein [phone]        _______________________________________________ Darylene Price, LCSW 04/21/2015, 3:18 PM

## 2015-04-21 NOTE — Progress Notes (Signed)
ANTICOAGULATION CONSULT NOTE - Follow Up Consult  Pharmacy Consult for Coumadin Indication: atrial fibrillation   Patient Measurements: Height:  (177.8 cm) Weight: 259 lb 1.6 oz (117.527 kg) IBW/kg (Calculated) : 73  Vital Signs:    Labs:  Recent Labs  04/19/15 0257 04/19/15 0719 04/20/15 0348 04/21/15 0900  HGB  --  11.4* 11.9*  --   HCT  --  36.1* 36.9*  --   PLT  --  220 238  --   LABPROT 33.6*  --  35.5* 32.8*  INR 3.39*  --  3.65* 3.29*  CREATININE 2.33*  --  2.35* 2.52*    Estimated Creatinine Clearance: 30.5 mL/min (by C-G formula based on Cr of 2.52).  Assessment: 81 year old male continues on coumadin for afib. PTA was on 7.5 mg daily. INR therapeutic on admission. Patient was stable here on  daily but now up 3.65.  Hg 11.9 pltc 238.  INR elevation may be due to amiodarone and possibly keflex. (He was not on these PTA). Likely to SNF today -Keflex added 9/25. -AST/ALT= 39/55   Goal of Therapy:  INR 2-3 Monitor platelets by anticoagulation protocol: Yes   Plan:  -Hold Coumadin today  -When coumadin resumes would consider a 50% reduction in his home regimen (could consider 3-4mg /day) -Daily INR   Harland German, Pharm D 04/21/2015 10:04 AM

## 2015-04-21 NOTE — Progress Notes (Signed)
Family Medicine Teaching Service Daily Progress Note Intern Pager: [phone]  Patient name: Tyler [name] Medical record number: [id number] Date of birth: 12/04/1933 Age: 81 y.o. Gender: male  Primary Care Provider: [name], MD Consultants: GI, HF Code Status: FULL  Pt Overview and Major Events to Date:  9/12: Admitted for shortness of breath concerning for CHF exacerbation, Wt 291 9/13: Continued on Lasix drip. Wt 280lbs. Amiodarone started for PAF. 9/14: Continued Lasix drip. Wt 281lbs. 9/15: Continued Lasix drip, 1 dose Metolazone. 9/16: Wt down to 273lbs.  9/18: Wt down to 275 lbs. Switched to PO diuretics. Creatinine creeping up. Constipation continues. Repeat enema 9/20: Constipation continues; ordered milk and molases enema  9/21: Reconsulted GI; repeat KUB showed chronic distention of colon, increased in the mid sigmoid region--> unprepped flex sig for decompression  9/22: repeat KUB: Decompression of the colon after rectal tube placement with moderate gaseous distention of the sigmoid colon persisting. No evidence of bowel obstruction or free intraperitoneal air.  9/23: KUB: Air-filled dilated colon is noted most consistent with ileus; removal of rectal tube, enema given 9/24: KUB: Unchanged diffuse gaseous distention of the colon suggestive of ileus. No definite small bowel dilatation. Per GI continue on bowel regimen and ambulate 9/25: Per GI, placed on NPO and continue bowel regimen 9/26: Transitioned to clears; enema with good response; HF restarted Torsemide  Assessment and Plan: Tyler [name] is a 81 y.o. male presenting with recurrent hypoxemic respiratory failure from acute CHF exacerbation. PMH is significant for HFpEF, HTN, HLD, T2DM, OSA, OHS, SSS and PAF s/p dual chamber pacer 2013.  Acute on Chronic Diastolic HF, HFpEF: Echo on most recent admission with EF 55-60%, G2DD. UOP: ~1L over last 24 hours. Weight down from 266lb yesterday to 259  today (admin 291lb; recent discharge wt 02/2015 179lb). Clinically euvolemic. - Heart Failure team following, appreciate recs: Torsemide  daily restarted 9/27 due to Cr improvement; stable from cardiac perspective. Has HF follow-up in clinic on 10/10. - Daily weights, strict I/O, fluid restriction - Continue Metoprolol 12.5mg  BID. HRs ranging from 59-66 overnight. BP 124/54 this morning. - Continue atorvastatin 40 mg PO daily - Currently on IVF 9ml/hr  Chronic Intestinal Constipation: KUB 9/24: unchanged diffuse gaseous distention of the colon suggestive of Ileus. Abdominal Exam unchanged. Pt given soap suds enema this morning and had a very large bowel movement. - GI reconsulted, appreciate recs. Recommend frequent turning while in bed, maximize ambulation, continue Miralax tid, Senna bid, and Simethicone qid. If tolerates soft diet, can go to SNF. - Tolerating soft diet yesterday and this morning. - Miralax 17g TID  - Senokot 8.6 BID - Will start soap suds enema daily - Continue simethicone and PPI - Hold anticholinergics on discharge - Zofran  PRN for nausea  AKI on Stage III CKD: Presumed baseline Cr ~2 . Peak of 2.99 which has been improving since discontinuing diuresis. Cr this am is 2.52, slightly bumped from 2.35 yesterday . - Continue to monitor  - Milk of Magnesia outpatient prn constipation treatment should be discontinued at discharge due to SCr persistently >2mg /dl.  UTI: patient with dysuria, hematuria, change in quality. History of recent recurrent Klebsiella UTI due to Bladder outlet obstruction from BPH. Patient afebrile with no flank pain. No gross hematuria. UA: large LE, neg nitrites, WBC 7-10, RBC 21-50. - Urine culture: >100,000 colonies gram neg rods  - Keflex  QID (day 4); will treat total 5 day course   Acute Hypoxemic Respiratory Failure: Multifactorial with  volume overload and abdominal distention worsening pre-existing asthma, OHS, OSA, and 20  pack/year smoking history with likely undiagnosed COPD. Required O2 2L initially, now on RA with good saturations.  - Continue allergy/reactive airway therapy: flonase, loratadine, singulair, formulary LABA/ICS, PRN albuterol - Patient will need CPAP on outpatient basis  Hypertension: Blood pressure stable at 124/54 this morning. - Metoprolol 12.5mg  bid. Norvasc has been discontinued.  - Hydralazine  IV q2h PRN for SBP > 170. Would avoid this if possible given the possibility of cardiorenal syndrome.   Type II Diabetes Mellitus, Insulin-Dependent: Hb A1c 6.6% on 9/8. Home dose is 27u qAM, 45u qPM + SSI. CBGs have ranged from 110-246 in the last 24 hours. 15 units SSI over the last 24 hrs. - Was on AM lantus 10u; PM lantus 25u. Will simplify regimen and will discharge home on 40 units of Lantus in the am. - Novolog 10 units TID with meals - Continue resistant SSI - Diet advanced to full liquid  Arrhythmias: Sick Sinus, Paroxysmal AFib/Flutter: with dual chamber pacer in situ. INR this morning 3.29, down from 3.65 yesterday.  - On coumadin per pharmacy - Amiodarone 200 mg BID started (9/13)-> transitioned to Amiodarone  daily per HF   Chronic Pain: - Continue to hold narcotics as can be contributing to constipation - Discontinued home mobic given CHF - Tylenol PRN pain  Heart Burn: Chronic issue per patient - Protonix  BID - will monitor symptoms   Social: Patient is being treated for cognitive impairment. Tyler [name] daughter, [name], requests daily updates to which the patient has consented verbally. - Continue Donepezil.  Chronic RLE wound: Followed at wound care clinic as outpatient.  - WOC recommendations appreciated.  FEN/GI: Tolerating soft foods Prophylaxis: Therapeutic coumadin  Disposition: Discharge [facility] to [facility].  Subjective:  He has eaten well last night and this morning. He says he is having a little bit of cramping. He had  a soap suds enema this morning and had a very large BM afterwards.  Objective: Temp:  [98.5 F (36.9 C)] 98.5 F (36.9 C) (09/27 2142) Pulse Rate:  [59-64] 59 (09/27 2142) Resp:  [18-20] 20 (09/27 2142) BP: (124)/(54) 124/54 mmHg (09/27 2142) SpO2:  [99 %] 99 % (09/27 2142) Weight:  [259 lb 1.6 oz (117.527 kg)] 259 lb 1.6 oz (117.527 kg) (09/28 0414) Physical Exam: Gen: Pleasant elderly male CV: RRR, no LE edema, no JVD Pulm: Nonlabored, decreased sounds at bases bilaterally, no crackles/wheezing Abd: +BS, abdomen distended, no TTP, soft Extremities: RLE bandaged (chronic wound): clean and dry. No LE edema Neuro: Oriented, speech normal  Laboratory:  Recent Labs Lab 04/19/15 0719 04/20/15 0348  WBC 7.4 8.6  HGB 11.4* 11.9*  HCT 36.1* 36.9*  PLT 220 238    Recent Labs Lab 04/18/15 0321 04/19/15 0257 04/20/15 0348  NA 136 139 138  K 4.2 3.9 3.8  CL 95* 99* 96*  CO2 32 32 31  BUN 43* 35* 35*  CREATININE 2.52* 2.33* 2.35*  CALCIUM 8.6* 8.7* 8.7*  PROT  --   --  7.6  BILITOT  --   --  0.4  ALKPHOS  --   --  107  ALT  --   --  62  AST  --   --  39  GLUCOSE 404* 160* 136*     Tavaras Goody Gay Filler, MD 04/21/2015, 8:15 AM PGY-1, [facility] Family Medicine FPTS Intern pager: [phone], text pages welcome

## 2015-04-28 ENCOUNTER — Telehealth: Payer: Self-pay | Admitting: *Deleted

## 2015-04-30 NOTE — Telephone Encounter (Signed)
Entered in error

## 2015-05-03 ENCOUNTER — Encounter (HOSPITAL_COMMUNITY): Payer: Medicare Other

## 2015-05-21 ENCOUNTER — Emergency Department (HOSPITAL_COMMUNITY): Payer: Medicare Other

## 2015-05-21 ENCOUNTER — Inpatient Hospital Stay (HOSPITAL_COMMUNITY)
Admission: EM | Admit: 2015-05-21 | Discharge: 2015-05-25 | DRG: 308 | Disposition: E | Payer: Medicare Other | Attending: Family Medicine | Admitting: Family Medicine

## 2015-05-21 ENCOUNTER — Telehealth (HOSPITAL_COMMUNITY): Payer: Self-pay

## 2015-05-21 DIAGNOSIS — Z794 Long term (current) use of insulin: Secondary | ICD-10-CM

## 2015-05-21 DIAGNOSIS — I5033 Acute on chronic diastolic (congestive) heart failure: Secondary | ICD-10-CM | POA: Diagnosis present

## 2015-05-21 DIAGNOSIS — I4901 Ventricular fibrillation: Secondary | ICD-10-CM | POA: Diagnosis not present

## 2015-05-21 DIAGNOSIS — I462 Cardiac arrest due to underlying cardiac condition: Secondary | ICD-10-CM | POA: Diagnosis present

## 2015-05-21 DIAGNOSIS — I130 Hypertensive heart and chronic kidney disease with heart failure and stage 1 through stage 4 chronic kidney disease, or unspecified chronic kidney disease: Secondary | ICD-10-CM | POA: Diagnosis present

## 2015-05-21 DIAGNOSIS — I469 Cardiac arrest, cause unspecified: Secondary | ICD-10-CM | POA: Diagnosis not present

## 2015-05-21 DIAGNOSIS — Z515 Encounter for palliative care: Secondary | ICD-10-CM | POA: Diagnosis present

## 2015-05-21 DIAGNOSIS — Z6841 Body Mass Index (BMI) 40.0 and over, adult: Secondary | ICD-10-CM

## 2015-05-21 DIAGNOSIS — E872 Acidosis: Secondary | ICD-10-CM | POA: Diagnosis present

## 2015-05-21 DIAGNOSIS — E1122 Type 2 diabetes mellitus with diabetic chronic kidney disease: Secondary | ICD-10-CM | POA: Diagnosis present

## 2015-05-21 DIAGNOSIS — J9601 Acute respiratory failure with hypoxia: Secondary | ICD-10-CM | POA: Diagnosis present

## 2015-05-21 DIAGNOSIS — I110 Hypertensive heart disease with heart failure: Secondary | ICD-10-CM | POA: Insufficient documentation

## 2015-05-21 DIAGNOSIS — I447 Left bundle-branch block, unspecified: Secondary | ICD-10-CM | POA: Diagnosis present

## 2015-05-21 DIAGNOSIS — Z7901 Long term (current) use of anticoagulants: Secondary | ICD-10-CM

## 2015-05-21 DIAGNOSIS — J9602 Acute respiratory failure with hypercapnia: Secondary | ICD-10-CM | POA: Diagnosis present

## 2015-05-21 DIAGNOSIS — Z66 Do not resuscitate: Secondary | ICD-10-CM | POA: Diagnosis present

## 2015-05-21 DIAGNOSIS — J449 Chronic obstructive pulmonary disease, unspecified: Secondary | ICD-10-CM | POA: Diagnosis present

## 2015-05-21 DIAGNOSIS — I4891 Unspecified atrial fibrillation: Secondary | ICD-10-CM | POA: Diagnosis present

## 2015-05-21 DIAGNOSIS — N189 Chronic kidney disease, unspecified: Secondary | ICD-10-CM | POA: Diagnosis present

## 2015-05-21 DIAGNOSIS — Z950 Presence of cardiac pacemaker: Secondary | ICD-10-CM

## 2015-05-21 DIAGNOSIS — N179 Acute kidney failure, unspecified: Secondary | ICD-10-CM | POA: Diagnosis present

## 2015-05-21 DIAGNOSIS — G931 Anoxic brain damage, not elsewhere classified: Secondary | ICD-10-CM | POA: Diagnosis present

## 2015-05-21 LAB — I-STAT CHEM 8, ED
BUN: 44 mg/dL — ABNORMAL HIGH (ref 6–20)
CALCIUM ION: 1.04 mmol/L — AB (ref 1.13–1.30)
CHLORIDE: 106 mmol/L (ref 101–111)
Creatinine, Ser: 3 mg/dL — ABNORMAL HIGH (ref 0.61–1.24)
Glucose, Bld: 232 mg/dL — ABNORMAL HIGH (ref 65–99)
HCT: 31 % — ABNORMAL LOW (ref 39.0–52.0)
HEMOGLOBIN: 10.5 g/dL — AB (ref 13.0–17.0)
POTASSIUM: 4.7 mmol/L (ref 3.5–5.1)
SODIUM: 139 mmol/L (ref 135–145)
TCO2: 20 mmol/L (ref 0–100)

## 2015-05-21 LAB — COMPREHENSIVE METABOLIC PANEL
ALK PHOS: 109 U/L (ref 38–126)
ALT: 49 U/L (ref 17–63)
ANION GAP: 13 (ref 5–15)
AST: 60 U/L — ABNORMAL HIGH (ref 15–41)
Albumin: 2.9 g/dL — ABNORMAL LOW (ref 3.5–5.0)
BILIRUBIN TOTAL: 0.7 mg/dL (ref 0.3–1.2)
BUN: 42 mg/dL — ABNORMAL HIGH (ref 6–20)
CALCIUM: 8.1 mg/dL — AB (ref 8.9–10.3)
CO2: 20 mmol/L — ABNORMAL LOW (ref 22–32)
CREATININE: 3.18 mg/dL — AB (ref 0.61–1.24)
Chloride: 108 mmol/L (ref 101–111)
GFR, EST AFRICAN AMERICAN: 20 mL/min — AB (ref >60)
GFR, EST NON AFRICAN AMERICAN: 17 mL/min — AB (ref >60)
Glucose, Bld: 236 mg/dL — ABNORMAL HIGH (ref 65–99)
Potassium: 4.9 mmol/L (ref 3.5–5.1)
SODIUM: 141 mmol/L (ref 135–145)
TOTAL PROTEIN: 6.4 g/dL — AB (ref 6.5–8.1)

## 2015-05-21 LAB — CBC WITH DIFFERENTIAL/PLATELET
Basophils Absolute: 0 10*3/uL (ref 0.0–0.1)
Basophils Relative: 0 %
EOS ABS: 0.3 10*3/uL (ref 0.0–0.7)
Eosinophils Relative: 4 %
HEMATOCRIT: 32.7 % — AB (ref 39.0–52.0)
HEMOGLOBIN: 10.1 g/dL — AB (ref 13.0–17.0)
LYMPHS ABS: 1.6 10*3/uL (ref 0.7–4.0)
LYMPHS PCT: 22 %
MCH: 28.2 pg (ref 26.0–34.0)
MCHC: 30.9 g/dL (ref 30.0–36.0)
MCV: 91.3 fL (ref 78.0–100.0)
MONOS PCT: 5 %
Monocytes Absolute: 0.3 10*3/uL (ref 0.1–1.0)
NEUTROS PCT: 69 %
Neutro Abs: 5.1 10*3/uL (ref 1.7–7.7)
Platelets: 156 10*3/uL (ref 150–400)
RBC: 3.58 MIL/uL — ABNORMAL LOW (ref 4.22–5.81)
RDW: 13.8 % (ref 11.5–15.5)
WBC: 7.4 10*3/uL (ref 4.0–10.5)

## 2015-05-21 LAB — I-STAT CG4 LACTIC ACID, ED: Lactic Acid, Venous: 5.83 mmol/L (ref 0.5–2.0)

## 2015-05-21 LAB — I-STAT ARTERIAL BLOOD GAS, ED
ACID-BASE DEFICIT: 4 mmol/L — AB (ref 0.0–2.0)
Bicarbonate: 23 mEq/L (ref 20.0–24.0)
O2 Saturation: 100 %
PH ART: 7.259 — AB (ref 7.350–7.450)
TCO2: 25 mmol/L (ref 0–100)
pCO2 arterial: 50.9 mmHg — ABNORMAL HIGH (ref 35.0–45.0)
pO2, Arterial: 523 mmHg — ABNORMAL HIGH (ref 80.0–100.0)

## 2015-05-21 LAB — TYPE AND SCREEN
ABO/RH(D): A POS
Antibody Screen: NEGATIVE

## 2015-05-21 LAB — ABO/RH: ABO/RH(D): A POS

## 2015-05-21 LAB — PROTIME-INR
INR: 4.87 — ABNORMAL HIGH (ref 0.00–1.49)
Prothrombin Time: 44.1 seconds — ABNORMAL HIGH (ref 11.6–15.2)

## 2015-05-21 LAB — APTT: APTT: 62 s — AB (ref 24–37)

## 2015-05-21 LAB — I-STAT TROPONIN, ED: TROPONIN I, POC: 0.04 ng/mL (ref 0.00–0.08)

## 2015-05-21 MED ORDER — LORAZEPAM 2 MG/ML IJ SOLN
INTRAMUSCULAR | Status: AC
  Filled 2015-05-21: qty 1

## 2015-05-21 MED ORDER — SODIUM CHLORIDE 0.9 % IV BOLUS (SEPSIS)
1000.0000 mL | Freq: Once | INTRAVENOUS | Status: AC
  Administered 2015-05-21: 1000 mL via INTRAVENOUS

## 2015-05-21 MED ORDER — SODIUM CHLORIDE 0.9 % IV SOLN
10.0000 mg/h | INTRAVENOUS | Status: DC
  Administered 2015-05-21: 2 mg/h via INTRAVENOUS
  Filled 2015-05-21: qty 10

## 2015-05-21 MED ORDER — MORPHINE SULFATE (PF) 4 MG/ML IV SOLN
INTRAVENOUS | Status: AC
  Filled 2015-05-21: qty 2

## 2015-05-21 MED ORDER — MORPHINE SULFATE (PF) 4 MG/ML IV SOLN
8.0000 mg | Freq: Once | INTRAVENOUS | Status: AC
  Administered 2015-05-21: 4 mg via INTRAVENOUS

## 2015-05-21 NOTE — Consult Note (Signed)
Asked to evaluate patient by Dr. Silverio LayYao. This is a 81 year old nursing home resident who collapsed this evening at the nursing home. CPR was initiated by staff. On EMS arrival, CPR was continued and the patient's initial rhythm is PEA. He transiently regained a pulse, but arrested again in route. He was again resuscitated and has been stabilized in the emergency department. I was asked to evaluate whether he would be a candidate for emergency cardiac catheterization.  All data reviewed. The patient is examined. Critical care is at the bedside. Considering this patient's baseline poor functional status is a nursing home resident, initial cardiac rhythm of PEA, and lack of ST segment changes on EKG (the patient does have a wide left bundle-branch but no ST deviation), I do not think he is appropriate to take to the cardiac catheterization lab. Will defer his care to the critical care medicine team. Discussed with CCM Fellow, Dr Silverio LayYao.   Tyler [name], Tyler [name] 06/09/15 8:54 PM

## 2015-05-21 NOTE — Progress Notes (Signed)
RT called for Post CPR. [facility] King replaced with 7.5 ETT. Secured @ 25 @ lip. Capnography verified. Equal bilateral breath sounds.   ABG results given to Dr. Donnamae JudeLo Verde.  Results for Tyler [name], Tyler L (MRN [id number]) as of 2015-01-22 20:08  Sample type ARTERIAL  pH, Arterial 7.259 (L)  pCO2 arterial 50.9 (H)  pO2, Arterial 523.0 (H)  Bicarbonate 23.0  TCO2 25  Acid-base deficit 4.0 (H)  O2 Saturation 100.0  Patient temperature 97.3 F  Collection site RADIAL, ALLEN'S T...   RT increased RR to 24 @ 20:10 @2027  Vent setting adjusted due to patient Auto Peeping (Set: 5cm H20 with total Peep= 12cmH20) & Peak/Plataeu Pressure 38/32 VT: decreased to 7cc=530 RR: Increased to 28 I Time: 0.7 HOB lowered to relieve pressure from lungs. Pt belly distended. Peak/Plataeu decreased to 29/26. Auto Peep remains 10cmH20.   2130: Dr. Donnamae JudeLo Verde stated he had switched patient to CPAP and patient showed no respiratory effort. Family is @ bedside at this time and orders have been entered for terminal extubation. Family is waiting for other family to arrive. RT will report when family is ready.

## 2015-05-21 NOTE — Progress Notes (Signed)
   February 11, 2015 2031  Clinical Encounter Type  Visited With Family  Visit Type ED  Referral From Nurse  Spiritual Encounters  Spiritual Needs Emotional;Other (Comment) (liaison with staff)  Stress Factors  Family Stress Factors Health changes;Lack of knowledge  Patient arrived in Tr [facility] without my being paged, but I took over care of family members in waiting room. Arrived approx. 1920. Two daughters and daughter's friend present, [place] them in [place] and escorted doctors to update them. Provided hospitality.

## 2015-05-21 NOTE — ED Notes (Signed)
Pt is from [facility]. Per EMS, facility staff heard the pt fall at 1806, went to check on the pt and found that he was pulseless and unresponsive. CPR started. EMS arrived and started CPR at 1820, and regained pulses at 1841. While in transit, EMS again lost pulses at 1904, and pt achieved ROSC at 1913. Pt with an IO to left tibia, and King airway in place. Pt received a total of 8 epi, and 4 of narcan. CBG for EMS was 150. Pt received 1 shock for a rhythm of v-fib. Pt placed on a backboard, and a towel was used in place of a c-collar. Pt was incontinent of bowel. Pt received approx 1500mL of Normal saline. Pt's abdomen is distended, and EMS states that it was distended upon their arrival to the facility, and become more distended throughout transport.

## 2015-05-21 NOTE — ED Notes (Signed)
Weak carotid pulse present with ultrasound.

## 2015-05-21 NOTE — ED Provider Notes (Signed)
CSN: [id number]     Arrival date & time 06-03-15  1925 History   None    Chief Complaint  Patient presents with  . Code STEMI  . Cardiac Arrest   HISTORY, ROS, AND PHYSICAL EXAM LIMITED BY CRITICAL CONDITION OF PATIENT   (Consider location/radiation/quality/duration/timing/severity/associated sxs/prior Treatment) Patient is a 81 y.o. male presenting with altered mental status. The history is provided by the EMS personnel. The history is limited by the condition of the patient.  Altered Mental Status Presenting symptoms: unresponsiveness   Severity:  Severe Most recent episode:  Today Episode history:  Single Duration:  35 minutes Timing:  Constant Progression:  Unchanged Chronicity:  New Context comment:  Spontaneous, LOC, fall Associated symptoms: difficulty breathing        Current past medical history, surgical history, and social history reviewed from patient's associated chart. Patient has a history significant for multiple medical conditions including CHF, atrial fibrillation, diabetes, CKD, surgical history including prior pacemaker placement. Patient is morbidly obese currently living at a nursing facility.   Social History  Substance Use Topics  . Smoking status: Not on file  . Smokeless tobacco: Not on file  . Alcohol Use: Not on file    Review of Systems  Unable to perform ROS: Patient unresponsive      Allergies  Neosporin and Polysporin  Home Medications   Prior to Admission medications   Medication Sig Start Date End Date Taking? Authorizing Provider  albuterol (PROVENTIL HFA;VENTOLIN HFA) 108 (90 BASE) MCG/ACT inhaler Inhale 2 puffs into the lungs every 4 (four) hours as needed for wheezing or shortness of breath.   Yes Historical Provider, MD  amiodarone (PACERONE) 200 MG tablet Take 200 mg by mouth daily.   Yes Historical Provider, MD  amitriptyline (ELAVIL) 25 MG tablet Take 25 mg by mouth at bedtime.   Yes Historical Provider, MD   atorvastatin (LIPITOR) 40 MG tablet Take 40 mg by mouth at bedtime.   Yes Historical Provider, MD  azelastine (OPTIVAR) 0.05 % ophthalmic solution Place 1 drop into both eyes 2 (two) times daily.   Yes Historical Provider, MD  bisacodyl (DULCOLAX) 5 MG EC tablet Take 5 mg by mouth 2 (two) times daily as needed for moderate constipation (Give on the days of enemas).   Yes Historical Provider, MD  docusate sodium (COLACE) 100 MG capsule Take 100 mg by mouth 2 (two) times daily.   Yes Historical Provider, MD  donepezil (ARICEPT) 10 MG tablet Take 10 mg by mouth at bedtime.   Yes Historical Provider, MD  doxazosin (CARDURA) 2 MG tablet Take 2 mg by mouth at bedtime.   Yes Historical Provider, MD  ferrous sulfate 325 (65 FE) MG tablet Take 325 mg by mouth daily with breakfast.   Yes Historical Provider, MD  fluticasone (FLONASE) 50 MCG/ACT nasal spray Place 2 sprays into both nostrils daily.   Yes Historical Provider, MD  Fluticasone-Salmeterol (ADVAIR) 100-50 MCG/DOSE AEPB Inhale 1 puff into the lungs 2 (two) times daily.   Yes Historical Provider, MD  gabapentin (NEURONTIN) 100 MG capsule Take 100 mg by mouth 3 (three) times daily.   Yes Historical Provider, MD  HYDROcodone-acetaminophen (NORCO/VICODIN) 5-325 MG tablet Take 1 tablet by mouth every 8 (eight) hours as needed (pain).   Yes Historical Provider, MD  insulin aspart (NOVOLOG) 100 UNIT/ML injection Inject 4-10 Units into the skin 3 (three) times daily with meals.   Yes Historical Provider, MD  insulin aspart (NOVOLOG) 100 UNIT/ML injection Inject 10  Units into the skin 2 (two) times daily with breakfast and lunch.   Yes Historical Provider, MD  insulin aspart (NOVOLOG) 100 UNIT/ML injection Inject 12 Units into the skin daily before supper.   Yes Historical Provider, MD  insulin glargine (LANTUS) 100 UNIT/ML injection Inject 40 Units into the skin 2 (two) times daily.   Yes Historical Provider, MD  loratadine (CLARITIN) 10 MG tablet Take 10 mg  by mouth daily.   Yes Historical Provider, MD  magnesium hydroxide (MILK OF MAGNESIA) 400 MG/5ML suspension Take 30 mLs by mouth as needed (constipation).   Yes Historical Provider, MD  meloxicam (MOBIC) 7.5 MG tablet Take 7.5 mg by mouth daily.   Yes Historical Provider, MD  metoCLOPramide (REGLAN) 5 MG tablet Take 5 mg by mouth at bedtime.   Yes Historical Provider, MD  metoprolol tartrate (LOPRESSOR) 25 MG tablet Take 37.5 mg by mouth 2 (two) times daily.   Yes Historical Provider, MD  mirabegron ER (MYRBETRIQ) 50 MG TB24 tablet Take 50 mg by mouth daily.   Yes Historical Provider, MD  montelukast (SINGULAIR) 10 MG tablet Take 10 mg by mouth at bedtime.   Yes Historical Provider, MD  Multiple Vitamin (MULTIVITAMIN WITH MINERALS) TABS tablet Take 1 tablet by mouth daily.   Yes Historical Provider, MD  omeprazole (PRILOSEC) 40 MG capsule Take 40 mg by mouth daily.   Yes Historical Provider, MD  polyethylene glycol (MIRALAX / GLYCOLAX) packet Take 17 g by mouth 2 (two) times daily.   Yes Historical Provider, MD  ranitidine (ZANTAC) 150 MG tablet Take 150 mg by mouth 2 (two) times daily.   Yes Historical Provider, MD  senna (SENOKOT) 8.6 MG TABS tablet Take 1 tablet by mouth at bedtime.   Yes Historical Provider, MD  simvastatin (ZOCOR) 40 MG tablet Take 40 mg by mouth daily at 6 PM.   Yes Historical Provider, MD  sodium phosphate (FLEET) enema Place 1 enema rectally 3 (three) times a week. follow package directions   Yes Historical Provider, MD  tamsulosin (FLOMAX) 0.4 MG CAPS capsule Take 0.4 mg by mouth daily.   Yes Historical Provider, MD  traMADol (ULTRAM) 50 MG tablet Take by mouth every 6 (six) hours as needed (pain).   Yes Historical Provider, MD  vitamin C (ASCORBIC ACID) 500 MG tablet Take 500 mg by mouth daily.   Yes Historical Provider, MD  Vitamin D, Ergocalciferol, (DRISDOL) 50000 UNITS CAPS capsule Take 50,000 Units by mouth every 7 (seven) days.   Yes Historical Provider, MD   warfarin (COUMADIN) 7.5 MG tablet Take 7.5 mg by mouth daily at 6 PM.   Yes Historical Provider, MD   BP 107/50 mmHg  Pulse 60  Temp(Src) 96.8 F (36.0 C)  Resp 20  Ht 5\' 9"  (1.753 m)  SpO2 100% Physical Exam  Constitutional: He appears well-developed and well-nourished. He appears distressed.  HENT:  Head: Normocephalic and atraumatic.  Right Ear: External ear normal.  Left Ear: External ear normal.  Nose: Nose normal.  Mouth/Throat: Oropharynx is clear and moist. No oropharyngeal exudate.  Eyes: Right eye exhibits no discharge. Left eye exhibits no discharge. No scleral icterus.  Neck: Normal range of motion. Neck supple. No JVD present. No tracheal deviation present. No thyromegaly present.  Cardiovascular: Normal rate, regular rhythm and intact distal pulses.  Exam reveals decreased pulses.   Pulmonary/Chest: Effort normal. No stridor. No respiratory distress.  Abdominal: Soft. He exhibits no distension.  Musculoskeletal: He exhibits no edema.  Lymphadenopathy:  He has no cervical adenopathy.  Neurological: He is unresponsive. He exhibits abnormal muscle tone. GCS eye subscore is 1. GCS verbal subscore is 1. GCS motor subscore is 1.  Skin: No rash noted. He is diaphoretic. No erythema. There is pallor.  Nursing note and vitals reviewed.   ED Course  .Intubation Date/Time:  7:30 PM Performed by: Gavin Pound Authorized by: Richardean Canal Consent: The procedure was performed in an emergent situation. Patient identity confirmed: provided demographic data Indications: respiratory failure Intubation method: video-assisted Patient status: unconscious Preoxygenation: BVM Laryngoscope size: Glidescope D. Tube size: 7.5 mm Tube type: cuffed Number of attempts: 1 Ventilation between attempts: BVM Cricoid pressure: yes Cords visualized: yes Post-procedure assessment: chest rise,  ETCO2 monitor and CO2 detector Breath sounds: absent over the epigastrium and  equal Cuff inflated: yes ETT to lip: 25 cm Tube secured with: ETT holder Chest x-ray interpreted by me. Chest x-ray findings: endotracheal tube in appropriate position Patient tolerance: Patient tolerated the procedure well with no immediate complications   (including critical care time) Labs Review Labs Reviewed  APTT - Abnormal; Notable for the following:    aPTT 62 (*)    All other components within normal limits  PROTIME-INR - Abnormal; Notable for the following:    Prothrombin Time 44.1 (*)    INR 4.87 (*)    All other components within normal limits  CBC WITH DIFFERENTIAL/PLATELET - Abnormal; Notable for the following:    RBC 3.58 (*)    Hemoglobin 10.1 (*)    HCT 32.7 (*)    All other components within normal limits  COMPREHENSIVE METABOLIC PANEL - Abnormal; Notable for the following:    CO2 20 (*)    Glucose, Bld 236 (*)    BUN 42 (*)    Creatinine, Ser 3.18 (*)    Calcium 8.1 (*)    Total Protein 6.4 (*)    Albumin 2.9 (*)    AST 60 (*)    GFR calc non Af Amer 17 (*)    GFR calc Af Amer 20 (*)    All other components within normal limits  I-STAT CHEM 8, ED - Abnormal; Notable for the following:    BUN 44 (*)    Creatinine, Ser 3.00 (*)    Glucose, Bld 232 (*)    Calcium, Ion 1.04 (*)    Hemoglobin 10.5 (*)    HCT 31.0 (*)    All other components within normal limits  I-STAT CG4 LACTIC ACID, ED - Abnormal; Notable for the following:    Lactic Acid, Venous 5.83 (*)    All other components within normal limits  I-STAT ARTERIAL BLOOD GAS, ED - Abnormal; Notable for the following:    pH, Arterial 7.259 (*)    pCO2 arterial 50.9 (*)    pO2, Arterial 523.0 (*)    Acid-base deficit 4.0 (*)    All other components within normal limits  CULTURE, BLOOD (ROUTINE X 2)  CULTURE, BLOOD (ROUTINE X 2)  I-STAT TROPOININ, ED  TYPE AND SCREEN  ABO/RH    Imaging Review Dg Chest Portable 1 View    CLINICAL DATA:  Hypoxia EXAM: PORTABLE CHEST 1 VIEW  COMPARISON:  None. FINDINGS: Endotracheal tube tip is 4.4 cm above the carina. Nasogastric tube tip and side port below the diaphragm. Pacemaker leads are attached to the right atrium and right ventricle. No pneumothorax. There is no edema or consolidation. Heart is mildly enlarged with pulmonary vascularity within normal limits. No adenopathy. No  bone lesions. IMPRESSION: Tube positions as described without pneumothorax. No edema or consolidation. Mild cardiomegaly. Electronically Signed   By: Bretta BangWilliam  Woodruff III M.D.   On: 01-16-2015 19:58   Dg Abd Portable 1v  01-16-2015  CLINICAL DATA:  Unresponsive, CPR administered, NG tube placement EXAM: PORTABLE ABDOMEN - 1 VIEW COMPARISON:  None. FINDINGS: Enteric tube terminates in the proximal gastric body. Nonobstructive bowel gas pattern. IMPRESSION: Enteric tube terminates in the proximal gastric body. Electronically Signed   By: Charline BillsSriyesh  Krishnan M.D.   On: 01-16-2015 19:58   I have personally reviewed and evaluated these images and lab results as part of my medical decision-making.   EKG Interpretation   Date/Time:  Friday May 21 2015 19:40:15 EDT Ventricular Rate:  60 PR Interval:    QRS Duration: 186 QT Interval:  498 QTC Calculation: 498 R Axis:   -65 Text Interpretation:  Junctional rhythm Left bundle branch block No  previous ECGs available Confirmed by YAO  MD, DAVID ([id number]) on 01-16-2015  7:46:15 PM      MDM   Final diagnoses:  Cardiac arrest [facility](HCC)   Patient presented as a possible STEMI. Status post cardiac arrest. Patient reportedly fell at nursing facility where he resides. Staff heard him fall and when they came to evaluate him he was pulseless and apneic. CPR was initiated. Per EMS report approximately 6-7 minutes later they arrived and continued CPR. Patient initially in PEA. Patient coded for 20-21 minutes. At one point had V. fib which was shocked. Difficult to tell V. fib versus PEA at this point due to the fact  that patient has a pacemaker. Patient obtained ROSC for 4-5 minutes. Subsequently went into PEA again for about 5 minutes. Then ROSC obtained again. EMS obtained a twelve-lead which showed possible ST elevations in inferior leads. Code STEMI was initiated. [facility] tube without need for sedation or paralytics. He was pulsatile but required mechanical ventilation. Patient's blood pressure improved following gastric decompression and intubation. No additional pressor requirement at this time. Patient remained unresponsive. Patient's condition was discussed with family. His daughters stated that they wanted to place DO NOT RESUSCITATE order knowing he had multiple chronic medical conditions and would not want to be on life sustaining treatment if he had a poor prognosis, and little chance of meaningful recovery. Based on the duration of time the patient was receiving CPR also the fact that he required 8 doses of epinephrine are all poor prognostic indicators in his case. Patient was discussed with critical care medicine who also had prognostic discussion with patient's family. While this was taking place patient began having seizure-like activity, concerning for hypoxic brain injury. Patient also had suppressed brainstem reflexes, and no spontaneous respiratory effort on ventilation. Patient's family decided to transition to comfort care measures only, and proceed with compassionate extubation. Additional workup studies were discontinued at this point. Patient does have numerous metabolic abnormalities on laboratory workup obtained thus far as a result of his cardiac arrest.  Pt has hx of PPM placement.  Following decision to transition to comfort care by patient's family.  I confirmed with Cardiology that the patient has a pacemaker and not an ICD based on records and Chest XR.  No need for ICD deactivation in the setting of expectant management.  Pt started on low dose  morphine gtt to manage dyspnea, with robinul PRN for secretions.  Pt unresponsive and does not appear to need any anxiolytic medications  presently.  Family at bedside and appreciative of efforts of care team.  Pt discussed with family medicine for admission, as he was d/c from their service on 9/28 during a prior admission under a different MR number.  Patient care was discussed with my attending, Dr. Silverio Lay.   Gavin Pound, MD  [id number]  Richardean Canal, MD  [phone]

## 2015-05-21 NOTE — ED Notes (Addendum)
Seizure-like activity noted in pt. Pt's eyes open, temporal muscles spasming, jaw clenched. Loverde, MD at bedside. No corneal reflexes or gag reflexes noted.

## 2015-05-21 NOTE — Telephone Encounter (Signed)
Increase torsemide from 50 mg daily to 75 mg daily x 3 days then back to 50 mg daily after that.  See if he can be followed up sooner.

## 2015-05-21 NOTE — Progress Notes (Signed)
RT called to extubate patient. Pt extubated and 2L O2 placed on patient. Family at bedside. RT will monitor

## 2015-05-21 NOTE — Consult Note (Signed)
Name: Tyler [name] MRN: [id number] DOB: 09-30-1933    ADMISSION DATE:  06/01/2015 CONSULTATION DATE:  2015-06-01  REFERRING MD :  Dr. Silverio Lay  CHIEF COMPLAINT:  PEA arrest  HISTORY OF PRESENT ILLNESS:   81AAM with CHF suffered PEA arrest x2 at SNF today.  Per report from family and EMS the patient was complaining of significant SOB and DOE for the last several days until today when the SNF staff heard him fall in his room.  The promptly entered and found him to be pulseless.  CPR was started immediately and epi x2 was administered, after 15 min ROSC was achieved.  EMS arrived and on transport the patient lost pulses again.  CPR was performed for 5 min and ROSC again achieved.    In the ED he had his king airway exchanged for an ETT. He required no sedation for intubation and no sedatives were administered.  PAST MEDICAL HISTORY :   has no past medical history on file.  has no past surgical history on file. Prior to Admission medications   Not on File   Allergies not on file  FAMILY HISTORY:  family history is not on file. SOCIAL HISTORY:    REVIEW OF SYSTEMS:  (obtained from family) Constitutional: Negative for fever, chills, weight loss, malaise/fatigue and diaphoresis.  HENT: Negative for hearing loss, ear pain, nosebleeds, congestion, sore throat, neck pain, tinnitus and ear discharge.   Eyes: Negative for blurred vision, double vision, photophobia, pain, discharge and redness.  Respiratory: + SOB and DOE.   Cardiovascular: Negative for chest pain, palpitations, orthopnea, claudication, leg swelling and PND.  Gastrointestinal: Negative for heartburn, nausea, vomiting, abdominal pain, diarrhea, constipation, blood in stool and melena.  Genitourinary: Negative for dysuria, urgency, frequency, hematuria and flank pain.  Musculoskeletal: Negative for myalgias, back pain, joint pain and falls.  Skin: Negative for itching and rash.  Neurological: Negative for dizziness,  tingling, tremors, sensory change, speech change, focal weakness, seizures, loss of consciousness, weakness and headaches.  Endo/Heme/Allergies: Negative for environmental allergies and polydipsia. Does not bruise/bleed easily.  SUBJECTIVE:   VITAL SIGNS: Temp:  [96.6 F (35.9 C)-96.8 F (36 C)] 96.8 F (36 C) (10/28 2045) Pulse Rate:  [58-60] 60 (10/28 2045) Resp:  [9-28] 9 (10/28 2045) BP: (107-142)/(49-124) 131/53 mmHg (10/28 2045) SpO2:  [90 %-100 %] 100 % (10/28 2045) FiO2 (%):  [100 %] 100 % (10/28 1930)  PHYSICAL EXAMINATION: General:  Obtunded, intubated Neuro:  No purposeful movement.  + myoclonic jerks.  No withdrawal to pain, no gag or corneal reflex, no respiratory effort on PS/CPAP.   HEENT:  ETT in place.  Pupils 2mm and non reactive Cardiovascular:  RRR, no m/r/g.  2+ Pitting edema b/l LE Lungs:  CTA, mechanical breath sounds, no w/r/r Abdomen:  Massive distention, no bowel sounds, tympany to percussion Musculoskeletal:  Norma bulk and tone Skin:  Venous stasis dermatitis b/l LE   Recent Labs Lab June 01, 2015 1952 2015/06/01 2001  NA 141 139  K 4.9 4.7  CL 108 106  CO2 20*  --   BUN 42* 44*  CREATININE 3.18* 3.00*  GLUCOSE 236* 232*    Recent Labs Lab 2015-06-01 1952 06/01/2015 2001  HGB 10.1* 10.5*  HCT 32.7* 31.0*  WBC 7.4  --   PLT 156  --    Dg Chest Portable 1 View  June 01, 2015  CLINICAL DATA:  Hypoxia EXAM: PORTABLE CHEST 1 VIEW COMPARISON:  None. FINDINGS: Endotracheal tube tip is 4.4 cm above the  carina. Nasogastric tube tip and side port below the diaphragm. Pacemaker leads are attached to the right atrium and right ventricle. No pneumothorax. There is no edema or consolidation. Heart is mildly enlarged with pulmonary vascularity within normal limits. No adenopathy. No bone lesions. IMPRESSION: Tube positions as described without pneumothorax. No edema or consolidation. Mild cardiomegaly. Electronically Signed   By: Bretta BangWilliam  Woodruff III M.D.   On:  06/20/2015 19:58   Dg Abd Portable 1v  06/20/2015  CLINICAL DATA:  Unresponsive, CPR administered, NG tube placement EXAM: PORTABLE ABDOMEN - 1 VIEW COMPARISON:  None. FINDINGS: Enteric tube terminates in the proximal gastric body. Nonobstructive bowel gas pattern. IMPRESSION: Enteric tube terminates in the proximal gastric body. Electronically Signed   By: Charline BillsSriyesh  Krishnan M.D.   On: 06/20/2015 19:58   Bedside FAST SCAN:  Negative BEDSIDE TTE:  - No pericardial effusion - LVEF 25-35% no focal wall motion abnormalities. -  IVC 2.5cm not collapsable  ASSESSMENT / PLAN:  81AAM with PEA arrest x2 within 1 hour.  The patient demonstrated a catastrophic neurologic exam during his course in the ED, this was noted by the ICU physician (myself) as well as the ED physician.  Bedside ultrasound, CXR, laboratory values demonstrated no clearly reversible cause for patients PEA.  The ED physician and myself explained to the family that given that he suffered two arrests within 12 hrs and is current neurologic exam, his prognosis was extremely bleak.  After reporting this information to his daughters they stated that under these circumstances aggressive treatment was not consistent with the patient's goals of care.  They stated that they wish to pursue comfort measures only, with compassionate extubation.  The ED attending stated they would keep the patient in the ED on their service to carry out emergent palliative care measures including the withdrawal of artificial support including compassionate extubation.  The ICU staff agrees with this assessment and plan.   #) Neurologic collapse #) Acute hypoxemic and hypercapnic respiratory failure #) Anoxic brain injury #) AKI #) Comfort measures to be instituted by ED staff  Total critical care time: 60 min  Critical care time was exclusive of separately billable procedures and treating other patients.  Critical care was necessary to treat or prevent  imminent or life-threatening deterioration.  Critical care was time spent personally by me on the following activities: development of treatment plan with patient and/or surrogate as well as nursing, discussions with consultants, evaluation of patient's response to treatment, examination of patient, obtaining history from patient or surrogate, ordering and performing treatments and interventions, ordering and review of laboratory studies, ordering and review of radiographic studies, pulse oximetry and re-evaluation of patient's condition.   Galvin Profferaniel Verdine Grenfell, DO., MS [facility]    Pulmonary and Critical Care Medicine [facility] Pager: [phone]  06/20/2015, 8:58 PM

## 2015-05-21 NOTE — Telephone Encounter (Signed)
Order to increase torsemide from 50 mg to 75 mg daily x 3 days then back to 50 mg daily after that [place] [facility] [place] medication technicians [phone]) Daughter also notified of the above and told her to call and let us know how he is doing Monday

## 2015-05-21 NOTE — ED Notes (Addendum)
NO COMPRESSIONS PER FAMILY. VERIFIED WITH LOVERDE, MD.

## 2015-05-21 NOTE — ED Notes (Addendum)
Family at bedside. Kandis CockingLeverde, MD explaining the pt's situation and plan of care with family.

## 2015-05-21 NOTE — Telephone Encounter (Signed)
Daughter called reporting that father (patient) seems more winded than normal.  Is at an assisted living. Medication technicians thinks he is a little more short of breath. No daily weight Daughter would like to know if they could give him a medication to help head off any possible fluid accumulation Next OV 11/2  Daughters phone number [phone])

## 2015-05-21 NOTE — ED Notes (Signed)
Carotid pulses found present with ultrasound. Weak to absent femoral pulses.

## 2015-05-22 DIAGNOSIS — I469 Cardiac arrest, cause unspecified: Secondary | ICD-10-CM | POA: Diagnosis present

## 2015-05-22 DIAGNOSIS — Z66 Do not resuscitate: Secondary | ICD-10-CM | POA: Diagnosis present

## 2015-05-22 DIAGNOSIS — N189 Chronic kidney disease, unspecified: Secondary | ICD-10-CM | POA: Diagnosis present

## 2015-05-22 DIAGNOSIS — G931 Anoxic brain damage, not elsewhere classified: Secondary | ICD-10-CM | POA: Diagnosis present

## 2015-05-22 DIAGNOSIS — E872 Acidosis: Secondary | ICD-10-CM | POA: Diagnosis present

## 2015-05-22 DIAGNOSIS — J9602 Acute respiratory failure with hypercapnia: Secondary | ICD-10-CM | POA: Diagnosis present

## 2015-05-22 DIAGNOSIS — Z7901 Long term (current) use of anticoagulants: Secondary | ICD-10-CM | POA: Diagnosis not present

## 2015-05-22 DIAGNOSIS — I110 Hypertensive heart disease with heart failure: Secondary | ICD-10-CM | POA: Insufficient documentation

## 2015-05-22 DIAGNOSIS — N179 Acute kidney failure, unspecified: Secondary | ICD-10-CM | POA: Diagnosis present

## 2015-05-22 DIAGNOSIS — I462 Cardiac arrest due to underlying cardiac condition: Secondary | ICD-10-CM | POA: Diagnosis present

## 2015-05-22 DIAGNOSIS — I4891 Unspecified atrial fibrillation: Secondary | ICD-10-CM | POA: Diagnosis present

## 2015-05-22 DIAGNOSIS — Z950 Presence of cardiac pacemaker: Secondary | ICD-10-CM | POA: Diagnosis not present

## 2015-05-22 DIAGNOSIS — Z794 Long term (current) use of insulin: Secondary | ICD-10-CM | POA: Diagnosis not present

## 2015-05-22 DIAGNOSIS — I447 Left bundle-branch block, unspecified: Secondary | ICD-10-CM | POA: Diagnosis present

## 2015-05-22 DIAGNOSIS — J9601 Acute respiratory failure with hypoxia: Secondary | ICD-10-CM | POA: Diagnosis present

## 2015-05-22 DIAGNOSIS — E1122 Type 2 diabetes mellitus with diabetic chronic kidney disease: Secondary | ICD-10-CM | POA: Diagnosis present

## 2015-05-22 DIAGNOSIS — Z515 Encounter for palliative care: Secondary | ICD-10-CM | POA: Diagnosis present

## 2015-05-22 DIAGNOSIS — I130 Hypertensive heart and chronic kidney disease with heart failure and stage 1 through stage 4 chronic kidney disease, or unspecified chronic kidney disease: Secondary | ICD-10-CM | POA: Diagnosis present

## 2015-05-22 DIAGNOSIS — J449 Chronic obstructive pulmonary disease, unspecified: Secondary | ICD-10-CM | POA: Diagnosis present

## 2015-05-22 DIAGNOSIS — I4901 Ventricular fibrillation: Secondary | ICD-10-CM | POA: Diagnosis present

## 2015-05-22 DIAGNOSIS — I5033 Acute on chronic diastolic (congestive) heart failure: Secondary | ICD-10-CM | POA: Diagnosis present

## 2015-05-22 DIAGNOSIS — Z6841 Body Mass Index (BMI) 40.0 and over, adult: Secondary | ICD-10-CM | POA: Diagnosis not present

## 2015-05-22 MED ORDER — GLYCOPYRROLATE 0.2 MG/ML IJ SOLN
0.2000 mg | INTRAMUSCULAR | Status: DC | PRN
  Administered 2015-05-22: 0.2 mg via INTRAVENOUS
  Filled 2015-05-22 (×2): qty 1

## 2015-05-22 MED ORDER — CETYLPYRIDINIUM CHLORIDE 0.05 % MT LIQD: 7.0000 mL | Freq: Two times a day (BID) | OROMUCOSAL | Status: DC

## 2015-05-22 NOTE — Progress Notes (Signed)
Called ER RN for report. Room is ready.  

## 2015-05-22 NOTE — H&P (Signed)
Family Medicine [facility] Admission History and Physical Service Pager: [phone]  Patient name: Tyler [name] Medical record number: [id number] Date of birth: 04/19/1934 Age: 81 y.o. Gender: male  Primary Care Provider: Eartha InchBADGER,MICHAEL C, MD Consultants: None  Code Status: DNR/DNI  Chief Complaint:  GSC3 s/p cardiac arrest   Assessment and Plan: Tyler [name] is a 81 y.o. male presenting with s/p Cardiac arrest  (recieving only comfort care). PMH is significant for COPD, CKD AF, Oglivies syndrome, T2DM, HFrEF, Pacemaker, Sick sinus syndrome, HLD, HTN   S/p Cardiac Arrest: GCS 3, more than 20 minutes of CPR on this patient. Per family patient should remain DNR/DNI and should receive only comfort care. - Morphine infusion,  Titrated for O2 hunger - Robinul to clear secretions - No lab draws  - Continue comfort care, consult pallatitive tomorrow   COPD, CKD AF, Oglivies syndrome, T2DM, HFrEF, Pacemaker, Sick sinus syndrome, HLD, HTN  - Holding all meds as patient is comfort care   FEN/GI: None Prophylaxis: Comfort Care   Disposition: Med-surg   History of Present Illness:  Tyler [name] is a 81 y.o. male presenting with post cardiac arrest with a GCS of 3.  Per ED note, patient presented as a possible STEMI.  Patient was coded by EMS for approximately 6-7 minutes. Patient initially in PEA on arrival to the ED. Patient coded for 20-21 minutes. Patient obtained ROSC for 4-5 minutes. Subsequently went into PEA again for about 5 minutes. Then ROSC obtained again. A twelve-lead which showed possible ST elevations in inferior leads. Code STEMI was initiated. An ET tube was placed.  Patient remained unresponsive. Patient's condition was discussed with family. His daughters stated that they wanted to place DO NOT RESUSCITATE order knowing he had multiple chronic medical conditions and would not want to be on life sustaining treatment if he had a poor prognosis, and  little chance of meaningful recovery. Patient's family decided to transition to comfort care measures only, and proceed with compassionate extubation. Additional workup studies were discontinued at this point. Patient does have numerous metabolic abnormalities on laboratory workup obtained thus far as a result of his cardiac arrest. Pt started on low dose morphine gtt to manage dyspnea, with robinul PRN for secretions.  Review Of Systems: Per HPI with the following additions: None Otherwise the remainder of the systems were negative.  Patient Active Problem List   Diagnosis Date Noted  . Cardiac arrest (HCC)     Past Medical History: COPD, CKD AF, Oglivies syndrome, T2DM, HFrEF, Pacemaker, Sick sinus syndrome, HLD, HTN  Past Surgical History: Pacemaker placement  Social History: Social History  Substance Use Topics  . Smoking status: Not on file  . Smokeless tobacco: Not on file  . Alcohol Use: Not on file   Additional social history: none Please also refer to relevant sections of EMR.  Family History: Father/Brother: HTN  Allergies and Medications: Allergies  Allergen Reactions  . Neosporin [Neomycin-Polymyxin-Gramicidin]   . Polysporin [Bacitracin-Polymyxin B]    No current facility-administered medications on file prior to encounter.   No current outpatient prescriptions on file prior to encounter.    Objective: BP 149/72 mmHg  Pulse 59  Temp(Src) 96.3 F (35.7 C)  Resp 38  Ht 5\' 9"  (1.753 m)  SpO2 100% Exam: General: Obese man, lying in bed in NAD  Eyes: Dolly eyes, pupils non-reactive and pinpoint, fixed  ENTM:Moist mucosa membranes  Neck: No lymphadenopathy  Cardiovascular: Heart sounds are distant and pulses thread  Respiratory: Very coarse, rhonchi, lungs sounds coarse throughout.  Abdomen: BS+, no ttp, no rebounding  MSK: No lower extremity edema  Skin: no rashes or color changes Neuro: GSC 3  Labs and Imaging: CBC BMET   Recent  Labs Lab 2015-06-01 1952 June 01, 2015 2001  WBC 7.4  --   HGB 10.1* 10.5*  HCT 32.7* 31.0*  PLT 156  --     Recent Labs Lab 2015/06/01 1952 06-01-15 2001  NA 141 139  K 4.9 4.7  CL 108 106  CO2 20*  --   BUN 42* 44*  CREATININE 3.18* 3.00*  GLUCOSE 236* 232*  CALCIUM 8.1*  --       Tyler Mayra Reel, MD , 2:03 AM PGY-1, [facility] FPTS Intern pager: [phone], text pages welcome    Upper Level Addendum:  I have seen and evaluated this patient along with Dr. Cathlean Cower and reviewed the above note, making necessary revisions in red.   Kathee Delton, MD,MS,  PGY2  3:23 AM

## 2015-05-22 NOTE — Discharge Summary (Signed)
Family Medicine Teaching [facility] Summary - Deceased Patient  Patient name: Tyler [name] Medical record number: [id number] Date of birth: 1933/12/02 Age: 81 y.o. Gender: male Date of Admission: 05/24/2015  Date of Discharge:   Admitting Physician: Leighton Roach McDiarmid, MD  Primary Care Provider: [name], MD Consultants: Palliative Care  Indication for Hospitalization: Cardiac Arrest  Discharge Diagnoses/Problem List:  Deceased   Disposition: Morgue   Discharge Condition: Deceased  Discharge Exam:  General: Obese man, motionless  Eyes: pupils non-reactive to light Cardiovascular: Absent heart sounds Respiratory: No respiratory effort/sounds Skin: Cool to the touch  Brief Hospital Course:  Patient presented to the ED by EMS for a possible STEMI. Patient was coded by EMS and was found to be in PDA upon arrival to the ED. After intubation and multiple rounds of resuscitation patient's status was discussed with his family. Patient's prognosis was extremely poor at that time and family decided to place a DO NOT RESUSCITATE/DO NOT INTUBATE status on the patient. Comfort care only was initiated at that time. In our team was called to admit the patient to continue comfort care.  Morphine drip was initiated in the ED and continued while on the floor. Orders are placed to titrate dosage up to control air hunger. At the time of transfer from the ED to the floor this titration was held at 7 mg/h. Patient appeared comfortable at that time and nursing was encouraged to leave patient at that dosage. Overnight this titration was increased likely due to air hunger. This was then ordered to be decreased gradually by the morning team. Discussion about patient's comfort was had that morning with the patient's family. All questions appeared to be answered to the family's liking at that time. Patient passed away ~1hr after with family at bedside.  Significant Procedures:  None  Significant Labs and Imaging:   Recent Labs Lab 05-24-15 1952 05-24-2015 2001  WBC 7.4  --   HGB 10.1* 10.5*  HCT 32.7* 31.0*  PLT 156  --     Recent Labs Lab 24-May-2015 1952 2015/05/24 2001  NA 141 139  K 4.9 4.7  CL 108 106  CO2 20*  --   GLUCOSE 236* 232*  BUN 42* 44*  CREATININE 3.18* 3.00*  CALCIUM 8.1*  --   ALKPHOS 109  --   AST 60*  --   ALT 49  --   ALBUMIN 2.9*  --     Discharge Medications:    Medication List    STOP taking these medications        albuterol 108 (90 BASE) MCG/ACT inhaler  Commonly known as:  PROVENTIL HFA;VENTOLIN HFA     amiodarone 200 MG tablet  Commonly known as:  PACERONE     amitriptyline 25 MG tablet  Commonly known as:  ELAVIL     atorvastatin 40 MG tablet  Commonly known as:  LIPITOR     azelastine 0.05 % ophthalmic solution  Commonly known as:  OPTIVAR     bisacodyl 5 MG EC tablet  Commonly known as:  DULCOLAX     docusate sodium 100 MG capsule  Commonly known as:  COLACE     donepezil 10 MG tablet  Commonly known as:  ARICEPT     doxazosin 2 MG tablet  Commonly known as:  CARDURA     ferrous sulfate 325 (65 FE) MG tablet     fluticasone 50 MCG/ACT nasal spray  Commonly known as:  FLONASE  Fluticasone-Salmeterol 100-50 MCG/DOSE Aepb  Commonly known as:  ADVAIR     gabapentin 100 MG capsule  Commonly known as:  NEURONTIN     HYDROcodone-acetaminophen 5-325 MG tablet  Commonly known as:  NORCO/VICODIN     insulin aspart 100 UNIT/ML injection  Commonly known as:  novoLOG     insulin glargine 100 UNIT/ML injection  Commonly known as:  LANTUS     loratadine 10 MG tablet  Commonly known as:  CLARITIN     magnesium hydroxide 400 MG/5ML suspension  Commonly known as:  MILK OF MAGNESIA     meloxicam 7.5 MG tablet  Commonly known as:  MOBIC     metoCLOPramide 5 MG tablet  Commonly known as:  REGLAN     metoprolol tartrate 25 MG tablet  Commonly known as:  LOPRESSOR     montelukast 10 MG  tablet  Commonly known as:  SINGULAIR     multivitamin with minerals Tabs tablet     MYRBETRIQ 50 MG Tb24 tablet  Generic drug:  mirabegron ER     omeprazole 40 MG capsule  Commonly known as:  PRILOSEC     polyethylene glycol packet  Commonly known as:  MIRALAX / GLYCOLAX     ranitidine 150 MG tablet  Commonly known as:  ZANTAC     senna 8.6 MG Tabs tablet  Commonly known as:  SENOKOT     simvastatin 40 MG tablet  Commonly known as:  ZOCOR     sodium phosphate enema  Commonly known as:  FLEET     tamsulosin 0.4 MG Caps capsule  Commonly known as:  FLOMAX     traMADol 50 MG tablet  Commonly known as:  ULTRAM     vitamin C 500 MG tablet  Commonly known as:  ASCORBIC ACID     Vitamin D (Ergocalciferol) 50000 UNITS Caps capsule  Commonly known as:  DRISDOL     warfarin 7.5 MG tablet  Commonly known as:  COUMADIN         Kathee DeltonIan D McKeag, MD , 12:30 PM PGY-2, [facility]

## 2015-05-22 NOTE — ED Notes (Signed)
Admitting at bedside 

## 2015-05-22 NOTE — Progress Notes (Signed)
Floor staff notified me that Mr. Gemmill had passed prior to my entering the room.   I stopped Jenelle Magesin briefly to check on family and see if there is anything else they need at this time. Several family members were present in the room and denied any needs at this time.  I offered my condolences on their loss and left my card in case there is any further assistance our team can provide.   Romie MinusGene Autumne Kallio, MD [facility] Team [phone]

## 2015-05-25 DEATH — deceased

## 2015-05-26 ENCOUNTER — Encounter (HOSPITAL_COMMUNITY): Payer: Medicare Other

## 2015-05-26 LAB — CULTURE, BLOOD (ROUTINE X 2)
CULTURE: NO GROWTH
Culture: NO GROWTH

## 2015-05-31 ENCOUNTER — Ambulatory Visit: Payer: Medicare Other | Admitting: Podiatry

## 2015-07-05 ENCOUNTER — Encounter: Payer: Medicare Other | Admitting: Nurse Practitioner

## 2015-10-29 IMAGING — DX DG ABDOMEN 2V
3 series · 3 of 3 positions shown · non-contrast
Comparison: 04/16/2015

CLINICAL DATA: Abdominal distension. Mid epigastric abdominal pain.
Bowel movement last night.

EXAM:
ABDOMEN - 2 VIEW

[w abdomen decub]
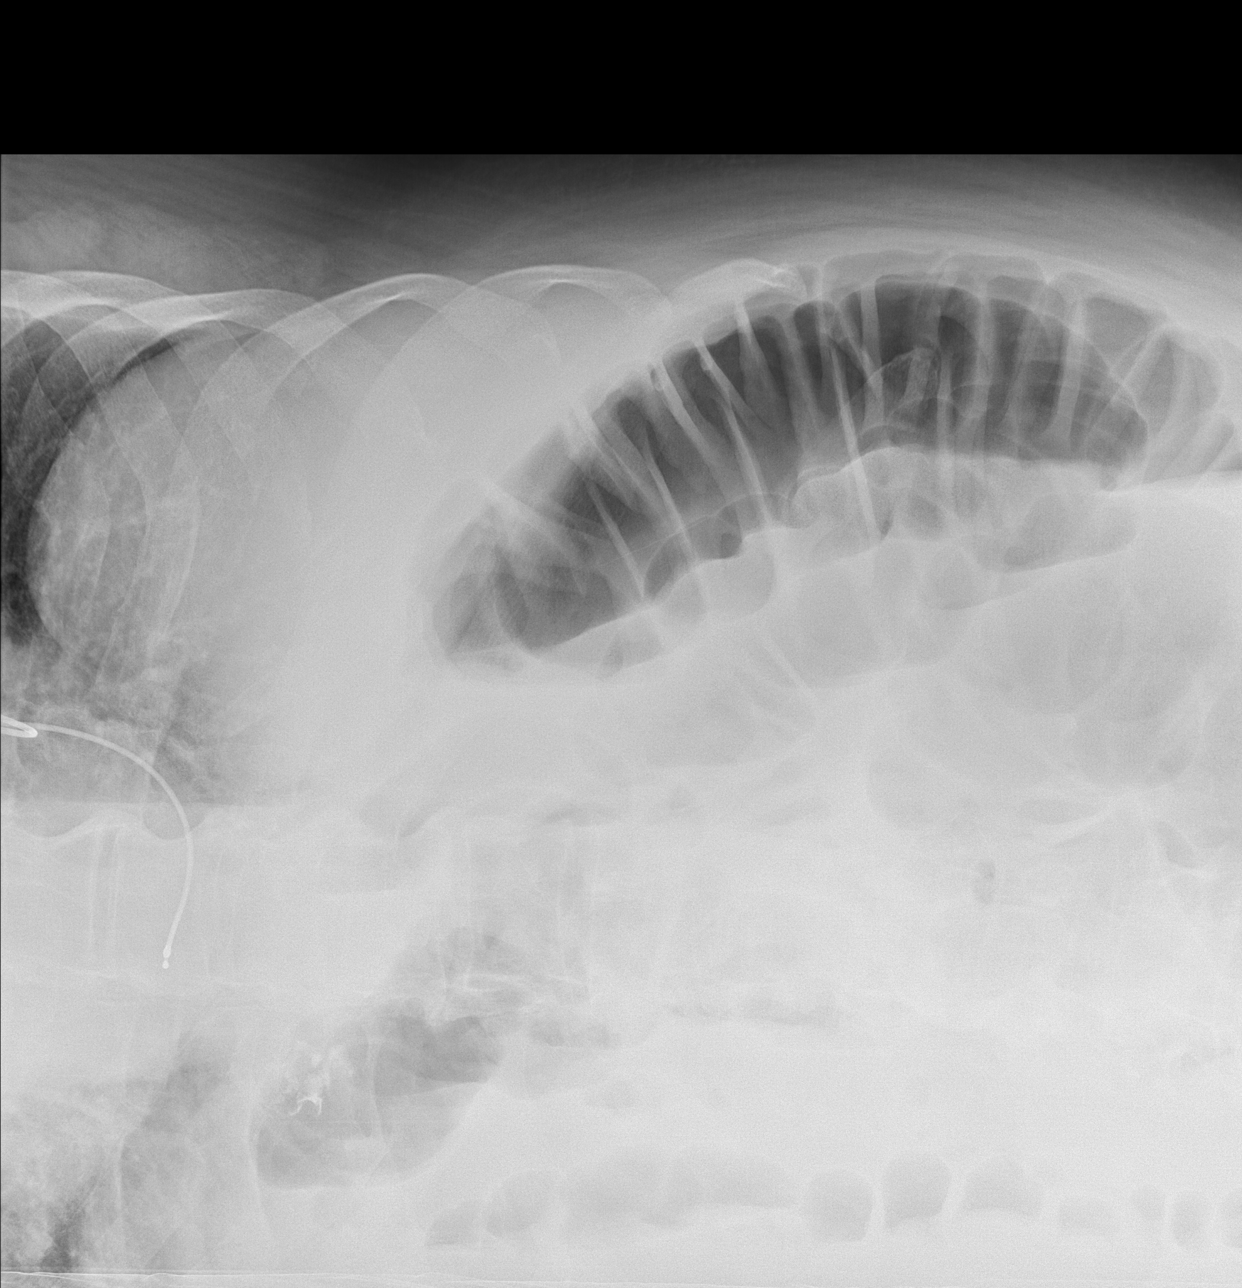

[t abdomen supine (1 of 2)]
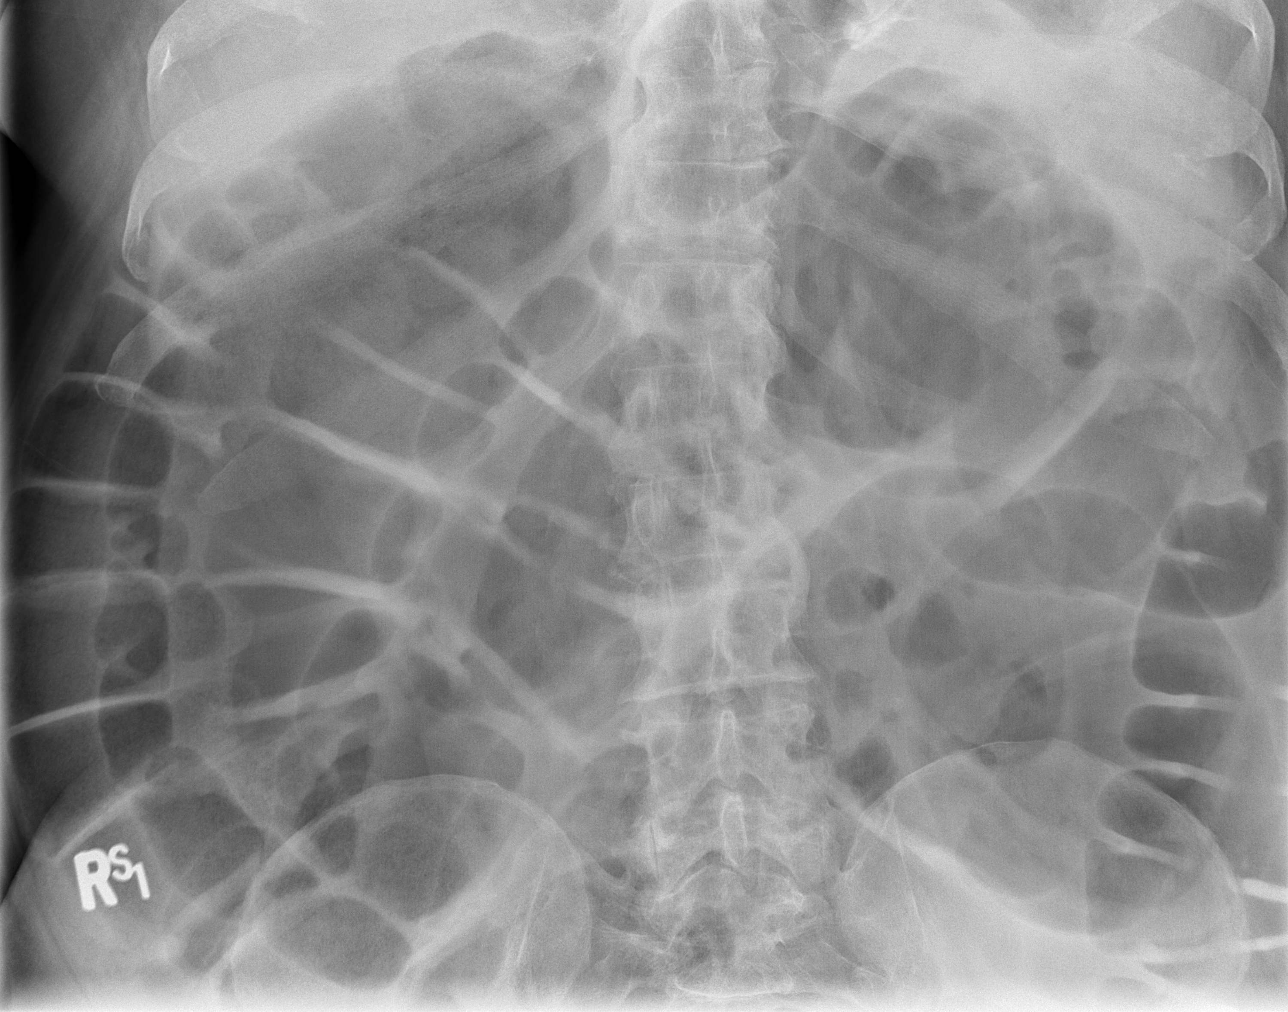

[t abdomen supine (2 of 2)]
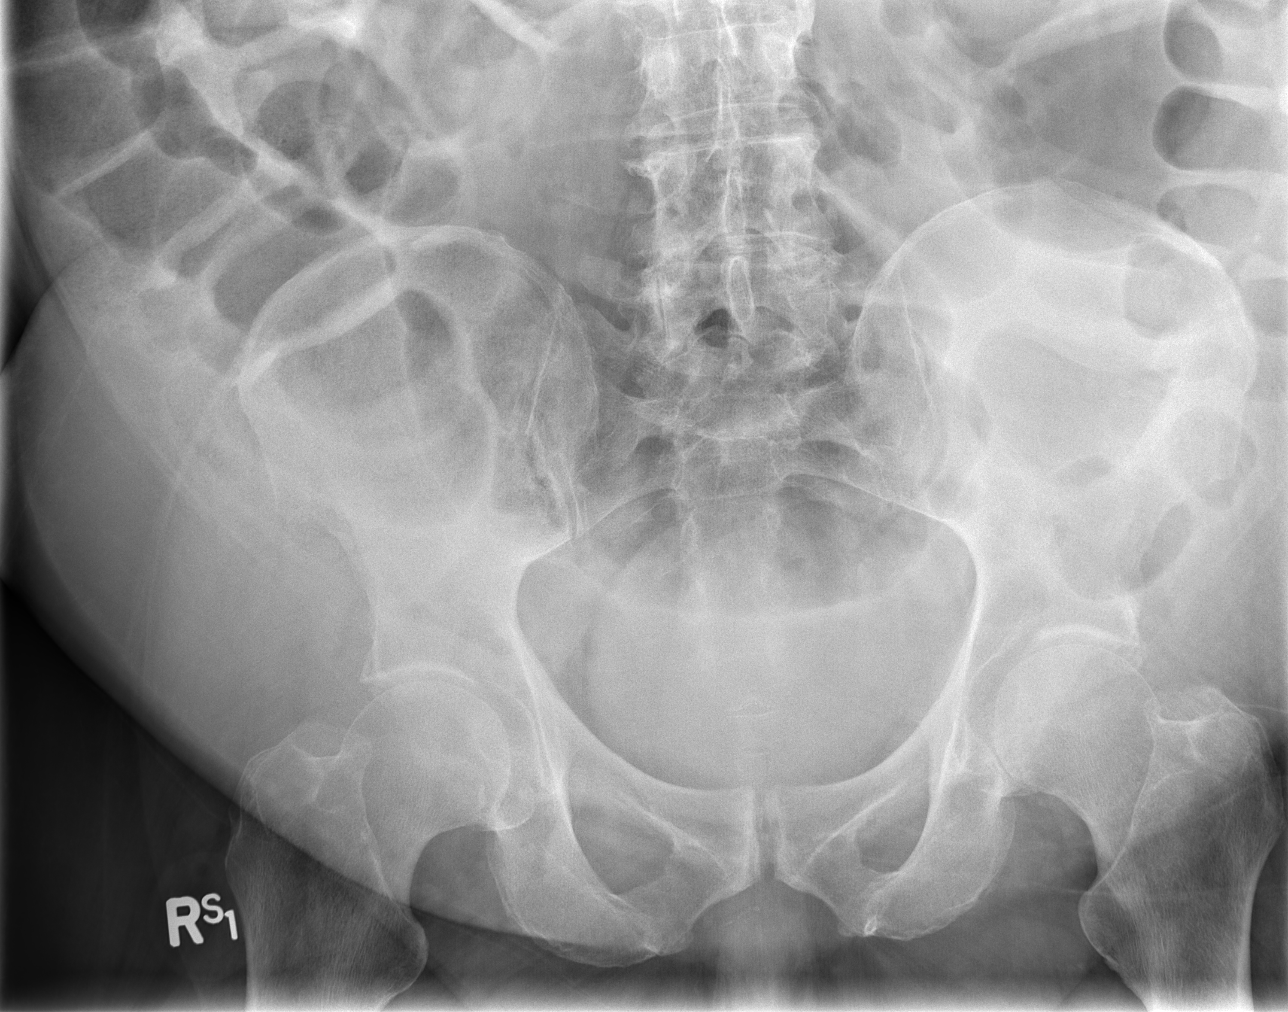

[3 of 3 positions shown; findings below may reference images not displayed]

FINDINGS: No intraperitoneal free air is identified. Diffuse, moderate gaseous
distension of the colon is similar to the prior study. Gas is
present in some grossly nondilated loops of small bowel. No acute
osseous abnormality.
IMPRESSION: Unchanged diffuse gaseous distention of the colon suggestive of
ileus. No definite small bowel dilatation.

## 2015-12-02 IMAGING — CR DG CHEST 1V PORT
1 series · 1 of 1 positions shown · non-contrast
Comparison: None.

CLINICAL DATA: Hypoxia

EXAM:
PORTABLE CHEST 1 VIEW

[AP]
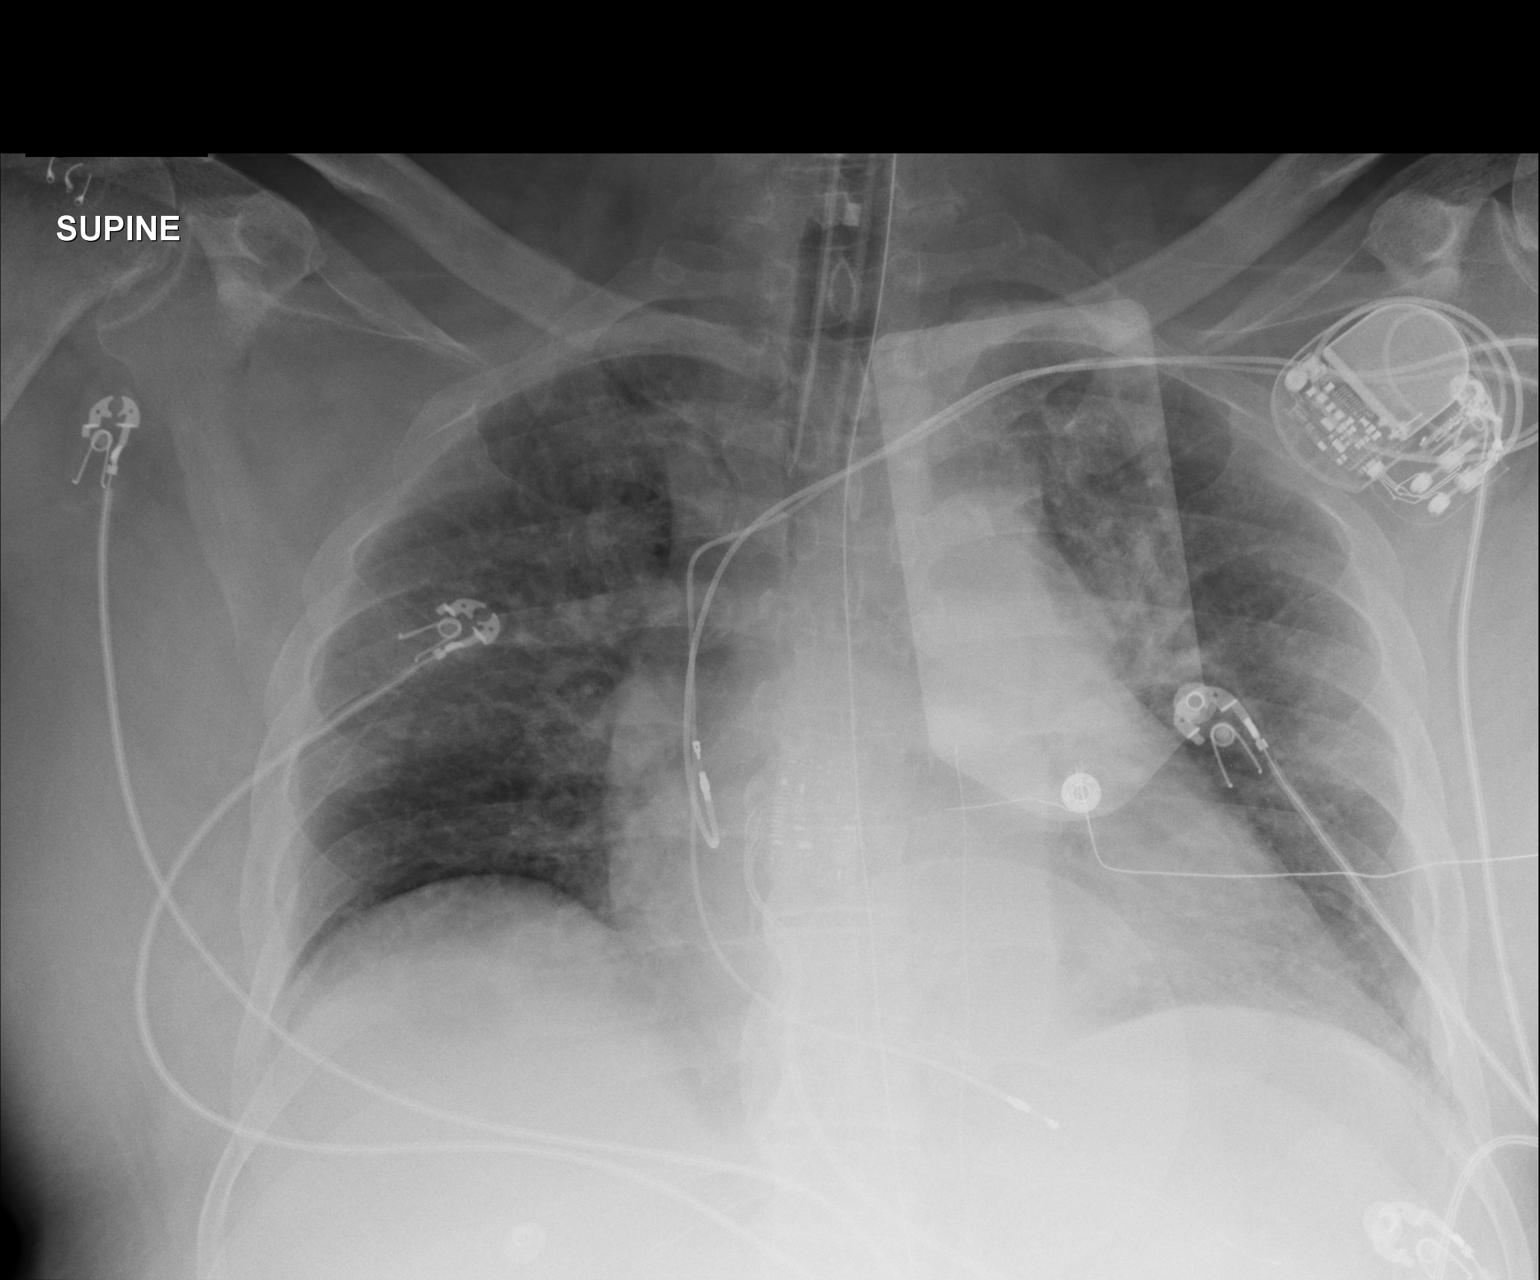

[1 of 1 positions shown; findings below may reference images not displayed]

FINDINGS: Endotracheal tube tip is 4.4 cm above the carina. Nasogastric tube
tip and side port below the diaphragm. Pacemaker leads are attached
to the right atrium and right ventricle. No pneumothorax. There is
no edema or consolidation. Heart is mildly enlarged with pulmonary
vascularity within normal limits. No adenopathy. No bone lesions.
IMPRESSION: Tube positions as described without pneumothorax. No edema or
consolidation. Mild cardiomegaly.

## 2015-12-02 IMAGING — CR DG ABD PORTABLE 1V
1 series · 1 of 1 positions shown · non-contrast
Comparison: None.

CLINICAL DATA: Unresponsive, CPR administered, NG tube placement

EXAM:
PORTABLE ABDOMEN - 1 VIEW

[AP]
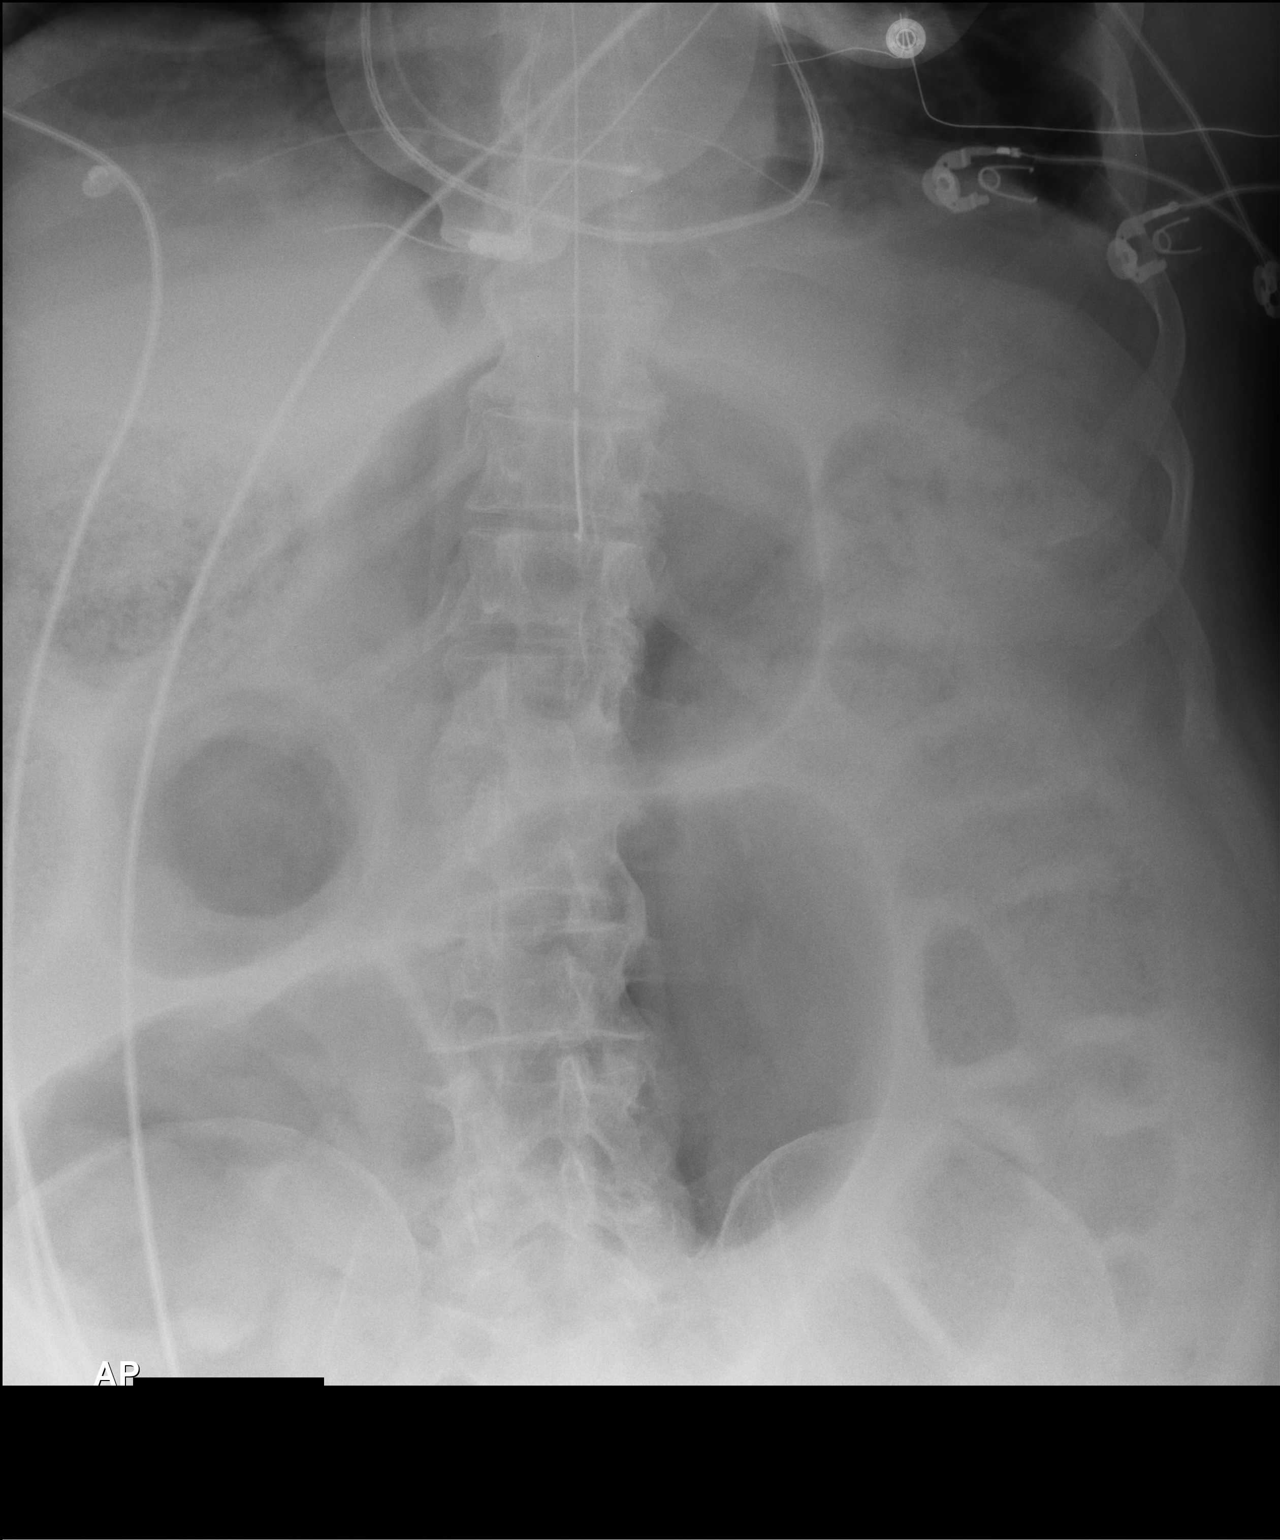

[1 of 1 positions shown; findings below may reference images not displayed]

FINDINGS: Enteric tube terminates in the proximal gastric body.

Nonobstructive bowel gas pattern.
IMPRESSION: Enteric tube terminates in the proximal gastric body.
